# Patient Record
Sex: Female | Born: 1935 | Race: White | Hispanic: No | Smoking: Never smoker
Health system: Southern US, Community
[De-identification: ages and names within clinical notes are randomized; demographics above are authoritative.]

## PROBLEM LIST (undated history)

## (undated) DIAGNOSIS — D649 Anemia, unspecified: Secondary | ICD-10-CM

## (undated) DIAGNOSIS — E669 Obesity, unspecified: Secondary | ICD-10-CM

## (undated) DIAGNOSIS — I82402 Acute embolism and thrombosis of unspecified deep veins of left lower extremity: Secondary | ICD-10-CM

## (undated) DIAGNOSIS — C801 Malignant (primary) neoplasm, unspecified: Secondary | ICD-10-CM

## (undated) DIAGNOSIS — I781 Nevus, non-neoplastic: Secondary | ICD-10-CM

## (undated) DIAGNOSIS — N200 Calculus of kidney: Secondary | ICD-10-CM

## (undated) DIAGNOSIS — R58 Hemorrhage, not elsewhere classified: Secondary | ICD-10-CM

## (undated) DIAGNOSIS — I10 Essential (primary) hypertension: Secondary | ICD-10-CM

## (undated) DIAGNOSIS — M109 Gout, unspecified: Secondary | ICD-10-CM

## (undated) DIAGNOSIS — F329 Major depressive disorder, single episode, unspecified: Secondary | ICD-10-CM

## (undated) DIAGNOSIS — F32A Depression, unspecified: Secondary | ICD-10-CM

## (undated) DIAGNOSIS — K219 Gastro-esophageal reflux disease without esophagitis: Secondary | ICD-10-CM

## (undated) DIAGNOSIS — I823 Embolism and thrombosis of renal vein: Secondary | ICD-10-CM

## (undated) DIAGNOSIS — E875 Hyperkalemia: Secondary | ICD-10-CM

## (undated) DIAGNOSIS — E7800 Pure hypercholesterolemia, unspecified: Secondary | ICD-10-CM

## (undated) DIAGNOSIS — I739 Peripheral vascular disease, unspecified: Secondary | ICD-10-CM

## (undated) DIAGNOSIS — M1990 Unspecified osteoarthritis, unspecified site: Secondary | ICD-10-CM

## (undated) HISTORY — PX: TONSILLECTOMY: SUR1361

## (undated) HISTORY — PX: RIGHT OOPHORECTOMY: SHX2359

## (undated) HISTORY — PX: HEMORRHOID SURGERY: SHX153

## (undated) HISTORY — PX: EYE SURGERY: SHX253

## (undated) HISTORY — DX: Calculus of kidney: N20.0

## (undated) HISTORY — DX: Hemorrhage, not elsewhere classified: R58

## (undated) HISTORY — PX: ECTOPIC PREGNANCY SURGERY: SHX613

## (undated) HISTORY — DX: Pure hypercholesterolemia, unspecified: E78.00

## (undated) HISTORY — PX: OTHER SURGICAL HISTORY: SHX169

## (undated) HISTORY — PX: DENTAL SURGERY: SHX609

## (undated) HISTORY — DX: Gout, unspecified: M10.9

## (undated) HISTORY — PX: SKIN CANCER EXCISION: SHX779

## (undated) HISTORY — DX: Embolism and thrombosis of renal vein: I82.3

## (undated) HISTORY — PX: CARDIAC CATHETERIZATION: SHX172

---

## 2005-03-26 ENCOUNTER — Ambulatory Visit: Payer: Self-pay | Admitting: Physician Assistant

## 2005-03-31 ENCOUNTER — Other Ambulatory Visit: Payer: Self-pay

## 2005-04-23 ENCOUNTER — Inpatient Hospital Stay: Payer: Self-pay | Admitting: Unknown Physician Specialty

## 2005-05-06 ENCOUNTER — Other Ambulatory Visit: Payer: Self-pay

## 2005-05-06 ENCOUNTER — Inpatient Hospital Stay: Payer: Self-pay | Admitting: Internal Medicine

## 2005-07-07 ENCOUNTER — Ambulatory Visit: Payer: Self-pay | Admitting: Physician Assistant

## 2005-07-18 ENCOUNTER — Ambulatory Visit: Payer: Self-pay | Admitting: Unknown Physician Specialty

## 2006-05-28 ENCOUNTER — Ambulatory Visit: Payer: Self-pay | Admitting: Gastroenterology

## 2007-08-05 ENCOUNTER — Ambulatory Visit: Payer: Self-pay | Admitting: Physician Assistant

## 2007-08-19 HISTORY — PX: FRACTURE SURGERY: SHX138

## 2007-09-14 ENCOUNTER — Ambulatory Visit: Payer: Self-pay | Admitting: Physician Assistant

## 2007-09-14 ENCOUNTER — Ambulatory Visit: Payer: Self-pay | Admitting: Internal Medicine

## 2007-10-28 ENCOUNTER — Ambulatory Visit: Payer: Self-pay | Admitting: Unknown Physician Specialty

## 2007-12-24 ENCOUNTER — Ambulatory Visit: Payer: Self-pay | Admitting: Internal Medicine

## 2007-12-31 ENCOUNTER — Ambulatory Visit: Payer: Self-pay | Admitting: Internal Medicine

## 2008-01-03 ENCOUNTER — Ambulatory Visit: Payer: Self-pay | Admitting: Internal Medicine

## 2008-07-18 ENCOUNTER — Ambulatory Visit: Payer: Self-pay | Admitting: Internal Medicine

## 2008-08-11 ENCOUNTER — Inpatient Hospital Stay: Payer: Self-pay | Admitting: Orthopedic Surgery

## 2008-08-17 ENCOUNTER — Encounter: Payer: Self-pay | Admitting: Internal Medicine

## 2008-08-18 ENCOUNTER — Ambulatory Visit: Payer: Self-pay | Admitting: Internal Medicine

## 2008-08-18 ENCOUNTER — Encounter: Payer: Self-pay | Admitting: Internal Medicine

## 2008-11-27 ENCOUNTER — Ambulatory Visit: Payer: Self-pay | Admitting: Orthopedic Surgery

## 2008-11-28 ENCOUNTER — Ambulatory Visit: Payer: Self-pay | Admitting: Orthopedic Surgery

## 2008-12-06 ENCOUNTER — Ambulatory Visit: Payer: Self-pay | Admitting: Orthopedic Surgery

## 2008-12-06 ENCOUNTER — Inpatient Hospital Stay: Payer: Self-pay | Admitting: Internal Medicine

## 2009-01-03 ENCOUNTER — Emergency Department: Payer: Self-pay | Admitting: Internal Medicine

## 2009-01-08 ENCOUNTER — Inpatient Hospital Stay: Payer: Self-pay | Admitting: Vascular Surgery

## 2010-01-04 ENCOUNTER — Ambulatory Visit: Payer: Self-pay | Admitting: Ophthalmology

## 2010-01-16 ENCOUNTER — Ambulatory Visit: Payer: Self-pay | Admitting: Ophthalmology

## 2010-08-15 ENCOUNTER — Ambulatory Visit: Payer: Self-pay | Admitting: Ophthalmology

## 2010-08-26 ENCOUNTER — Ambulatory Visit: Payer: Self-pay | Admitting: Ophthalmology

## 2011-12-17 ENCOUNTER — Ambulatory Visit: Payer: Self-pay | Admitting: Unknown Physician Specialty

## 2011-12-29 ENCOUNTER — Other Ambulatory Visit: Payer: Self-pay | Admitting: Neurosurgery

## 2011-12-30 ENCOUNTER — Encounter (HOSPITAL_COMMUNITY): Payer: Self-pay | Admitting: *Deleted

## 2011-12-30 NOTE — Progress Notes (Signed)
I asked patient to bring a list of medications to the hospital.

## 2011-12-31 ENCOUNTER — Encounter (HOSPITAL_COMMUNITY): Payer: Self-pay | Admitting: Critical Care Medicine

## 2011-12-31 ENCOUNTER — Ambulatory Visit (HOSPITAL_COMMUNITY): Payer: Medicare Other | Admitting: Critical Care Medicine

## 2011-12-31 ENCOUNTER — Encounter (HOSPITAL_COMMUNITY): Payer: Self-pay | Admitting: *Deleted

## 2011-12-31 ENCOUNTER — Encounter (HOSPITAL_COMMUNITY)
Admission: RE | Disposition: A | Discharge status: Home / Self Care | Payer: Self-pay | Source: Ambulatory Visit | Attending: Neurosurgery

## 2011-12-31 ENCOUNTER — Ambulatory Visit (HOSPITAL_COMMUNITY): Payer: Medicare Other

## 2011-12-31 ENCOUNTER — Ambulatory Visit (HOSPITAL_COMMUNITY)
Admission: RE | Admit: 2011-12-31 | Discharge: 2012-01-01 | Disposition: A | Discharge status: Home / Self Care | Payer: Medicare Other | Source: Ambulatory Visit | Attending: Neurosurgery | Admitting: Neurosurgery

## 2011-12-31 DIAGNOSIS — F329 Major depressive disorder, single episode, unspecified: Secondary | ICD-10-CM | POA: Insufficient documentation

## 2011-12-31 DIAGNOSIS — M8448XA Pathological fracture, other site, initial encounter for fracture: Secondary | ICD-10-CM | POA: Insufficient documentation

## 2011-12-31 DIAGNOSIS — Z23 Encounter for immunization: Secondary | ICD-10-CM | POA: Insufficient documentation

## 2011-12-31 DIAGNOSIS — F3289 Other specified depressive episodes: Secondary | ICD-10-CM | POA: Insufficient documentation

## 2011-12-31 DIAGNOSIS — K219 Gastro-esophageal reflux disease without esophagitis: Secondary | ICD-10-CM | POA: Insufficient documentation

## 2011-12-31 DIAGNOSIS — M810 Age-related osteoporosis without current pathological fracture: Secondary | ICD-10-CM | POA: Insufficient documentation

## 2011-12-31 DIAGNOSIS — I10 Essential (primary) hypertension: Secondary | ICD-10-CM | POA: Insufficient documentation

## 2011-12-31 DIAGNOSIS — M129 Arthropathy, unspecified: Secondary | ICD-10-CM | POA: Insufficient documentation

## 2011-12-31 DIAGNOSIS — D649 Anemia, unspecified: Secondary | ICD-10-CM | POA: Insufficient documentation

## 2011-12-31 HISTORY — DX: Major depressive disorder, single episode, unspecified: F32.9

## 2011-12-31 HISTORY — DX: Unspecified osteoarthritis, unspecified site: M19.90

## 2011-12-31 HISTORY — DX: Anemia, unspecified: D64.9

## 2011-12-31 HISTORY — DX: Essential (primary) hypertension: I10

## 2011-12-31 HISTORY — DX: Acute embolism and thrombosis of unspecified deep veins of left lower extremity: I82.402

## 2011-12-31 HISTORY — DX: Malignant (primary) neoplasm, unspecified: C80.1

## 2011-12-31 HISTORY — PX: VERTEBROPLASTY: SHX113

## 2011-12-31 HISTORY — DX: Gastro-esophageal reflux disease without esophagitis: K21.9

## 2011-12-31 HISTORY — DX: Depression, unspecified: F32.A

## 2011-12-31 LAB — SURGICAL PCR SCREEN: Staphylococcus aureus: NEGATIVE

## 2011-12-31 LAB — CBC
MCV: 96.6 fL (ref 78.0–100.0)
Platelets: 179 10*3/uL (ref 150–400)
RBC: 3.5 MIL/uL — ABNORMAL LOW (ref 3.87–5.11)
RDW: 13.6 % (ref 11.5–15.5)
WBC: 5.2 10*3/uL (ref 4.0–10.5)

## 2011-12-31 LAB — BASIC METABOLIC PANEL
CO2: 27 mEq/L (ref 19–32)
Calcium: 9.7 mg/dL (ref 8.4–10.5)
Chloride: 96 mEq/L (ref 96–112)
GFR calc Af Amer: 55 mL/min — ABNORMAL LOW (ref >90)
Sodium: 134 mEq/L — ABNORMAL LOW (ref 135–145)

## 2011-12-31 SURGERY — VERTEBROPLASTY
Anesthesia: General | Site: Back | Wound class: Clean

## 2011-12-31 MED ORDER — ONDANSETRON HCL 4 MG/2ML IJ SOLN
INTRAMUSCULAR | Status: DC | PRN
  Administered 2011-12-31: 4 mg via INTRAVENOUS

## 2011-12-31 MED ORDER — FUROSEMIDE 20 MG PO TABS
20.0000 mg | ORAL_TABLET | Freq: Every day | ORAL | Status: DC
  Administered 2012-01-01: 20 mg via ORAL
  Filled 2011-12-31 (×2): qty 1

## 2011-12-31 MED ORDER — HYDROMORPHONE HCL PF 1 MG/ML IJ SOLN
INTRAMUSCULAR | Status: AC
  Filled 2011-12-31: qty 1

## 2011-12-31 MED ORDER — MUPIROCIN 2 % EX OINT
TOPICAL_OINTMENT | Freq: Once | CUTANEOUS | Status: AC
  Administered 2011-12-31: 10:00:00 via NASAL

## 2011-12-31 MED ORDER — MUPIROCIN 2 % EX OINT
TOPICAL_OINTMENT | CUTANEOUS | Status: AC
  Filled 2011-12-31: qty 22

## 2011-12-31 MED ORDER — RAMIPRIL 10 MG PO CAPS
10.0000 mg | ORAL_CAPSULE | Freq: Every day | ORAL | Status: DC
  Administered 2011-12-31 – 2012-01-01 (×2): 10 mg via ORAL
  Filled 2011-12-31 (×2): qty 1

## 2011-12-31 MED ORDER — CEFAZOLIN SODIUM 1-5 GM-% IV SOLN
INTRAVENOUS | Status: AC
  Filled 2011-12-31: qty 50

## 2011-12-31 MED ORDER — GLYCOPYRROLATE 0.2 MG/ML IJ SOLN
INTRAMUSCULAR | Status: DC | PRN
  Administered 2011-12-31: .6 mg via INTRAVENOUS

## 2011-12-31 MED ORDER — HYDROCODONE-ACETAMINOPHEN 5-325 MG PO TABS
ORAL_TABLET | ORAL | Status: AC
  Administered 2011-12-31: 1 via ORAL
  Filled 2011-12-31: qty 1

## 2011-12-31 MED ORDER — 0.9 % SODIUM CHLORIDE (POUR BTL) OPTIME
TOPICAL | Status: DC | PRN
  Administered 2011-12-31: 1000 mL

## 2011-12-31 MED ORDER — MUPIROCIN 2 % EX OINT: TOPICAL_OINTMENT | Freq: Two times a day (BID) | CUTANEOUS | Status: DC

## 2011-12-31 MED ORDER — NEOSTIGMINE METHYLSULFATE 1 MG/ML IJ SOLN
INTRAMUSCULAR | Status: DC | PRN
  Administered 2011-12-31: 3 mg via INTRAVENOUS

## 2011-12-31 MED ORDER — IOHEXOL 300 MG/ML  SOLN
INTRAMUSCULAR | Status: DC | PRN
  Administered 2011-12-31: 50 mL

## 2011-12-31 MED ORDER — HYDROCODONE-ACETAMINOPHEN 5-325 MG PO TABS
1.0000 | ORAL_TABLET | Freq: Once | ORAL | Status: AC
  Administered 2011-12-31: 1 via ORAL

## 2011-12-31 MED ORDER — LACTATED RINGERS IV SOLN
INTRAVENOUS | Status: DC | PRN
  Administered 2011-12-31 (×2): via INTRAVENOUS

## 2011-12-31 MED ORDER — HYDROMORPHONE HCL PF 1 MG/ML IJ SOLN
0.2500 mg | INTRAMUSCULAR | Status: DC | PRN
  Administered 2011-12-31 (×4): 0.25 mg via INTRAVENOUS

## 2011-12-31 MED ORDER — CEFAZOLIN SODIUM 1-5 GM-% IV SOLN
1.0000 g | INTRAVENOUS | Status: AC
  Administered 2011-12-31: 1 g via INTRAVENOUS

## 2011-12-31 MED ORDER — POTASSIUM CHLORIDE IN NACL 20-0.9 MEQ/L-% IV SOLN
INTRAVENOUS | Status: DC
  Filled 2011-12-31 (×3): qty 1000

## 2011-12-31 MED ORDER — LIDOCAINE-EPINEPHRINE 0.5-1:200000 % IJ SOLN
INTRAMUSCULAR | Status: DC | PRN
  Administered 2011-12-31: 5 mL

## 2011-12-31 MED ORDER — PANTOPRAZOLE SODIUM 40 MG PO TBEC
40.0000 mg | DELAYED_RELEASE_TABLET | Freq: Every day | ORAL | Status: DC
  Administered 2011-12-31 – 2012-01-01 (×2): 40 mg via ORAL
  Filled 2011-12-31 (×2): qty 1

## 2011-12-31 MED ORDER — LORAZEPAM 2 MG/ML IJ SOLN: 1.0000 mg | Freq: Once | INTRAMUSCULAR | Status: DC | PRN

## 2011-12-31 MED ORDER — MORPHINE SULFATE 2 MG/ML IJ SOLN
2.0000 mg | INTRAMUSCULAR | Status: DC | PRN
  Administered 2011-12-31: 1 mg via INTRAVENOUS
  Administered 2012-01-01: 2 mg via INTRAVENOUS
  Filled 2011-12-31 (×2): qty 1

## 2011-12-31 MED ORDER — HYDROCODONE-ACETAMINOPHEN 5-325 MG PO TABS
1.0000 | ORAL_TABLET | ORAL | Status: DC | PRN
  Administered 2012-01-01 (×2): 1 via ORAL
  Filled 2011-12-31 (×2): qty 1

## 2011-12-31 SURGICAL SUPPLY — 38 items
BLADE SURG 15 STRL LF DISP TIS (BLADE) ×1 IMPLANT
BLADE SURG 15 STRL SS (BLADE) ×1
BLADE SURG ROTATE 9660 (MISCELLANEOUS) IMPLANT
CEMENT BONE KYPHX HV R (Orthopedic Implant) ×2 IMPLANT
CLOTH BEACON ORANGE TIMEOUT ST (SAFETY) ×2 IMPLANT
CONT SPEC 4OZ CLIKSEAL STRL BL (MISCELLANEOUS) ×2 IMPLANT
DERMABOND ADVANCED (GAUZE/BANDAGES/DRESSINGS) ×1
DERMABOND ADVANCED .7 DNX12 (GAUZE/BANDAGES/DRESSINGS) ×1 IMPLANT
DRAPE C-ARM 42X72 X-RAY (DRAPES) ×2 IMPLANT
DRAPE LAPAROTOMY 100X72X124 (DRAPES) ×2 IMPLANT
DRAPE PROXIMA HALF (DRAPES) ×2 IMPLANT
DRAPE SURG 17X23 STRL (DRAPES) ×2 IMPLANT
DRESSING TELFA 8X3 (GAUZE/BANDAGES/DRESSINGS) IMPLANT
DURAPREP 26ML APPLICATOR (WOUND CARE) ×2 IMPLANT
GAUZE SPONGE 4X4 16PLY XRAY LF (GAUZE/BANDAGES/DRESSINGS) ×2 IMPLANT
GLOVE BIOGEL PI IND STRL 7.5 (GLOVE) ×2 IMPLANT
GLOVE BIOGEL PI INDICATOR 7.5 (GLOVE) ×2
GLOVE ECLIPSE 6.5 STRL STRAW (GLOVE) ×2 IMPLANT
GLOVE ECLIPSE 7.5 STRL STRAW (GLOVE) ×6 IMPLANT
GLOVE EXAM NITRILE LRG STRL (GLOVE) IMPLANT
GLOVE EXAM NITRILE MD LF STRL (GLOVE) IMPLANT
GLOVE EXAM NITRILE XL STR (GLOVE) IMPLANT
GLOVE EXAM NITRILE XS STR PU (GLOVE) IMPLANT
GOWN BRE IMP SLV AUR LG STRL (GOWN DISPOSABLE) ×4 IMPLANT
GOWN BRE IMP SLV AUR XL STRL (GOWN DISPOSABLE) IMPLANT
GOWN STRL REIN 2XL LVL4 (GOWN DISPOSABLE) IMPLANT
KIT BASIN OR (CUSTOM PROCEDURE TRAY) ×2 IMPLANT
KIT ROOM TURNOVER OR (KITS) ×2 IMPLANT
NEEDLE HYPO 25X1 1.5 SAFETY (NEEDLE) ×2 IMPLANT
NS IRRIG 1000ML POUR BTL (IV SOLUTION) ×2 IMPLANT
PACK SURGICAL SETUP 50X90 (CUSTOM PROCEDURE TRAY) ×2 IMPLANT
PAD ARMBOARD 7.5X6 YLW CONV (MISCELLANEOUS) ×6 IMPLANT
SPECIMEN JAR SMALL (MISCELLANEOUS) IMPLANT
STAPLER SKIN PROX WIDE 3.9 (STAPLE) ×2 IMPLANT
SUT VIC AB 3-0 SH 8-18 (SUTURE) ×2 IMPLANT
SYR CONTROL 10ML LL (SYRINGE) ×4 IMPLANT
TOWEL OR 17X24 6PK STRL BLUE (TOWEL DISPOSABLE) ×2 IMPLANT
TOWEL OR 17X26 10 PK STRL BLUE (TOWEL DISPOSABLE) ×2 IMPLANT

## 2011-12-31 NOTE — Progress Notes (Addendum)
Patient has healing ulcer on right lower leg. Being followed by Dr. Orson Aloe in [place]. No drainage or open areas noted. Primary physician - Dr.Charlene Lorin Picket - EKG requested asap Cardiologist - Dr. Hazle Nordmann with [facility] in [place]. EKG, Stress Test, Cardiac Cath within last 5 years - records requested asap.  EKG and cardiac notes reviewed by Shonna Chock PA. No new orders received.   Kayla Galloway

## 2011-12-31 NOTE — Anesthesia Procedure Notes (Signed)
Procedure Name: Intubation Date/Time: 12/31/2011 4:29 PM Performed by: Gwenyth Allegra Pre-anesthesia Checklist: Patient identified, Timeout performed, Emergency Drugs available and Suction available Patient Re-evaluated:Patient Re-evaluated prior to inductionOxygen Delivery Method: Circle system utilized Preoxygenation: Pre-oxygenation with 100% oxygen Intubation Type: IV induction Ventilation: Oral airway inserted - appropriate to patient size Laryngoscope Size: Mac and 4 Grade View: Grade II Tube type: Oral Tube size: 7.0 mm Number of attempts: 1 Airway Equipment and Method: Stylet Placement Confirmation: ETT inserted through vocal cords under direct vision,  breath sounds checked- equal and bilateral and positive ETCO2 Secured at: 21 cm Tube secured with: Tape Dental Injury: Teeth and Oropharynx as per pre-operative assessment

## 2011-12-31 NOTE — Transfer of Care (Signed)
Immediate Anesthesia Transfer of Care Note  Patient: Kayla Galloway  Procedure(s) Performed: Procedure(s) (LRB): VERTEBROPLASTY (N/A)  Patient Location: PACU  Anesthesia Type: General  Level of Consciousness: awake and alert   Airway & Oxygen Therapy: Patient Spontanous Breathing and Patient connected to nasal cannula oxygen  Post-op Assessment: Report given to PACU RN and Post -op Vital signs reviewed and stable  Post vital signs: Reviewed and stable  Complications: No apparent anesthesia complications

## 2011-12-31 NOTE — Anesthesia Postprocedure Evaluation (Signed)
  Anesthesia Post-op Note  Patient: Kayla Galloway  Procedure(s) Performed: Procedure(s) (LRB): VERTEBROPLASTY (N/A)  Patient Location: PACU  Anesthesia Type: General  Level of Consciousness: awake, alert  and oriented  Airway and Oxygen Therapy: Patient Spontanous Breathing and Patient connected to nasal cannula oxygen  Post-op Pain: mild  Post-op Assessment: Post-op Vital signs reviewed, Patient's Cardiovascular Status Stable, Respiratory Function Stable, Patent Airway, No signs of Nausea or vomiting and Pain level controlled  Post-op Vital Signs: Reviewed and stable  Complications: No apparent anesthesia complications

## 2011-12-31 NOTE — H&P (Signed)
Kayla Galloway is an 78 y.o. female.   Chief Complaint: Back pain, T11 compression fracture HPI: pain in lower back since 2/24 when she heard a pop in her back. Conservative treatment unhelpful. Mri showed a subacute compression fracture at T11.  Past Medical History  Diagnosis Date  . Hypertension   . Arthritis   . Osteoporosis   . Blood transfusion   . Anemia   . GERD (gastroesophageal reflux disease)   . DVT (deep venous thrombosis), left     bil  . Depression   . Cancer     head    Past Surgical History  Procedure Date  . Fracture surgery     hip  . Right oophorectomy     partial  . Eye surgery     bil eyes  . Cardiac catheterization   . Colonsopy   . Tonsillectomy   . Skin cancer excision     top of head  . Ivc filter     pt states it has turned side ways and could not be removed.    Family History  Problem Relation Age of Onset  . Anesthesia problems Neg Hx    Social History:  reports that she has never smoked. She does not have any smokeless tobacco history on file. She reports that she does not drink alcohol or use illicit drugs.  Allergies:  Allergies  Allergen Reactions  . Doxycycline Nausea Only    Unable to eat, weight loss.  . Tramadol     Medications Prior to Admission  Medication Sig Dispense Refill  . Calcium Carbonate-Vitamin D (CALCIUM 600 + D PO) Take 1 tablet by mouth 3 (three) times daily.      . Cyanocobalamin (VITAMIN B-12 IJ) Inject 1,000 mcg as directed every 30 (thirty) days.      . furosemide (LASIX) 20 MG tablet Take 20 mg by mouth daily.      Marland Kitchen HYDROcodone-acetaminophen (NORCO) 5-325 MG per tablet Take 1 tablet by mouth every 4 (four) hours as needed. For pain      . omeprazole (PRILOSEC) 20 MG capsule Take 20 mg by mouth daily.      Marland Kitchen OVER THE COUNTER MEDICATION Take 1 tablet by mouth 2 (two) times daily. Osteo Bi-flex      . ramipril (ALTACE) 10 MG capsule Take 10 mg by mouth daily.        Results for orders placed during  the hospital encounter of 12/31/11 (from the past 48 hour(s))  BASIC METABOLIC PANEL     Status: Abnormal   Collection Time   12/31/11  9:09 AM      Component Value Range Comment   Sodium 134 (*) 135 - 145 (mEq/L)    Potassium 4.1  3.5 - 5.1 (mEq/L)    Chloride 96  96 - 112 (mEq/L)    CO2 27  19 - 32 (mEq/L)    Glucose, Bld 98  70 - 99 (mg/dL)    BUN 16  6 - 23 (mg/dL)    Creatinine, Ser 2.95  0.50 - 1.10 (mg/dL)    Calcium 9.7  8.4 - 10.5 (mg/dL)    GFR calc non Af Amer 48 (*) >90 (mL/min)    GFR calc Af Amer 55 (*) >90 (mL/min)   CBC     Status: Abnormal   Collection Time   12/31/11  9:09 AM      Component Value Range Comment   WBC 5.2  4.0 - 10.5 (K/uL)  RBC 3.50 (*) 3.87 - 5.11 (MIL/uL)    Hemoglobin 11.4 (*) 12.0 - 15.0 (g/dL)    HCT 16.1 (*) 09.6 - 46.0 (%)    MCV 96.6  78.0 - 100.0 (fL)    MCH 32.6  26.0 - 34.0 (pg)    MCHC 33.7  30.0 - 36.0 (g/dL)    RDW 04.5  40.9 - 81.1 (%)    Platelets 179  150 - 400 (K/uL)   SURGICAL PCR SCREEN     Status: Normal   Collection Time   12/31/11  9:10 AM      Component Value Range Comment   MRSA, PCR NEGATIVE  NEGATIVE     Staphylococcus aureus NEGATIVE  NEGATIVE     Dg Chest 2 View  12/31/2011  *RADIOLOGY REPORT*  Clinical Data: Preoperative respiratory evaluation.  CHEST - 2 VIEW  Comparison: None.  Findings: Hyperexpansion is consistent with emphysema. The lungs are clear without focal infiltrate, edema, pneumothorax or pleural effusion.  Pleuroparenchymal scarring is noted in both apices. The cardiopericardial silhouette is enlarged.  Multiple thoracic compression fractures are identified.  Evidence of prior vertebral augmentation noted at L2.  There is moderate compression deformity at L1.  Moderate to severe compression deformities seen at T11 and T12.  Moderate compression deformity noted at T7 and T10.  IMPRESSION: Cardiomegaly with emphysema.  No acute cardiopulmonary process.  Multilevel compression fractures in the thoracolumbar  spine.  Original Report Authenticated By: ERIC A. MANSELL, M.D.    ROS  Blood pressure 156/76, pulse 79, temperature 97.3 F (36.3 C), temperature source Oral, resp. rate 16, height 5' 2.5" (1.588 m), weight 52.164 kg (115 lb), SpO2 99.00%. Physical Exam alert and oriented x 4 Moving all extremities, no focal weakness Cranial nerves, decreased hearing bilaterally Symmetric facies Tongue and uvula midline   Assessment/Plan Or for kyphoplasty of T11  Haylo Fake L 12/31/2011, 4:23 PM

## 2011-12-31 NOTE — Preoperative (Signed)
Beta Blockers   Reason not to administer Beta Blockers:Not Applicable 

## 2011-12-31 NOTE — Op Note (Signed)
12/31/2011  5:42 PM  PATIENT:  Sharlyne Pacas  78 y.o. female with severe back pain which I believe is due to a T11 compression fracture  PRE-OPERATIVE DIAGNOSIS:  T11 compression fracture  POST-OPERATIVE DIAGNOSIS:  T11 compression fracturePROCEDURE:  Procedure(s): Kyphoplasty T11  SURGEON:  Surgeon(s): Carmela Hurt, MD  ASSISTANTS:none  ANESTHESIA:   general  EBL:  Total I/O In: 1000 [I.V.:1000] Out: -   BLOOD ADMINISTERED:none  CELL SAVER GIVEN:none  COUNT:per nursing  DRAINS: none   SPECIMEN:  No Specimen  DICTATION: Mrs. Streed was taken to the operating room and intubated and placed under a general anesthetic without difficulty. She was positioned on the [place] table prone with all pressure points properly padded. I localized the T11 vertebrae with fluoroscopy before prepping to make sure the fluoroscopy units were in the correct position. I prepped and draped Mrs. Muralles in a sterile manner. I with fluoroscopic guidance placed a trocar into the left pedicle of the T11 body via a stab incision. I was able to advance it through very soft bone past midline. I placed the kyphoplasty ballon and inflated it to create a cavity to accept the methylmethracylate. I placed the cement and achieved a good fill under fluoroscopic guidance. I removed the trocar and the final xray looked like there was no adverse placement. I used one stitch to close the stab hole I used for the trocar. I used dermabond for a sterile dressing.  PLAN OF CARE: Admit for overnight observation  PATIENT DISPOSITION:  PACU - hemodynamically stable.   Delay start of Pharmacological VTE agent (>24hrs) due to surgical blood loss or risk of bleeding:  yes

## 2011-12-31 NOTE — Anesthesia Preprocedure Evaluation (Signed)
Anesthesia Evaluation  Patient identified by MRN, date of birth, ID band Patient awake    Reviewed: Allergy & Precautions, H&P , NPO status , Patient's Chart, lab work & pertinent test results  Airway Mallampati: I TM Distance: >3 FB Neck ROM: Full    Dental   Pulmonary    Pulmonary exam normal       Cardiovascular hypertension,     Neuro/Psych Depression    GI/Hepatic GERD-  ,  Endo/Other    Renal/GU      Musculoskeletal   Abdominal   Peds  Hematology   Anesthesia Other Findings   Reproductive/Obstetrics                           Anesthesia Physical Anesthesia Plan  ASA: II  Anesthesia Plan: General   Post-op Pain Management:    Induction: Intravenous  Airway Management Planned: Oral ETT  Additional Equipment:   Intra-op Plan:   Post-operative Plan: Extubation in OR  Informed Consent: I have reviewed the patients History and Physical, chart, labs and discussed the procedure including the risks, benefits and alternatives for the proposed anesthesia with the patient or authorized representative who has indicated his/her understanding and acceptance.     Plan Discussed with: CRNA and Surgeon  Anesthesia Plan Comments:         Anesthesia Quick Evaluation

## 2012-01-01 ENCOUNTER — Encounter (HOSPITAL_COMMUNITY): Payer: Self-pay | Admitting: Neurosurgery

## 2012-01-01 MED ORDER — PNEUMOCOCCAL VAC POLYVALENT 25 MCG/0.5ML IJ INJ
0.5000 mL | INJECTION | INTRAMUSCULAR | Status: AC
  Administered 2012-01-01: 0.5 mL via INTRAMUSCULAR
  Filled 2012-01-01: qty 0.5

## 2012-01-01 NOTE — Discharge Summary (Addendum)
Physician Discharge Summary  Patient ID: Kayla Galloway MRN: [id number] DOB/AGE: 76-Aug-1922 76 y.o.  Admit date: 12/31/2011 Discharge date: 01/01/2012  Admission Diagnoses:t11 compression fracture  Discharge Diagnoses: T11 compression fracture Active Problems:  * No active hospital problems. *    Discharged Condition: good  Hospital Course: [name] was admitted and taken to the operating room for a kyphoplasty. She has done well post procedure. She is voiding, ambulating and tolerating a regular diet. Wound is clean and dry.  Consults: None  Significant Diagnostic Studies:   Treatments: T11 kyphoplasty from the Left.  Discharge Exam: Blood pressure 142/76, pulse 68, temperature 97.5 F (36.4 C), temperature source Oral, resp. rate 16, height 5' 2.5" (1.588 m), weight 52.164 kg (115 lb), SpO2 97.00%. General appearance: alert, cooperative and appears stated age Neurologic: Alert and oriented X 3, normal strength and tone. Normal symmetric reflexes. Normal coordination and gait  Disposition: Final discharge disposition not confirmed   Medication List  As of 01/01/2012  2:52 PM   TAKE these medications         CALCIUM 600 + D PO   Take 1 tablet by mouth 3 (three) times daily.      furosemide 20 MG tablet   Commonly known as: LASIX   Take 20 mg by mouth daily.      HYDROcodone-acetaminophen 5-325 MG per tablet   Commonly known as: NORCO   Take 1 tablet by mouth every 4 (four) hours as needed. For pain      omeprazole 20 MG capsule   Commonly known as: PRILOSEC   Take 20 mg by mouth daily.      OVER THE COUNTER MEDICATION   Take 1 tablet by mouth 2 (two) times daily. Osteo Bi-flex      ramipril 10 MG capsule   Commonly known as: ALTACE   Take 10 mg by mouth daily.      VITAMIN B-12 IJ   Inject 1,000 mcg as directed every 30 (thirty) days.             Signed: Azuri Bozard L 01/01/2012, 2:52 PM

## 2012-01-01 NOTE — Discharge Instructions (Signed)
Vertebroplasty Care After A vertebroplasty is procedure that helps relieve pain caused by breaks (fractures) in the spine. A special kind of "bone cement" is put into the spine. The cement hardens quickly. It also helps the break stay in a steady state (stabilizes).  HOME CARE  Rest in your bed (bed rest) as told by your doctor.   Do not lift things over 8 pounds (3.6 kilograms) for the next few days.   Return to your normal routine as told by your doctor.   Only take medicine as told by your doctor.   Change bandages (dressings) as told by your doctor.  GET HELP RIGHT AWAY IF:   You are bleeding from the needle site.   You have puffiness (swelling) or redness at the needle site.   You are very sore near the needle site.   Yellowish white fluid (pus) is coming out of the needle site.   You have a temperature by mouth above 102 F (38.9 C), not controlled by medicine.   You have trouble breathing or you feel short of breath.   You throw up (vomit).   You feel sick to your stomach (nauseous).   You have pain or cramping in your belly (abdomen), and it will not stop.  If you go to the Emergency Room, tell the nurse that you had this procedure done. MAKE SURE YOU:  Understand these instructions.   Will watch your condition.   Will get help right away if you are not doing well or get worse.  Document Released: 10/29/2009 Document Revised: 07/24/2011 Document Reviewed: 01/08/2011 Exit[employer] Patient Information 2012 [employer]. 

## 2012-01-07 ENCOUNTER — Encounter (HOSPITAL_COMMUNITY): Payer: Self-pay

## 2012-06-28 ENCOUNTER — Ambulatory Visit (INDEPENDENT_AMBULATORY_CARE_PROVIDER_SITE_OTHER): Payer: Medicare Other | Admitting: Internal Medicine

## 2012-06-28 ENCOUNTER — Encounter: Payer: Self-pay | Admitting: Internal Medicine

## 2012-06-28 VITALS — BP 140/64 | HR 76 | Temp 98.7°F | Ht 62.75 in | Wt 116.5 lb

## 2012-06-28 DIAGNOSIS — D649 Anemia, unspecified: Secondary | ICD-10-CM

## 2012-06-28 DIAGNOSIS — E7800 Pure hypercholesterolemia, unspecified: Secondary | ICD-10-CM | POA: Insufficient documentation

## 2012-06-28 DIAGNOSIS — I10 Essential (primary) hypertension: Secondary | ICD-10-CM

## 2012-06-28 DIAGNOSIS — M810 Age-related osteoporosis without current pathological fracture: Secondary | ICD-10-CM | POA: Insufficient documentation

## 2012-06-28 DIAGNOSIS — R5383 Other fatigue: Secondary | ICD-10-CM

## 2012-06-28 DIAGNOSIS — R5381 Other malaise: Secondary | ICD-10-CM

## 2012-06-28 DIAGNOSIS — N289 Disorder of kidney and ureter, unspecified: Secondary | ICD-10-CM

## 2012-06-28 NOTE — Patient Instructions (Addendum)
It was nice seeing you today.  I am going to schedule you to return for labs. Let me know if you need anything.

## 2012-06-28 NOTE — Assessment & Plan Note (Signed)
Low cholesterol diet.  Desires no medication.  Follow.    

## 2012-06-28 NOTE — Progress Notes (Signed)
  Subjective:    Patient ID: Kayla Galloway, female    DOB: November 24, 1920, 77 y.o.   MRN: [id number]  HPI 78 year old female with past history of hypertension, gout and hypercholesterolemia.  S/P kyphoplasty x 2.  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  She also had a renal vein thrombosis with some renal insufficiency previously.  She comes in today for a scheduled follow up.  Accompanied by her sister.  History obtained from both of them.  She states she has been doing well.  Still with some increased back pain, but is moving much better.  Eating and drinking well.  No nausea or vomiting.  No bowel issues.    Past Medical History  Diagnosis Date  . Hypertension   . Arthritis   . Osteoporosis   . Hypercholesterolemia   . Anemia   . GERD (gastroesophageal reflux disease)   . DVT (deep venous thrombosis), left     bilateral, IVC filter 2010  . Depression   . Gout   . Nephrolithiasis   . 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 Renal vein thrombosis     previous renal insufficiency    Review of Systems Patient denies any headache, lightheadedness or dizziness.  No chest pain, tightness or palpitations.  No increased shortness of breath, cough or congestion.  No nausea or vomiting.  No acid reflux.  No abdominal pain or cramping.  No bowel change, such as diarrhea, constipation, BRBPR or melana.  No urine change.        Objective:   Physical Exam Filed Vitals:   06/28/12 1337  BP: 140/64  Pulse: 76  Temp: 98.7 F (70.9 C)   78 year old female in no acute distress.   HEENT:  Nares - clear.  OP- without lesions or erythema.  NECK:  Supple, nontender.  No audible bruit.   HEART:  Appears to be regular. LUNGS:  Without crackles or wheezing audible.  Respirations even and unlabored.   RADIAL PULSE:  Equal bilaterally.  ABDOMEN:  Soft, nontender.  No audible abdominal  bruit.   EXTREMITIES:  No increased edema to be present.                     Assessment & Plan:  MSK.  Seeing Dr Franky Macho.  Has follow up in 12/13.  Stable.    CARDIOVASCULAR.  Stress test 10/25/10 negative with EF 60%.  Saw cardiology.  Continue risk factor modification.   INCREASED PSYCHOSOCIAL STRESSORS.  Started on Zoloft 03/11/12.  Doing well.  Follow.   GYN.  Has seen Dr Harold Hedge.  Was supposed to follow up with her.  She declines any further w/up.    HEALTH MAINTENANCE.  Physical 12/08/11.  Colonoscopy 10/07 - normal.  She declines mammogram and further testing.

## 2012-06-28 NOTE — Assessment & Plan Note (Signed)
Bone density 06/30/08 revealed interval decrease.  Received IV Reclast in March 2011.  Continue calcium and vitamin D.  Desires not to receive Reclast.   

## 2012-06-28 NOTE — Assessment & Plan Note (Signed)
Cr has been stable at 1.1.  Follow.     

## 2012-06-28 NOTE — Assessment & Plan Note (Signed)
Blood pressure has been under reasonable control.  Follow.  Check metabolic panel.  

## 2012-06-28 NOTE — Assessment & Plan Note (Signed)
Hgb has been stable.  Still with elevated ferritin.  Hemochromatosis screening negative.  Have discussed hematology evaluation.  She has declined.  Colonoscopy 10/07 was normal.  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  She desires no further testing at this point.  Follow cbc.

## 2012-07-12 ENCOUNTER — Other Ambulatory Visit (INDEPENDENT_AMBULATORY_CARE_PROVIDER_SITE_OTHER): Payer: Medicare Other

## 2012-07-12 DIAGNOSIS — E7800 Pure hypercholesterolemia, unspecified: Secondary | ICD-10-CM

## 2012-07-12 DIAGNOSIS — R5383 Other fatigue: Secondary | ICD-10-CM

## 2012-07-12 DIAGNOSIS — R5381 Other malaise: Secondary | ICD-10-CM

## 2012-07-12 DIAGNOSIS — D649 Anemia, unspecified: Secondary | ICD-10-CM

## 2012-07-12 LAB — COMPREHENSIVE METABOLIC PANEL
AST: 29 U/L (ref 0–37)
Albumin: 4.1 g/dL (ref 3.5–5.2)
BUN: 20 mg/dL (ref 6–23)
CO2: 27 mEq/L (ref 19–32)
Calcium: 9.5 mg/dL (ref 8.4–10.5)
Chloride: 99 mEq/L (ref 96–112)
Glucose, Bld: 139 mg/dL — ABNORMAL HIGH (ref 70–99)
Potassium: 3.6 mEq/L (ref 3.5–5.1)

## 2012-07-12 LAB — FERRITIN: Ferritin: 338 ng/mL — ABNORMAL HIGH (ref 10.0–291.0)

## 2012-07-12 LAB — CBC WITH DIFFERENTIAL/PLATELET
Basophils Absolute: 0 10*3/uL (ref 0.0–0.1)
Eosinophils Absolute: 0 10*3/uL (ref 0.0–0.7)
HCT: 32.9 % — ABNORMAL LOW (ref 36.0–46.0)
Hemoglobin: 11 g/dL — ABNORMAL LOW (ref 12.0–15.0)
Lymphs Abs: 1.8 10*3/uL (ref 0.7–4.0)
MCHC: 33.3 g/dL (ref 30.0–36.0)
Monocytes Absolute: 0.5 10*3/uL (ref 0.1–1.0)
Neutro Abs: 3 10*3/uL (ref 1.4–7.7)
RDW: 15.1 % — ABNORMAL HIGH (ref 11.5–14.6)

## 2012-07-19 ENCOUNTER — Other Ambulatory Visit: Payer: Self-pay | Admitting: Internal Medicine

## 2012-07-19 DIAGNOSIS — D696 Thrombocytopenia, unspecified: Secondary | ICD-10-CM

## 2012-07-19 DIAGNOSIS — R739 Hyperglycemia, unspecified: Secondary | ICD-10-CM

## 2012-07-19 NOTE — Progress Notes (Signed)
I have placed order for labs.  Pt coming 07/26/12 at 9:00.  Please put on lab schedule.  Thanks.

## 2012-07-26 ENCOUNTER — Other Ambulatory Visit: Payer: Medicare Other

## 2012-07-29 ENCOUNTER — Other Ambulatory Visit (INDEPENDENT_AMBULATORY_CARE_PROVIDER_SITE_OTHER): Payer: Medicare Other

## 2012-07-29 DIAGNOSIS — R739 Hyperglycemia, unspecified: Secondary | ICD-10-CM

## 2012-07-29 DIAGNOSIS — D696 Thrombocytopenia, unspecified: Secondary | ICD-10-CM

## 2012-07-29 DIAGNOSIS — R7309 Other abnormal glucose: Secondary | ICD-10-CM

## 2012-07-29 LAB — CBC WITH DIFFERENTIAL/PLATELET
Basophils Absolute: 0 10*3/uL (ref 0.0–0.1)
Eosinophils Absolute: 0 10*3/uL (ref 0.0–0.7)
Hemoglobin: 11.2 g/dL — ABNORMAL LOW (ref 12.0–15.0)
Lymphocytes Relative: 28.9 % (ref 12.0–46.0)
Lymphs Abs: 1.4 10*3/uL (ref 0.7–4.0)
MCHC: 33.4 g/dL (ref 30.0–36.0)
Neutro Abs: 2.9 10*3/uL (ref 1.4–7.7)
RDW: 14.4 % (ref 11.5–14.6)

## 2012-08-04 LAB — CBC
HGB: 11 g/dL — ABNORMAL LOW (ref 12.0–16.0)
MCH: 33.9 pg (ref 26.0–34.0)
MCV: 98 fL (ref 80–100)
Platelet: 154 10*3/uL (ref 150–440)
RBC: 3.25 10*6/uL — ABNORMAL LOW (ref 3.80–5.20)
RDW: 13.9 % (ref 11.5–14.5)
WBC: 9.6 10*3/uL (ref 3.6–11.0)

## 2012-08-04 LAB — URINALYSIS, COMPLETE
Bilirubin,UR: NEGATIVE
Blood: NEGATIVE
Glucose,UR: NEGATIVE mg/dL (ref 0–75)
Ketone: NEGATIVE
Leukocyte Esterase: NEGATIVE
Nitrite: NEGATIVE
Ph: 6 (ref 4.5–8.0)
RBC,UR: <1 /HPF (ref 0–5)
Squamous Epithelial: NONE SEEN
WBC UR: 1 /HPF (ref 0–5)

## 2012-08-04 LAB — COMPREHENSIVE METABOLIC PANEL
Bilirubin,Total: 0.5 mg/dL (ref 0.2–1.0)
Chloride: 96 mmol/L — ABNORMAL LOW (ref 98–107)
EGFR (African American): 43 — ABNORMAL LOW
EGFR (Non-African Amer.): 37 — ABNORMAL LOW
Glucose: 90 mg/dL (ref 65–99)
Osmolality: 258 (ref 275–301)
Potassium: 4.3 mmol/L (ref 3.5–5.1)
SGPT (ALT): 22 U/L (ref 12–78)
Total Protein: 6.8 g/dL (ref 6.4–8.2)

## 2012-08-05 ENCOUNTER — Inpatient Hospital Stay: Payer: Self-pay | Admitting: Internal Medicine

## 2012-08-05 LAB — WBCS, STOOL

## 2012-08-05 LAB — HEMOGLOBIN: HGB: 9.7 g/dL — ABNORMAL LOW (ref 12.0–16.0)

## 2012-08-06 LAB — CBC WITH DIFFERENTIAL/PLATELET
Basophil #: 0 10*3/uL (ref 0.0–0.1)
Basophil %: 0.3 %
HGB: 9.8 g/dL — ABNORMAL LOW (ref 12.0–16.0)
MCH: 33.6 pg (ref 26.0–34.0)
MCHC: 34.7 g/dL (ref 32.0–36.0)
MCV: 97 fL (ref 80–100)
Monocyte #: 0.5 x10 3/mm (ref 0.2–0.9)
Monocyte %: 8.1 %
Neutrophil #: 4.5 10*3/uL (ref 1.4–6.5)
Neutrophil %: 67 %
RBC: 2.91 10*6/uL — ABNORMAL LOW (ref 3.80–5.20)
WBC: 6.8 10*3/uL (ref 3.6–11.0)

## 2012-08-06 LAB — SODIUM: Sodium: 131 mmol/L — ABNORMAL LOW (ref 136–145)

## 2012-08-08 LAB — STOOL CULTURE

## 2012-08-09 ENCOUNTER — Ambulatory Visit: Payer: Medicare Other | Admitting: Adult Health

## 2012-08-10 ENCOUNTER — Other Ambulatory Visit: Payer: Self-pay | Admitting: Internal Medicine

## 2012-08-10 MED ORDER — SERTRALINE HCL 25 MG PO TABS: 25.0000 mg | ORAL_TABLET | Freq: Every day | ORAL | Status: DC

## 2012-08-10 NOTE — Telephone Encounter (Signed)
Sent in to pharmacy.  

## 2012-08-10 NOTE — Telephone Encounter (Signed)
Sertraline HCL 50 mg tab  Take one tablet by mouth once daily  # 30

## 2012-08-12 ENCOUNTER — Telehealth: Payer: Self-pay | Admitting: Internal Medicine

## 2012-08-12 NOTE — Telephone Encounter (Signed)
Patient Information:  Caller Name: Dorsie  Phone: [phone]  Patient: Breta, Demedeiros  Gender: Female  DOB: August 04, 1921  Age: 78 Years  PCP: [facility]  Office Follow Up:  Does the office need to follow up with this patient?: No  Instructions For The Office: N/A  RN Note:  Pain rated 8-9 out of 10 with pain medication and corset.  Symptoms  Reason For Call & Symptoms: Called for referral for supspected ruptured disc.  Reports severe upper back pain left side.  History of Kyphoplasty X 3. Asking for referral for treatment.  Denies chest pain.  Reviewed Health History In EMR: Yes  Reviewed Medications In EMR: Yes  Reviewed Allergies In EMR: Yes  Reviewed Surgeries / Procedures: N/A  Date of Onset of Symptoms: 08/10/2012  Treatments Tried: corset, hydrocodone for pain  Treatments Tried Worked: Yes  Guideline(s) Used:  Back Pain  Disposition Per Guideline:   Go to ED Now (or to Office with PCP Approval)  Reason For Disposition Reached:   Sudden onset of severe back pain and age > 89  Advice Given:  N/A  RN Overrode Recommendation:  Go To ED  [facility]

## 2012-08-15 ENCOUNTER — Encounter: Payer: Self-pay | Admitting: Internal Medicine

## 2012-08-23 ENCOUNTER — Telehealth: Payer: Self-pay | Admitting: Internal Medicine

## 2012-08-23 NOTE — Telephone Encounter (Signed)
Is she having any other acute symptoms - along with the leg swelling .  If sob, chest pain, etc - needs eval - acute care or er (pending sx).  If no acute symptoms - I can see her Wednesday - at 12:00.  Work in for this problem.  Have her monitor her salt intake and elevate legs if sitting.

## 2012-08-23 NOTE — Telephone Encounter (Signed)
Please advise 

## 2012-08-23 NOTE — Telephone Encounter (Signed)
Patient notified.  Appointment scheduled.

## 2012-08-23 NOTE — Telephone Encounter (Signed)
He ankles are swollen twice the they should be wants to know if she should double up on he fluid pill . She wants a call from Dr. Lorin Picket.

## 2012-08-24 ENCOUNTER — Ambulatory Visit: Payer: Self-pay | Admitting: Neurosurgery

## 2012-08-25 ENCOUNTER — Ambulatory Visit: Payer: Medicare Other | Admitting: Internal Medicine

## 2012-08-25 ENCOUNTER — Telehealth: Payer: Self-pay | Admitting: Internal Medicine

## 2012-08-25 NOTE — Telephone Encounter (Signed)
FYI  Pt left message this morning wanting to r/s her appointment.  Pt wanted to r/s to 09/22/12.  Pt stated she had mri done yesterday and has an appointment with dr Ovid Curd in [place] tomorrow

## 2012-08-26 ENCOUNTER — Other Ambulatory Visit: Payer: Self-pay | Admitting: Neurosurgery

## 2012-08-26 ENCOUNTER — Encounter (HOSPITAL_COMMUNITY): Payer: Self-pay

## 2012-08-27 ENCOUNTER — Inpatient Hospital Stay (HOSPITAL_COMMUNITY): Admission: RE | Admit: 2012-08-27 | Payer: Medicare Other | Source: Ambulatory Visit | Admitting: Neurosurgery

## 2012-08-27 ENCOUNTER — Encounter (HOSPITAL_COMMUNITY): Admission: RE | Payer: Self-pay | Source: Ambulatory Visit

## 2012-08-27 ENCOUNTER — Other Ambulatory Visit: Payer: Self-pay | Admitting: Neurosurgery

## 2012-08-27 SURGERY — KYPHOPLASTY
Anesthesia: General

## 2012-08-30 ENCOUNTER — Encounter (HOSPITAL_COMMUNITY): Payer: Self-pay | Admitting: Respiratory Therapy

## 2012-09-02 ENCOUNTER — Encounter (HOSPITAL_COMMUNITY)
Admission: RE | Admit: 2012-09-02 | Discharge: 2012-09-02 | Disposition: A | Discharge status: Home / Self Care | Payer: Medicare Other | Source: Ambulatory Visit | Attending: Neurosurgery | Admitting: Neurosurgery

## 2012-09-02 ENCOUNTER — Encounter (HOSPITAL_COMMUNITY): Payer: Self-pay

## 2012-09-02 HISTORY — DX: Peripheral vascular disease, unspecified: I73.9

## 2012-09-02 HISTORY — DX: Nevus, non-neoplastic: I78.1

## 2012-09-02 LAB — BASIC METABOLIC PANEL
BUN: 18 mg/dL (ref 6–23)
CO2: 23 mEq/L (ref 19–32)
Calcium: 9.3 mg/dL (ref 8.4–10.5)
Chloride: 95 mEq/L — ABNORMAL LOW (ref 96–112)
Creatinine, Ser: 1.1 mg/dL (ref 0.50–1.10)
Glucose, Bld: 95 mg/dL (ref 70–99)

## 2012-09-02 LAB — SURGICAL PCR SCREEN: Staphylococcus aureus: NEGATIVE

## 2012-09-02 LAB — CBC
HCT: 27.3 % — ABNORMAL LOW (ref 36.0–46.0)
MCH: 32.4 pg (ref 26.0–34.0)
MCV: 95.1 fL (ref 78.0–100.0)
Platelets: 212 10*3/uL (ref 150–400)
RDW: 13.4 % (ref 11.5–15.5)

## 2012-09-02 MED ORDER — CEFAZOLIN SODIUM-DEXTROSE 2-3 GM-% IV SOLR
2.0000 g | INTRAVENOUS | Status: AC
  Administered 2012-09-03: 2 g via INTRAVENOUS
  Filled 2012-09-02: qty 50

## 2012-09-02 NOTE — Progress Notes (Signed)
Pt has seen Dr Anders Simmonds at [facility].  I faxed a request for last office notes, EKG, 2D Echo, Stress test and cardiac cath.

## 2012-09-02 NOTE — Pre-Procedure Instructions (Addendum)
Kayla Galloway  09/02/2012   Your procedure is scheduled on:  Friday, January 17th.  Report to [facility] at 5:30 AM.  Call this number if you have problems the morning of surgery: [phone]   Remember:   Do not eat food or drink liquids after midnight.    Take these medicines the morning of surgery with A SIP OF WATER: Omeprazole (Prilosec), Sertaline (Zoloft).  Tylenol if needed.  Stop taking Aspirin, Coumadin, Plavix, Effient and herbal medications.  Do not take any NSAIDS ie:  Ibuprofen, Advil, Naproxen.   Do not wear jewelry, make-up or nail polish.  Do not wear lotions, powders, or perfumes. You may wear deodorant.  Do not shave 48 hours prior to surgery. Men may shave face and neck.  Do not bring valuables to the hospital.  Contacts, dentures or bridgework may not be worn into surgery.  Leave suitcase in the car. After surgery it may be brought to your room.  For patients admitted to the hospital, checkout time is 11:00 AM the day of  discharge.   Patients discharged the day of surgery will not be allowed to drive  home.  Name and phone number of your driver: ___              Shower with CHG wash (Bactoshield) tonight and again in the am prior to arriving to hospital.   Please read over the following fact sheets that you were given: Pain Booklet, Coughing and Deep Breathing and Surgical Site Infection Prevention

## 2012-09-03 ENCOUNTER — Ambulatory Visit (HOSPITAL_COMMUNITY)
Admission: RE | Admit: 2012-09-03 | Discharge: 2012-09-04 | Disposition: A | Discharge status: Home / Self Care | Payer: Medicare Other | Source: Ambulatory Visit | Attending: Neurosurgery | Admitting: Neurosurgery

## 2012-09-03 ENCOUNTER — Inpatient Hospital Stay (HOSPITAL_COMMUNITY): Payer: Medicare Other

## 2012-09-03 ENCOUNTER — Inpatient Hospital Stay (HOSPITAL_COMMUNITY): Payer: Medicare Other | Admitting: Anesthesiology

## 2012-09-03 ENCOUNTER — Encounter (HOSPITAL_COMMUNITY): Payer: Self-pay | Admitting: *Deleted

## 2012-09-03 ENCOUNTER — Encounter (HOSPITAL_COMMUNITY)
Admission: RE | Disposition: A | Discharge status: Home / Self Care | Payer: Self-pay | Source: Ambulatory Visit | Attending: Neurosurgery

## 2012-09-03 ENCOUNTER — Encounter (HOSPITAL_COMMUNITY): Payer: Self-pay | Admitting: Anesthesiology

## 2012-09-03 DIAGNOSIS — Z01812 Encounter for preprocedural laboratory examination: Secondary | ICD-10-CM | POA: Insufficient documentation

## 2012-09-03 DIAGNOSIS — I10 Essential (primary) hypertension: Secondary | ICD-10-CM | POA: Insufficient documentation

## 2012-09-03 DIAGNOSIS — M8448XA Pathological fracture, other site, initial encounter for fracture: Principal | ICD-10-CM | POA: Insufficient documentation

## 2012-09-03 HISTORY — PX: KYPHOPLASTY: SHX5884

## 2012-09-03 SURGERY — KYPHOPLASTY
Anesthesia: General | Site: Back | Wound class: Clean

## 2012-09-03 MED ORDER — ONDANSETRON HCL 4 MG/2ML IJ SOLN
INTRAMUSCULAR | Status: DC | PRN
  Administered 2012-09-03: 4 mg via INTRAVENOUS

## 2012-09-03 MED ORDER — NEOSTIGMINE METHYLSULFATE 1 MG/ML IJ SOLN
INTRAMUSCULAR | Status: DC | PRN
  Administered 2012-09-03: 2 mg via INTRAVENOUS

## 2012-09-03 MED ORDER — PROMETHAZINE HCL 25 MG/ML IJ SOLN: 6.2500 mg | INTRAMUSCULAR | Status: DC | PRN

## 2012-09-03 MED ORDER — MENTHOL 3 MG MT LOZG: 1.0000 | LOZENGE | OROMUCOSAL | Status: DC | PRN

## 2012-09-03 MED ORDER — POTASSIUM CHLORIDE IN NACL 20-0.9 MEQ/L-% IV SOLN: INTRAVENOUS | Status: DC

## 2012-09-03 MED ORDER — HYDROMORPHONE HCL PF 1 MG/ML IJ SOLN
0.2500 mg | INTRAMUSCULAR | Status: DC | PRN
  Administered 2012-09-03 (×2): 0.5 mg via INTRAVENOUS

## 2012-09-03 MED ORDER — PANTOPRAZOLE SODIUM 40 MG PO TBEC
40.0000 mg | DELAYED_RELEASE_TABLET | Freq: Every day | ORAL | Status: DC
  Administered 2012-09-03 – 2012-09-04 (×2): 40 mg via ORAL
  Filled 2012-09-03 (×2): qty 1

## 2012-09-03 MED ORDER — MORPHINE SULFATE 2 MG/ML IJ SOLN: 1.0000 mg | INTRAMUSCULAR | Status: DC | PRN

## 2012-09-03 MED ORDER — ACETAMINOPHEN 325 MG PO TABS: 650.0000 mg | ORAL_TABLET | ORAL | Status: DC | PRN

## 2012-09-03 MED ORDER — ONDANSETRON HCL 4 MG/2ML IJ SOLN: 4.0000 mg | INTRAMUSCULAR | Status: DC | PRN

## 2012-09-03 MED ORDER — ACETAMINOPHEN 650 MG RE SUPP: 650.0000 mg | RECTAL | Status: DC | PRN

## 2012-09-03 MED ORDER — LIDOCAINE HCL (CARDIAC) 20 MG/ML IV SOLN
INTRAVENOUS | Status: DC | PRN
  Administered 2012-09-03: 100 mg via INTRAVENOUS

## 2012-09-03 MED ORDER — PROPOFOL 10 MG/ML IV BOLUS
INTRAVENOUS | Status: DC | PRN
  Administered 2012-09-03: 20 mg via INTRAVENOUS
  Administered 2012-09-03: 120 mg via INTRAVENOUS

## 2012-09-03 MED ORDER — PHENOL 1.4 % MT LIQD: 1.0000 | OROMUCOSAL | Status: DC | PRN

## 2012-09-03 MED ORDER — POTASSIUM CHLORIDE IN NACL 20-0.9 MEQ/L-% IV SOLN
INTRAVENOUS | Status: DC
  Filled 2012-09-03 (×3): qty 1000

## 2012-09-03 MED ORDER — SODIUM CHLORIDE 0.9 % IJ SOLN: 3.0000 mL | INTRAMUSCULAR | Status: DC | PRN

## 2012-09-03 MED ORDER — SENNA 8.6 MG PO TABS
1.0000 | ORAL_TABLET | Freq: Two times a day (BID) | ORAL | Status: DC
  Administered 2012-09-03 – 2012-09-04 (×2): 8.6 mg via ORAL
  Filled 2012-09-03 (×3): qty 1

## 2012-09-03 MED ORDER — HYDROMORPHONE HCL PF 1 MG/ML IJ SOLN
INTRAMUSCULAR | Status: AC
  Filled 2012-09-03: qty 1

## 2012-09-03 MED ORDER — ROCURONIUM BROMIDE 100 MG/10ML IV SOLN
INTRAVENOUS | Status: DC | PRN
  Administered 2012-09-03: 5 mg via INTRAVENOUS
  Administered 2012-09-03: 25 mg via INTRAVENOUS
  Administered 2012-09-03: 5 mg via INTRAVENOUS

## 2012-09-03 MED ORDER — FUROSEMIDE 20 MG PO TABS
20.0000 mg | ORAL_TABLET | Freq: Every day | ORAL | Status: DC
Start: 2012-09-03 — End: 2012-09-04
  Administered 2012-09-03 – 2012-09-04 (×2): 20 mg via ORAL
  Filled 2012-09-03 (×2): qty 1

## 2012-09-03 MED ORDER — FENTANYL CITRATE 0.05 MG/ML IJ SOLN
INTRAMUSCULAR | Status: DC | PRN
  Administered 2012-09-03: 50 ug via INTRAVENOUS

## 2012-09-03 MED ORDER — RAMIPRIL 10 MG PO CAPS
10.0000 mg | ORAL_CAPSULE | Freq: Every day | ORAL | Status: DC
  Administered 2012-09-04: 10 mg via ORAL
  Filled 2012-09-03 (×2): qty 1

## 2012-09-03 MED ORDER — SERTRALINE HCL 50 MG PO TABS
50.0000 mg | ORAL_TABLET | Freq: Every day | ORAL | Status: DC
  Administered 2012-09-03 – 2012-09-04 (×2): 50 mg via ORAL
  Filled 2012-09-03 (×2): qty 1

## 2012-09-03 MED ORDER — LIDOCAINE-EPINEPHRINE 0.5 %-1:200000 IJ SOLN
INTRAMUSCULAR | Status: DC | PRN
  Administered 2012-09-03: 4 mL

## 2012-09-03 MED ORDER — GLYCOPYRROLATE 0.2 MG/ML IJ SOLN
INTRAMUSCULAR | Status: DC | PRN
  Administered 2012-09-03: 0.3 mg via INTRAVENOUS

## 2012-09-03 MED ORDER — LACTATED RINGERS IV SOLN
INTRAVENOUS | Status: DC | PRN
  Administered 2012-09-03: 07:00:00 via INTRAVENOUS

## 2012-09-03 MED ORDER — EPHEDRINE SULFATE 50 MG/ML IJ SOLN
INTRAMUSCULAR | Status: DC | PRN
  Administered 2012-09-03: 10 mg via INTRAVENOUS

## 2012-09-03 MED ORDER — ARTIFICIAL TEARS OP OINT
TOPICAL_OINTMENT | OPHTHALMIC | Status: DC | PRN
  Administered 2012-09-03: 1 via OPHTHALMIC

## 2012-09-03 MED ORDER — SODIUM CHLORIDE 0.9 % IJ SOLN
3.0000 mL | Freq: Two times a day (BID) | INTRAMUSCULAR | Status: DC
  Administered 2012-09-03 (×2): 3 mL via INTRAVENOUS

## 2012-09-03 MED ORDER — HYDROCODONE-ACETAMINOPHEN 5-325 MG PO TABS
1.0000 | ORAL_TABLET | ORAL | Status: DC | PRN
  Administered 2012-09-03: 1 via ORAL
  Administered 2012-09-03: 2 via ORAL
  Administered 2012-09-03: 1 via ORAL
  Administered 2012-09-04: 2 via ORAL
  Filled 2012-09-03: qty 2
  Filled 2012-09-03: qty 1
  Filled 2012-09-03: qty 2
  Filled 2012-09-03: qty 1

## 2012-09-03 MED ORDER — 0.9 % SODIUM CHLORIDE (POUR BTL) OPTIME
TOPICAL | Status: DC | PRN
  Administered 2012-09-03: 1000 mL

## 2012-09-03 MED ORDER — CYCLOBENZAPRINE HCL 10 MG PO TABS
10.0000 mg | ORAL_TABLET | Freq: Three times a day (TID) | ORAL | Status: DC | PRN
  Administered 2012-09-03: 10 mg via ORAL
  Filled 2012-09-03: qty 1

## 2012-09-03 SURGICAL SUPPLY — 42 items
BLADE SURG 15 STRL LF DISP TIS (BLADE) ×1 IMPLANT
BLADE SURG 15 STRL SS (BLADE) ×1
BLADE SURG ROTATE 9660 (MISCELLANEOUS) IMPLANT
CEMENT BONE KYPHX HV R (Orthopedic Implant) ×2 IMPLANT
CLOTH BEACON ORANGE TIMEOUT ST (SAFETY) ×2 IMPLANT
CONT SPEC 4OZ CLIKSEAL STRL BL (MISCELLANEOUS) ×4 IMPLANT
DERMABOND ADHESIVE PROPEN (GAUZE/BANDAGES/DRESSINGS) ×1
DERMABOND ADVANCED (GAUZE/BANDAGES/DRESSINGS)
DERMABOND ADVANCED .7 DNX12 (GAUZE/BANDAGES/DRESSINGS) IMPLANT
DERMABOND ADVANCED .7 DNX6 (GAUZE/BANDAGES/DRESSINGS) ×1 IMPLANT
DRAPE C-ARM 42X72 X-RAY (DRAPES) ×2 IMPLANT
DRAPE LAPAROTOMY 100X72X124 (DRAPES) ×2 IMPLANT
DRAPE PROXIMA HALF (DRAPES) ×2 IMPLANT
DRAPE SURG 17X23 STRL (DRAPES) ×2 IMPLANT
DRESSING TELFA 8X3 (GAUZE/BANDAGES/DRESSINGS) IMPLANT
DURAPREP 26ML APPLICATOR (WOUND CARE) ×2 IMPLANT
GAUZE SPONGE 4X4 16PLY XRAY LF (GAUZE/BANDAGES/DRESSINGS) ×2 IMPLANT
GLOVE BIOGEL PI IND STRL 7.0 (GLOVE) ×2 IMPLANT
GLOVE BIOGEL PI INDICATOR 7.0 (GLOVE) ×2
GLOVE ECLIPSE 6.5 STRL STRAW (GLOVE) ×2 IMPLANT
GLOVE EXAM NITRILE LRG STRL (GLOVE) IMPLANT
GLOVE EXAM NITRILE MD LF STRL (GLOVE) IMPLANT
GLOVE EXAM NITRILE XL STR (GLOVE) IMPLANT
GLOVE EXAM NITRILE XS STR PU (GLOVE) IMPLANT
GLOVE SS BIOGEL STRL SZ 6.5 (GLOVE) ×2 IMPLANT
GLOVE SUPERSENSE BIOGEL SZ 6.5 (GLOVE) ×2
GOWN BRE IMP SLV AUR LG STRL (GOWN DISPOSABLE) ×4 IMPLANT
GOWN BRE IMP SLV AUR XL STRL (GOWN DISPOSABLE) IMPLANT
GOWN STRL REIN 2XL LVL4 (GOWN DISPOSABLE) IMPLANT
KIT BASIN OR (CUSTOM PROCEDURE TRAY) ×2 IMPLANT
KIT ROOM TURNOVER OR (KITS) ×2 IMPLANT
NEEDLE HYPO 25X1 1.5 SAFETY (NEEDLE) ×2 IMPLANT
NS IRRIG 1000ML POUR BTL (IV SOLUTION) ×2 IMPLANT
PACK SURGICAL SETUP 50X90 (CUSTOM PROCEDURE TRAY) ×2 IMPLANT
PAD ARMBOARD 7.5X6 YLW CONV (MISCELLANEOUS) ×6 IMPLANT
SPECIMEN JAR SMALL (MISCELLANEOUS) IMPLANT
STAPLER SKIN PROX WIDE 3.9 (STAPLE) IMPLANT
SUT VIC AB 3-0 SH 8-18 (SUTURE) ×2 IMPLANT
SYR CONTROL 10ML LL (SYRINGE) ×4 IMPLANT
TOWEL OR 17X24 6PK STRL BLUE (TOWEL DISPOSABLE) ×2 IMPLANT
TOWEL OR 17X26 10 PK STRL BLUE (TOWEL DISPOSABLE) ×2 IMPLANT
TRAY KYPHOPAK 15/3 ONESTEP 1ST (MISCELLANEOUS) ×2 IMPLANT

## 2012-09-03 NOTE — Preoperative (Signed)
Beta Blockers   Reason not to administer Beta Blockers:Not Applicable 

## 2012-09-03 NOTE — Anesthesia Postprocedure Evaluation (Signed)
Anesthesia Post Note  Patient: Kayla Galloway  Procedure(s) Performed: Procedure(s) (LRB): KYPHOPLASTY (N/A)  Anesthesia type: general  Patient location: PACU  Post pain: Pain level controlled  Post assessment: Patient's Cardiovascular Status Stable  Last Vitals:  Filed Vitals:   09/03/12 1023  BP:   Pulse: 68  Temp: 36.3 C  Resp: 16    Post vital signs: Reviewed and stable  Level of consciousness: sedated  Complications: No apparent anesthesia complications

## 2012-09-03 NOTE — Anesthesia Preprocedure Evaluation (Addendum)
Anesthesia Evaluation  Patient identified by MRN, date of birth, ID band Patient awake    Reviewed: Allergy & Precautions, H&P , NPO status , Patient's Chart, lab work & pertinent test results  Airway Mallampati: II TM Distance: >3 FB Neck ROM: Full    Dental  (+) Teeth Intact and Dental Advisory Given   Pulmonary neg pulmonary ROS,    Pulmonary exam normal       Cardiovascular hypertension, Pt. on medications + Peripheral Vascular Disease     Neuro/Psych Depression negative neurological ROS     GI/Hepatic GERD-  Medicated,  Endo/Other  negative endocrine ROS  Renal/GU Renal InsufficiencyRenal disease     Musculoskeletal   Abdominal   Peds  Hematology negative hematology ROS (+)   Anesthesia Other Findings   Reproductive/Obstetrics                           Anesthesia Physical Anesthesia Plan  ASA: III  Anesthesia Plan: General   Post-op Pain Management:    Induction: Intravenous  Airway Management Planned: Oral ETT  Additional Equipment:   Intra-op Plan:   Post-operative Plan: Extubation in OR  Informed Consent: I have reviewed the patients History and Physical, chart, labs and discussed the procedure including the risks, benefits and alternatives for the proposed anesthesia with the patient or authorized representative who has indicated his/her understanding and acceptance.   Dental advisory given  Plan Discussed with: CRNA, Anesthesiologist and Surgeon  Anesthesia Plan Comments:         Anesthesia Quick Evaluation

## 2012-09-03 NOTE — Anesthesia Procedure Notes (Signed)
Procedure Name: Intubation Date/Time: 09/03/2012 8:29 AM Performed by: Jefm Miles E Pre-anesthesia Checklist: Patient identified, Timeout performed, Emergency Drugs available, Suction available and Patient being monitored Patient Re-evaluated:Patient Re-evaluated prior to inductionOxygen Delivery Method: Circle system utilized Preoxygenation: Pre-oxygenation with 100% oxygen Intubation Type: IV induction Ventilation: Mask ventilation without difficulty Laryngoscope Size: Mac and 3 Grade View: Grade I Tube type: Oral Tube size: 7.0 mm Number of attempts: 1 Airway Equipment and Method: Stylet Placement Confirmation: ETT inserted through vocal cords under direct vision,  positive ETCO2 and breath sounds checked- equal and bilateral Secured at: 22 cm Tube secured with: Tape Dental Injury: Teeth and Oropharynx as per pre-operative assessment

## 2012-09-03 NOTE — Op Note (Signed)
09/03/2012  9:26 AM  PATIENT:  Sharlyne Pacas  79 y.o. female  PRE-OPERATIVE DIAGNOSIS:  Thoracic eight FRACTURE  POST-OPERATIVE DIAGNOSIS:  Thoracic eight FRACTURE  PROCEDURE:  Procedure(s): KYPHOPLASTY t8  SURGEON:  Surgeon(s): Carmela Hurt, MD  ASSISTANTS:none  ANESTHESIA:   general  EBL:     BLOOD ADMINISTERED:none  CELL SAVER GIVEN:none  COUNT:per nursing  DRAINS: none   SPECIMEN:  No Specimen  DICTATION: Mrs. Fickett was taken to the operating room, intubated and placed under general anesthetic without difficulty. She was positioned prone on the bed with all pressure points padded. Her back was prepped and draped in a sterile manner. I used fluoroscopy to localize the pedicles of T8 bilaterally. I made a small stab incision bilaterally to place the cannulas and localized their position with xray. I drilled the pedicle and vertebral body, then placed the kyphoplasty balloon. I was able from the right side to achieve some reduction of the fracture. I at that time tried to fill just from the right side. I did place approximately 1.25 cc of methylmethacrylate from the right side. However it looked like some of the cement might be backing up toward the pedicle. I then used the left cannula and placed more cement. Final films showed a good fill without breach of the cortical surfaces. I removed the cannulas bilaterally. I closed the stab incisions with vicryl sutures. I used dermabond for A sterile dressing  PLAN OF CARE: Discharge to home after PACU  PATIENT DISPOSITION:  PACU - hemodynamically stable.   Delay start of Pharmacological VTE agent (>24hrs) due to surgical blood loss or risk of bleeding:  yes

## 2012-09-03 NOTE — Transfer of Care (Signed)
Immediate Anesthesia Transfer of Care Note  Patient: Kayla Galloway  Procedure(s) Performed: Procedure(s) (LRB) with comments: KYPHOPLASTY (N/A) - Thoracic eight Kyphoplasty  Patient Location: PACU  Anesthesia Type:General  Level of Consciousness: patient cooperative, lethargic and responds to stimulation  Airway & Oxygen Therapy: Patient Spontanous Breathing and Patient connected to nasal cannula oxygen  Post-op Assessment: Report given to PACU RN  Post vital signs: Reviewed and stable  Complications: No apparent anesthesia complications

## 2012-09-03 NOTE — H&P (Signed)
BP 188/94  Pulse 67  Temp 97.3 F (36.3 C) (Oral)  Resp 18  SpO2 100% Kayla Galloway returns today.  Dr. Jeral Fruit saw her during my absence and had her go for an MRI of the thoracic and lumbar spine because she came in complaining of significant pain in her back.  He thought that there was a high chance that she had a compression fracture.  Indeed what she has is a new compression fracture within the T8 vertebral body.  In the lumbar spine she does not have any new fractures and there was no change.  Neurologically she is intact.  She is moving slowly though moving normally.  Allergies  Allergen Reactions  . Doxycycline Nausea Only    Unable to eat, weight loss.  . Advil (Ibuprofen) Swelling  . Celebrex (Celecoxib) Nausea Only  . Daypro (Oxaprozin) Other (See Comments)    Dizziness   . Etodolac Nausea Only  . Percocet (Oxycodone-Acetaminophen) Other (See Comments)    Nausea and stomach swelling  . Tramadol   . Valium (Diazepam) Other (See Comments)    Dizziness   . Neosporin (Neomycin-Bacitracin Zn-Polymyx) Rash   Past Surgical History  Procedure Date  . Fracture surgery 2009    hip  . Right oophorectomy     partial-  . Eye surgery     bil eyes  . Colonsopy   . Tonsillectomy     age 22  . Skin cancer excision     top of head  . Ivc filter     pt states it has turned side ways and could not be removed.  . Vertebroplasty 12/31/2011    Procedure: VERTEBROPLASTY;  Surgeon: Carmela Hurt, MD;  Location: [facility];  Service: Neurosurgery;  Laterality: N/A;  Thoracic eleven vertebroplasty  . Ectopic pregnancy surgery   . Hemorrhoid surgery   . Cardiac catheterization     2010 Does not see a cardiac  doctor  . Dental surgery    Past Medical History  Diagnosis Date  . Arthritis   . Osteoporosis   . Hypercholesterolemia   . Anemia   . GERD (gastroesophageal reflux disease)   . DVT (deep venous thrombosis), left     bilateral, IVC filter 2010  . Depression   . Gout   .  Nephrolithiasis   . 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 Renal vein thrombosis     previous renal insufficiency  . Hypertension     Dr. Dale [place]  . Cancer     skin ca lesion of scalp- removed  . Peripheral vascular disease   . Spider veins    Prior to Admission medications   Medication Sig Start Date End Date Taking? Authorizing Provider  acetaminophen (TYLENOL) 650 MG CR tablet Take 650 mg by mouth every 8 (eight) hours as needed.   Yes Historical Provider, MD  Calcium Carbonate-Vitamin D (CALCIUM 600 + D PO) Take 1 tablet by mouth 3 (three) times daily.   Yes Historical Provider, MD  Cyanocobalamin (VITAMIN B-12 IJ) Inject 1,000 mcg as directed every 30 (thirty) days.   Yes Historical Provider, MD  furosemide (LASIX) 20 MG tablet Take 20 mg by mouth daily.   Yes Historical Provider, MD  HYDROcodone-acetaminophen (NORCO/VICODIN) 5-325 MG per tablet Take 1 tablet by mouth every 8 (eight) hours as needed.   Yes Historical Provider, MD  omeprazole (PRILOSEC) 20 MG capsule Take 20 mg by mouth daily.  Yes Historical Provider, MD  OVER THE COUNTER MEDICATION Take 1 tablet by mouth 2 (two) times daily. Osteo Bi-flex   Yes Historical Provider, MD  ramipril (ALTACE) 10 MG capsule Take 10 mg by mouth daily.   Yes Historical Provider, MD  sertraline (ZOLOFT) 50 MG tablet Take 50 mg by mouth daily.   Yes Historical Provider, MD   History   Social History  . Marital Status: Widowed    Spouse Name: N/A    Number of Children: 0  . Years of Education: N/A   Occupational History  . Not on file.   Social History Main Topics  . Smoking status: Never Smoker   . Smokeless tobacco: Never Used  . Alcohol Use: No  . Drug Use: No  . Sexually Active: Not on file   Other Topics Concern  . Not on file   Social History Narrative  . No narrative on file   Family History  Problem Relation Age of Onset  . Anesthesia problems Neg Hx   . Heart disease  Father     myocardial infarction  . Heart disease Brother     myocardial infarction  . Lymphoma Sister   . Breast cancer Neg Hx   . Colon cancer Neg Hx    Physical Exam  Constitutional: She is oriented to person, place, and time. She appears well-developed and well-nourished. She appears distressed.  HENT:  Head: Normocephalic and atraumatic.  Right Ear: External ear normal.  Left Ear: External ear normal.  Mouth/Throat: Oropharynx is clear and moist.  Eyes: Conjunctivae normal and EOM are normal. Pupils are equal, round, and reactive to light.  Neck: Normal range of motion. Neck supple.  Cardiovascular: Normal rate, regular rhythm, normal heart sounds and intact distal pulses.   Pulmonary/Chest: Effort normal and breath sounds normal.  Abdominal: Soft. Bowel sounds are normal.  Musculoskeletal: Normal range of motion.  Neurological: She is alert and oriented to person, place, and time. She has normal reflexes. She displays normal reflexes. No cranial nerve deficit. She exhibits normal muscle tone. Coordination normal.  Skin: Skin is dry.  Psychiatric: She has a normal mood and affect. Her behavior is normal. Judgment and thought content normal.     Mrs. Antwine has a new T8 compression fracture. She is a good candidate for a Kyphoplasty with an excellent chance of pain relief.   Risks and benefits are well understood as she just underwent this procedure at [facility] in May of this year.

## 2012-09-03 NOTE — Plan of Care (Signed)
Problem: Consults Goal: Diagnosis - Spinal Surgery Outcome: Completed/Met Date Met:  09/03/12 T8 KYPHOPLASTY

## 2012-09-03 NOTE — Discharge Summary (Signed)
Physician Discharge Summary  Patient ID: [name] MRN: [id number] DOB/AGE: [birth year] 77 y.o.  Admit date: 09/03/2012 Discharge date: 09/03/2012  Admission Diagnoses:T8 compression fracture  Discharge Diagnoses: T8 compression fracture Active Problems:  * No active hospital problems. *    Discharged Condition: good  Hospital Course: Kayla Galloway is a 79 y.o. female whom underwent a kyphoplasty without complication. She has improved pain at discharge. Neurologically intact with full strength in lower extremities   Consults: None  Significant Diagnostic Studies: none  Treatments: surgery: Kyphoplasty  Discharge Exam: Blood pressure 147/77, pulse 70, temperature 97.6 F (36.4 C), temperature source Oral, resp. rate 18, SpO2 96.00%. General appearance: alert, cooperative, appears stated age, cachectic and mild distress Neurologic: Alert and oriented X 3, normal strength and tone. Normal symmetric reflexes. Normal coordination and gait Incision/Wound:Clean, dry and without signs of infection  Disposition: 01-Home or Self Care  Discharge Orders    Future Appointments: Provider: Department: Dept Phone: Center:   09/22/2012 9:15 AM Charm Barges, MD [facility] [place] [phone] None   12/28/2012 3:30 PM Charm Barges, MD [facility] [place] [phone] None       Medication List     As of 09/03/2012  8:32 PM    TAKE these medications         acetaminophen 650 MG CR tablet   Commonly known as: TYLENOL   Take 650 mg by mouth every 8 (eight) hours as needed.      CALCIUM 600 + D PO   Take 1 tablet by mouth 3 (three) times daily.      furosemide 20 MG tablet   Commonly known as: LASIX   Take 20 mg by mouth daily.      HYDROcodone-acetaminophen 5-325 MG per tablet   Commonly known as: NORCO/VICODIN   Take 1 tablet by mouth every 8 (eight) hours as needed.      omeprazole 20 MG capsule   Commonly known as: PRILOSEC   Take 20  mg by mouth daily.      OVER THE COUNTER MEDICATION   Take 1 tablet by mouth 2 (two) times daily. Osteo Bi-flex      ramipril 10 MG capsule   Commonly known as: ALTACE   Take 10 mg by mouth daily.      sertraline 50 MG tablet   Commonly known as: ZOLOFT   Take 50 mg by mouth daily.      VITAMIN B-12 IJ   Inject 1,000 mcg as directed every 30 (thirty) days.         Signed: Avia Merkley L 09/03/2012, 8:32 PM

## 2012-09-06 ENCOUNTER — Encounter (HOSPITAL_COMMUNITY): Payer: Self-pay | Admitting: Neurosurgery

## 2012-09-22 ENCOUNTER — Ambulatory Visit: Payer: Medicare Other | Admitting: Internal Medicine

## 2012-09-27 ENCOUNTER — Ambulatory Visit: Payer: Medicare Other | Admitting: Internal Medicine

## 2012-09-27 ENCOUNTER — Telehealth: Payer: Self-pay | Admitting: Internal Medicine

## 2012-09-27 NOTE — Telephone Encounter (Signed)
Called to reschedule appointment for today she states that patient thinks she has done something else to her back. She doesn't feel like coming in today, I did reschedule her for Monday next week. She also needs b12 called into [facility].

## 2012-09-29 NOTE — Telephone Encounter (Signed)
Patient was notified that b-12 is on back order.

## 2012-10-04 ENCOUNTER — Other Ambulatory Visit: Payer: Self-pay | Admitting: *Deleted

## 2012-10-04 ENCOUNTER — Encounter: Payer: Self-pay | Admitting: Internal Medicine

## 2012-10-04 ENCOUNTER — Ambulatory Visit (INDEPENDENT_AMBULATORY_CARE_PROVIDER_SITE_OTHER): Payer: Medicare Other | Admitting: Internal Medicine

## 2012-10-04 VITALS — BP 118/68 | HR 80 | Temp 97.4°F | Ht 62.75 in | Wt 116.0 lb

## 2012-10-04 DIAGNOSIS — N289 Disorder of kidney and ureter, unspecified: Secondary | ICD-10-CM

## 2012-10-04 DIAGNOSIS — I10 Essential (primary) hypertension: Secondary | ICD-10-CM

## 2012-10-04 DIAGNOSIS — D649 Anemia, unspecified: Secondary | ICD-10-CM

## 2012-10-04 LAB — CBC WITH DIFFERENTIAL/PLATELET
Basophils Absolute: 0 10*3/uL (ref 0.0–0.1)
Basophils Relative: 0.1 % (ref 0.0–3.0)
Eosinophils Absolute: 0.1 10*3/uL (ref 0.0–0.7)
Lymphocytes Relative: 23.8 % (ref 12.0–46.0)
MCHC: 32.7 g/dL (ref 30.0–36.0)
MCV: 99.4 fl (ref 78.0–100.0)
Monocytes Absolute: 0.7 10*3/uL (ref 0.1–1.0)
Neutrophils Relative %: 64.4 % (ref 43.0–77.0)
Platelets: 151 10*3/uL (ref 150.0–400.0)
RBC: 2.96 Mil/uL — ABNORMAL LOW (ref 3.87–5.11)
RDW: 14.8 % — ABNORMAL HIGH (ref 11.5–14.6)

## 2012-10-04 LAB — BASIC METABOLIC PANEL
BUN: 24 mg/dL — ABNORMAL HIGH (ref 6–23)
Chloride: 100 mEq/L (ref 96–112)
GFR: 48.65 mL/min — ABNORMAL LOW (ref >60.00)
Potassium: 4.4 mEq/L (ref 3.5–5.1)
Sodium: 135 mEq/L (ref 135–145)

## 2012-10-04 LAB — FERRITIN: Ferritin: 361.3 ng/mL — ABNORMAL HIGH (ref 10.0–291.0)

## 2012-10-04 MED ORDER — RAMIPRIL 5 MG PO CAPS: 5.0000 mg | ORAL_CAPSULE | Freq: Every day | ORAL | Status: DC

## 2012-10-04 NOTE — Patient Instructions (Signed)
Stop the lasix for now.  I am going to change your altace (ramipril) to 5mg  - take one per day.  I will see you back soon.  Let me know if any problems.

## 2012-10-05 ENCOUNTER — Encounter: Payer: Self-pay | Admitting: Internal Medicine

## 2012-10-05 MED ORDER — OMEPRAZOLE 20 MG PO CPDR: 20.0000 mg | DELAYED_RELEASE_CAPSULE | Freq: Every day | ORAL | Status: DC

## 2012-10-05 NOTE — Telephone Encounter (Signed)
Sent in to pharmacy.  

## 2012-10-05 NOTE — Assessment & Plan Note (Signed)
Recheck metabolic panel today to confirm stable.

## 2012-10-05 NOTE — Progress Notes (Signed)
Subjective:    Patient ID: Kayla Galloway, female    DOB: 29-Nov-1920, 79 y.o.   MRN: [id number]  HPI 79 year old female with past history of hypertension, gout and hypercholesterolemia.  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  She also had a renal vein thrombosis with some renal insufficiency previously.  She comes in today for a scheduled follow up.  Accompanied by her sister.  History obtained from both of them.  Just recently underwent repeat kyphoplasty.  Performed by Dr Franky Macho.  States she is still having back pain.  Upper back this time.  Due follow up with him next Monday.  Taking hydrocodone.  States she is weak.  No significant dizziness.  States she may feel light headed occasionally - if she gets up to fast. Decreased energy.  No chest pain or tightness.  No increased sob.  Breathing stable.  States her bowels are doing relatively well - taking stool softeners.  No blood in the stool.   Past Medical History  Diagnosis Date  . Arthritis   . Osteoporosis   . Hypercholesterolemia   . Anemia   . GERD (gastroesophageal reflux disease)   . DVT (deep venous thrombosis), left     bilateral, IVC filter 2010  . Depression   . Gout   . Nephrolithiasis   . 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 Renal vein thrombosis     previous renal insufficiency  . Hypertension     Dr. Dale [place]  . Cancer     skin ca lesion of scalp- removed  . Peripheral vascular disease   . Spider veins     Current Outpatient Prescriptions on File Prior to Visit  Medication Sig Dispense Refill  . acetaminophen (TYLENOL) 650 MG CR tablet Take 650 mg by mouth every 8 (eight) hours as needed.      . Calcium Carbonate-Vitamin D (CALCIUM 600 + D PO) Take 1 tablet by mouth 3 (three) times daily.      . Cyanocobalamin (VITAMIN B-12 IJ) Inject 1,000 mcg as directed every 30 (thirty) days.      .  furosemide (LASIX) 20 MG tablet Take 20 mg by mouth daily.      Marland Kitchen HYDROcodone-acetaminophen (NORCO/VICODIN) 5-325 MG per tablet Take 1 tablet by mouth every 8 (eight) hours as needed.      Marland Kitchen omeprazole (PRILOSEC) 20 MG capsule Take 20 mg by mouth daily.      Marland Kitchen OVER THE COUNTER MEDICATION Take 1 tablet by mouth 2 (two) times daily. Osteo Bi-flex      . sertraline (ZOLOFT) 50 MG tablet Take 50 mg by mouth daily.       No current facility-administered medications on file prior to visit.    Review of Systems Patient denies any headache.  No significant lightheadedness or dizziness.  See above.   No chest pain, tightness or palpitations.  No increased shortness of breath, cough or congestion.  No nausea or vomiting.  Not eating much (according to her sister).  She has started back on Ensure recently.  No acid reflux.  No abdominal pain or cramping.  No bowel change, such as diarrhea, constipation, BRBPR or melana.  States the stool softeners are helping with constipation.  No urine change.  Does report decreased energy and weakness.       Objective:   Physical Exam  Filed Vitals:   10/04/12 1124  BP: 118/68  Pulse: 80  Temp: 97.4 F (36.3 C)   Blood pressure recheck:  94-96/68.  Not orthostatic on exam.   79 year old female in no acute distress.   HEENT:  Nares - clear.  OP- without lesions or erythema.  NECK:  Supple, nontender.  No audible bruit.   HEART:  Appears to be regular. LUNGS:  Without crackles or wheezing audible.  Respirations even and unlabored.   RADIAL PULSE:  Equal bilaterally.  ABDOMEN:  Soft, nontender.  No audible abdominal bruit.   EXTREMITIES:  No increased edema to be present.                     Assessment & Plan:  WEAKNESS.  See above.  Blood pressure as outlined.  Will stop lasix temporarily.  Decrease ramipril to 5mg  q day.  Follow volume status and blood pressure closely.  Get her back in soon to reassess.  She is to call or be reevaluated if any problems.   Pt comfortable with this plan.  Check cbc to confirm hgb stabe.  Also check metabolic panel.    MSK.  Seeing Dr Franky Macho.  S/p kyphoplasty.  Still with pain.  Taking hydrocodone.  Feels this is manageable.  Desires no further intervention.  Due to see him next week.    CARDIOVASCULAR.  Stress test 10/25/10 negative with EF 60%.  Saw cardiology.  Continue risk factor modification.   INCREASED PSYCHOSOCIAL STRESSORS.  Started on Zoloft 03/11/12.  Appears to be stable.   Follow.   GYN.  Has seen Dr Harold Hedge.  Was supposed to follow up with her.  She declines any further w/up.  Again discussed with her today regarding follow up scanning, etc.  She declines.    HEALTH MAINTENANCE.  Physical 12/08/11.  Colonoscopy 10/07 - normal.  She declines mammogram and further testing.

## 2012-10-05 NOTE — Assessment & Plan Note (Signed)
Blood pressure is low today.  See above.  Will stop the lasix.  Adjust ramipril.  Follow closely.  Get her back in soon to reassess.

## 2012-10-05 NOTE — Assessment & Plan Note (Signed)
Recheck cbc to confirm stable.  Has known anemia.  Declines any further w/up.  See above.

## 2012-10-25 ENCOUNTER — Encounter: Payer: Self-pay | Admitting: Internal Medicine

## 2012-10-25 ENCOUNTER — Ambulatory Visit (INDEPENDENT_AMBULATORY_CARE_PROVIDER_SITE_OTHER): Payer: Medicare Other | Admitting: Internal Medicine

## 2012-10-25 VITALS — BP 118/64 | HR 72 | Temp 97.4°F | Ht 62.75 in | Wt 115.8 lb

## 2012-10-25 DIAGNOSIS — D649 Anemia, unspecified: Secondary | ICD-10-CM

## 2012-10-25 DIAGNOSIS — N289 Disorder of kidney and ureter, unspecified: Secondary | ICD-10-CM

## 2012-10-25 DIAGNOSIS — I10 Essential (primary) hypertension: Secondary | ICD-10-CM

## 2012-10-25 MED ORDER — SERTRALINE HCL 50 MG PO TABS: 50.0000 mg | ORAL_TABLET | Freq: Every day | ORAL | Status: DC

## 2012-10-25 MED ORDER — RAMIPRIL 5 MG PO CAPS: 5.0000 mg | ORAL_CAPSULE | Freq: Every day | ORAL | Status: DC

## 2012-10-26 ENCOUNTER — Encounter: Payer: Self-pay | Admitting: Internal Medicine

## 2012-10-26 NOTE — Progress Notes (Signed)
Subjective:    Patient ID: Kayla Galloway, female    DOB: 1920-12-31, 79 y.o.   MRN: [id number]  HPI 79 year old female with past history of hypertension, gout and hypercholesterolemia.  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  She also had a renal vein thrombosis with some renal insufficiency previously.  She comes in today for a scheduled follow up.  Accompanied by her sister.  History obtained from both of them.  Just recently underwent repeat kyphoplasty.  Performed by Dr Franky Macho.  States she is still having back pain.  Upper back this time. Taking hydrocodone.  Last visit was noted to be orthostatic on exam.  Lasix was stopped.  Ramipril was decreased to 5mg  q day.  She states she is eating better.  Drinking nutritional supplements.  Still with decreased energy.  No nausea or vomiting.  No abdominal pain.  Breathing stable.    Past Medical History  Diagnosis Date  . Arthritis   . Osteoporosis   . Hypercholesterolemia   . Anemia   . GERD (gastroesophageal reflux disease)   . DVT (deep venous thrombosis), left     bilateral, IVC filter 2010  . Depression   . Gout   . Nephrolithiasis   . 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 Renal vein thrombosis     previous renal insufficiency  . Hypertension     Dr. Dale   . Cancer     skin ca lesion of scalp- removed  . Peripheral vascular disease   . Spider veins     Current Outpatient Prescriptions on File Prior to Visit  Medication Sig Dispense Refill  . acetaminophen (TYLENOL) 650 MG CR tablet Take 650 mg by mouth every 8 (eight) hours as needed.      . Calcium Carbonate-Vitamin D (CALCIUM 600 + D PO) Take 1 tablet by mouth 3 (three) times daily.      . Cyanocobalamin (VITAMIN B-12 IJ) Inject 1,000 mcg as directed every 30 (thirty) days.      Marland Kitchen HYDROcodone-acetaminophen (NORCO/VICODIN) 5-325 MG per tablet Take 1  tablet by mouth every 8 (eight) hours as needed.      Marland Kitchen omeprazole (PRILOSEC) 20 MG capsule Take 1 capsule (20 mg total) by mouth daily.  90 capsule  1  . OVER THE COUNTER MEDICATION Take 1 tablet by mouth 2 (two) times daily. Osteo Bi-flex      . furosemide (LASIX) 20 MG tablet Take 20 mg by mouth daily.       No current facility-administered medications on file prior to visit.    Review of Systems Patient denies any headache.  No significant lightheadedness or dizziness.  No chest pain, tightness or palpitations.  No increased shortness of breath, cough or congestion.  No nausea or vomiting.  Drinking nutritional supplements now with her meals.  No acid reflux.  No abdominal pain or cramping. No bowel change, such as diarrhea, constipation, BRBPR or melana.  States the stool softeners are helping with constipation.  No urine change.  Does report decreased energy.        Objective:   Physical Exam  Filed Vitals:   10/25/12 1145  BP: 118/64  Pulse: 72  Temp: 97.4 F (36.3 C)   Blood pressure recheck:  138/62 sitting, 122/8 lying   79 year old female in no acute distress.   HEENT:  Nares - clear.  OP- without lesions or erythema.  NECK:  Supple, nontender.  No audible bruit.   HEART:  Appears to be regular. LUNGS:  Without crackles or wheezing audible.  Respirations even and unlabored.   RADIAL PULSE:  Equal bilaterally.  ABDOMEN:  Soft, nontender.  No audible abdominal bruit.   EXTREMITIES:  No increased edema to be present.                     Assessment & Plan:  WEAKNESS.  Still with decreased energy.  Appears to be getting around better.  Will remain off lasix for now.  Follow.  Declines any further w/up or treatment for her anemia.     MSK.  Seeing Dr Franky Macho.  S/p kyphoplasty.  Still with pain.  Taking hydrocodone.  Feels this is manageable.  Desires no further intervention.     CARDIOVASCULAR.  Stress test 10/25/10 negative with EF 60%.  Saw cardiology.  Continue risk  factor modification.   INCREASED PSYCHOSOCIAL STRESSORS.  Started on Zoloft 03/11/12.  Appears to be stable.   Continue.    GYN.  Has seen Dr Harold Hedge.  Was supposed to follow up with her.  She declines any further w/up.  Again discussed with her today regarding follow up scanning, etc.  She declines.    HEALTH MAINTENANCE.  Physical 12/08/11.  Colonoscopy 10/07 - normal.  She declines mammogram and further testing.

## 2012-10-26 NOTE — Assessment & Plan Note (Signed)
Has known anemia.  Declines any further w/up.  Discussed with her again today.  She declines.    

## 2012-10-26 NOTE — Assessment & Plan Note (Signed)
Cr has been stable at 1.1.  Follow.

## 2012-10-26 NOTE — Assessment & Plan Note (Addendum)
Blood pressure doing well on current regimen.  Follow.   

## 2012-12-28 ENCOUNTER — Ambulatory Visit (INDEPENDENT_AMBULATORY_CARE_PROVIDER_SITE_OTHER): Payer: Medicare Other | Admitting: Internal Medicine

## 2012-12-28 ENCOUNTER — Other Ambulatory Visit: Payer: Self-pay | Admitting: *Deleted

## 2012-12-28 ENCOUNTER — Encounter: Payer: Self-pay | Admitting: Internal Medicine

## 2012-12-28 VITALS — BP 120/60 | HR 71 | Temp 97.8°F | Ht 61.5 in | Wt 117.5 lb

## 2012-12-28 DIAGNOSIS — M810 Age-related osteoporosis without current pathological fracture: Secondary | ICD-10-CM

## 2012-12-28 DIAGNOSIS — I10 Essential (primary) hypertension: Secondary | ICD-10-CM

## 2012-12-28 DIAGNOSIS — E7800 Pure hypercholesterolemia, unspecified: Secondary | ICD-10-CM

## 2012-12-28 DIAGNOSIS — N289 Disorder of kidney and ureter, unspecified: Secondary | ICD-10-CM

## 2012-12-28 DIAGNOSIS — R5383 Other fatigue: Secondary | ICD-10-CM

## 2012-12-28 DIAGNOSIS — R5381 Other malaise: Secondary | ICD-10-CM

## 2012-12-28 DIAGNOSIS — D649 Anemia, unspecified: Secondary | ICD-10-CM

## 2012-12-29 LAB — COMPREHENSIVE METABOLIC PANEL WITH GFR
ALT: 15 U/L (ref 0–35)
AST: 25 U/L (ref 0–37)
Albumin: 3.8 g/dL (ref 3.5–5.2)
Alkaline Phosphatase: 46 U/L (ref 39–117)
BUN: 25 mg/dL — ABNORMAL HIGH (ref 6–23)
CO2: 26 meq/L (ref 19–32)
Calcium: 9.4 mg/dL (ref 8.4–10.5)
Chloride: 104 meq/L (ref 96–112)
Creatinine, Ser: 1.3 mg/dL — ABNORMAL HIGH (ref 0.4–1.2)
GFR: 39.88 mL/min — ABNORMAL LOW
Glucose, Bld: 75 mg/dL (ref 70–99)
Potassium: 4.4 meq/L (ref 3.5–5.1)
Sodium: 136 meq/L (ref 135–145)
Total Bilirubin: 0.6 mg/dL (ref 0.3–1.2)
Total Protein: 6.6 g/dL (ref 6.0–8.3)

## 2012-12-29 LAB — CBC WITH DIFFERENTIAL/PLATELET
Basophils Absolute: 0.1 10*3/uL (ref 0.0–0.1)
Eosinophils Absolute: 0.1 10*3/uL (ref 0.0–0.7)
HCT: 31.6 % — ABNORMAL LOW (ref 36.0–46.0)
Lymphs Abs: 1.8 10*3/uL (ref 0.7–4.0)
MCV: 98.8 fl (ref 78.0–100.0)
Monocytes Absolute: 0.5 10*3/uL (ref 0.1–1.0)
Platelets: 114 10*3/uL — ABNORMAL LOW (ref 150.0–400.0)
RDW: 14.4 % (ref 11.5–14.6)

## 2012-12-29 LAB — FERRITIN: Ferritin: 252.5 ng/mL (ref 10.0–291.0)

## 2012-12-30 ENCOUNTER — Other Ambulatory Visit: Payer: Self-pay | Admitting: Internal Medicine

## 2012-12-30 ENCOUNTER — Encounter: Payer: Self-pay | Admitting: *Deleted

## 2012-12-30 DIAGNOSIS — D649 Anemia, unspecified: Secondary | ICD-10-CM

## 2012-12-30 DIAGNOSIS — N289 Disorder of kidney and ureter, unspecified: Secondary | ICD-10-CM

## 2012-12-30 NOTE — Progress Notes (Signed)
Ordered f/u labs.  

## 2012-12-31 ENCOUNTER — Encounter: Payer: Self-pay | Admitting: Internal Medicine

## 2012-12-31 NOTE — Assessment & Plan Note (Signed)
Cr has been stable.  Follow.   

## 2012-12-31 NOTE — Assessment & Plan Note (Signed)
Bone density 06/30/08 revealed interval decrease.  Received IV Reclast in March 2011.  Continue calcium and vitamin D.  Desires not to receive Reclast.   

## 2012-12-31 NOTE — Assessment & Plan Note (Signed)
Low cholesterol diet.  Desires no medication.  Follow.    

## 2012-12-31 NOTE — Assessment & Plan Note (Signed)
Blood pressure doing well on current regimen.  Follow.   

## 2012-12-31 NOTE — Assessment & Plan Note (Signed)
Has known anemia.  Declines any further w/up.  Discussed with her again today.  She declines.    

## 2012-12-31 NOTE — Progress Notes (Signed)
Subjective:    Patient ID: Kayla Galloway, female    DOB: Jun 14, 1921, 79 y.o.   MRN: [id number]  HPI 79 year old female with past history of hypertension, gout and hypercholesterolemia.  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  She also had a renal vein thrombosis with some renal insufficiency previously.  She comes in today to follow up on these issues as well as for a complete physical exam.  Accompanied by her sister.  History obtained from both of them.  Just recently underwent repeat kyphoplasty.  Performed by Dr Franky Macho.  States she is still having back pain. Taking hydrocodone.  No nausea or vomiting.  No abdominal pain.  Breathing stable.  Some lower extremity swelling.  Some fatigue.    Past Medical History  Diagnosis Date  . Arthritis   . Osteoporosis   . Hypercholesterolemia   . Anemia   . GERD (gastroesophageal reflux disease)   . DVT (deep venous thrombosis), left     bilateral, IVC filter 2010  . Depression   . Gout   . Nephrolithiasis   . 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 Renal vein thrombosis     previous renal insufficiency  . Hypertension     Dr. Dale Houlton  . Cancer     skin ca lesion of scalp- removed  . Peripheral vascular disease   . Spider veins     Current Outpatient Prescriptions on File Prior to Visit  Medication Sig Dispense Refill  . acetaminophen (TYLENOL) 650 MG CR tablet Take 650 mg by mouth every 8 (eight) hours as needed.      . Calcium Carbonate-Vitamin D (CALCIUM 600 + D PO) Take 1 tablet by mouth 3 (three) times daily.      . Cyanocobalamin (VITAMIN B-12 IJ) Inject 1,000 mcg as directed every 30 (thirty) days.      . furosemide (LASIX) 20 MG tablet Take 20 mg by mouth daily.      Marland Kitchen HYDROcodone-acetaminophen (NORCO/VICODIN) 5-325 MG per tablet Take 1 tablet by mouth every 8 (eight) hours as needed.      Marland Kitchen omeprazole  (PRILOSEC) 20 MG capsule Take 1 capsule (20 mg total) by mouth daily.  90 capsule  1  . OVER THE COUNTER MEDICATION Take 1 tablet by mouth 2 (two) times daily. Osteo Bi-flex      . ramipril (ALTACE) 5 MG capsule Take 1 capsule (5 mg total) by mouth daily.  30 capsule  3  . sertraline (ZOLOFT) 50 MG tablet Take 1 tablet (50 mg total) by mouth daily.  30 tablet  3   No current facility-administered medications on file prior to visit.    Review of Systems Patient denies any headache.  No significant lightheadedness or dizziness.  No chest pain, tightness or palpitations.  No increased shortness of breath, cough or congestion.  No nausea or vomiting.  No acid reflux.  No abdominal pain or cramping.  No bowel change, such as diarrhea, constipation, BRBPR or melana.  States the stool softeners are helping with constipation.  No urine change.  Does report decreased energy.  Persistent back pain.        Objective:   Physical Exam  Filed Vitals:   12/28/12 1535  BP: 120/60  Pulse: 71  Temp: 97.8 F (36.70 C)   79 year old female in no acute distress.  HEENT:  Nares- clear.  Oropharynx - without lesions. NECK:  Supple.  Nontender.  No audible bruit.  HEART:  Appears to be regular. LUNGS:  No crackles or wheezing audible.  Respirations even and unlabored.  RADIAL PULSE:  Equal bilaterally.    BREASTS:  No nipple discharge or nipple retraction present.  Could not appreciate any distinct nodules or axillary adenopathy.  ABDOMEN:  Soft, nontender.  Bowel sounds present and normal.  No audible abdominal bruit.  GU:  Not performed.    EXTREMITIES:  Bilateral pedal and ankle edema.  No increased erythema or warmth.            Assessment & Plan:  INSOMNIA.  Has trouble sleeping.  Discussed with her today.  Discussed some behavioral modifications.  Discussed the importance of not drinking an increased amount of fluid prior to bed.  Will avoid medication at this time.   WEAKNESS.  Still with  decreased energy.  Appears to be getting around better.  Follow.  Declines any further w/up or treatment for her anemia.     MSK.  Seeing Dr Franky Macho.  S/p kyphoplasty.  Still with pain.  Taking hydrocodone.  Feels this is manageable.  Desires no further intervention.     CARDIOVASCULAR.  Stress test 10/25/10 negative with EF 60%.  Saw cardiology.  Continue risk factor modification.   INCREASED PSYCHOSOCIAL STRESSORS.  Started on Zoloft 03/11/12.  Appears to be stable.   Continue.    GYN.  Has seen Dr Harold Hedge.  Was supposed to follow up with her.  She declines any further w/up.  Again discussed with her today regarding follow up scanning, etc.  She declines.    HEALTH MAINTENANCE.  Physical today.  Colonoscopy 10/07 - normal.  She declines mammogram and further testing.

## 2013-01-06 ENCOUNTER — Other Ambulatory Visit (INDEPENDENT_AMBULATORY_CARE_PROVIDER_SITE_OTHER): Payer: Medicare Other

## 2013-01-06 DIAGNOSIS — D649 Anemia, unspecified: Secondary | ICD-10-CM

## 2013-01-06 DIAGNOSIS — N289 Disorder of kidney and ureter, unspecified: Secondary | ICD-10-CM

## 2013-01-06 LAB — CBC WITH DIFFERENTIAL/PLATELET
Basophils Absolute: 0 10*3/uL (ref 0.0–0.1)
Basophils Relative: 0.4 % (ref 0.0–3.0)
Eosinophils Absolute: 0 10*3/uL (ref 0.0–0.7)
HCT: 30.7 % — ABNORMAL LOW (ref 36.0–46.0)
Hemoglobin: 10.6 g/dL — ABNORMAL LOW (ref 12.0–15.0)
Lymphs Abs: 2.1 10*3/uL (ref 0.7–4.0)
MCHC: 34.5 g/dL (ref 30.0–36.0)
Monocytes Relative: 11.5 % (ref 3.0–12.0)
Neutro Abs: 3.3 10*3/uL (ref 1.4–7.7)
RDW: 13.7 % (ref 11.5–14.6)

## 2013-01-06 LAB — BASIC METABOLIC PANEL
BUN: 22 mg/dL (ref 6–23)
CO2: 25 mEq/L (ref 19–32)
Calcium: 9 mg/dL (ref 8.4–10.5)
Creatinine, Ser: 1 mg/dL (ref 0.4–1.2)

## 2013-01-07 ENCOUNTER — Other Ambulatory Visit: Payer: Self-pay | Admitting: Internal Medicine

## 2013-01-07 DIAGNOSIS — E871 Hypo-osmolality and hyponatremia: Secondary | ICD-10-CM

## 2013-01-07 NOTE — Progress Notes (Signed)
Ordered f/u sodium.

## 2013-01-20 ENCOUNTER — Other Ambulatory Visit (INDEPENDENT_AMBULATORY_CARE_PROVIDER_SITE_OTHER): Payer: Medicare Other

## 2013-01-20 DIAGNOSIS — E871 Hypo-osmolality and hyponatremia: Secondary | ICD-10-CM

## 2013-01-21 ENCOUNTER — Encounter: Payer: Self-pay | Admitting: *Deleted

## 2013-03-15 ENCOUNTER — Other Ambulatory Visit: Payer: Self-pay | Admitting: *Deleted

## 2013-03-15 MED ORDER — FUROSEMIDE 20 MG PO TABS: 20.0000 mg | ORAL_TABLET | Freq: Every day | ORAL | Status: DC

## 2013-03-20 ENCOUNTER — Other Ambulatory Visit: Payer: Self-pay | Admitting: Internal Medicine

## 2013-03-20 MED ORDER — FUROSEMIDE 20 MG PO TABS: 20.0000 mg | ORAL_TABLET | Freq: Every day | ORAL | Status: DC

## 2013-03-20 NOTE — Progress Notes (Signed)
Refilled lasix #90 with 3 refills.

## 2013-05-04 ENCOUNTER — Encounter: Payer: Self-pay | Admitting: Internal Medicine

## 2013-05-04 ENCOUNTER — Ambulatory Visit (INDEPENDENT_AMBULATORY_CARE_PROVIDER_SITE_OTHER): Payer: Medicare Other | Admitting: Internal Medicine

## 2013-05-04 ENCOUNTER — Other Ambulatory Visit: Payer: Self-pay | Admitting: Internal Medicine

## 2013-05-04 VITALS — BP 110/70 | HR 75 | Temp 97.8°F | Ht 61.5 in | Wt 120.8 lb

## 2013-05-04 DIAGNOSIS — N289 Disorder of kidney and ureter, unspecified: Secondary | ICD-10-CM

## 2013-05-04 DIAGNOSIS — E871 Hypo-osmolality and hyponatremia: Secondary | ICD-10-CM

## 2013-05-04 DIAGNOSIS — D649 Anemia, unspecified: Secondary | ICD-10-CM

## 2013-05-04 DIAGNOSIS — E7800 Pure hypercholesterolemia, unspecified: Secondary | ICD-10-CM

## 2013-05-04 DIAGNOSIS — I10 Essential (primary) hypertension: Secondary | ICD-10-CM

## 2013-05-04 DIAGNOSIS — M810 Age-related osteoporosis without current pathological fracture: Secondary | ICD-10-CM

## 2013-05-04 LAB — COMPREHENSIVE METABOLIC PANEL
Albumin: 4.2 g/dL (ref 3.5–5.2)
BUN: 20 mg/dL (ref 6–23)
CO2: 24 mEq/L (ref 19–32)
Calcium: 9.3 mg/dL (ref 8.4–10.5)
GFR: 51.77 mL/min — ABNORMAL LOW (ref >60.00)
Glucose, Bld: 83 mg/dL (ref 70–99)
Potassium: 3.8 mEq/L (ref 3.5–5.1)
Sodium: 132 mEq/L — ABNORMAL LOW (ref 135–145)
Total Protein: 6.9 g/dL (ref 6.0–8.3)

## 2013-05-04 LAB — CBC WITH DIFFERENTIAL/PLATELET
Eosinophils Relative: 1.7 % (ref 0.0–5.0)
Lymphocytes Relative: 35.3 % (ref 12.0–46.0)
Monocytes Absolute: 0.5 10*3/uL (ref 0.1–1.0)
Monocytes Relative: 10.7 % (ref 3.0–12.0)
Neutrophils Relative %: 52 % (ref 43.0–77.0)
Platelets: 147 10*3/uL — ABNORMAL LOW (ref 150.0–400.0)
WBC: 4.9 10*3/uL (ref 4.5–10.5)

## 2013-05-04 LAB — FERRITIN: Ferritin: 232.1 ng/mL (ref 10.0–291.0)

## 2013-05-04 NOTE — Progress Notes (Signed)
Order placed for f/u sodium.  ?

## 2013-05-05 LAB — VITAMIN D 25 HYDROXY (VIT D DEFICIENCY, FRACTURES): Vit D, 25-Hydroxy: 59 ng/mL (ref 30–89)

## 2013-05-07 ENCOUNTER — Encounter: Payer: Self-pay | Admitting: Internal Medicine

## 2013-05-07 NOTE — Assessment & Plan Note (Signed)
Has known anemia.  Declines any further w/up.  Discussed with her again today.  She declines.    

## 2013-05-07 NOTE — Progress Notes (Signed)
Subjective:    Patient ID: Kayla Galloway, female    DOB: April 09, 1921, 79 y.o.   MRN: [id number]  HPI 79 year old female with past history of hypertension, gout and hypercholesterolemia.  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  She also had a renal vein thrombosis with some renal insufficiency previously.  She comes in today for a scheduled follow up.   Followed by Dr Franky Macho.  States she is still having back pain. Taking hydrocodone.  No nausea or vomiting.  No abdominal pain.  Breathing stable.  Some fatigue.  Eating and drinking well.  Weight is up.      Past Medical History  Diagnosis Date  . Arthritis   . Osteoporosis   . Hypercholesterolemia   . Anemia   . GERD (gastroesophageal reflux disease)   . DVT (deep venous thrombosis), left     bilateral, IVC filter 2010  . Depression   . Gout   . Nephrolithiasis   . 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 Renal vein thrombosis     previous renal insufficiency  . Hypertension     Dr. Dale Stanfield  . Cancer     skin ca lesion of scalp- removed  . Peripheral vascular disease   . Spider veins     Current Outpatient Prescriptions on File Prior to Visit  Medication Sig Dispense Refill  . acetaminophen (TYLENOL) 650 MG CR tablet Take 650 mg by mouth every 8 (eight) hours as needed.      . Calcium Carbonate-Vitamin D (CALCIUM 600 + D PO) Take 1 tablet by mouth 3 (three) times daily.      . furosemide (LASIX) 20 MG tablet Take 1 tablet (20 mg total) by mouth daily.  90 tablet  3  . HYDROcodone-acetaminophen (NORCO/VICODIN) 5-325 MG per tablet Take 1 tablet by mouth every 8 (eight) hours as needed.      Marland Kitchen omeprazole (PRILOSEC) 20 MG capsule Take 1 capsule (20 mg total) by mouth daily.  90 capsule  1  . OVER THE COUNTER MEDICATION Take 1 tablet by mouth 2 (two) times daily. Osteo Bi-flex      . ramipril (ALTACE) 5 MG  capsule Take 1 capsule (5 mg total) by mouth daily.  30 capsule  3  . sertraline (ZOLOFT) 50 MG tablet Take 1 tablet (50 mg total) by mouth daily.  30 tablet  3  . Cyanocobalamin (VITAMIN B-12 IJ) Inject 1,000 mcg as directed every 30 (thirty) days.       No current facility-administered medications on file prior to visit.    Review of Systems Patient denies any headache.  No significant lightheadedness or dizziness.  No chest pain, tightness or palpitations.  No increased shortness of breath, cough or congestion.  No nausea or vomiting.  No acid reflux.  No abdominal pain or cramping.  No bowel change, such as diarrhea, constipation, BRBPR or melana.  States the stool softeners are helping with constipation.  No urine change.  Does report decreased energy.  Persistent back pain.  Stable.       Objective:   Physical Exam  Filed Vitals:   05/04/13 1007  BP: 110/70  Pulse: 75  Temp: 97.8 F (5.16 C)   79 year old female in no acute distress.   HEENT:  Nares- clear.  Oropharynx - without lesions. NECK:  Supple.  Nontender.  No audible bruit.  HEART:  Appears to be regular. LUNGS:  No crackles or wheezing audible.  Respirations even and unlabored.  RADIAL PULSE:  Equal bilaterally.  ABDOMEN:  Soft, nontender.  Bowel sounds present and normal.  No audible abdominal bruit.    EXTREMITIES:  Bilateral pedal and ankle edema.  No increased erythema or warmth.            Assessment & Plan:  WEAKNESS.  Some decreased energy.  Appears to be getting around better.  Follow.  Declines any further w/up or treatment for her anemia.     MSK.  Seeing Dr Franky Macho.  S/p kyphoplasty.  Still with pain.  Taking hydrocodone.  Feels this is manageable.  Desires no further intervention.     CARDIOVASCULAR.  Stress test 10/25/10 negative with EF 60%.  Saw cardiology.  Continue risk factor modification.   INCREASED PSYCHOSOCIAL STRESSORS.  Started on Zoloft 03/11/12.  Appears to be stable.   Continue.    GYN.   Has seen Dr Harold Hedge.  Was supposed to follow up with her.  She declines any further w/up.  Again discussed with her today regarding follow up scanning, etc.  She declines.    HEALTH MAINTENANCE.  Physical last visit.  Colonoscopy 10/07 - normal.  She declines mammogram and further testing.

## 2013-05-07 NOTE — Assessment & Plan Note (Signed)
Bone density 06/30/08 revealed interval decrease.  Received IV Reclast in March 2011.  Continue calcium and vitamin D.  Desires not to receive Reclast.   

## 2013-05-07 NOTE — Assessment & Plan Note (Signed)
Blood pressure doing well on current regimen.  Follow.   

## 2013-05-07 NOTE — Assessment & Plan Note (Signed)
Low cholesterol diet.  Desires no medication.  Follow.    

## 2013-05-07 NOTE — Assessment & Plan Note (Signed)
Cr has been stable.  Follow.   

## 2013-05-19 ENCOUNTER — Other Ambulatory Visit (INDEPENDENT_AMBULATORY_CARE_PROVIDER_SITE_OTHER): Payer: Medicare Other

## 2013-05-19 DIAGNOSIS — E871 Hypo-osmolality and hyponatremia: Secondary | ICD-10-CM

## 2013-05-19 LAB — SODIUM: Sodium: 132 mEq/L — ABNORMAL LOW (ref 135–145)

## 2013-05-20 ENCOUNTER — Other Ambulatory Visit: Payer: Self-pay | Admitting: Internal Medicine

## 2013-05-20 DIAGNOSIS — E871 Hypo-osmolality and hyponatremia: Secondary | ICD-10-CM

## 2013-05-20 DIAGNOSIS — I10 Essential (primary) hypertension: Secondary | ICD-10-CM

## 2013-05-20 NOTE — Progress Notes (Signed)
Order placed for f/u lab.   

## 2013-06-20 ENCOUNTER — Other Ambulatory Visit: Payer: Medicare Other

## 2013-06-21 ENCOUNTER — Other Ambulatory Visit (INDEPENDENT_AMBULATORY_CARE_PROVIDER_SITE_OTHER): Payer: Medicare Other

## 2013-06-21 DIAGNOSIS — E871 Hypo-osmolality and hyponatremia: Secondary | ICD-10-CM

## 2013-06-21 DIAGNOSIS — I10 Essential (primary) hypertension: Secondary | ICD-10-CM

## 2013-06-22 ENCOUNTER — Other Ambulatory Visit: Payer: Self-pay | Admitting: Internal Medicine

## 2013-06-22 DIAGNOSIS — N289 Disorder of kidney and ureter, unspecified: Secondary | ICD-10-CM

## 2013-06-22 LAB — BASIC METABOLIC PANEL
BUN: 20 mg/dL (ref 6–23)
GFR: 42.4 mL/min — ABNORMAL LOW (ref >60.00)
Potassium: 4.3 mEq/L (ref 3.5–5.1)
Sodium: 133 mEq/L — ABNORMAL LOW (ref 135–145)

## 2013-06-22 NOTE — Progress Notes (Signed)
Orders placed for f/u labs.  

## 2013-06-23 ENCOUNTER — Encounter: Payer: Self-pay | Admitting: *Deleted

## 2013-07-06 ENCOUNTER — Other Ambulatory Visit: Payer: Self-pay | Admitting: *Deleted

## 2013-07-06 MED ORDER — SERTRALINE HCL 50 MG PO TABS: 50.0000 mg | ORAL_TABLET | Freq: Every day | ORAL | Status: DC

## 2013-07-07 ENCOUNTER — Ambulatory Visit (INDEPENDENT_AMBULATORY_CARE_PROVIDER_SITE_OTHER): Payer: Medicare Other | Admitting: Adult Health

## 2013-07-07 ENCOUNTER — Telehealth: Payer: Self-pay | Admitting: *Deleted

## 2013-07-07 ENCOUNTER — Encounter: Payer: Self-pay | Admitting: Adult Health

## 2013-07-07 ENCOUNTER — Other Ambulatory Visit (INDEPENDENT_AMBULATORY_CARE_PROVIDER_SITE_OTHER): Payer: Medicare Other

## 2013-07-07 VITALS — BP 158/80 | HR 78 | Temp 97.6°F | Resp 16 | Wt 121.0 lb

## 2013-07-07 DIAGNOSIS — I83009 Varicose veins of unspecified lower extremity with ulcer of unspecified site: Secondary | ICD-10-CM | POA: Insufficient documentation

## 2013-07-07 DIAGNOSIS — L97309 Non-pressure chronic ulcer of unspecified ankle with unspecified severity: Secondary | ICD-10-CM

## 2013-07-07 DIAGNOSIS — I83003 Varicose veins of unspecified lower extremity with ulcer of ankle: Secondary | ICD-10-CM

## 2013-07-07 DIAGNOSIS — N289 Disorder of kidney and ureter, unspecified: Secondary | ICD-10-CM

## 2013-07-07 LAB — BASIC METABOLIC PANEL
BUN: 16 mg/dL (ref 6–23)
CO2: 29 mEq/L (ref 19–32)
Chloride: 97 mEq/L (ref 96–112)
Creatinine, Ser: 1.2 mg/dL (ref 0.4–1.2)
Potassium: 4.1 mEq/L (ref 3.5–5.1)

## 2013-07-07 LAB — URINALYSIS, ROUTINE W REFLEX MICROSCOPIC
Bilirubin Urine: NEGATIVE
Nitrite: NEGATIVE
Total Protein, Urine: NEGATIVE
pH: 6 (ref 5.0–8.0)

## 2013-07-07 MED ORDER — CEPHALEXIN 500 MG PO CAPS: 500.0000 mg | ORAL_CAPSULE | Freq: Two times a day (BID) | ORAL | Status: DC

## 2013-07-07 NOTE — Progress Notes (Signed)
Pre visit review using our clinic review tool, if applicable. No additional management support is needed unless otherwise documented below in the visit note. 

## 2013-07-07 NOTE — Progress Notes (Signed)
  Subjective:    Patient ID: Kayla Galloway, female    DOB: 08-04-1921, 79 y.o.   MRN: [id number]  HPI  Patient is a pleasant 79 y/o female who presents to clinic with bilateral ankle swelling for several weeks. She noticed an area of redness on bilateral ankles and thought she needed to get this evaluated. Patient reports that she elevates her legs during the night. Sometimes the swelling is improved in the morning but other times it is not. Patient reports taking lasix every morning.  Current Outpatient Prescriptions on File Prior to Visit  Medication Sig Dispense Refill  . acetaminophen (TYLENOL) 650 MG CR tablet Take 650 mg by mouth every 8 (eight) hours as needed.      . Calcium Carbonate-Vitamin D (CALCIUM 600 + D PO) Take 1 tablet by mouth 3 (three) times daily.      . Cyanocobalamin (VITAMIN B-12 IJ) Inject 1,000 mcg as directed every 30 (thirty) days.      . furosemide (LASIX) 20 MG tablet Take 1 tablet (20 mg total) by mouth daily.  90 tablet  3  . HYDROcodone-acetaminophen (NORCO/VICODIN) 5-325 MG per tablet Take 1 tablet by mouth every 8 (eight) hours as needed.      Marland Kitchen omeprazole (PRILOSEC) 20 MG capsule Take 1 capsule (20 mg total) by mouth daily.  90 capsule  1  . OVER THE COUNTER MEDICATION Take 1 tablet by mouth 2 (two) times daily. Osteo Bi-flex      . ramipril (ALTACE) 5 MG capsule Take 1 capsule (5 mg total) by mouth daily.  30 capsule  3  . sertraline (ZOLOFT) 50 MG tablet Take 1 tablet (50 mg total) by mouth daily.  30 tablet  2   No current facility-administered medications on file prior to visit.     Review of Systems  Skin: Positive for wound.       Bilateral lateral ankle swelling and redness       Objective:   Physical Exam  Constitutional: She is oriented to person, place, and time. No distress.  Cardiovascular: Normal rate and regular rhythm.   Pulmonary/Chest: Effort normal. No respiratory distress.  Neurological: She is alert and oriented to person,  place, and time.  Skin: There is erythema.  Bilateral lateral ankles with edema, erythema. There is mild skin breakdown with some clear liquid weeping.  Psychiatric: She has a normal mood and affect. Her behavior is normal. Judgment and thought content normal.          Assessment & Plan:

## 2013-07-07 NOTE — Telephone Encounter (Signed)
Pt's Zoloft was refilled to mail order. Pt was at [facility] to pick up refill. Megan from [facility] called, requesting enough pills until mail order is received.  Ok'd refill Zoloft 50 mg #14.

## 2013-07-07 NOTE — Assessment & Plan Note (Addendum)
Bilateral ankles. Start Keflex 500 mg bid x 14 days. Refer to wound clinic. Elevate lower extremities. Avoid injuring area. Report symptoms worsening.

## 2013-07-07 NOTE — Patient Instructions (Signed)
Stasis Ulcer °Stasis ulcers occur in the legs when the circulation is damaged. An ulcer may look like a small hole in the skin.  °CAUSES °Stasis ulcers occur because your veins do not work properly. Veins have valves that help the blood return to the heart. If these valves do not work right, blood flows backwards and backs up into the veins near the skin. This condition causes the veins to become larger because of increased pressure and may lead to a stasis ulcer. °SYMPTOMS  °· Shallow (superficial) sore on the leg. °· Clear drainage or weeping from the sore. °· Leg pain or a feeling of heaviness. This may be worse at the end of the day. °· Leg swelling. °· Skin color changes. °DIAGNOSIS  °Your caregiver will make a diagnosis by examining your leg. Your caregiver may order tests such as an ultrasound or other studies to evaluate the blood flow of the leg. °HOME CARE INSTRUCTIONS  °· Do not stand or sit in one position for long periods of time. Do not sit with your legs crossed. Rest with your legs raised during the day. If possible, it is best if you can elevate your legs above your heart for 30 minutes, 3 to 4 times a day. °· Wear elastic stockings or support hose. Do not wear other tight encircling garments around legs, pelvis, or waist. This causes increased pressure in your veins. If your caregiver has applied compressive medicated wraps, use them as instructed. °· Walk as much as possible to increase blood flow. If you are taking long rides in a car or plane, take a break to walk around every 2 hours. If not already on aspirin, take a baby aspirin before long trips unless you have medical reasons that prohibit this. °· Raise the foot of your bed at night with 2-inch blocks if approved by your caregiver. This may not be desirable if you have heart failure or breathing problems. °· If you get a cut in the skin over the vein and the vein bleeds, lie down with your leg raised and gently clean the area with a clean  cloth. Apply pressure on the cut until the bleeding stops. Then place a dressing on the cut. See your caregiver if it continues to bleed or needs stitches. Also, see your caregiver if you develop an infection. Signs of an infection include a fever, redness, increased pain, and drainage of pus. °· If your caregiver has given you a follow-up appointment, it is very important to keep that appointment. Not keeping the appointment could result in a chronic or permanent injury, pain, and disability. If there is any problem keeping the appointment, call your caregiver for assistance. °SEEK IMMEDIATE MEDICAL CARE IF: °· The ulcer area starts to break down. °· You have pain, redness, tenderness, pus, or hard swelling in your leg over a vein or near the ulcer. °· Your leg pain is uncomfortable. °· You develop an unexplained fever. °· You develop chest pain or shortness of breath. °Document Released: 04/29/2001 Document Revised: 10/27/2011 Document Reviewed: 11/24/2010 °[employer]® Patient Information ©2014 [employer]. ° °

## 2013-07-08 ENCOUNTER — Ambulatory Visit: Payer: Medicare Other

## 2013-07-08 DIAGNOSIS — N289 Disorder of kidney and ureter, unspecified: Secondary | ICD-10-CM

## 2013-07-08 NOTE — Addendum Note (Signed)
Addended by: Montine Circle D on: 07/08/2013 03:36 PM   Modules accepted: Orders

## 2013-07-11 ENCOUNTER — Encounter: Payer: Self-pay | Admitting: Surgery

## 2013-07-12 ENCOUNTER — Other Ambulatory Visit: Payer: Self-pay | Admitting: *Deleted

## 2013-07-12 ENCOUNTER — Telehealth: Payer: Self-pay | Admitting: *Deleted

## 2013-07-12 MED ORDER — CEFUROXIME AXETIL 250 MG PO TABS: 250.0000 mg | ORAL_TABLET | Freq: Two times a day (BID) | ORAL | Status: DC

## 2013-07-12 MED ORDER — CIPROFLOXACIN HCL 250 MG PO TABS: 250.0000 mg | ORAL_TABLET | Freq: Two times a day (BID) | ORAL | Status: DC

## 2013-07-12 NOTE — Telephone Encounter (Signed)
I contacted the pharmacy & notified them to cancel the Cipro & change to Ceftin 250mg  BID x 1 week.

## 2013-07-12 NOTE — Telephone Encounter (Signed)
Called to inform you that the Cipro that we sent in today for UTI has a risk of drug interaction with the Sertraline (increased risk of arrythmia's)-Okay to fill? Please advise

## 2013-07-12 NOTE — Telephone Encounter (Signed)
Hold on cipro and try ceftin 250mg  bid x 1 week.  Confirm no allergy to this.

## 2013-07-18 ENCOUNTER — Encounter: Payer: Self-pay | Admitting: Surgery

## 2013-07-18 ENCOUNTER — Encounter: Payer: Self-pay | Admitting: Adult Health

## 2013-07-18 ENCOUNTER — Ambulatory Visit (INDEPENDENT_AMBULATORY_CARE_PROVIDER_SITE_OTHER): Payer: Medicare Other | Admitting: Adult Health

## 2013-07-18 VITALS — BP 128/74 | HR 88 | Temp 97.5°F | Resp 14 | Wt 126.0 lb

## 2013-07-18 DIAGNOSIS — L270 Generalized skin eruption due to drugs and medicaments taken internally: Secondary | ICD-10-CM | POA: Insufficient documentation

## 2013-07-18 LAB — POCT URINALYSIS DIPSTICK
Blood, UA: NEGATIVE
Glucose, UA: NEGATIVE
Spec Grav, UA: 1.01
Urobilinogen, UA: 0.2

## 2013-07-18 MED ORDER — PREDNISONE 10 MG PO TABS: ORAL_TABLET | ORAL | Status: DC

## 2013-07-18 NOTE — Telephone Encounter (Signed)
States pt has had a reaction of full body rash to medication.  No appt available with Dr. Lorin Picket, scheduled with R. Rey @ 2 p.m.  Prefers to see Dr. Lorin Picket if possible.  States she has an appt around noon at the wound center so could not come at that time.

## 2013-07-18 NOTE — Assessment & Plan Note (Signed)
Rash after taking 2 doses of Ceftin. Patient with significant itching as worse complaint. No shortness or breath or wheezing. Start prednisone taper.

## 2013-07-18 NOTE — Telephone Encounter (Signed)
Noted.  Pt seen by Raquel this pm.

## 2013-07-18 NOTE — Progress Notes (Signed)
   Subjective:    Patient ID: Kayla Galloway, female    DOB: 1920/10/20, 79 y.o.   MRN: [id number]  HPI  Patient presents to clinic with full body rash after starting Ceftin antibiotic. She has been taking benadryl for the itching. She has not experienced any shortness of breath.    Current Outpatient Prescriptions on File Prior to Visit  Medication Sig Dispense Refill  . acetaminophen (TYLENOL) 650 MG CR tablet Take 650 mg by mouth every 8 (eight) hours as needed.      . Calcium Carbonate-Vitamin D (CALCIUM 600 + D PO) Take 1 tablet by mouth 3 (three) times daily.      . Cyanocobalamin (VITAMIN B-12 IJ) Inject 1,000 mcg as directed every 30 (thirty) days.      . furosemide (LASIX) 20 MG tablet Take 1 tablet (20 mg total) by mouth daily.  90 tablet  3  . HYDROcodone-acetaminophen (NORCO/VICODIN) 5-325 MG per tablet Take 1 tablet by mouth every 8 (eight) hours as needed.      Marland Kitchen omeprazole (PRILOSEC) 20 MG capsule Take 1 capsule (20 mg total) by mouth daily.  90 capsule  1  . OVER THE COUNTER MEDICATION Take 1 tablet by mouth 2 (two) times daily. Osteo Bi-flex      . ramipril (ALTACE) 5 MG capsule Take 1 capsule (5 mg total) by mouth daily.  30 capsule  3  . sertraline (ZOLOFT) 50 MG tablet Take 1 tablet (50 mg total) by mouth daily.  30 tablet  2   No current facility-administered medications on file prior to visit.    Review of Systems  Constitutional: Negative.   Skin: Positive for rash.  Psychiatric/Behavioral: Negative.        Objective:   Physical Exam  Constitutional: She is oriented to person, place, and time. No distress.  Cardiovascular: Normal rate and regular rhythm.   Pulmonary/Chest: Effort normal and breath sounds normal. No respiratory distress. She has no wheezes.  Neurological: She is alert and oriented to person, place, and time.  Skin: Skin is warm. Rash noted.  Psychiatric: She has a normal mood and affect. Her behavior is normal. Judgment and thought  content normal.    BP 128/74  Pulse 88  Temp(Src) 97.5 F (36.4 C) (Oral)  Resp 14  Wt 126 lb (57.153 kg)       Assessment & Plan:

## 2013-07-18 NOTE — Patient Instructions (Signed)
  Start prednisone taper as follows:  Day #1 - take 6 tablets Day #2 - take 5 tablets Day #3 - take 4 tablets Day #4 - take 3 tablets Day #5 - take 2 tablets Day #6 - take 1 tablet  Continue to take benadryl every 6 hours as needed for itching.

## 2013-07-18 NOTE — Addendum Note (Signed)
Addended by: Montine Circle D on: 07/18/2013 03:10 PM   Modules accepted: Orders

## 2013-07-18 NOTE — Telephone Encounter (Signed)
FYI

## 2013-07-18 NOTE — Progress Notes (Signed)
Pre visit review using our clinic review tool, if applicable. No additional management support is needed unless otherwise documented below in the visit note. 

## 2013-07-19 LAB — URINE CULTURE: Colony Count: 50000

## 2013-07-25 ENCOUNTER — Other Ambulatory Visit (INDEPENDENT_AMBULATORY_CARE_PROVIDER_SITE_OTHER): Payer: Medicare Other

## 2013-07-25 DIAGNOSIS — N390 Urinary tract infection, site not specified: Secondary | ICD-10-CM

## 2013-07-25 LAB — URINALYSIS
Bilirubin Urine: NEGATIVE
Hgb urine dipstick: NEGATIVE
Ketones, ur: NEGATIVE
Leukocytes, UA: NEGATIVE
Nitrite: NEGATIVE
Urobilinogen, UA: 0.2 (ref 0.0–1.0)

## 2013-08-02 ENCOUNTER — Telehealth: Payer: Self-pay | Admitting: *Deleted

## 2013-08-02 NOTE — Telephone Encounter (Signed)
Pt states that she had an appt today with Dr. Orson Aloe today to evaluate rash & he went ahead and treated her UTI sx's also

## 2013-08-02 NOTE — Telephone Encounter (Signed)
Pt wants to know if she could come by the office & drop off a urine specimen? C/O burning when urination & odor. Please advise

## 2013-08-02 NOTE — Telephone Encounter (Signed)
Noted.  Let me know if any problems.

## 2013-08-02 NOTE — Telephone Encounter (Signed)
With symptoms - she will need to be seen.  I will have to work her in tomorrow.  End of 1/2 day.  May have to wait.  Will be worked in.

## 2013-08-29 ENCOUNTER — Other Ambulatory Visit: Payer: Self-pay | Admitting: Internal Medicine

## 2013-08-31 ENCOUNTER — Other Ambulatory Visit: Payer: Self-pay | Admitting: *Deleted

## 2013-08-31 MED ORDER — RAMIPRIL 5 MG PO CAPS: 5.0000 mg | ORAL_CAPSULE | Freq: Every day | ORAL | Status: DC

## 2013-09-07 ENCOUNTER — Encounter (INDEPENDENT_AMBULATORY_CARE_PROVIDER_SITE_OTHER): Payer: Self-pay

## 2013-09-07 ENCOUNTER — Encounter: Payer: Self-pay | Admitting: Internal Medicine

## 2013-09-07 ENCOUNTER — Ambulatory Visit (INDEPENDENT_AMBULATORY_CARE_PROVIDER_SITE_OTHER): Payer: Medicare Other | Admitting: Internal Medicine

## 2013-09-07 VITALS — BP 110/60 | HR 82 | Temp 97.6°F | Ht 61.5 in | Wt 121.8 lb

## 2013-09-07 DIAGNOSIS — D649 Anemia, unspecified: Secondary | ICD-10-CM

## 2013-09-07 DIAGNOSIS — R5383 Other fatigue: Secondary | ICD-10-CM

## 2013-09-07 DIAGNOSIS — R5381 Other malaise: Secondary | ICD-10-CM

## 2013-09-07 DIAGNOSIS — M810 Age-related osteoporosis without current pathological fracture: Secondary | ICD-10-CM

## 2013-09-07 DIAGNOSIS — I10 Essential (primary) hypertension: Secondary | ICD-10-CM

## 2013-09-07 DIAGNOSIS — N289 Disorder of kidney and ureter, unspecified: Secondary | ICD-10-CM

## 2013-09-07 DIAGNOSIS — E7800 Pure hypercholesterolemia, unspecified: Secondary | ICD-10-CM

## 2013-09-07 DIAGNOSIS — E538 Deficiency of other specified B group vitamins: Secondary | ICD-10-CM

## 2013-09-07 LAB — CBC WITH DIFFERENTIAL/PLATELET
BASOS PCT: 0.6 % (ref 0.0–3.0)
Basophils Absolute: 0 10*3/uL (ref 0.0–0.1)
EOS ABS: 0.2 10*3/uL (ref 0.0–0.7)
Eosinophils Relative: 3.7 % (ref 0.0–5.0)
HCT: 31.3 % — ABNORMAL LOW (ref 36.0–46.0)
HEMOGLOBIN: 10.6 g/dL — AB (ref 12.0–15.0)
LYMPHS PCT: 19.4 % (ref 12.0–46.0)
Lymphs Abs: 1.1 10*3/uL (ref 0.7–4.0)
MCHC: 34 g/dL (ref 30.0–36.0)
MCV: 96.8 fl (ref 78.0–100.0)
MONO ABS: 0.7 10*3/uL (ref 0.1–1.0)
Monocytes Relative: 13.1 % — ABNORMAL HIGH (ref 3.0–12.0)
NEUTROS ABS: 3.5 10*3/uL (ref 1.4–7.7)
Neutrophils Relative %: 63.2 % (ref 43.0–77.0)
Platelets: 159 10*3/uL (ref 150.0–400.0)
RBC: 3.23 Mil/uL — AB (ref 3.87–5.11)
RDW: 13.3 % (ref 11.5–14.6)
WBC: 5.5 10*3/uL (ref 4.5–10.5)

## 2013-09-07 LAB — COMPREHENSIVE METABOLIC PANEL
ALT: 17 U/L (ref 0–35)
AST: 25 U/L (ref 0–37)
Albumin: 3.5 g/dL (ref 3.5–5.2)
Alkaline Phosphatase: 50 U/L (ref 39–117)
BUN: 19 mg/dL (ref 6–23)
CHLORIDE: 101 meq/L (ref 96–112)
CO2: 28 mEq/L (ref 19–32)
CREATININE: 0.9 mg/dL (ref 0.4–1.2)
Calcium: 9.2 mg/dL (ref 8.4–10.5)
GFR: 60.16 mL/min (ref >60.00)
GLUCOSE: 81 mg/dL (ref 70–99)
Potassium: 4.5 mEq/L (ref 3.5–5.1)
Sodium: 135 mEq/L (ref 135–145)
Total Bilirubin: 0.4 mg/dL (ref 0.3–1.2)
Total Protein: 6.5 g/dL (ref 6.0–8.3)

## 2013-09-07 LAB — IBC PANEL
Iron: 95 ug/dL (ref 42–145)
SATURATION RATIOS: 28.2 % (ref 20.0–50.0)
Transferrin: 240.9 mg/dL (ref 212.0–360.0)

## 2013-09-07 LAB — FERRITIN: FERRITIN: 238.1 ng/mL (ref 10.0–291.0)

## 2013-09-07 LAB — VITAMIN B12: VITAMIN B 12: 349 pg/mL (ref 211–911)

## 2013-09-07 LAB — TSH: TSH: 2.08 u[IU]/mL (ref 0.35–5.50)

## 2013-09-07 NOTE — Progress Notes (Signed)
Pre-visit discussion using our clinic review tool. No additional management support is needed unless otherwise documented below in the visit note.  

## 2013-09-08 ENCOUNTER — Encounter: Payer: Self-pay | Admitting: *Deleted

## 2013-09-11 ENCOUNTER — Encounter: Payer: Self-pay | Admitting: Internal Medicine

## 2013-09-11 NOTE — Assessment & Plan Note (Signed)
Blood pressure doing well on current regimen.  Follow.   

## 2013-09-11 NOTE — Assessment & Plan Note (Signed)
Has known anemia.  Declines any further w/up.  Discussed with her again today.  She declines.

## 2013-09-11 NOTE — Assessment & Plan Note (Signed)
Cr has been stable.  Follow.   

## 2013-09-11 NOTE — Assessment & Plan Note (Signed)
Low cholesterol diet.  Desires no medication.  Follow.    

## 2013-09-11 NOTE — Assessment & Plan Note (Signed)
Bone density 06/30/08 revealed interval decrease.  Received IV Reclast in March 2011.  Continue calcium and vitamin D.  Desires not to receive Reclast.

## 2013-09-11 NOTE — Progress Notes (Signed)
Subjective:    Patient ID: Kayla Galloway, female    DOB: 1921-05-02, 80 y.o.   MRN: [id number]  HPI 80 year old female with past history of hypertension, gout and hypercholesterolemia.  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  She also had a renal vein thrombosis with some renal insufficiency previously.  She comes in today for a scheduled follow up.   Followed by Dr Christella Noa.   No nausea or vomiting.  No abdominal pain.  Breathing stable.  Some fatigue.  Eating and drinking well.  Some lower extremity swelling.  Right > left.  Stable.      Past Medical History  Diagnosis Date  . Arthritis   . Osteoporosis   . Hypercholesterolemia   . Anemia   . GERD (gastroesophageal reflux disease)   . DVT (deep venous thrombosis), left     bilateral, IVC filter 2010  . Depression   . Gout   . Nephrolithiasis   . 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 Renal vein thrombosis     previous renal insufficiency  . Hypertension     Dr. [name]  . Cancer     skin ca lesion of scalp- removed  . Peripheral vascular disease   . Spider veins     Current Outpatient Prescriptions on File Prior to Visit  Medication Sig Dispense Refill  . acetaminophen (TYLENOL) 650 MG CR tablet Take 650 mg by mouth every 8 (eight) hours as needed.      . Calcium Carbonate-Vitamin D (CALCIUM 600 + D PO) Take 1 tablet by mouth 3 (three) times daily.      . furosemide (LASIX) 20 MG tablet Take 1 tablet (20 mg total) by mouth daily.  90 tablet  3  . HYDROcodone-acetaminophen (NORCO/VICODIN) 5-325 MG per tablet Take 1 tablet by mouth every 8 (eight) hours as needed.      Marland Kitchen omeprazole (PRILOSEC) 20 MG capsule Take 1 capsule by mouth  daily  90 capsule  1  . OVER THE COUNTER MEDICATION Take 1 tablet by mouth 2 (two) times daily. Osteo Bi-flex      . ramipril (ALTACE) 5 MG capsule Take 1 capsule (5 mg total)  by mouth daily.  90 capsule  1  . sertraline (ZOLOFT) 50 MG tablet Take 1 tablet (50 mg total) by mouth daily.  30 tablet  2   No current facility-administered medications on file prior to visit.    Review of Systems Patient denies any headache.  No significant lightheadedness or dizziness.  No chest pain, tightness or palpitations.  No increased shortness of breath, cough or congestion.  No nausea or vomiting.  No acid reflux.  No abdominal pain or cramping.  No bowel change, such as diarrhea, constipation, BRBPR or melana.  States the stool softeners are helping with constipation.  No urine change.  Does report decreased energy.  Persistent back pain.  Stable.       Objective:   Physical Exam  Filed Vitals:   09/07/13 1106  BP: 110/60  Pulse: 82  Temp: 97.6 F (31.42 C)   80 year old female in no acute distress.   HEENT:  Nares- clear.  Oropharynx - without lesions. NECK:  Supple.  Nontender.  No audible bruit.  HEART:  Appears to be regular. LUNGS:  No crackles or wheezing audible.  Respirations even and unlabored.  RADIAL PULSE:  Equal bilaterally.  ABDOMEN:  Soft, nontender.  Bowel sounds present and normal.  No audible abdominal bruit.    EXTREMITIES:  Bilateral pedal and ankle edema.  No increased erythema or warmth.            Assessment & Plan:  WEAKNESS.  Some decreased energy.  Appears to be getting around better.  Follow.  Declines any further w/up or treatment for her anemia.     MSK.  Seeing Dr Christella Noa.  S/p kyphoplasty.    CARDIOVASCULAR.  Stress test 10/25/10 negative with EF 60%.  Saw cardiology.  Continue risk factor modification.   INCREASED PSYCHOSOCIAL STRESSORS.  Started on Zoloft 03/11/12.  Appears to be stable.   Continue.    GYN.  Has seen Dr Rayford Halsted.  Was supposed to follow up with her.  She declines any further w/up.  Again discussed with her today regarding follow up scanning, etc.  She declines.    HEALTH MAINTENANCE.  Physical 12/28/12.  Colonoscopy  10/07 - normal.  She declines mammogram and further testing.

## 2014-03-29 ENCOUNTER — Telehealth: Payer: Self-pay | Admitting: Internal Medicine

## 2014-03-29 NOTE — Telephone Encounter (Signed)
I am not here this afternoon.  I do feel she needs to be seen today.  If no openings anywhere, then recommend acute care and then I can f/u with her after.

## 2014-03-29 NOTE — Telephone Encounter (Signed)
Please advise ([facility] does not have any openings)

## 2014-03-29 NOTE — Telephone Encounter (Signed)
The patient has a UTI she wanting to be seen today.

## 2014-03-29 NOTE — Telephone Encounter (Signed)
Pt.notified

## 2014-04-01 ENCOUNTER — Other Ambulatory Visit: Payer: Self-pay | Admitting: Internal Medicine

## 2014-04-06 ENCOUNTER — Encounter: Payer: Self-pay | Admitting: Internal Medicine

## 2014-04-06 ENCOUNTER — Ambulatory Visit (INDEPENDENT_AMBULATORY_CARE_PROVIDER_SITE_OTHER): Payer: Medicare Other | Admitting: Internal Medicine

## 2014-04-06 VITALS — BP 108/58 | HR 71 | Temp 97.6°F | Ht 61.5 in | Wt 116.2 lb

## 2014-04-06 DIAGNOSIS — R300 Dysuria: Secondary | ICD-10-CM

## 2014-04-06 DIAGNOSIS — N390 Urinary tract infection, site not specified: Secondary | ICD-10-CM

## 2014-04-06 LAB — POCT URINALYSIS DIPSTICK
Glucose, UA: NEGATIVE
Ketones, UA: 15
NITRITE UA: NEGATIVE
PROTEIN UA: 100
SPEC GRAV UA: 1.01
UROBILINOGEN UA: 0.2
pH, UA: 5

## 2014-04-06 MED ORDER — CIPROFLOXACIN HCL 250 MG PO TABS: 250.0000 mg | ORAL_TABLET | Freq: Two times a day (BID) | ORAL | Status: DC

## 2014-04-06 NOTE — Progress Notes (Signed)
Pre visit review using our clinic review tool, if applicable. No additional management support is needed unless otherwise documented below in the visit note. 

## 2014-04-06 NOTE — Progress Notes (Signed)
   Subjective:    Patient ID: Kayla Galloway, female    DOB: [birth year], 80 y.o.   MRN: [id number]  HPI 80YO female presents for acute visit.  Developed dysuria described as burning with urination early last week. Has been increasing fluid intake and drinking cranberry juice with no improvement. Seen at urgent care last Wednesday for UTI. Started on Macrobid. Had some improvement in dysuria for one night, then symptoms recurred. Completed Macrobid yesterday. No fever, chills, flank pain. Mild urinary urgency.  Review of Systems  Constitutional: Negative for fever, chills and fatigue.  Gastrointestinal: Negative for nausea, vomiting, abdominal pain, diarrhea, constipation and rectal pain.  Genitourinary: Positive for dysuria and urgency. Negative for frequency, hematuria, flank pain, decreased urine volume, vaginal bleeding, vaginal discharge, difficulty urinating, vaginal pain and pelvic pain.       Objective:    BP 108/58  Pulse 71  Temp(Src) 97.6 F (36.4 C) (Oral)  Ht 5' 1.5" (1.562 m)  Wt 116 lb 4 oz (52.731 kg)  BMI 21.61 kg/m2  SpO2 93% Physical Exam  Constitutional: She is oriented to person, place, and time. She appears well-developed and well-nourished. No distress.  HENT:  Head: Normocephalic and atraumatic.  Right Ear: External ear normal.  Left Ear: External ear normal.  Nose: Nose normal.  Mouth/Throat: Oropharynx is clear and moist.  Eyes: Conjunctivae are normal. Pupils are equal, round, and reactive to light. Right eye exhibits no discharge. Left eye exhibits no discharge. No scleral icterus.  Neck: Normal range of motion. Neck supple. No tracheal deviation present. No thyromegaly present.  Cardiovascular: Normal rate, regular rhythm, normal heart sounds and intact distal pulses.  Exam reveals no gallop and no friction rub.   No murmur heard. Pulmonary/Chest: Effort normal and breath sounds normal. No accessory muscle usage. Not tachypneic. No respiratory  distress. She has no decreased breath sounds. She has no wheezes. She has no rhonchi. She has no rales. She exhibits no tenderness.  Abdominal: There is no tenderness (no CVA tenderness).  Musculoskeletal: Normal range of motion. She exhibits no edema and no tenderness.  Lymphadenopathy:    She has no cervical adenopathy.  Neurological: She is alert and oriented to person, place, and time. No cranial nerve deficit. She exhibits normal muscle tone. Coordination normal.  Skin: Skin is warm and dry. No rash noted. She is not diaphoretic. No erythema. No pallor.  Psychiatric: She has a normal mood and affect. Her behavior is normal. Judgment and thought content normal.          Assessment & Plan:   Problem List Items Addressed This Visit   None    Visit Diagnoses   Dysuria    -  Primary    Relevant Orders       POCT Urinalysis Dipstick (Completed)        No Follow-up on file.

## 2014-04-06 NOTE — Assessment & Plan Note (Signed)
Symptoms and UA consistent with UTI. Will send urine for culture. Will start Cipro 250mg  bid x 7 days. Discussed risks of use of Macrobid in elderly pt and added this medication to her allergy list, to avoid use in the future. Encouraged fluid intake. Follow up with Dr. Nicki Reaper on Monday or early if needed for any worsening symptoms of dysuria, fever, chills or flank pain.

## 2014-04-06 NOTE — Patient Instructions (Signed)
Start Cipro 250mg  twice daily.  We will send a urine culture.  Follow up with Dr. Nicki Reaper on Monday.

## 2014-04-09 LAB — CULTURE, URINE COMPREHENSIVE

## 2014-04-10 ENCOUNTER — Encounter: Payer: Self-pay | Admitting: Internal Medicine

## 2014-04-10 ENCOUNTER — Ambulatory Visit (INDEPENDENT_AMBULATORY_CARE_PROVIDER_SITE_OTHER): Payer: Medicare Other | Admitting: Internal Medicine

## 2014-04-10 VITALS — BP 140/62 | HR 67 | Temp 97.8°F | Ht 61.5 in | Wt 114.5 lb

## 2014-04-10 DIAGNOSIS — R634 Abnormal weight loss: Secondary | ICD-10-CM

## 2014-04-10 DIAGNOSIS — E7800 Pure hypercholesterolemia, unspecified: Secondary | ICD-10-CM

## 2014-04-10 DIAGNOSIS — M810 Age-related osteoporosis without current pathological fracture: Secondary | ICD-10-CM

## 2014-04-10 DIAGNOSIS — N289 Disorder of kidney and ureter, unspecified: Secondary | ICD-10-CM

## 2014-04-10 DIAGNOSIS — N390 Urinary tract infection, site not specified: Secondary | ICD-10-CM

## 2014-04-10 DIAGNOSIS — D649 Anemia, unspecified: Secondary | ICD-10-CM

## 2014-04-10 DIAGNOSIS — I10 Essential (primary) hypertension: Secondary | ICD-10-CM

## 2014-04-10 LAB — CBC WITH DIFFERENTIAL/PLATELET
BASOS ABS: 0 10*3/uL (ref 0.0–0.1)
BASOS PCT: 0.3 % (ref 0.0–3.0)
EOS ABS: 0.3 10*3/uL (ref 0.0–0.7)
Eosinophils Relative: 4.6 % (ref 0.0–5.0)
HCT: 30.3 % — ABNORMAL LOW (ref 36.0–46.0)
Hemoglobin: 10.3 g/dL — ABNORMAL LOW (ref 12.0–15.0)
LYMPHS PCT: 28.9 % (ref 12.0–46.0)
Lymphs Abs: 2.1 10*3/uL (ref 0.7–4.0)
MCHC: 33.9 g/dL (ref 30.0–36.0)
MCV: 97.4 fl (ref 78.0–100.0)
MONO ABS: 0.8 10*3/uL (ref 0.1–1.0)
Monocytes Relative: 10.9 % (ref 3.0–12.0)
Neutro Abs: 4 10*3/uL (ref 1.4–7.7)
Neutrophils Relative %: 55.3 % (ref 43.0–77.0)
Platelets: 202 10*3/uL (ref 150.0–400.0)
RBC: 3.11 Mil/uL — AB (ref 3.87–5.11)
RDW: 13.8 % (ref 11.5–15.5)
WBC: 7.2 10*3/uL (ref 4.0–10.5)

## 2014-04-10 LAB — COMPREHENSIVE METABOLIC PANEL
ALK PHOS: 50 U/L (ref 39–117)
ALT: 16 U/L (ref 0–35)
AST: 23 U/L (ref 0–37)
Albumin: 3.6 g/dL (ref 3.5–5.2)
BUN: 22 mg/dL (ref 6–23)
CO2: 26 mEq/L (ref 19–32)
Calcium: 9.5 mg/dL (ref 8.4–10.5)
Chloride: 97 mEq/L (ref 96–112)
Creatinine, Ser: 1.1 mg/dL (ref 0.4–1.2)
GFR: 49.5 mL/min — ABNORMAL LOW (ref >60.00)
Glucose, Bld: 79 mg/dL (ref 70–99)
POTASSIUM: 4.1 meq/L (ref 3.5–5.1)
Sodium: 133 mEq/L — ABNORMAL LOW (ref 135–145)
Total Bilirubin: 0.4 mg/dL (ref 0.2–1.2)
Total Protein: 6.7 g/dL (ref 6.0–8.3)

## 2014-04-10 LAB — TSH: TSH: 1.72 u[IU]/mL (ref 0.35–4.50)

## 2014-04-10 NOTE — Progress Notes (Signed)
Pre visit review using our clinic review tool, if applicable. No additional management support is needed unless otherwise documented below in the visit note. 

## 2014-04-11 ENCOUNTER — Encounter: Payer: Self-pay | Admitting: Internal Medicine

## 2014-04-11 ENCOUNTER — Other Ambulatory Visit: Payer: Self-pay | Admitting: Internal Medicine

## 2014-04-11 DIAGNOSIS — R634 Abnormal weight loss: Secondary | ICD-10-CM | POA: Insufficient documentation

## 2014-04-11 DIAGNOSIS — E871 Hypo-osmolality and hyponatremia: Secondary | ICD-10-CM

## 2014-04-11 DIAGNOSIS — N289 Disorder of kidney and ureter, unspecified: Secondary | ICD-10-CM

## 2014-04-11 NOTE — Assessment & Plan Note (Signed)
Blood pressure doing well on current regimen.  Follow.   

## 2014-04-11 NOTE — Assessment & Plan Note (Signed)
Weight down from last check.  Encourage increased po intake.  Boost.  Follow weight.  Get her back in soon to reassess.

## 2014-04-11 NOTE — Assessment & Plan Note (Signed)
Cr has been stable.   Follow.  Recheck today.

## 2014-04-11 NOTE — Assessment & Plan Note (Signed)
On cipro.  Symptoms better.  Follow.

## 2014-04-11 NOTE — Progress Notes (Signed)
Order placed for f/u met b.  

## 2014-04-11 NOTE — Progress Notes (Signed)
Subjective:    Patient ID: Kayla Galloway, female    DOB: [birth year], 80 y.o.   MRN: [id number]  HPI 80 year old female with past history of hypertension, gout and hypercholesterolemia.  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  She also had a renal vein thrombosis with some renal insufficiency previously.  She comes in today for a scheduled follow up.   Followed by Dr Christella Noa.   Still with back pain.  Manages with prn hydrocodone prescribed by Dr Christella Noa.  Has f/u with him 11/15.  Reports no nausea or vomiting.  No abdominal pain.  Breathing stable.  Some fatigue.  She reports eating and drinking well.  Weight is down.  In 9/14 - weight 120 pounds.   Just evaluated last week and diagnosed with uti.  On cipro.  Some hear stools at times.  Controls this with taking a stool softener.        Past Medical History  Diagnosis Date  . Arthritis   . Osteoporosis   . Hypercholesterolemia   . Anemia   . GERD (gastroesophageal reflux disease)   . DVT (deep venous thrombosis), left     bilateral, IVC filter 2010  . Depression   . Gout   . Nephrolithiasis   . 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 Renal vein thrombosis     previous renal insufficiency  . Hypertension     Dr. [name]  . Cancer     skin ca lesion of scalp- removed  . Peripheral vascular disease   . Spider veins     Current Outpatient Prescriptions on File Prior to Visit  Medication Sig Dispense Refill  . acetaminophen (TYLENOL) 650 MG CR tablet Take 650 mg by mouth every 8 (eight) hours as needed.      . Calcium Carbonate-Vitamin D (CALCIUM 600 + D PO) Take 1 tablet by mouth 3 (three) times daily.      . ciprofloxacin (CIPRO) 250 MG tablet Take 1 tablet (250 mg total) by mouth 2 (two) times daily.  14 tablet  0  . furosemide (LASIX) 20 MG tablet Take 1 tablet (20 mg total) by mouth daily.  90 tablet  0  .  HYDROcodone-acetaminophen (NORCO/VICODIN) 5-325 MG per tablet Take 1 tablet by mouth every 8 (eight) hours as needed.      Marland Kitchen omeprazole (PRILOSEC) 20 MG capsule Take 1 capsule by mouth  daily  90 capsule  0  . OVER THE COUNTER MEDICATION Take 1 tablet by mouth 2 (two) times daily. Osteo Bi-flex      . ramipril (ALTACE) 5 MG capsule Take 1 capsule by mouth  daily  90 capsule  0  . sertraline (ZOLOFT) 50 MG tablet Take 1 tablet by mouth  daily  90 tablet  0   No current facility-administered medications on file prior to visit.    Review of Systems Patient denies any headache.  No significant lightheadedness or dizziness.  No chest pain, tightness or palpitations.  No increased shortness of breath, cough or congestion.  No nausea or vomiting.  No acid reflux.  No abdominal pain or cramping.  Weight is down some.  No bowel change, such as diarrhea, BRBPR or melana.  States the stool softeners are helping with constipation.  Diagnosed with uti last visit.  On cipro.  Burning better.  Does report decreased energy.  Persistent back pain.  Stable.       Objective:   Physical Exam  Filed Vitals:   04/10/14 1103  BP: 140/62  Pulse: 67  Temp: 97.8 F (36.6 C)   Blood pressure recheck:  9/82  80 year old female in no acute distress.   HEENT:  Nares- clear.  Oropharynx - without lesions. NECK:  Supple.  Nontender.  No audible bruit.  HEART:  Appears to be regular. LUNGS:  No crackles or wheezing audible.  Respirations even and unlabored.  RADIAL PULSE:  Equal bilaterally.  ABDOMEN:  Soft, nontender.  Bowel sounds present and normal.  No audible abdominal bruit.    EXTREMITIES:  No increased swelling.  Stable - maybe slightly improved.    No increased erythema or warmth.            Assessment & Plan:  MSK.  Seeing Dr Christella Noa.  S/p kyphoplasty.    CARDIOVASCULAR.  Stress test 10/25/10 negative with EF 60%.  Saw cardiology.  Continue risk factor modification.   INCREASED PSYCHOSOCIAL  STRESSORS.  On Zoloft 03/11/12.  Appears to be stable.   Continue.    GYN.  Has seen Dr Rayford Halsted.  Was supposed to follow up with her.  She declines any further w/up.  Have discussed with her regarding follow up scanning, etc.  She declines.    HEALTH MAINTENANCE.  Physical 12/28/12.  Colonoscopy 10/07 - normal.  She declines mammogram and further testing.    I spent 25 minutes with the patient and more than 50% of the time was spent in consultation regarding the above.

## 2014-04-11 NOTE — Assessment & Plan Note (Signed)
Has known anemia.  Declines any further w/up.  Have discussed this with her on multiple occasions.  She declines.  Check cbc today.

## 2014-04-11 NOTE — Assessment & Plan Note (Signed)
Low cholesterol diet.  Desires no medication.  Follow.

## 2014-04-11 NOTE — Assessment & Plan Note (Signed)
Bone density 06/30/08 revealed interval decrease.  Received IV Reclast in March 2011.  Continue calcium and vitamin D.  Desires not to receive Reclast.  Declines further testing.

## 2014-04-18 ENCOUNTER — Other Ambulatory Visit: Payer: Self-pay | Admitting: Internal Medicine

## 2014-04-18 ENCOUNTER — Other Ambulatory Visit (INDEPENDENT_AMBULATORY_CARE_PROVIDER_SITE_OTHER): Payer: Medicare Other

## 2014-04-18 DIAGNOSIS — R8281 Pyuria: Secondary | ICD-10-CM

## 2014-04-18 DIAGNOSIS — N289 Disorder of kidney and ureter, unspecified: Secondary | ICD-10-CM

## 2014-04-18 DIAGNOSIS — E871 Hypo-osmolality and hyponatremia: Secondary | ICD-10-CM

## 2014-04-18 LAB — URINALYSIS, ROUTINE W REFLEX MICROSCOPIC
Bilirubin Urine: NEGATIVE
Hgb urine dipstick: NEGATIVE
Ketones, ur: NEGATIVE
NITRITE: NEGATIVE
PH: 6 (ref 5.0–8.0)
RBC / HPF: NONE SEEN (ref 0–?)
Specific Gravity, Urine: 1.015 (ref 1.000–1.030)
TOTAL PROTEIN, URINE-UPE24: NEGATIVE
URINE GLUCOSE: NEGATIVE
Urobilinogen, UA: 0.2 (ref 0.0–1.0)

## 2014-04-18 LAB — BASIC METABOLIC PANEL
BUN: 16 mg/dL (ref 6–23)
CHLORIDE: 97 meq/L (ref 96–112)
CO2: 30 mEq/L (ref 19–32)
Calcium: 9 mg/dL (ref 8.4–10.5)
Creatinine, Ser: 1 mg/dL (ref 0.4–1.2)
GFR: 57.23 mL/min — ABNORMAL LOW (ref >60.00)
Glucose, Bld: 113 mg/dL — ABNORMAL HIGH (ref 70–99)
POTASSIUM: 3.8 meq/L (ref 3.5–5.1)
Sodium: 132 mEq/L — ABNORMAL LOW (ref 135–145)

## 2014-04-18 NOTE — Progress Notes (Signed)
Order placed for urine culture 

## 2014-04-18 NOTE — Addendum Note (Signed)
Addended by: Karlene Einstein D on: 04/18/2014 03:37 PM   Modules accepted: Orders

## 2014-04-21 ENCOUNTER — Other Ambulatory Visit: Payer: Self-pay | Admitting: *Deleted

## 2014-04-21 MED ORDER — AMOXICILLIN 500 MG PO CAPS: 500.0000 mg | ORAL_CAPSULE | Freq: Two times a day (BID) | ORAL | Status: DC

## 2014-04-22 LAB — CULTURE, URINE COMPREHENSIVE: Colony Count: >=100000

## 2014-05-04 ENCOUNTER — Other Ambulatory Visit: Payer: Self-pay | Admitting: Internal Medicine

## 2014-05-04 ENCOUNTER — Other Ambulatory Visit: Payer: Self-pay | Admitting: *Deleted

## 2014-05-16 ENCOUNTER — Telehealth: Payer: Self-pay

## 2014-05-16 ENCOUNTER — Ambulatory Visit (INDEPENDENT_AMBULATORY_CARE_PROVIDER_SITE_OTHER): Payer: Medicare Other | Admitting: Internal Medicine

## 2014-05-16 ENCOUNTER — Encounter: Payer: Self-pay | Admitting: Internal Medicine

## 2014-05-16 VITALS — BP 110/78 | HR 71 | Temp 97.5°F | Ht 61.5 in | Wt 113.5 lb

## 2014-05-16 DIAGNOSIS — N390 Urinary tract infection, site not specified: Secondary | ICD-10-CM

## 2014-05-16 DIAGNOSIS — L293 Anogenital pruritus, unspecified: Secondary | ICD-10-CM

## 2014-05-16 DIAGNOSIS — N898 Other specified noninflammatory disorders of vagina: Secondary | ICD-10-CM

## 2014-05-16 DIAGNOSIS — I10 Essential (primary) hypertension: Secondary | ICD-10-CM

## 2014-05-16 DIAGNOSIS — R350 Frequency of micturition: Secondary | ICD-10-CM

## 2014-05-16 DIAGNOSIS — R634 Abnormal weight loss: Secondary | ICD-10-CM

## 2014-05-16 LAB — POCT URINALYSIS DIPSTICK
BILIRUBIN UA: NEGATIVE
Glucose, UA: NEGATIVE
KETONES UA: NEGATIVE
Nitrite, UA: NEGATIVE
PROTEIN UA: NEGATIVE
RBC UA: NEGATIVE
Urobilinogen, UA: 0.2
pH, UA: 5

## 2014-05-16 MED ORDER — CIPROFLOXACIN HCL 250 MG PO TABS: 250.0000 mg | ORAL_TABLET | Freq: Two times a day (BID) | ORAL | Status: DC

## 2014-05-16 NOTE — Telephone Encounter (Signed)
Spoke with sister [name], appt scheduled for today, verbalized understanding.

## 2014-05-16 NOTE — Telephone Encounter (Signed)
Please advise 

## 2014-05-16 NOTE — Telephone Encounter (Signed)
See if can come in today - open 30 minute slot.

## 2014-05-16 NOTE — Progress Notes (Signed)
Pre visit review using our clinic review tool, if applicable. No additional management support is needed unless otherwise documented below in the visit note. 

## 2014-05-16 NOTE — Telephone Encounter (Signed)
The patient's care taker called and stated the patient is not "tolerating the amoxicillin" she was prescribed.  She states she still has burning and her UTI symptoms are not better.    Callback for Kayla Galloway - [phone]

## 2014-05-17 ENCOUNTER — Encounter: Payer: Self-pay | Admitting: Internal Medicine

## 2014-05-17 LAB — WET PREP BY MOLECULAR PROBE
Candida species: NEGATIVE
GARDNERELLA VAGINALIS: NEGATIVE
TRICHOMONAS VAG: NEGATIVE

## 2014-05-17 NOTE — Progress Notes (Signed)
Subjective:    Patient ID: Kayla Galloway, female    DOB: May 31, 1921, 80 y.o.   MRN: [id number]  Urinary Tract Infection   80 year old female with past history of hypertension, gout and hypercholesterolemia.  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  She also had a renal vein thrombosis with some renal insufficiency previously.  She comes in today as a work in with concerns regarding persistent burning - with urination.   Was seen initially 03/29/14 at acute care.  Treated with macrobid.  F/u here and treated with cipro.  Still with symptoms and changed to amoxicillin.  Feels amoxicillin made her worse.  Stopped after a few days.  Describes has minima lower abdominal pressure.  Some constipation.  Controls with taking stool softener.   Also has persistent burning.  Worse with urination.  Worse in the am.  Unclear if vaginal burning.  States feels burning - up in vaginal area.   She reports eating and drinking well.  No change in back pain.  No vomiting.        Past Medical History  Diagnosis Date  . Arthritis   . Osteoporosis   . Hypercholesterolemia   . Anemia   . GERD (gastroesophageal reflux disease)   . DVT (deep venous thrombosis), left     bilateral, IVC filter 2010  . Depression   . Gout   . Nephrolithiasis   . 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 Renal vein thrombosis     previous renal insufficiency  . Hypertension     Dr. [name]  . Cancer     skin ca lesion of scalp- removed  . Peripheral vascular disease   . Spider veins     Current Outpatient Prescriptions on File Prior to Visit  Medication Sig Dispense Refill  . acetaminophen (TYLENOL) 650 MG CR tablet Take 650 mg by mouth every 8 (eight) hours as needed.      . Calcium Carbonate-Vitamin D (CALCIUM 600 + D PO) Take 1 tablet by mouth 3 (three) times daily.      . furosemide (LASIX) 20 MG tablet  Take 1 tablet (20 mg total) by mouth daily.  90 tablet  0  . HYDROcodone-acetaminophen (NORCO/VICODIN) 5-325 MG per tablet Take 1 tablet by mouth every 8 (eight) hours as needed.      Marland Kitchen omeprazole (PRILOSEC) 20 MG capsule Take 1 capsule by mouth  daily  90 capsule  0  . OVER THE COUNTER MEDICATION Take 1 tablet by mouth 2 (two) times daily. Osteo Bi-flex      . ramipril (ALTACE) 5 MG capsule Take 1 capsule by mouth  daily  90 capsule  0  . sertraline (ZOLOFT) 50 MG tablet Take 1 tablet by mouth  daily  90 tablet  0   No current facility-administered medications on file prior to visit.    Review of Systems Patient denies fever.   No nausea or vomiting.  No acid reflux.  No abdominal pain or cramping.  Minimal abdominal pressure.  No bowel change, such as diarrhea, BRBPR or melana.  States the stool softeners are helping with constipation.   Persistent burning as outlined.  No discharge or bleeding.       Objective:   Physical Exam  Filed Vitals:   05/16/14 1536  BP: 110/78  Pulse: 71  Temp: 97.5 F (36.4  C)   80 year old female in no acute distress.  NECK:  Supple.  Nontender.   HEART:  Appears to be regular. LUNGS:  No crackles or wheezing audible.  Respirations even and unlabored.  RADIAL PULSE:  Equal bilaterally.  ABDOMEN:  Soft, nontender.  Bowel sounds present and normal.  No audible abdominal bruit.  GU:  Normal external genitalia.  Vaginal vault without lesions.  Atrophy changes present.  Wet prep sent.   Could not appreciate any adnexal masses or tenderness.  Difficult exam.  EXTREMITIES:  No increased edema present.  DP pulses palpable and equal bilaterally.           Assessment & Plan:  GYN.  Has seen Dr Rayford Halsted.  Was supposed to follow up with her.  She declines any further w/up.  Have discussed with her regarding follow up scanning, etc.  She declines.  Pelvic today.   HEALTH MAINTENANCE.  Physical 12/28/12.  Colonoscopy 10/07 - normal.  She declines mammogram and  further testing.    I spent 25 minutes with the patient and more than 50% of the time was spent in consultation regarding the above.

## 2014-05-17 NOTE — Assessment & Plan Note (Signed)
Weight down from last check.  Encourage increased po intake. Follow weight.

## 2014-05-17 NOTE — Assessment & Plan Note (Signed)
Blood pressure doing well on current regimen.  Follow.   

## 2014-05-17 NOTE — Assessment & Plan Note (Signed)
Persistent burning as outlined.  Exam as outlined.  Urine dip with trace leukocytes.  Add culture.  Wet prep obtained.  Treat with cipro 250mg  as directed.  Follow.  She desires not to pursue further gyn evaluation.

## 2014-05-19 LAB — CULTURE, URINE COMPREHENSIVE: Colony Count: >=100000

## 2014-05-22 ENCOUNTER — Other Ambulatory Visit: Payer: Self-pay | Admitting: *Deleted

## 2014-05-22 MED ORDER — CIPROFLOXACIN HCL 250 MG PO TABS: 250.0000 mg | ORAL_TABLET | Freq: Two times a day (BID) | ORAL | Status: DC

## 2014-05-22 MED ORDER — ALIGN PO CAPS: 1.0000 | ORAL_CAPSULE | Freq: Every day | ORAL | Status: DC

## 2014-07-04 ENCOUNTER — Other Ambulatory Visit: Payer: Self-pay | Admitting: Internal Medicine

## 2014-07-11 ENCOUNTER — Ambulatory Visit: Payer: Medicare Other | Admitting: Internal Medicine

## 2014-07-11 ENCOUNTER — Telehealth: Payer: Self-pay | Admitting: Internal Medicine

## 2014-07-11 ENCOUNTER — Emergency Department: Payer: Self-pay | Admitting: Emergency Medicine

## 2014-07-11 NOTE — Telephone Encounter (Signed)
Please advise 

## 2014-07-11 NOTE — Telephone Encounter (Signed)
okay

## 2014-07-11 NOTE — Telephone Encounter (Deleted)
Patient's care giver, Barrie Lyme, would like for you to give her a call with the appointment time for the stitches to be taken out and the patient's physical appointment.

## 2014-07-11 NOTE — Telephone Encounter (Addendum)
Patient had stitches put in on the calf of her left leg at the ER and needs them taken out within 7-10 days and she would also like to have her physical done at the same time, but there are no open appointments.  Please advise.  Patient's care giver, Barrie Lyme, would like for you to give her a call with the appointment time for the stitches to be taken out and the patient's physical appointment.

## 2014-07-11 NOTE — Telephone Encounter (Addendum)
Pt daughter called to stated that the pt fell and will not be able to come in today.  Daughter stated that she will call back to reschedule appt. Please advise okay to cancel off schedule for today.msn

## 2014-07-12 NOTE — Telephone Encounter (Signed)
Pt notified. appt scheduled.

## 2014-07-12 NOTE — Telephone Encounter (Signed)
Duplicate.  See attached.   

## 2014-07-12 NOTE — Telephone Encounter (Signed)
I can see her on 07/20/14 at 2:30.  Will see if sutures ready to be removed.  Will do ER f/u.  Will probably need to do her cpe at another time.  Need to address acute issues first.

## 2014-07-19 ENCOUNTER — Other Ambulatory Visit: Payer: Self-pay | Admitting: *Deleted

## 2014-07-19 MED ORDER — FUROSEMIDE 20 MG PO TABS: ORAL_TABLET | ORAL | Status: DC

## 2014-07-20 ENCOUNTER — Encounter: Payer: Self-pay | Admitting: Internal Medicine

## 2014-07-20 ENCOUNTER — Ambulatory Visit (INDEPENDENT_AMBULATORY_CARE_PROVIDER_SITE_OTHER): Payer: Medicare Other | Admitting: Internal Medicine

## 2014-07-20 VITALS — BP 120/90 | HR 64 | Temp 97.6°F | Ht 61.5 in | Wt 118.5 lb

## 2014-07-20 DIAGNOSIS — S90552A Superficial foreign body, left ankle, initial encounter: Secondary | ICD-10-CM

## 2014-07-20 DIAGNOSIS — S70352A Superficial foreign body, left thigh, initial encounter: Secondary | ICD-10-CM

## 2014-07-20 DIAGNOSIS — IMO0001 Reserved for inherently not codable concepts without codable children: Secondary | ICD-10-CM

## 2014-07-20 DIAGNOSIS — S70252A Superficial foreign body, left hip, initial encounter: Secondary | ICD-10-CM

## 2014-07-20 DIAGNOSIS — S80852A Superficial foreign body, left lower leg, initial encounter: Secondary | ICD-10-CM

## 2014-07-20 DIAGNOSIS — S81812D Laceration without foreign body, left lower leg, subsequent encounter: Secondary | ICD-10-CM

## 2014-07-20 DIAGNOSIS — S41112D Laceration without foreign body of left upper arm, subsequent encounter: Secondary | ICD-10-CM

## 2014-07-20 MED ORDER — SULFAMETHOXAZOLE-TRIMETHOPRIM 800-160 MG PO TABS: 1.0000 | ORAL_TABLET | Freq: Two times a day (BID) | ORAL | Status: DC

## 2014-07-20 NOTE — Progress Notes (Signed)
Pre visit review using our clinic review tool, if applicable. No additional management support is needed unless otherwise documented below in the visit note. 

## 2014-07-21 ENCOUNTER — Telehealth: Payer: Self-pay | Admitting: *Deleted

## 2014-07-21 NOTE — Telephone Encounter (Signed)
[name] called to report that Ms. Brumm was scheduled at the [facility] in 2 weeks. It was her understanding that you wanted this sooner than that. She also states that it is hurting pretty bad this morning. She would like a callback regarding an earlier appt asap. Please advise.

## 2014-07-21 NOTE — Telephone Encounter (Signed)
Left detailed message on Kayla Galloway VM advising of pts appoint.  Requested a return call once received the VM

## 2014-07-21 NOTE — Telephone Encounter (Signed)
The wound center here can see her at 2:45 on Monday.  Make sure this is ok with this.  If increased problems over the weekend, then to acute care or [facility].

## 2014-07-21 NOTE — Telephone Encounter (Signed)
Spoke with Barrie Lyme, she acknowledged and verbalized understanding.

## 2014-07-24 ENCOUNTER — Encounter: Payer: Self-pay | Admitting: Surgery

## 2014-07-24 ENCOUNTER — Encounter: Payer: Self-pay | Admitting: Internal Medicine

## 2014-07-24 DIAGNOSIS — S41112A Laceration without foreign body of left upper arm, initial encounter: Secondary | ICD-10-CM | POA: Insufficient documentation

## 2014-07-24 DIAGNOSIS — S81812A Laceration without foreign body, left lower leg, initial encounter: Secondary | ICD-10-CM | POA: Insufficient documentation

## 2014-07-24 NOTE — Progress Notes (Addendum)
Subjective:    Patient ID: Kayla Galloway, female    DOB: 02-05-1921, 80 y.o.   MRN: [id number]  HPI 80 year old female with past history of hypertension, gout and hypercholesterolemia.  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  She also had a renal vein thrombosis with some renal insufficiency previously.  She comes in today for an ER follow up.   Followed by Dr Christella Noa for chronic back pain.  Manages with prn hydrocodone prescribed by Dr Christella Noa.  She she was seen in the ER on 07/11/14 after falling at her home.  Tripped on cement stairs.  Had a left leg laceration and left upper extremity laceration.  No head injury of LOC.  Had some low back pain initially.  Has chronic back pain as outlined.  Xray unrevealing of an acute injury.  In ER the left lower extremity laceration was repaired with documented 6 sutures and steri strips. She has kept the leg bandaged.  Here today for reevaluation and to have sutures removed if ready.  Some pain in the lower leg around the open wound.         Past Medical History  Diagnosis Date  . Arthritis   . Osteoporosis   . Hypercholesterolemia   . Anemia   . GERD (gastroesophageal reflux disease)   . DVT (deep venous thrombosis), left     bilateral, IVC filter 2010  . Depression   . Gout   . Nephrolithiasis   . 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 Renal vein thrombosis     previous renal insufficiency  . Hypertension     Dr. [name]  . Cancer     skin ca lesion of scalp- removed  . Peripheral vascular disease   . Spider veins     Current Outpatient Prescriptions on File Prior to Visit  Medication Sig Dispense Refill  . acetaminophen (TYLENOL) 650 MG CR tablet Take 650 mg by mouth every 8 (eight) hours as needed.    . bifidobacterium infantis (ALIGN) capsule Take 1 capsule by mouth daily. 30 capsule 0  . Calcium  Carbonate-Vitamin D (CALCIUM 600 + D PO) Take 1 tablet by mouth 3 (three) times daily.    . furosemide (LASIX) 20 MG tablet Take 1 tablet (20 mg total) by mouth daily. 30 tablet 5  . HYDROcodone-acetaminophen (NORCO/VICODIN) 5-325 MG per tablet Take 1 tablet by mouth every 8 (eight) hours as needed.    Marland Kitchen omeprazole (PRILOSEC) 20 MG capsule Take 1 capsule by mouth  daily 90 capsule 0  . OVER THE COUNTER MEDICATION Take 1 tablet by mouth 2 (two) times daily. Osteo Bi-flex    . ramipril (ALTACE) 5 MG capsule Take 1 capsule by mouth  daily 90 capsule 1  . sertraline (ZOLOFT) 50 MG tablet Take 1 tablet by mouth  daily 90 tablet 0   No current facility-administered medications on file prior to visit.    Review of Systems no head injury.  No LOC.  Recent fall as outlined.  No fever.  No nausea or vomiting.  No abdominal pain or cramping.  Weight is down some.  No bowel change, such as diarrhea.  Left lower extremity laceration.  Has been kept covered.  Some increased lower extremity swelling.  She has been off her lasix.  Has not been keeping her legs elevated.  Left upper arm  she has left open and appears to be healing well.  Tolerated bactrim given in the ER.  Up to date with tetanus per their documentation.       Objective:   Physical Exam  Filed Vitals:   07/20/14 1429  BP: 120/90  Pulse: 64  Temp: 97.6 F (47.67 C)   80 year old female in no acute distress. NECK:  Supple.  Nontender.   HEART:  Appears to be regular. LUNGS:  No crackles or wheezing audible.  Respirations even and unlabored.  EXTREMITIES:  Increased lower extremity edema.  Large lower leg laceration.  Sutures in place.  Steri strips in place.  It appears that the skin (with the increased swelling) has pulled away.  Surrounding increased erythema.  Increased tenderness to palpation.           Assessment & Plan:   Laceration of left leg, subsequent encounter Recent fall as outlined.  Initially evaluated in the ER.  States  6 sutures placed.  3 sutures removed today.  I can visualize 2 sutures, which I am going to leave in place.  They appear to be holding the central tissue together.   A few steri strips ready for removal and pulled easily off.  Will have her restart her lasix.  The swelling is aggravating the healing and pulling the tissue apart.  Have her leave the area open some throughout the day.  Leg elevation.  Extend bactrim DS bid for another week.  Instructed to take a probiotic daily.  Refer to wound clinic.    Laceration of left upper extremity, subsequent encounter Healing.  No further intervention.  Leave open.    EDEMA.  Restart lasix as outlined.    MSK.  Seeing Dr Christella Noa.  S/p kyphoplasty.   Xray as outlined.  Back appears to be stable.     I spent 25 minutes with the patient and more than 50% of the time was spent in consultation regarding the above.    ADDENDUM.  The patient is going to be seen in the wound clinic on 07/24/14.  Will have remainder of sutures removed then.

## 2014-08-01 ENCOUNTER — Encounter: Payer: Self-pay | Admitting: Internal Medicine

## 2014-08-03 ENCOUNTER — Encounter (HOSPITAL_BASED_OUTPATIENT_CLINIC_OR_DEPARTMENT_OTHER): Payer: Medicare Other

## 2014-08-07 ENCOUNTER — Encounter: Payer: Medicare Other | Admitting: Nurse Practitioner

## 2014-08-15 ENCOUNTER — Ambulatory Visit (INDEPENDENT_AMBULATORY_CARE_PROVIDER_SITE_OTHER): Payer: Medicare Other | Admitting: Nurse Practitioner

## 2014-08-15 ENCOUNTER — Encounter: Payer: Self-pay | Admitting: Nurse Practitioner

## 2014-08-15 VITALS — BP 132/60 | HR 68 | Temp 97.5°F | Resp 14 | Ht 62.0 in | Wt 120.4 lb

## 2014-08-15 DIAGNOSIS — Z139 Encounter for screening, unspecified: Secondary | ICD-10-CM

## 2014-08-15 DIAGNOSIS — Z0000 Encounter for general adult medical examination without abnormal findings: Secondary | ICD-10-CM

## 2014-08-15 DIAGNOSIS — N390 Urinary tract infection, site not specified: Secondary | ICD-10-CM

## 2014-08-15 DIAGNOSIS — R309 Painful micturition, unspecified: Secondary | ICD-10-CM

## 2014-08-15 LAB — POCT URINALYSIS DIPSTICK
BILIRUBIN UA: NEGATIVE
Blood, UA: NEGATIVE
Glucose, UA: NEGATIVE
Ketones, UA: NEGATIVE
Leukocytes, UA: NEGATIVE
NITRITE UA: NEGATIVE
PH UA: 5.5
Protein, UA: NEGATIVE
Spec Grav, UA: 1.01
UROBILINOGEN UA: 0.2

## 2014-08-15 NOTE — Assessment & Plan Note (Signed)
Urine POCT was negative for signs of infection. Discussed looking at sugar content in Cranberry juice and choosing a low sugar/no sugar brand for urinary health. Culture sent just to make sure no growth. Instructed pt to return if not improving or if worsens.

## 2014-08-15 NOTE — Progress Notes (Signed)
Pre visit review using our clinic review tool, if applicable. No additional management support is needed unless otherwise documented below in the visit note. 

## 2014-08-15 NOTE — Patient Instructions (Signed)
You had your wellness visit today.   You do not have a Urinary Tract Infection today. Try sugar free or low sugar Cranberry Juice. If you keep having burning or other problems with urination please make another visit.   Happy New Year!

## 2014-08-15 NOTE — Progress Notes (Signed)
Subjective:    Patient ID: Kayla Galloway, female    DOB: 17-Jan-1921, 80 y.o.   MRN: [id number]  HPI Health Screenings  Mammogram- None recently due to age.  Pap Smear-None recently due to age.  Colonoscopy- In past, no issues she reports Bone Density- Years ago had one. Many Kyphoplasty surgeries Glaucoma- 12/2013, normal Hearing- some trouble, Saw ENT Hemoglobin A1C- Not recent due to age, Glucose 113 04/18/14 Cholesterol- 2013 last one. Refused further work up  Social  Alcohol intake- Wine 0.5 oz few x weekly Smoking history- Denies Smokers in home- Denies Illicit drug use- Denies Exercise- Not much, seated exercises Diet- Cooks at home, neighbor cooks and eats with patient Sexually Active- Denies Multiple Partners- Denies  Safety  Patient feesl safe at home- Yes Patient does have smoke detectors at home- Yes Patient does wear sunscreen or protective clothing when in direct sunlight- Yes Patient does wear seat belt when driving or riding with others- Yes  Activities of Daily Living Patient can do their own household chores- A person comes and helps clean once a month. She does other 3 weeks worth  Denies needing assistance with: driving, feeding themselves, getting from bed to chair, getting to the toilet, bathing/showering, dressing, managing money, or preparing meals.  climbing flight of stairs- does not do stairs.   Depression Screen Patient denies losing interest in daily life, feeling hopeless, or crying easily over simple problems.  Fall Screen Patient denies being afraid of falling or falling in the last year. YES- November 24th, lost balance, ER by ambulance and was bandaged. Pt is ambulating with Walker. She has neighbor looking after her. She was outside when she fell and was distracted by a Hornet she reports.   Memory Screen Patient denies problems with memory, misplacing items, and is able to balance checkbook/bank accounts. Patient is alert, normal  appearance, oriented to person/place/and time. Correctly identified the president of the [place], recall of 2/3 objects, and performing simple calculations.  Patient displays appropriate judgement and can read correct time from watch face.   Immunizations The following Immunizations are up to date: Influenza- refused, shingles-had in past, pneumonia- UTD, and tetanus- in past in 31 years.   Other Providers:  [facility]- Dr. Pryor Ochoa  Dr. Francis Dowse [facility]  HPI- Acute Issue  Acute Issues- Onset 2 weeks ago  Burning on urination, last treated with Cipro in September. Patient concerned about urgency. She is drinking Cranberry Juice.   Review of Systems  Constitutional: Negative for fever, diaphoresis and fatigue.  Gastrointestinal: Negative for nausea, vomiting, abdominal pain, diarrhea, constipation and abdominal distention.  Genitourinary: Positive for dysuria and urgency. Negative for frequency, hematuria and flank pain.  Skin: Negative for rash.   Past Medical History  Diagnosis Date  . Arthritis   . Osteoporosis   . Hypercholesterolemia   . Anemia   . GERD (gastroesophageal reflux disease)   . DVT (deep venous thrombosis), left     bilateral, IVC filter 2010  . Depression   . Gout   . Nephrolithiasis   . 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 Renal vein thrombosis     previous renal insufficiency  . Hypertension     Dr. [name]  . Cancer     skin ca lesion of scalp- removed  . Peripheral vascular disease   . Spider veins     History   Social History  .  Marital Status: Widowed    Spouse Name: N/A    Number of Children: 0  . Years of Education: N/A   Occupational History  . Not on file.   Social History Main Topics  . Smoking status: Never Smoker   . Smokeless tobacco: Never Used  . Alcohol Use: No  . Drug Use: No  . Sexual Activity: Not on file   Other Topics Concern  . Not on  file   Social History Narrative    Past Surgical History  Procedure Laterality Date  . Fracture surgery  2009    hip  . Right oophorectomy      partial-  . Eye surgery      bil eyes  . Colonsopy    . Tonsillectomy      age 23  . Skin cancer excision      top of head  . Ivc filter      pt states it has turned side ways and could not be removed.  . Vertebroplasty  12/31/2011    Procedure: VERTEBROPLASTY;  Surgeon: Winfield Cunas, MD;  Location: [facility];  Service: Neurosurgery;  Laterality: N/A;  Thoracic eleven vertebroplasty  . Ectopic pregnancy surgery    . Hemorrhoid surgery    . Cardiac catheterization      2010 Does not see a cardiac  doctor  . Dental surgery    . Kyphoplasty  09/03/2012    Procedure: KYPHOPLASTY;  Surgeon: Winfield Cunas, MD;  Location: [facility];  Service: Neurosurgery;  Laterality: N/A;  Thoracic eight Kyphoplasty    Family History  Problem Relation Age of Onset  . Anesthesia problems Neg Hx   . Heart disease Father     myocardial infarction  . Heart disease Brother     myocardial infarction  . Lymphoma Sister   . Breast cancer Neg Hx   . Colon cancer Neg Hx     Allergies  Allergen Reactions  . Cefuroxime Axetil Rash    She does fine with keflex  . Doxycycline Nausea Only    Unable to eat, weight loss.  . Advil [Ibuprofen] Swelling  . Celebrex [Celecoxib] Nausea Only  . Daypro [Oxaprozin] Other (See Comments)    Dizziness   . Etodolac Nausea Only  . Macrobid [employer] Macro]   . Penicillins   . Percocet [Oxycodone-Acetaminophen] Other (See Comments)    Nausea and stomach swelling  . Tramadol   . Valium [Diazepam] Other (See Comments)    Dizziness   . Neosporin [Neomycin-Bacitracin Zn-Polymyx] Rash  . Vicodin [Hydrocodone-Acetaminophen] Nausea Only    Current Outpatient Prescriptions on File Prior to Visit  Medication Sig Dispense Refill  . acetaminophen (TYLENOL) 650 MG CR tablet Take 650 mg by mouth every  8 (eight) hours as needed.    . bifidobacterium infantis (ALIGN) capsule Take 1 capsule by mouth daily. 30 capsule 0  . Calcium Carbonate-Vitamin D (CALCIUM 600 + D PO) Take 1 tablet by mouth 3 (three) times daily.    . furosemide (LASIX) 20 MG tablet Take 1 tablet (20 mg total) by mouth daily. 30 tablet 5  . HYDROcodone-acetaminophen (NORCO/VICODIN) 5-325 MG per tablet Take 1 tablet by mouth every 8 (eight) hours as needed.    Marland Kitchen omeprazole (PRILOSEC) 20 MG capsule Take 1 capsule by mouth  daily 90 capsule 0  . OVER THE COUNTER MEDICATION Take 1 tablet by mouth 2 (two) times daily. Osteo Bi-flex    . ramipril (ALTACE) 5 MG  capsule Take 1 capsule by mouth  daily 90 capsule 1  . sertraline (ZOLOFT) 50 MG tablet Take 1 tablet by mouth  daily 90 tablet 0  . sulfamethoxazole-trimethoprim (BACTRIM DS) 800-160 MG per tablet Take 1 tablet by mouth 2 (two) times daily. 14 tablet 0   No current facility-administered medications on file prior to visit.      Objective:   Physical Exam  Constitutional: She is oriented to person, place, and time. She appears well-developed and well-nourished.  HENT:  Head: Normocephalic.  Cardiovascular: Normal rate and regular rhythm.   Pulmonary/Chest: Effort normal and breath sounds normal.  Abdominal: Soft. She exhibits no distension and no mass. There is no tenderness. There is no rebound, no guarding and no CVA tenderness.  Neurological: She is alert and oriented to person, place, and time. Coordination normal.  Slow ambulation, steady with walker, pt slow to stand and get balance before moving.   Skin: Skin is warm and dry. No rash noted.  Psychiatric: She has a normal mood and affect. Her behavior is normal. Judgment and thought content normal.   BP 132/60 mmHg  Pulse 68  Temp(Src) 97.5 F (36.4 C) (Oral)  Resp 14  Ht 5\' 2"  (1.575 m)  Wt 120 lb 6.4 oz (54.613 kg)  BMI 22.02 kg/m2  SpO2 99%      Assessment & Plan:  This is a routine wellness  examination for this patient.  I reviewed all health maintenance protocols including mammography, colonoscopy, bone density.  Needed referrals placed. Age and diagnosis appropriate screening labs were ordered.  Her immunization history was reviewed.  Her current medications and allergies were reviewed and needed refills of her chronic medications were ordered if needed.  The plan for yearly health maintenance was discussed.    MEDICARE ATTESTATION I have personally reviewed:  The patient's medical and social history. The use of alcohol, tobacco and illicit drugs. The current medications and supplements. The patient's function ability including ADLs, fall risks, home safety risks, cognitive, and hearing and visual impairment.   Diet and physical activities. Evaluation for depression and mood disorders.    The patient's weight, height, BMI and visual acuity have been recorded in the chart.  I have made referrals, counseled and provided education to the patient based on review of the above.

## 2014-08-18 ENCOUNTER — Encounter: Payer: Self-pay | Admitting: Surgery

## 2014-08-18 LAB — URINE CULTURE: Colony Count: >=100000

## 2014-08-24 DIAGNOSIS — L97222 Non-pressure chronic ulcer of left calf with fat layer exposed: Secondary | ICD-10-CM | POA: Diagnosis not present

## 2014-08-25 DIAGNOSIS — I871 Compression of vein: Secondary | ICD-10-CM | POA: Diagnosis not present

## 2014-09-07 DIAGNOSIS — L97222 Non-pressure chronic ulcer of left calf with fat layer exposed: Secondary | ICD-10-CM | POA: Diagnosis not present

## 2014-09-18 ENCOUNTER — Encounter: Payer: Self-pay | Admitting: Surgery

## 2014-09-21 DIAGNOSIS — I10 Essential (primary) hypertension: Secondary | ICD-10-CM | POA: Diagnosis not present

## 2014-09-21 DIAGNOSIS — L97222 Non-pressure chronic ulcer of left calf with fat layer exposed: Secondary | ICD-10-CM | POA: Diagnosis not present

## 2014-09-28 DIAGNOSIS — I10 Essential (primary) hypertension: Secondary | ICD-10-CM | POA: Diagnosis not present

## 2014-09-28 DIAGNOSIS — L97222 Non-pressure chronic ulcer of left calf with fat layer exposed: Secondary | ICD-10-CM | POA: Diagnosis not present

## 2014-10-12 DIAGNOSIS — I871 Compression of vein: Secondary | ICD-10-CM | POA: Diagnosis not present

## 2014-10-12 DIAGNOSIS — I10 Essential (primary) hypertension: Secondary | ICD-10-CM | POA: Diagnosis not present

## 2014-10-12 DIAGNOSIS — L97222 Non-pressure chronic ulcer of left calf with fat layer exposed: Secondary | ICD-10-CM | POA: Diagnosis not present

## 2014-10-17 ENCOUNTER — Encounter
Admit: 2014-10-17 | Disposition: A | Discharge status: Home / Self Care | Payer: Self-pay | Attending: Surgery | Admitting: Surgery

## 2014-10-19 DIAGNOSIS — L97222 Non-pressure chronic ulcer of left calf with fat layer exposed: Secondary | ICD-10-CM | POA: Diagnosis not present

## 2014-10-19 DIAGNOSIS — L97822 Non-pressure chronic ulcer of other part of left lower leg with fat layer exposed: Secondary | ICD-10-CM | POA: Diagnosis not present

## 2014-10-19 DIAGNOSIS — S40812A Abrasion of left upper arm, initial encounter: Secondary | ICD-10-CM | POA: Diagnosis not present

## 2014-10-19 DIAGNOSIS — I10 Essential (primary) hypertension: Secondary | ICD-10-CM | POA: Diagnosis not present

## 2014-10-26 DIAGNOSIS — S40812A Abrasion of left upper arm, initial encounter: Secondary | ICD-10-CM | POA: Diagnosis not present

## 2014-10-26 DIAGNOSIS — I871 Compression of vein: Secondary | ICD-10-CM | POA: Diagnosis not present

## 2014-10-26 DIAGNOSIS — I10 Essential (primary) hypertension: Secondary | ICD-10-CM | POA: Diagnosis not present

## 2014-10-26 DIAGNOSIS — L97222 Non-pressure chronic ulcer of left calf with fat layer exposed: Secondary | ICD-10-CM | POA: Diagnosis not present

## 2014-11-02 DIAGNOSIS — I10 Essential (primary) hypertension: Secondary | ICD-10-CM | POA: Diagnosis not present

## 2014-11-02 DIAGNOSIS — L97222 Non-pressure chronic ulcer of left calf with fat layer exposed: Secondary | ICD-10-CM | POA: Diagnosis not present

## 2014-11-02 DIAGNOSIS — S40812A Abrasion of left upper arm, initial encounter: Secondary | ICD-10-CM | POA: Diagnosis not present

## 2014-11-09 DIAGNOSIS — I10 Essential (primary) hypertension: Secondary | ICD-10-CM | POA: Diagnosis not present

## 2014-11-09 DIAGNOSIS — L97222 Non-pressure chronic ulcer of left calf with fat layer exposed: Secondary | ICD-10-CM | POA: Diagnosis not present

## 2014-11-09 DIAGNOSIS — S40812A Abrasion of left upper arm, initial encounter: Secondary | ICD-10-CM | POA: Diagnosis not present

## 2014-11-16 DIAGNOSIS — L97222 Non-pressure chronic ulcer of left calf with fat layer exposed: Secondary | ICD-10-CM | POA: Diagnosis not present

## 2014-11-16 DIAGNOSIS — I10 Essential (primary) hypertension: Secondary | ICD-10-CM | POA: Diagnosis not present

## 2014-11-16 DIAGNOSIS — S40812A Abrasion of left upper arm, initial encounter: Secondary | ICD-10-CM | POA: Diagnosis not present

## 2014-11-17 ENCOUNTER — Encounter
Admit: 2014-11-17 | Disposition: A | Discharge status: Home / Self Care | Payer: Self-pay | Attending: Surgery | Admitting: Surgery

## 2014-11-23 DIAGNOSIS — L97222 Non-pressure chronic ulcer of left calf with fat layer exposed: Secondary | ICD-10-CM | POA: Diagnosis not present

## 2014-11-23 DIAGNOSIS — L97221 Non-pressure chronic ulcer of left calf limited to breakdown of skin: Secondary | ICD-10-CM | POA: Diagnosis not present

## 2014-11-23 DIAGNOSIS — S40812A Abrasion of left upper arm, initial encounter: Secondary | ICD-10-CM | POA: Diagnosis not present

## 2014-11-23 DIAGNOSIS — I10 Essential (primary) hypertension: Secondary | ICD-10-CM | POA: Diagnosis not present

## 2014-11-30 DIAGNOSIS — L97222 Non-pressure chronic ulcer of left calf with fat layer exposed: Secondary | ICD-10-CM | POA: Diagnosis not present

## 2014-11-30 DIAGNOSIS — S40812A Abrasion of left upper arm, initial encounter: Secondary | ICD-10-CM | POA: Diagnosis not present

## 2014-11-30 DIAGNOSIS — I10 Essential (primary) hypertension: Secondary | ICD-10-CM | POA: Diagnosis not present

## 2014-12-05 NOTE — Consult Note (Signed)
Brief Consult Note: Diagnosis: Lower GI bleed. Differential inclusive of infectious, inflammatory as well as ischemic.  Significant medical history of DVT s/p IVC filter placement, Hypercholesterolemiam, Hypertension.  Syncopal episode Tuesday of this week and sustained injury to right shoulder.  Mild anemia.   Consult note dictated.   Discussed with Attending MD.   Comments: Patient's presentation discussed with Dr. Verdie Shire.  Discussed with patient the recommendation of proceeding with diagnostic colonoscopy to assess for evidence of ischemia, inflammatory changes as well as blood loss source.  Patient declines wishing to proceed forward in this manner. In fact she states she wishes to be discharged home.  Do feel she warrants trending of H/H to make sure does not drop further and blood transfusion is warranted.  As well as to monitor for reoccurrence of bleeding.  Advance diet as tolerated.  Continuing to experience left lower quadrant pain, improved.  Mild at this time.  Will continue to monitor laboratory studies as well as hemodynamic status.  Electronic Signatures: Payton Emerald (NP)  (Signed 19-Dec-13 12:02)  Authored: Brief Consult Note   Last Updated: 19-Dec-13 12:02 by Payton Emerald (NP)

## 2014-12-05 NOTE — Consult Note (Signed)
PATIENT NAME:  Kayla Galloway, Kayla Galloway MR#:  [id number] DATE OF BIRTH:  1921/06/30  DATE OF CONSULTATION:  08/05/2012  CONSULTING PHYSICIAN:  Jerrol Banana. Burt Knack, MD  CHIEF COMPLAINT: Abdominal pain.   HISTORY OF PRESENT ILLNESS: This is a patient who presents with abdominal pain mostly on the left side, which has been going on for several days.  She had some lower GI bleeding as well, has never had an episode like this before. She denies nausea or vomiting and denies fevers or chills.   She has a considerable past medical history, including kidney stones, renal vein thrombosis. She has an IVC filter and atrial fibrillation. She also had a retroperitoneal bleed. A CT scan performed demonstrated a segment of colitis in the splenic flexure. The CT will be personally reviewed.   PAST MEDICAL HISTORY: Extensive medical history, including inferior vena cava filter, atrial fibrillation, hyperlipidemia, gout, kidney stones, hypertension, and osteoporosis.   PAST SURGICAL HISTORY: Tubal pregnancy, hemorrhoidectomy, spine surgery, tonsillectomy, and left hip fracture repair.   ALLERGIES: Multiple, Advil, Avapro, Daypro, _____, Levaquin, oxycodone, tramadol, and Valium.   MEDICATIONS: Multiple, see chart.   FAMILY HISTORY: Noncontributory.    SOCIAL HISTORY: The patient does not smoke or drink.   Review of systems: A 10-system review is performed and negative with the exception of that mentioned in the history of present illness.   PHYSICAL EXAMINATION:  GENERAL: Healthy-appearing, elderly patient, BMI of 21, weight of 114 pounds.  VITAL SIGNS: Temperature 97.7, pulse of 93, respirations 18, blood pressure 130/64.  HEENT: No scleral icterus.  NECK: No palpable neck nodes.  CHEST: Clear to auscultation.  CARDIAC: Regular rate and rhythm.  ABDOMEN: Soft, minimally tender in the left side. No peritoneal signs. No guarding. No rebound. No percussion tenderness.  EXTREMITIES: Without edema.  NEUROLOGIC:  Grossly intact.  INTEGUMENT: No jaundice.   CT scan will be personally reviewed. There is a segment of colitis in the splenic flexure.   Creatinine of 1.28, chloride of 96, CO2 of 25.  White blood cell count of 9.6, hemoglobin and hematocrit of 11 and 32, with a platelet count of 154.   ASSESSMENT AND PLAN: This is a patient with colitis.  Etiology of this is unclear.  Endoscopy is certainly indicated and Dr. Candace Cruise has been consulted and will see the patient. I will be happy to follow the patient with the admitting team; but at this point, there are no surgical needs unless something identifiable is found on colonoscopy or on repeat exam.     ____________________________ Jerrol Banana. Burt Knack, MD rec:th D: 08/05/2012 17:41:29 ET T: 08/05/2012 22:10:45 ET JOB#: [id number]  cc: Jerrol Banana. Burt Knack, MD, <Dictator> Florene Glen MD ELECTRONICALLY SIGNED 08/06/2012 18:42

## 2014-12-05 NOTE — Consult Note (Signed)
Pt seen and examined. PLease see Kayla Galloway's notes. No more bleeding. Some LLQ abd tenderness. Likely ischemic colitis but infectious/inflammatory causes possible. Wants to go home. Does not want colonoscopy. Advance diet as tolerated. If no signif bleeding or abd pain, ok for discharge with f/u in GI office later. Will follow. Thanks.  Electronic Signatures: Verdie Shire (MD)  (Signed on 19-Dec-13 12:46)  Authored  Last Updated: 19-Dec-13 12:46 by Verdie Shire (MD)

## 2014-12-05 NOTE — H&P (Signed)
PATIENT NAME:  Kayla Galloway, Kayla Galloway MR#:  [id number] DATE OF BIRTH:  17-Jan-1921  DATE OF ADMISSION:  08/05/2012  REFERRING PHYSICIAN: Lisa Roca, MD  PRIMARY CARE PHYSICIAN: [name], MD  CHIEF COMPLAINT: Abdominal pain, bright red blood per rectum.   HISTORY OF PRESENT ILLNESS: This is an 81 year old female with significant past medical history of hyperlipidemia, hypertension, history of DVT status post IVC filter, gout, nephrolithiasis, history of renal vein thrombosis, history of original IVC filter causing retroperitoneal bleed, and history of A-fib in the past who presents with complaints of abdominal pain and bright red blood per rectum. The patient reports she had an episode of streaks of blood with her stools yesterday, as well she had another episode where she had blood clots with her stools, as well she had one episode in the ED where she had stools with blood clots. The patient was examined by the ED physician who did not find any hemorrhoids. The patient's hemoglobin was 11. The patient currently denies any lightheadedness or dizziness, but reports she had syncope before 2 days where she was going to the bathroom where she felt lightheaded and fell on the floor where she had the phone where she called her sister who lives next to her and she came where she reports she found her sister on the floor, but then she regained consciousness and reports she had significant bowel movement, which she had on the floor, but there was no blood with when she cleaned her and then took her back to bed. The patient had CT of the abdomen and pelvis done without contrast which did show evidence of bowel wall thickening in the splenic flexure, descending and sigmoid area. The patient denies any fever or chills, does not have any leukocytosis and her lactic acid was 0.9 (within normal limits). The patient denies any nausea, vomiting, diarrhea or postprandial pain. As well, the patient was found to have a low  sodium level of 128, which was normal in the past. The patient had incidental finding on her CT of the abdomen which did show malposition of IVC filter. The patient is known to have history of DVT in the past where she had IVC filter placed where it was malpositioned causing retroperitoneal bleed, in 2010. Hospitalist service was requested to admit the patient for further management and workup of her symptoms.   PAST MEDICAL HISTORY: 1. Hyperlipidemia.  2. Gout. 3. Nephrolithiasis. 4. Hypertension.  5. History of left comminuted intertrochanteric hip fracture status post surgery in 2009.  6. History of DVT in both legs, previously maintained on warfarin. IVC filter placement in 2010.  7. 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  8. History of renal vein thrombosis with previous history of renal insufficiency.   PAST SURGICAL HISTORY: 1. Tubal pregnancy in 1948 2. Hemorrhoidectomy in 1952.  3. Status post tonsillectomy at age 45.  92. Kyphoplasty in the lower back x 2.  5. Status post left hip fracture repair with subsequent distal femoral hardware removal in 2009.  6. Cataract surgery on the right in 2012.   HOME MEDICATIONS: 1. Calcium 600 with vitamin D 1 tablet 3 times a day.  2. Ferrous sulfate 60 mg 5 tablets daily.  3. Lasix 20 mg daily.  4. Metoprolol 25 mg half tablet at bedtime.  5. Omeprazole 20 mg daily.  6. Enalapril 10 mg oral daily.  7. Reclast IV for osteoporosis. 8. Vitamin D 1 tablet daily.   ALLERGIES:  ADVIL CAUSED SWELLING, CELEBREX CAUSED NAUSEA, DIPRO CAUSED DIZZINESS, ETODOLAC CAUSED NAUSEA, OXYCODONE CAUSES SWELLING AND NAUSEA, PERCOCET CAUSED NAUSEA AND STOMACHACHE AND VALIUM CAUSED DIZZINESS.   FAMILY HISTORY: Significant for cardiac disease.   SOCIAL HISTORY: The patient is widowed, lives at home. No history of tobacco or alcohol use.   REVIEW OF SYSTEMS:   CONSTITUTIONAL: The patient denies any fever or  chills. Complains of fatigue and weakness.   EYES: Denies blurry vision, double vision or pain.   ENT: Denies tinnitus, ear pain, hearing loss, epistaxis or discharge.   RESPIRATORY: Denies cough, wheezing, hemoptysis, dyspnea, or COPD.  CARDIOVASCULAR: Denies chest pain, edema, arrhythmia or palpitation. Had syncope before, two days.  GASTROINTESTINAL: Denies any nausea, vomiting or diarrhea. Complains of abdominal pain with bright red blood per rectum and blood clots. Denies any hematemesis, coffee-ground emesis or jaundice.   GENITOURINARY: Denies dysuria, hematuria or renal colic.  ENDOCRINE: Denies polyuria, polydipsia, heat or cold intolerance.   HEMATOLOGY: Denies easy bruising. Has previous history of retroperitoneal bleed. Has history of bilateral extremity DVT.   INTEGUMENTARY: Denies acne, rash or skin lesions.   MUSCULOSKELETAL: Denies any swelling, cramps, knee pain or shoulder pain.   NEUROLOGIC: Denies dysarthria, epilepsy, tremors, vertigo, ataxia, headache, migraine, CVA or TIA in the past.   PSYCHIATRIC: Denies anxiety, schizophrenia, insomnia, nervousness or bipolar disorder.   PHYSICAL EXAMINATION:  VITAL SIGNS: Temperature is 98.4, pulse 29, respiratory rate 20, blood pressure 173/74 and saturating 99% on room air.   GENERAL: This is a frail, elderly female who looks comfortable in bed, in no apparent distress.   HEENT: Head atraumatic, normocephalic. Pupils equal and reactive to light. Pink conjunctivae. Anicteric sclerae. Moist oral mucosa.   NECK: Supple. No thyromegaly. No JVD.   CHEST: Good air entry bilaterally. No wheezing, rales or rhonchi.   CARDIOVASCULAR: S1 and S2 heard. No rubs, murmur or gallops.   ABDOMEN: Diffuse tenderness but no rebound, no guarding and no organomegaly. Has hyperperistaltic bowel sounds.   EXTREMITIES: No edema. No clubbing. No cyanosis.   PSYCHIATRIC: Appropriate affect, awake and alert x3. Intact judgment and  insight.   NEUROLOGIC: Cranial nerves grossly intact. Motor 5 out of 5.   SKIN: Normal skin turgor. Warm and dry.   PERTINENT LABS: Glucose 90, BUN 16, creatinine 1.28, sodium 128, potassium 4.3, chloride 96, CO2 25 and GFR 37. White blood cells 9.6, hemoglobin 11, hematocrit 31.9 and platelets 154. INR 0.9. Lactic acid 0.9.   RADIOLOGIC DATA: CT of abdomen and pelvis without contrast is showing abnormal splenic flexure, descending and sigmoid colon with wall thickening. Malposition of IVC filter. Chronic lung disease. Cardiomyopathy.   ASSESSMENT AND PLAN: An 81 year old female who presents with lower GI bleed, has bright red blood per rectum, hemoglobin of 11 and CT of abdomen showing colitis suspicious for infectious versus ischemic colitis.  1. Bright red blood per rectum. At this point of unclear etiology, but this is most likely due to her colitis, but at this point the source cannot be identified for sure and other sources need to be ruled out. If the patient continues to bleed and becomes unstable or hemoglobin drops significantly, we will have bleeding scan done. We will continue to monitor her hemoglobin and hematocrit, we will check stool work-up, we will continue her on IV Zosyn and we will have her on Protonix. We will consult surgery and GI. 2. Colitis, ischemic versus colitis. The distribution is suspicious for ischemic colitis, but the patient has normal  lactic acid. There is a questionable history of A-fib in the past, but she is currently in normal sinus rhythm. We will continue with fluids, antibiotics and keep the patient well hydrated. We will keep her n.p.o. and we will check stool work-up. Discussed with Dr. Tamala Julian from surgical service.  3. Hyponatremia due to volume depletion. Continue normal saline. 4. Syncope. This is most likely due to her GI bleed. We will check the patient's orthostatics. We will hold her hypertension medication. We will have her on  telemonitor. 5. Malposition of IVC filter. This is known in the past. We will consult vascular surgery. Unlikely this is causing the patient's current symptoms of bright red blood per rectum.  6. Hyperlipidemia. We will resume statin when the patient is more stable.  7. Hypertension. We will hold medications at this point due to her GI bleed and syncope. We will have her on p.r.n. hydralazine as needed.  8. History of DVT in the past. The patient is status post IVC filter. Given history of her retroperitoneal bleed and current lower GI bleed, we will hold on chemical anticoagulation.  9. DVT prophylaxis. The patient has an IVC filter, and given her history of bilateral DVT, we will not start any sequential compression device, as the patient will be _____ in the hospital and, if remains nonambulatory, we will check bilateral venous Doppler to rule out any DVT before resuming her sequential compression device.   CODE STATUS: FULL CODE.   TOTAL TIME SPENT ON ADMISSION AND PATIENT CARE: 60 minutes.  ____________________________ Albertine Patricia, MD dse:sb D: 08/05/2012 03:20:11 ET T: 08/05/2012 07:57:53 ET JOB#: [id number]  cc: Albertine Patricia, MD, <Dictator> Renton Berkley Graciela Husbands MD ELECTRONICALLY SIGNED 08/11/2012 12:21

## 2014-12-05 NOTE — Consult Note (Signed)
Brief Consult Note: Diagnosis: spl flxr colitis.   Patient was seen by consultant.   Consult note dictated.   Recommend to proceed with surgery or procedure.   Comments: will likely need endoscopy to assess etiology. Will be avail as needed but currently pain free and very minimally tender.  Electronic Signatures: Florene Glen (MD)  (Signed 19-Dec-13 11:38)  Authored: Brief Consult Note   Last Updated: 19-Dec-13 11:38 by Florene Glen (MD)

## 2014-12-05 NOTE — Consult Note (Signed)
Chief Complaint:   Subjective/Chief Complaint No further bleeding. Min abd cramping.   VITAL SIGNS/ANCILLARY NOTES: **Vital Signs.:   20-Dec-13 07:29   Vital Signs Type Routine   Temperature Temperature (F) 97.6   Celsius 36.4   Temperature Source Oral   Pulse Pulse 70   Respirations Respirations 18   Systolic BP Systolic BP 759   Diastolic BP (mmHg) Diastolic BP (mmHg) 58   Mean BP 83   Pulse Ox % Pulse Ox % 94   Pulse Ox Activity Level  At rest   Oxygen Delivery Room Air/ 21 %   Brief Assessment:   Cardiac Regular    Respiratory clear BS    Gastrointestinal mild LLQ abd tenderness   Assessment/Plan:  Assessment/Plan:   Assessment Colitis, prob ischemic. Feeling better.    Plan Advance diet to low residue if tolerated full liquid diet. If tolerated, ok for discharge. Pt anxious to go home. Since patient refuses colonoscopy, patient can f/u in GI office later. If sxs worsen again, then call GI on call. Thanks.   Electronic Signatures: Verdie Shire (MD)  (Signed 20-Dec-13 09:04)  Authored: Chief Complaint, VITAL SIGNS/ANCILLARY NOTES, Brief Assessment, Assessment/Plan   Last Updated: 20-Dec-13 09:04 by Verdie Shire (MD)

## 2014-12-05 NOTE — Consult Note (Signed)
Patient admitted with splenic flexure colitis.  On Abx.  are asked to see due to an IVC filter with position on CT with struts clearly outside of the IVC.  This is a known entity and was seen on CT 3-4 years ago.  Over 3 years ago we discussed options with the patient, and she desired no surgical therapy for removal which was a wise choice as this is a highly morbid surgery and she was 87 at the time.  She is now 45 and has had no further issues from her filter.  The filter looks the same on the CT now as the CT in May of 2010.  She has no major leg swelling or related problems.  This is not the cause of her current problem.  No intervention planned for her IVC filter.  Will sign off, please call with questions.    Electronic Signatures: Algernon Huxley (MD)  (Signed on 19-Dec-13 10:12)  Authored  Last Updated: 19-Dec-13 10:12 by Algernon Huxley (MD)

## 2014-12-05 NOTE — Consult Note (Signed)
ADDENDUM  PATIENT NAME:  Kayla Galloway, Kayla Galloway MR#:  [id number] DATE OF BIRTH:  [birth year]  DATE OF CONSULTATION:  08/05/2012  MEDICATIONS: Home medications should be: 1. Calcium 600 plus D 200 International Units 1 tablet 3 times a day.  2. Furosemide 20 mg a day. 3. Omeprazole 20 mg once a day.  4. Osteo Bi-Flex 2 tablets once a day.  5. Ramipril 10 mg once daily.  6. Zoloft 50 mg once a day.   ALLERGIES: ADVIL, AVAPRO, DAYPRO, LODINE, LEVAQUIN, OXYCODONE, TRAMADOL AND VALIUM.    ____________________________ Payton Emerald, NP dsh:es D: 08/05/2012 12:21:00 ET T: 08/05/2012 14:11:52 ET JOB#: [id number]  cc: Payton Emerald, NP, <Dictator> Payton Emerald MD ELECTRONICALLY SIGNED 08/09/2012 16:42

## 2014-12-07 DIAGNOSIS — S40812A Abrasion of left upper arm, initial encounter: Secondary | ICD-10-CM | POA: Diagnosis not present

## 2014-12-07 DIAGNOSIS — I10 Essential (primary) hypertension: Secondary | ICD-10-CM | POA: Diagnosis not present

## 2014-12-07 DIAGNOSIS — L97821 Non-pressure chronic ulcer of other part of left lower leg limited to breakdown of skin: Secondary | ICD-10-CM | POA: Diagnosis not present

## 2014-12-07 DIAGNOSIS — L97222 Non-pressure chronic ulcer of left calf with fat layer exposed: Secondary | ICD-10-CM | POA: Diagnosis not present

## 2014-12-08 NOTE — Consult Note (Signed)
PATIENT NAME:  Kayla Galloway, Kayla Galloway MR#:  [id number] DATE OF BIRTH:  30-Jan-1921  DATE OF CONSULTATION:  08/05/2012  REFERRING PHYSICIAN:  Phillips Climes, MD CONSULTING PHYSICIAN:  Lupita Dawn. Candace Cruise, MD / Ebony Cargo, PA  REASON FOR CONSULTATION: Lower gastrointestinal bleed.  HISTORY OF PRESENT ILLNESS: Ms. Tirone is an 81 year old Caucasian female who presented to [facility] Emergency Room for the concern of acute onset of rectal bleeding yesterday morning. She has significant past medical history of hyperlipidemia, hypertension, history of DVT status post IVC filter, followed by Dr. Leotis Pain, gout, nephrolithiasis, history of renal vein thrombus, history of IVC filter causing retroperitoneal bleeding and atrial fibrillation. Due to her known history of retroperitoneal bleeding, the patient is not a candidate for anticoagulant therapy, according to her. In review of our office records, most recent colonoscopy which the patient has had as well as an upper endoscopy was performed by Dr. Rhona Leavens on 05/28/2006. Colonoscopy was unremarkable. Upper endoscopy revealed esophagus to be normal and a deformity was noted in the prepyloric region of the stomach which could be consistent with old ulcer disease. Duodenum was normal. The patient states that she was doing well, normal health, up until yesterday morning. She ate around 7:00 and then following that she had an episode of bright red blood per rectum. There was no passage of feces at this time. She denies severe abdominal pain when the first occurrence of bleeding occurred, but subsequently started with abdominal discomfort/pain to left lower quadrant. She is unable to elaborate, but knows she had several episodes of passage of bright red blood. The second occurrence was definitely passage of feces at that time. With passage of flatulence she would pass blood. She states she has not passed any blood since 4:00 yesterday afternoon and this was confirmed by nursing staff  that her bowel movement this morning was only brown-colored feces and no evidence of bright red blood. She continues with mild discomfort to left lower quadrant of her abdomen. No nausea, no vomiting, afebrile. She has had no prior occurrences of rectal bleeding similar to this in the past. Her appetite has been good. She states that she does not sleep well at night and denies any recent weight loss. Normal bowel pattern is once a day. Yesterday there was passage of blood clots, but again no evidence this morning.   Does note that on Tuesday she did sustain a syncopal episode. States that she does have these episodes and that her primary doctor, Dr. [name], is aware of this. She did fall and injure her right shoulder and does complain of pain to this area.    PAST MEDICAL HISTORY:  1. Hyperlipidemia. 2. Gout. 3. Nephrolithiasis. 4. Hypertension. 5. Left comminuted intertrochanteric hip fracture status post surgery 2009.  6. History of DVT both legs, previously maintained on warfarin, IVC filter placed in 2010, and sustained a peritoneal bleed thus unable to be on anticoagulant therapy, according to patient. 7. History of renal vein thrombus with previous history of renal insufficiency.  8. Atrial fibrillation. 9. Osteoporosis.  PAST SURGICAL HISTORY: 1. Tubal pregnancy in 1948.  2. Hemorrhoidectomy in 1952.  3. Lumbar spinal surgery in May 2013. 4. Status post tonsillectomy at age of 42. 60. Kyphoplasty by Dr. Mauri Pole 04/2005 and then kyphoplasty repeated 06/2005 for compression fraction, L4-L5. 6. Cardiac catheterization in 2006, unremarkable.  7. Cardiac catheterization in 2012. 8. Status post left hip fracture repair with subsequent distal femoral hardware removal in 2009.   FAMILY HISTORY:  Sister with history of lymphoma. Mother, brother and sister with history of diabetes mellitus. No family history of colon cancer or colonic polyps.   SOCIAL HISTORY: No tobacco and no alcohol  use.   REVIEW OF SYSTEMS:   CONSTITUTIONAL: Denies any fevers, excessive fatigue or weight loss. Did sustain fall Tuesday, 08/03/2012.   HEENT: Denies any chronic eye redness, iritis, oral ulcers or hoarseness.   CARDIOVASCULAR: Syncopal episode as noted. Denies any chest pain. No shortness of breath.   PULMONARY: No dyspnea, cough, hemoptysis or wheezing.   GASTROINTESTINAL: See history of present illness.   GENITOURINARY: Denies any dysuria or hematuria.   MUSCULOSKELETAL: Denies arthralgias or myalgias, although significant for chronic lumbar spinal pain. Known history of osteoporosis.   SKIN: No rashes, lesions or jaundice.   PSYCH: Denies depression or anxiety.   HEME/LYMPH: Denies lymphadenopathy. Significant for easy bruising and bleeding.   EXTREMITIES: No edema left lower extremity. Significant for chronic edema noted especially at nighttime to right lower extremity ankle area.   PHYSICAL EXAMINATION:  VITAL SIGNS: Temperature is 97.6, pulse is 91, respirations are 18 and blood pressure is 141/70 with a pulse oximetry of 96%.  GENERAL: Well-developed, slender 81 year old Caucasian female, in no acute distress noted. Pleasant. Resting comfortably in bed.   HEENT: Normocephalic, atraumatic. Pupils equal and reactive to light. Conjunctiva clear. Sclerae anicteric.   NECK: Supple. Trachea midline. No lymphadenopathy or thyromegaly.   PULMONARY: Symmetric rise and fall of chest. Clear to auscultation throughout.   CARDIOVASCULAR: Irregularly, irregular S1 and S2.   ABDOMEN: Soft, nondistended. Mild discomfort noted to left lower quadrant. No rebound tenderness, bruits or masses. Evidence of hepatosplenomegaly.   RECTAL: Deferred.   MUSCULOSKELETAL: Moving all four extremities. No contractures. No clubbing.   EXTREMITIES: Mild edema, nonpitting, right ankle.   PSYCH: Alert and oriented x4. Memory grossly intact. Appropriate affect and mood.   NEUROLOGIC: No gross  neurological deficits.   LABORATORY/DIAGNOSTICS: On 08/04/2012, at 1650, sodium was 128, chloride is 96 and EGFR is 37, otherwise within normal limits. Calcium is 9.0. Hepatic panel is within normal limits. CBC: RBC is 3.25 with a hemoglobin of 11.0 with hematocrit of 31.9. Antibody screen is negative. ABO group plus Rh type is A-negative. PT/INR is 12.5/0.9. Urinalysis is essentially unremarkable. Lactic acid, cardiopulmonary, is 0.9.  RADIOLOGIC DATA: CT scan of the abdomen and pelvis done without contrast: Linear areas of increased density project within the right and left bases. Noncontrast evaluation of the liver and the spleen, adrenals and kidneys are unremarkable. Evaluation of the bowel demonstrates bowel wall thickening in the region of the splenic flexure. Sigmoid colon has the appearance of diffuse fecal retention within the colon. No evidence of surrounding free fluid, inflammatory changes of loculated fluid collections and no evidence of free air. Findings are felt to be consistent with colitis involving splenic flexure region of the colon. Differential considerations are infectious versus inflammatory. Ischemic cannot be completely excluded, although this is the lower differential consideration. Malposition of IVC filter, chronic lung disease within the lung bases. Cardiomegaly.   IMPRESSION:  1. Lower gastrointestinal bleed. Differential inclusive of inflammatory/infectious versus ischemic in nature.  2. Known history of hypercholesteremia, hypertension, deep vein thrombosis requiring IVC filter placement.  3. Syncopal episode Tuesday of this week, sustained injury to right shoulder.  4. Mild anemia.   PLAN: The patient's presentation was discussed with Dr. Verdie Shire. Recommendation is to proceed forward with diagnostic colonoscopy to allow direct luminal evaluation of the lower GI  tract to evaluate for evidence of ischemia as well as malignancy and blood loss source. The patient declines  proceeding forward with endoscopic evaluation at this time. In fact, she states she is ready to go home. Continues with left lower quadrant abdominal pain and do feel that the patient warrants additional monitoring to make sure that bleeding does not recur. We will continue to follow the patient during her hospitalization inclusive of hemodynamic status as well as laboratory results. The patient should follow up with [place] on an outpatient basis to rediscuss and evaluate the consideration of endoscopic evaluation.   These services provided by Payton Emerald, MS, APRN, [facility], FNP under collaborative agreement with Verdie Shire, MD.  ____________________________ Lupita Dawn. Candace Cruise, MD pyo:sb D: 08/05/2012 12:01:58 ET T: 08/05/2012 13:37:36 ET JOB#: [id number]  cc: Lupita Dawn. Candace Cruise, MD, <Dictator> Lupita Dawn Marquerite Forsman MD ELECTRONICALLY SIGNED 08/05/2012 16:59

## 2014-12-08 NOTE — Discharge Summary (Signed)
PATIENT NAME:  Kayla Galloway, Kayla Galloway MR#:  [id number] DATE OF BIRTH:  01-19-1921  DATE OF ADMISSION:  08/05/2012 DATE OF DISCHARGE:  08/06/2012  ADMITTING DIAGNOSES: Lower gastrointestinal bleed.   DISCHARGE DIAGNOSES: 1.  Lower gastrointestinal bleed, likely due to ischemic colitis.  2.  Ischemic colitis.  3.  Dehydration.  4.  Hyponatremia.    5.  Anemia, acute posthemorrhagic.  6.  Orthostatic hypotension, resolved with IV fluids.  7.  History of hypertension.  8.  Hyperlipidemia.  9.  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  10.  History of atrial fibrillation.  11.  Gout.  12.  Nephrolithiasis.   DISCHARGE CONDITION: Stable.   DISCHARGE MEDICATIONS: The patient is to resume her omeprazole 20 mg p.o. daily, ramipril 10 mg p.o. daily. Osteo Bi-Flex 2 tablets once daily. Calcium with vitamin D 1 tablet 3 times daily. Sertraline 50 p.o. daily. Amoxicillin clavulanate 500/125 mg 1 tablet twice a day for 10 days. The patient is not to take lisinopril until recommended by primary care physician.   HOME OXYGEN: None.   DIET: Two grams salt, low fat, low cholesterol, mechanical soft.   ACTIVITY LIMITATIONS: As tolerated.   FOLLOWUP: Follow up appointment with Dr. [name] in 2 days after discharge.  CONSULTANTS:  Dr. Burt Knack and Dr. Candace Cruise, care management.   RADIOLOGIC STUDIES: Chest, PA and lateral, on 08/05/2012 revealed findings likely representing an underlying component of pulmonary fibrosis, no focal regions of consolidation or focal infiltrates are appreciated. CT scan of abdomen and pelvis without contrast 08/05/2012 revealed findings consistent with colitis involving the splenic flexure region of the colon. Differential considerations are infectious versus inflammatory, ischemic cannot be excluded though is of lower differential consideration. The appearance of sigmoid colon likely reflects stool retention within the colon. A component of  bowel wall thickening and colitis, early or mild, cannot be excluded, again infectious or inflammatory considering the patient's laboratory data.  Continued surveillance evaluation is recommended, malpositioned IVC filter, chronic lung disease within the lung bases, cardiomegaly.   HOSPITAL COURSE:  The patient is an 81 year old Caucasian female with past medical history significant for history of A. Fib, DVT, history of IVC filter placement in the past, history of malpositioning of IVC filter, presented to the hospital with complaints of abdominal pain and bright red blood per rectum. Please refer to go to Dr. Graciela Husbands admission note on 08/05/2012. On arrival to the hospital, the patient's vital signs: Temperature was 98.4, blood pressure 173/74, oxygen saturation was 99% on room, respiration rate was 20, pulse was 79. Physical exam was remarkable for mild tenderness in the abdomen, diffuse tenderness was noted, no guarding or organomegaly. The patient's lab data done on the day of admission, 08/05/2012, revealed low sodium of 128, otherwise BMP was unremarkable. The patient's liver enzymes were normal. The patient's white blood cell count was normal at 9.6, hemoglobin was11.0, platelet count 154. The patient's pro-time was 12.5, INR was 0.9. Urinalysis was unremarkable. Lactic acid was normal at 0.9.   The patient was admitted to the hospital. She was started on antibiotic therapy with Zosyn. Stool cultures were obtained. Stool cultures showed no pathogenic bacterial organisms The patient was evaluated by Dr. Candace Cruise as well as Dr. Burt Knack. Dr. Candace Cruise felt that the patient has likely ischemic colitis related to GI bleed. He discussed with the patient her options, however, the patient decided against colonoscopy. Dr. Burt Knack saw the patient in consultation and followed her along. He  felt that there was no need for surgical intervention and medical therapy only was recommended. The patient did progressively better as  time went on. She was able to advance her diet with no nausea, vomiting or recurrence of her bleed. She was rehydrated. It was felt that the patient's hyponatremia reflected dehydration. The patient also had orthostatic vital signs checked which were markedly abnormal. The patient's sodium level improved. On 08/06/2012, the patient's sodium is 131.  It is recommended to follow the patient's sodium levels outpatient, make sure that she is fully hydrated. It is also recommended to hold diuretic therapy unless it is recommended to be restarted by her primary care physician. It was felt that the patient's rectal bleed could have been related to ischemic colitis. The patient is to continue antibiotic therapy for 10 days. In regards to anemia, the patient was anemic already on arrival to the Emergency Room. The patient's hemoglobin level was 11.0 on 08/04/2012, it dropped down to 9.8 on day of discharge, 08/06/2012.  The patient did not require any transfusions, however, it is recommended to follow the patient's hemoglobin level as outpatient and make decisions about therapy if needed.   In regards orthostatic hypotension, the patient was rehydrated and Lasix was stopped.   For hypertension as well as hyperlipidemia, the patient is to continue her outpatient medications.   DISPOSITION: The patient is being discharged in stable condition with the above-mentioned medications and followup.    HER VITAL SIGNS ON THE DAY OF DISCHARGE: Temperature is 97.9, pulse was 70, respiration rate was 15 to 18, blood pressure 135 to 213/08'M to 57Q diastolic. Oxygen saturation was 99% on room air at rest.    TIME SPENT: 40 minutes.     ____________________________ Theodoro Grist, MD rv:cs D: 08/06/2012 18:42:55 ET T: 08/08/2012 15:23:44 ET JOB#: [id number]  cc: Theodoro Grist, MD, <Dictator> [name], MD [place] MD ELECTRONICALLY SIGNED 08/29/2012 13:46

## 2014-12-08 NOTE — Consult Note (Signed)
PATIENT NAME:  Kayla Galloway, Kayla Galloway MR#:  [id number] DATE OF BIRTH:  Aug 31, 1920  DATE OF CONSULTATION:  08/05/2012  REFERRING PHYSICIAN:  Phillips Climes, MD CONSULTING PHYSICIAN:  Payton Emerald, NP / Ebony Cargo, PA  REASON FOR CONSULTATION: Lower gastrointestinal bleed.  HISTORY OF PRESENT ILLNESS: Ms. Edgar is an 81 year old Caucasian female who presented to [facility] Emergency Room for the concern of acute onset of rectal bleeding yesterday morning. She has significant past medical history of hyperlipidemia, hypertension, history of DVT status post IVC filter, followed by Dr. Leotis Pain, gout, nephrolithiasis, history of renal vein thrombus, history of IVC filter causing retroperitoneal bleeding and atrial fibrillation. Due to her known history of retroperitoneal bleeding, the patient is not a candidate for anticoagulant therapy, according to her. In review of our office records, most recent colonoscopy which the patient has had as well as an upper endoscopy was performed by Dr. Rhona Leavens on 05/28/2006. Colonoscopy was unremarkable. Upper endoscopy revealed esophagus to be normal and a deformity was noted in the prepyloric region of the stomach which could be consistent with old ulcer disease. Duodenum was normal. The patient states that she was doing well, normal health, up until yesterday morning. She ate around 7:00 and then following that she had an episode of bright red blood per rectum. There was no passage of feces at this time. She denies severe abdominal pain when the first occurrence of bleeding occurred, but subsequently started with abdominal discomfort/pain to left lower quadrant. She is unable to elaborate, but knows she had several episodes of passage of bright red blood. The second occurrence was definitely passage of feces at that time. With passage of flatulence she would pass blood. She states she has not passed any blood since 4:00 yesterday afternoon and this was confirmed by nursing  staff that her bowel movement this morning was only brown-colored feces and no evidence of bright red blood. She continues with mild discomfort to left lower quadrant of her abdomen. No nausea, no vomiting, afebrile. She has had no prior occurrences of rectal bleeding similar to this in the past. Her appetite has been good. She states that she does not sleep well at night and denies any recent weight loss. Normal bowel pattern is once a day. Yesterday there was passage of blood clots, but again no evidence this morning.   Does note that on Tuesday she did sustain a syncopal episode. States that she does have these episodes and that her primary doctor, Dr. [name], is aware of this. She did fall and injure her right shoulder and does complain of pain to this area.    PAST MEDICAL HISTORY:  1. Hyperlipidemia. 2. Gout. 3. Nephrolithiasis. 4. Hypertension. 5. Left comminuted intertrochanteric hip fracture status post surgery 2009.  6. History of DVT both legs, previously maintained on warfarin, IVC filter placed in 2010, and sustained a peritoneal bleed thus unable to be on anticoagulant therapy, according to patient. 7. History of renal vein thrombus with previous history of renal insufficiency.  8. Atrial fibrillation. 9. Osteoporosis.  PAST SURGICAL HISTORY: 1. Tubal pregnancy in 1948.  2. Hemorrhoidectomy in 1952.  3. Lumbar spinal surgery in May 2013. 4. Status post tonsillectomy at age of 89. 75. Kyphoplasty by Dr. Mauri Pole 04/2005 and then kyphoplasty repeated 06/2005 for compression fraction, L4-L5. 6. Cardiac catheterization in 2006, unremarkable.  7. Cardiac catheterization in 2012. 8. Status post left hip fracture repair with subsequent distal femoral hardware removal in 2009.   FAMILY HISTORY:  Sister with history of lymphoma. Mother, brother and sister with history of diabetes mellitus. No family history of colon cancer or colonic polyps.   SOCIAL HISTORY: No tobacco and no  alcohol use.   REVIEW OF SYSTEMS:   CONSTITUTIONAL: Denies any fevers, excessive fatigue or weight loss. Did sustain fall Tuesday, 08/03/2012.   HEENT: Denies any chronic eye redness, iritis, oral ulcers or hoarseness.   CARDIOVASCULAR: Syncopal episode as noted. Denies any chest pain. No shortness of breath.   PULMONARY: No dyspnea, cough, hemoptysis or wheezing.   GASTROINTESTINAL: See history of present illness.   GENITOURINARY: Denies any dysuria or hematuria.   MUSCULOSKELETAL: Denies arthralgias or myalgias, although significant for chronic lumbar spinal pain. Known history of osteoporosis.   SKIN: No rashes, lesions or jaundice.   PSYCH: Denies depression or anxiety.   HEME/LYMPH: Denies lymphadenopathy. Significant for easy bruising and bleeding.   EXTREMITIES: No edema left lower extremity. Significant for chronic edema noted especially at nighttime to right lower extremity ankle area.   PHYSICAL EXAMINATION:  VITAL SIGNS: Temperature is 97.6, pulse is 91, respirations are 18 and blood pressure is 141/70 with a pulse oximetry of 96%.  GENERAL: Well-developed, slender 81 year old Caucasian female, in no acute distress noted. Pleasant. Resting comfortably in bed.   HEENT: Normocephalic, atraumatic. Pupils equal and reactive to light. Conjunctiva clear. Sclerae anicteric.   NECK: Supple. Trachea midline. No lymphadenopathy or thyromegaly.   PULMONARY: Symmetric rise and fall of chest. Clear to auscultation throughout.   CARDIOVASCULAR: Irregularly, irregular S1 and S2.   ABDOMEN: Soft, nondistended. Mild discomfort noted to left lower quadrant. No rebound tenderness, bruits or masses. Evidence of hepatosplenomegaly.   RECTAL: Deferred.   MUSCULOSKELETAL: Moving all four extremities. No contractures. No clubbing.   EXTREMITIES: Mild edema, nonpitting, right ankle.   PSYCH: Alert and oriented x4. Memory grossly intact. Appropriate affect and mood.   NEUROLOGIC:  No gross neurological deficits.   LABORATORY/DIAGNOSTICS: On 08/04/2012, at 1650, sodium was 128, chloride is 96 and EGFR is 37, otherwise within normal limits. Calcium is 9.0. Hepatic panel is within normal limits. CBC: RBC is 3.25 with a hemoglobin of 11.0 with hematocrit of 31.9. Antibody screen is negative. ABO group plus Rh type is A-negative. PT/INR is 12.5/0.9. Urinalysis is essentially unremarkable. Lactic acid, cardiopulmonary, is 0.9.  RADIOLOGIC DATA: CT scan of the abdomen and pelvis done without contrast: Linear areas of increased density project within the right and left bases. Noncontrast evaluation of the liver and the spleen, adrenals and kidneys are unremarkable. Evaluation of the bowel demonstrates bowel wall thickening in the region of the splenic flexure. Sigmoid colon has the appearance of diffuse fecal retention within the colon. No evidence of surrounding free fluid, inflammatory changes of loculated fluid collections and no evidence of free air. Findings are felt to be consistent with colitis involving splenic flexure region of the colon. Differential considerations are infectious versus inflammatory. Ischemic cannot be completely excluded, although this is the lower differential consideration. Malposition of IVC filter, chronic lung disease within the lung bases. Cardiomegaly.   IMPRESSION:  1. Lower gastrointestinal bleed. Differential inclusive of inflammatory/infectious versus ischemic in nature.  2. Known history of hypercholesteremia, hypertension, deep vein thrombosis requiring IVC filter placement.  3. Syncopal episode Tuesday of this week, sustained injury to right shoulder.  4. Mild anemia.   PLAN: The patient's presentation was discussed with Dr. Verdie Shire. Recommendation is to proceed forward with diagnostic colonoscopy to allow direct luminal evaluation of the lower GI  tract to evaluate for evidence of ischemia as well as malignancy and blood loss source. The patient  declines proceeding forward with endoscopic evaluation at this time. In fact, she states she is ready to go home. Continues with left lower quadrant abdominal pain and do feel that the patient warrants additional monitoring to make sure that bleeding does not recur. We will continue to follow the patient during her hospitalization inclusive of hemodynamic status as well as laboratory results. The patient should follow up with [place] on an outpatient basis to rediscuss and evaluate the consideration of endoscopic evaluation.   These services provided by Payton Emerald, MS, APRN, [facility], FNP under collaborative agreement with Verdie Shire, MD.  ____________________________ Payton Emerald, NP dsh:sb D: 08/05/2012 12:01:00 ET T: 08/05/2012 13:37:36 ET JOB#: [id number]  cc: Payton Emerald, NP, <Dictator> Payton Emerald MD ELECTRONICALLY SIGNED 08/27/2012 14:00

## 2014-12-14 DIAGNOSIS — L97222 Non-pressure chronic ulcer of left calf with fat layer exposed: Secondary | ICD-10-CM | POA: Diagnosis not present

## 2014-12-14 DIAGNOSIS — S40812A Abrasion of left upper arm, initial encounter: Secondary | ICD-10-CM | POA: Diagnosis not present

## 2014-12-14 DIAGNOSIS — L97821 Non-pressure chronic ulcer of other part of left lower leg limited to breakdown of skin: Secondary | ICD-10-CM | POA: Diagnosis not present

## 2014-12-14 DIAGNOSIS — I10 Essential (primary) hypertension: Secondary | ICD-10-CM | POA: Diagnosis not present

## 2014-12-21 ENCOUNTER — Encounter: Payer: Medicare Other | Attending: Surgery | Admitting: Surgery

## 2014-12-21 DIAGNOSIS — F0390 Unspecified dementia without behavioral disturbance: Secondary | ICD-10-CM | POA: Diagnosis not present

## 2014-12-21 DIAGNOSIS — W19XXXA Unspecified fall, initial encounter: Secondary | ICD-10-CM | POA: Diagnosis not present

## 2014-12-21 DIAGNOSIS — L97222 Non-pressure chronic ulcer of left calf with fat layer exposed: Secondary | ICD-10-CM | POA: Insufficient documentation

## 2014-12-21 DIAGNOSIS — S40812A Abrasion of left upper arm, initial encounter: Secondary | ICD-10-CM | POA: Insufficient documentation

## 2014-12-21 DIAGNOSIS — R600 Localized edema: Secondary | ICD-10-CM | POA: Insufficient documentation

## 2014-12-21 DIAGNOSIS — L97821 Non-pressure chronic ulcer of other part of left lower leg limited to breakdown of skin: Secondary | ICD-10-CM | POA: Diagnosis not present

## 2014-12-21 DIAGNOSIS — I10 Essential (primary) hypertension: Secondary | ICD-10-CM | POA: Diagnosis not present

## 2014-12-22 NOTE — Progress Notes (Signed)
Kayla, Galloway ([id number]) Visit Report for 12/21/2014 Arrival Information Details Patient Name: Kayla Galloway, Kayla Galloway. Date of Service: 12/21/2014 11:30 AM Medical Record Number: [id number] Patient Account Number: [id number] Date of Birth/Sex: 01-11-1921 (81 y.o. Female) Treating RN: Afful, RN, BSN, Velva Harman Primary Care Physician: [name] Other Clinician: Referring Physician: [name] Treating Physician/Extender: Frann Rider in Treatment: 21 Visit Information History Since Last Visit Any new allergies or adverse reactions: No Patient Arrived: Kayla Galloway Had a fall or experienced change in No Arrival Time: 11:39 activities of daily living that may affect Accompanied By: sister risk of falls: Transfer Assistance: None Signs or symptoms of abuse/neglect since last No Patient Identification Verified: Yes visito Secondary Verification Process Yes Hospitalized since last visit: No Completed: Has Dressing in Place as Prescribed: Yes Patient Has Alerts: Yes Pain Present Now: No Patient Alerts: unable to get BLE ABI R/T pain Electronic Signature(s) Signed: 12/21/2014 5:21:09 PM By: Regan Lemming BSN, RN Entered By: Regan Lemming on 12/21/2014 11:39:52 Majid, [place]. ([id number]) -------------------------------------------------------------------------------- Clinic Level of Care Assessment Details Patient Name: Carren Galloway. Date of Service: 12/21/2014 11:30 AM Medical Record Number: [id number] Patient Account Number: [id number] Date of Birth/Sex: February 06, 1921 (81 y.o. Female) Treating RN: Montey Hora Primary Care Physician: [name] Other Clinician: Referring Physician: [name] Treating Physician/Extender: Frann Rider in Treatment: 21 Clinic Level of Care Assessment Items TOOL 4 Quantity Score []  - Use when only an EandM is performed on FOLLOW-UP visit 0 ASSESSMENTS - Nursing Assessment / Reassessment X - Reassessment of Co-morbidities  (includes updates in patient status) 1 10 X - Reassessment of Adherence to Treatment Plan 1 5 ASSESSMENTS - Wound and Skin Assessment / Reassessment X - Simple Wound Assessment / Reassessment - one wound 1 5 []  - Complex Wound Assessment / Reassessment - multiple wounds 0 []  - Dermatologic / Skin Assessment (not related to wound area) 0 ASSESSMENTS - Focused Assessment X - Circumferential Edema Measurements - multi extremities 1 5 []  - Nutritional Assessment / Counseling / Intervention 0 X - Lower Extremity Assessment (monofilament, tuning fork, pulses) 1 5 []  - Peripheral Arterial Disease Assessment (using hand held doppler) 0 ASSESSMENTS - Ostomy and/or Continence Assessment and Care []  - Incontinence Assessment and Management 0 []  - Ostomy Care Assessment and Management (repouching, etc.) 0 PROCESS - Coordination of Care X - Simple Patient / Family Education for ongoing care 1 15 []  - Complex (extensive) Patient / Family Education for ongoing care 0 []  - Staff obtains Programmer, systems, Records, Test Results / Process Orders 0 []  - Staff telephones HHA, Nursing Homes / Clarify orders / etc 0 []  - Routine Transfer to another Facility (non-emergent condition) 0 Pandya, Shawnette P. ([id number]) []  - Routine Hospital Admission (non-emergent condition) 0 []  - New Admissions / Biomedical engineer / Ordering NPWT, Apligraf, etc. 0 []  - Emergency Hospital Admission (emergent condition) 0 X - Simple Discharge Coordination 1 10 []  - Complex (extensive) Discharge Coordination 0 PROCESS - Special Needs []  - Pediatric / Minor Patient Management 0 []  - Isolation Patient Management 0 []  - Hearing / Language / Visual special needs 0 []  - Assessment of Community assistance (transportation, D/C planning, etc.) 0 []  - Additional assistance / Altered mentation 0 []  - Support Surface(s) Assessment (bed, cushion, seat, etc.) 0 INTERVENTIONS - Wound Cleansing / Measurement X - Simple Wound Cleansing - one  wound 1 5 []  - Complex Wound Cleansing - multiple wounds 0 X - Wound Imaging (photographs - any number of wounds) 1  5 []  - Wound Tracing (instead of photographs) 0 X - Simple Wound Measurement - one wound 1 5 []  - Complex Wound Measurement - multiple wounds 0 INTERVENTIONS - Wound Dressings []  - Small Wound Dressing one or multiple wounds 0 []  - Medium Wound Dressing one or multiple wounds 0 []  - Large Wound Dressing one or multiple wounds 0 []  - Application of Medications - topical 0 []  - Application of Medications - injection 0 INTERVENTIONS - Miscellaneous []  - External ear exam 0 Hardage, Samanvitha P. ([id number]) []  - Specimen Collection (cultures, biopsies, blood, body fluids, etc.) 0 []  - Specimen(s) / Culture(s) sent or taken to Lab for analysis 0 []  - Patient Transfer (multiple staff / Harrel Lemon Lift / Similar devices) 0 []  - Simple Staple / Suture removal (25 or less) 0 []  - Complex Staple / Suture removal (26 or more) 0 []  - Hypo / Hyperglycemic Management (close monitor of Blood Glucose) 0 []  - Ankle / Brachial Index (ABI) - do not check if billed separately 0 X - Vital Signs 1 5 Has the patient been seen at the hospital within the last three years: Yes Total Score: 75 Level Of Care: New/Established - Level 2 Electronic Signature(s) Signed: 12/21/2014 5:45:27 PM By: Montey Hora Entered By: Montey Hora on 12/21/2014 11:56:08 Lubben, Lulia Mamie Nick ([id number]) -------------------------------------------------------------------------------- Encounter Discharge Information Details Patient Name: Kayla Maizes P. Date of Service: 12/21/2014 11:30 AM Medical Record Number: [id number] Patient Account Number: [id number] Date of Birth/Sex: Jan 12, 1921 (81 y.o. Female) Treating RN: Montey Hora Primary Care Physician: [name] Other Clinician: Referring Physician: [name] Treating Physician/Extender: Frann Rider in Treatment: 21 Encounter Discharge  Information Items Discharge Pain Level: 0 Discharge Condition: Stable Ambulatory Status: Cane Discharge Destination: Home Transportation: Private Auto Accompanied By: sister Schedule Follow-up Appointment: Yes Medication Reconciliation completed No and provided to Patient/Care Veronia Laprise: Provided on Clinical Summary of Care: 12/21/2014 Form Type Recipient Paper Patient GS Electronic Signature(s) Signed: 12/21/2014 12:04:04 PM By: Ruthine Dose Entered By: Ruthine Dose on 12/21/2014 12:04:03 Raben, Lawernce Keas ([id number]) -------------------------------------------------------------------------------- Lower Extremity Assessment Details Patient Name: Menser, Itzae P. Date of Service: 12/21/2014 11:30 AM Medical Record Number: [id number] Patient Account Number: [id number] Date of Birth/Sex: 1921/04/12 (81 y.o. Female) Treating RN: Afful, RN, BSN, Velva Harman Primary Care Physician: [name] Other Clinician: Referring Physician: [name] Treating Physician/Extender: Frann Rider in Treatment: 21 Edema Assessment Assessed: [Left: No] [Right: No] Edema: [Left: Ye] [Right: s] Calf Left: Right: Point of Measurement: 31 cm From Medial Instep 35.6 cm cm Ankle Left: Right: Point of Measurement: 11 cm From Medial Instep 24.2 cm cm Vascular Assessment Pulses: Posterior Tibial Dorsalis Pedis Palpable: [Left:Yes] Extremity colors, hair growth, and conditions: Extremity Color: [Left:Mottled] Hair Growth on Extremity: [Left:No] Temperature of Extremity: [Left:Warm] Capillary Refill: [Left:< 3 seconds] Dependent Rubor: [Left:No] Blanched when Elevated: [Left:No] Lipodermatosclerosis: [Left:No] Toe Nail Assessment Left: Right: Thick: No Discolored: No Deformed: No Improper Length and Hygiene: No Electronic Signature(s) Signed: 12/21/2014 5:21:09 PM By: Regan Lemming BSN, RN Berhow, Krystyna P. ([id number]) Entered By: Regan Lemming on 12/21/2014 11:43:08 Peart, Sharday  P. ([id number]) -------------------------------------------------------------------------------- Multi Wound Chart Details Patient Name: Dunton, Graycee P. Date of Service: 12/21/2014 11:30 AM Medical Record Number: [id number] Patient Account Number: [id number] Date of Birth/Sex: December 14, 1920 (81 y.o. Female) Treating RN: Montey Hora Primary Care Physician: [name] Other Clinician: Referring Physician: [name] Treating Physician/Extender: Frann Rider in Treatment: 21 Vital Signs Height(in): 61 Pulse(bpm): 64 Weight(lbs): 119.7 Blood Pressure 138/51 (mmHg): Body Mass Index(BMI):  23 Temperature(F): Respiratory Rate 16 (breaths/min): Photos: [3:No Photos] [N/A:N/A] Wound Location: [3:Left Lower Leg - Lateral] [N/A:N/A] Wounding Event: [3:Trauma] [N/A:N/A] Primary Etiology: [3:Skin Tear] [N/A:N/A] Secondary Etiology: [3:Trauma, Other] [N/A:N/A] Comorbid History: [3:Cataracts, Hypertension, Gout, Osteoarthritis] [N/A:N/A] Date Acquired: [3:07/11/2014] [N/A:N/A] Weeks of Treatment: [3:21] [N/A:N/A] Wound Status: [3:Open] [N/A:N/A] Measurements L x W x D 0.1x0.1x0.1 [N/A:N/A] (cm) Area (cm) : [3:0.008] [N/A:N/A] Volume (cm) : [3:0.001] [N/A:N/A] % Reduction in Area: [3:100.00%] [N/A:N/A] % Reduction in Volume: 100.00% [N/A:N/A] Classification: [3:Partial Thickness] [N/A:N/A] Exudate Amount: [3:None Present] [N/A:N/A] Wound Margin: [3:Flat and Intact] [N/A:N/A] Granulation Amount: [3:Large (67-100%)] [N/A:N/A] Necrotic Amount: [3:Small (1-33%)] [N/A:N/A] Exposed Structures: [3:Fascia: No Fat: No Tendon: No Muscle: No Joint: No Bone: No Limited to Skin Breakdown] [N/A:N/A] Epithelialization: [3:Large (67-100%)] [N/A:N/A] Periwound Skin Texture: Edema: Yes N/A N/A Excoriation: No Induration: No Callus: No Crepitus: No Fluctuance: No Friable: No Rash: No Scarring: No Periwound Skin Maceration: No N/A N/A Moisture: Moist: No Dry/Scaly:  No Periwound Skin Color: Atrophie Blanche: No N/A N/A Cyanosis: No Ecchymosis: No Erythema: No Hemosiderin Staining: No Mottled: No Pallor: No Rubor: No Temperature: No Abnormality N/A N/A Tenderness on Yes N/A N/A Palpation: Wound Preparation: Ulcer Cleansing: N/A N/A Rinsed/Irrigated with Saline Topical Anesthetic Applied: Other: lidocaine 4% Treatment Notes Electronic Signature(s) Signed: 12/21/2014 5:45:27 PM By: Montey Hora Entered By: Montey Hora on 12/21/2014 11:53:52 Perales, Lawernce Keas ([id number]) -------------------------------------------------------------------------------- Multi-Disciplinary Care Plan Details Patient Name: Kayla Maizes P. Date of Service: 12/21/2014 11:30 AM Medical Record Number: [id number] Patient Account Number: [id number] Date of Birth/Sex: 11-15-1920 (81 y.o. Female) Treating RN: Montey Hora Primary Care Physician: [name] Other Clinician: Referring Physician: [name] Treating Physician/Extender: Frann Rider in Treatment: 21 Active Inactive Electronic Signature(s) Signed: 12/21/2014 5:45:27 PM By: Montey Hora Entered By: Montey Hora on 12/21/2014 11:55:08 Chambless, Nandita P. ([id number]) -------------------------------------------------------------------------------- Pain Assessment Details Patient Name: Forner, Bich P. Date of Service: 12/21/2014 11:30 AM Medical Record Number: [id number] Patient Account Number: [id number] Date of Birth/Sex: May 21, 1921 (81 y.o. Female) Treating RN: Baruch Gouty, RN, BSN, Velva Harman Primary Care Physician: [name] Other Clinician: Referring Physician: [name] Treating Physician/Extender: Frann Rider in Treatment: 21 Active Problems Location of Pain Severity and Description of Pain Patient Has Paino No Site Locations Pain Management and Medication Current Pain Management: Electronic Signature(s) Signed: 12/21/2014 5:21:09 PM By: Regan Lemming BSN,  RN Entered By: Regan Lemming on 12/21/2014 11:40:00 Scoles, Lawernce Keas ([id number]) -------------------------------------------------------------------------------- Patient/Caregiver Education Details Patient Name: Carren Galloway. Date of Service: 12/21/2014 11:30 AM Medical Record Number: [id number] Patient Account Number: [id number] Date of Birth/Gender: [birth year] (81 y.o. Female) Treating RN: Montey Hora Primary Care Physician: [name] Other Clinician: Referring Physician: [name] Treating Physician/Extender: Frann Rider in Treatment: 21 Education Assessment Education Provided To: Patient and Caregiver Education Topics Provided Wound/Skin Impairment: Handouts: Other: continued skin care and compression use Methods: Explain/Verbal Responses: State content correctly Electronic Signature(s) Signed: 12/21/2014 5:45:27 PM By: Montey Hora Entered By: Montey Hora on 12/21/2014 11:54:26 Kurka, Zakiyyah P. ([id number]) -------------------------------------------------------------------------------- Wound Assessment Details Patient Name: Pigman, Avarose P. Date of Service: 12/21/2014 11:30 AM Medical Record Number: [id number] Patient Account Number: [id number] Date of Birth/Sex: 1921-05-27 (81 y.o. Female) Treating RN: Montey Hora Primary Care Physician: [name] Other Clinician: Referring Physician: [name] Treating Physician/Extender: Frann Rider in Treatment: 21 Wound Status Wound Number: 3 Primary Etiology: Skin Tear Wound Location: Left, Lateral Lower Leg Secondary Trauma, Other Etiology: Wounding Event: Trauma Wound Status: Healed - Epithelialized Date Acquired: 07/11/2014 Comorbid Cataracts, Hypertension,  Gout, Weeks Of Treatment: 21 History: Osteoarthritis Clustered Wound: No Photos Photo Uploaded By: Regan Lemming on 12/21/2014 17:10:27 Wound Measurements Length: (cm) 0 % Reduction in Width: (cm) 0 % Reduction  in Depth: (cm) 0 Epithelializat Area: (cm) 0 Tunneling: Volume: (cm) 0 Area: 100% Volume: 100% ion: Large (67-100%) No Wound Description Classification: Partial Thickness Wound Margin: Flat and Intact Exudate Amount: None Present Foul Odor After Cleansing: No Wound Bed Granulation Amount: Large (67-100%) Exposed Structure Necrotic Amount: Small (1-33%) Fascia Exposed: No Fat Layer Exposed: No Tendon Exposed: No Muscle Exposed: No Joint Exposed: No Deady, Ian P. ([id number]) Bone Exposed: No Limited to Skin Breakdown Periwound Skin Texture Texture Color No Abnormalities Noted: No No Abnormalities Noted: No Callus: No Atrophie Blanche: No Crepitus: No Cyanosis: No Excoriation: No Ecchymosis: No Fluctuance: No Erythema: No Friable: No Hemosiderin Staining: No Induration: No Mottled: No Localized Edema: Yes Pallor: No Rash: No Rubor: No Scarring: No Temperature / Pain Moisture Temperature: No Abnormality No Abnormalities Noted: No Tenderness on Palpation: Yes Dry / Scaly: No Maceration: No Moist: No Wound Preparation Ulcer Cleansing: Rinsed/Irrigated with Saline Topical Anesthetic Applied: Other: lidocaine 4%, Electronic Signature(s) Signed: 12/21/2014 5:45:27 PM By: Montey Hora Entered By: Montey Hora on 12/21/2014 11:54:48 Beman, Shantia Mamie Nick ([id number]) -------------------------------------------------------------------------------- Vitals Details Patient Name: Tubbs, Tiffinie P. Date of Service: 12/21/2014 11:30 AM Medical Record Number: [id number] Patient Account Number: [id number] Date of Birth/Sex: 1921/01/21 (81 y.o. Female) Treating RN: Afful, RN, BSN, Velva Harman Primary Care Physician: [name] Other Clinician: Referring Physician: [name] Treating Physician/Extender: Frann Rider in Treatment: 21 Vital Signs Time Taken: 11:48 Pulse (bpm): 64 Height (in): 61 Respiratory Rate (breaths/min): 16 Weight (lbs):  119.7 Blood Pressure (mmHg): 138/51 Body Mass Index (BMI): 22.6 Reference Range: 80 - 120 mg / dl Electronic Signature(s) Signed: 12/21/2014 5:21:09 PM By: Regan Lemming BSN, RN Entered By: Regan Lemming on 12/21/2014 11:48:38

## 2014-12-22 NOTE — Progress Notes (Signed)
CRICKETT, ABBETT ([id number]) Visit Report for 12/21/2014 Chief Complaint Document Details Patient Name: Kayla Galloway, Kayla Galloway. Date of Service: 12/21/2014 11:30 AM Medical Record Patient Account Number: [id number] Number: Afful, RN, BSN, Treating RN: 18-Aug-1921 [phone] y.o. Kayla Galloway Date of Birth/Sex: Female) Other Clinician: Primary Care Physician: [name] Treating Christin Fudge Referring Physician: [name] Physician/Extender: Suella Grove in Treatment: 21 Information Obtained from: Patient Chief Complaint Patient seen for complaints of Non-Healing Wound. she says overall she is doing fine and has no fresh issues. Electronic Signature(s) Signed: 12/21/2014 12:22:46 PM By: Christin Fudge MD, FACS Entered By: Christin Fudge on 12/21/2014 11:59:52 Leas, Lawernce Keas ([id number]) -------------------------------------------------------------------------------- HPI Details Patient Name: Guitron, Tykeisha P. Date of Service: 12/21/2014 11:30 AM Medical Record Patient Account Number: [id number] Number: Afful, RN, BSN, Treating RN: [birth year] [phone] y.o. Kayla Galloway Date of Birth/Sex: Female) Other Clinician: Primary Care Physician: [name] Treating Christin Fudge Referring Physician: [name] Physician/Extender: Weeks in Treatment: 21 History of Present Illness Location: left lower extremity Severity: mild to moderate Duration: 2 weeks Timing: the patient fell on concrete 2 weeks ago. She presented to the ER at that time. She had her wound sutured at that time. Context: history of venous insufficiency and does regularly wear compression garments. Modifying Factors: she is currently on Bactrim. Associated Signs and Symptoms: denies fevers HPI Description: The patient is a 81 year old female with history of hypertension and multiple back surgeries who presents with 2 posterior left lower extremity. she first presented to [place] 1 week ago on 07/24/2013. 2 weeks ago she  had a fall on concrete. The time she went to the emergency room. In the ER she was sutured up. She is currently on Bactrim. She has had the majority of her sutures removed. 10/12/14 -- have been using Hydrofera Blue and have had no problems in the recent past. 10/19/14 -- since last week we have been doing the dressing with Prisma and the caregiver says that has been no problem and its effect doing really well. 10/26/2014 -- After having an inadvertent abrasion to her left elbow there been doing a local dressing there. her left leg is doing well otherwise. 10/26/14 -- 2 days ago she inadvertently scraped her hand on a screening though and hurt her left elbow region. she has a lacerated open wound there. The left lower extremity is doing fine otherwise. 11/16/2014 she says she has having some swelling and a little bit of pain in her left lower extremity. she is otherwise doing fine and has no fresh issues. 12/07/2014 on Sunday she spilt some coffee on her compression bandage and hence she opened it out and left it open for all these days and her leg has swollen up quite significantly. she did not burn herself. 12/14/2014 -- this is the second week in a row she has removed her compression stockings and she says she finds that if she spelled something she just takes it out. I believe it's a combination of her dementia and the fact that she does not want to wear compressions. Last week I had urged her to come back earlier if her compression bandage comes off but she and her sister have both not come any earlier. Electronic Signature(s) Signed: 12/21/2014 12:22:46 PM By: Christin Fudge MD, FACS Entered By: Christin Fudge on 12/21/2014 11:59:56 Krone, Lawernce Keas ([id number]) -------------------------------------------------------------------------------- Physical Exam Details Patient Name: Galloway, Kayla P. Date of Service: 12/21/2014 11:30 AM Medical Record Patient Account Number:  [id number] Number: Afful, RN, BSN, Treating RN:  06-09-1921 (81 y.o. Kayla Galloway Date of Birth/Sex: Female) Other Clinician: Primary Care Physician: [name] Treating Christin Fudge Referring Physician: [name] Physician/Extender: Weeks in Treatment: 21 Constitutional . Pulse regular. Respirations normal and unlabored. Afebrile. . Eyes Nonicteric. Reactive to light. Ears, Nose, Mouth, and Throat Lips, teeth, and gums WNL.Marland Kitchen Moist mucosa without lesions . Neck supple and nontender. No palpable supraclavicular or cervical adenopathy. Normal sized without goiter. Respiratory WNL. No retractions.. Breath sounds WNL, No rubs, rales, rhonchi, or wheeze.. Cardiovascular Heart rhythm and rate regular, no murmur or gallop.. Chest Breasts symmetical and no nipple discharge.. Breast tissue WNL, no masses, lumps, or tenderness.. Integumentary (Hair, Skin) the wound is completely healed and there is no evidence of any problems over the area.Marland Kitchen Psychiatric Judgement and insight Intact.. No evidence of depression, anxiety, or agitation.. Electronic Signature(s) Signed: 12/21/2014 12:22:46 PM By: Christin Fudge MD, FACS Entered By: Christin Fudge on 12/21/2014 12:00:37 ALGA, SOUTHALL ([id number]) -------------------------------------------------------------------------------- Physician Orders Details Patient Name: Kayla Galloway 12/21/2014 11:30 Date of Service: AM Medical Record [id number] Number: Patient Account Number: [id number] 1920/08/27 (81 y.o. Treating RN: Montey Hora Date of Birth/Sex: Female) Other Clinician: Primary Care Physician: [name] Treating Christin Fudge Referring Physician: [name] Physician/Extender: Suella Grove in Treatment: 21 Verbal / Phone Orders: Yes Clinician: Montey Hora Read Back and Verified: Yes Diagnosis Coding Discharge From [facility] Services o Discharge from [place] - continue with compression  hose Electronic Signature(s) Signed: 12/21/2014 12:22:46 PM By: Christin Fudge MD, FACS Signed: 12/21/2014 5:45:27 PM By: Montey Hora Entered By: Montey Hora on 12/21/2014 11:55:37 Hairston, Belvia P. ([id number]) -------------------------------------------------------------------------------- Problem List Details Patient Name: Haack, Kandise P. Date of Service: 12/21/2014 11:30 AM Medical Record Patient Account Number: [id number] Number: Afful, RN, BSN, Treating RN: [birth year] [phone] y.o. 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 ([id number]) -------------------------------------------------------------------------------- Progress Note Details Patient Name: Elena, Travia P. Date of Service: 12/21/2014 11:30 AM Medical Record Patient Account Number: [id number] Number: Afful, RN, BSN, Treating RN: February 19, 1921 [phone] y.o. Kayla Galloway Date of Birth/Sex: Female) Other Clinician: Primary Care Physician: [name] Treating Christin Fudge Referring Physician: [name] Physician/Extender: Suella Grove in Treatment: 21 Subjective Chief Complaint Information obtained from Patient Patient seen for complaints of Non-Healing Wound. she says overall she is doing fine and has no fresh issues. History of Present Illness (HPI) The following HPI elements were documented for the patient's wound: Location: left lower  extremity Severity: mild to moderate Duration: 2 weeks Timing: the patient fell on concrete 2 weeks ago. She presented to the ER at that time. She had her wound sutured at that time. Context: history of venous insufficiency and does regularly wear compression garments. Modifying Factors: she is currently on Bactrim. Associated Signs and Symptoms: denies fevers The patient is a 81 year old female with history of hypertension and multiple back surgeries who presents with 2 posterior left lower extremity. she first presented to [place] 1 week ago on 07/24/2013. 2 weeks ago she had a fall on concrete. The time she went to the emergency room. In the ER she was sutured  up. She is currently on Bactrim. She has had the majority of her sutures removed. 10/12/14 -- have been using Hydrofera Blue and have had no problems in the recent past. 10/19/14 -- since last week we have been doing the dressing with Prisma and the caregiver says that has been no problem and its effect doing really well. 10/26/2014 -- After having an inadvertent abrasion to her left elbow there been doing a local dressing there. her left leg is doing well otherwise. 10/26/14 -- 2 days ago she inadvertently scraped her hand on a screening though and hurt her left elbow region. she has a lacerated open wound there. The left lower extremity is doing fine otherwise. 11/16/2014 she says she has having some swelling and a little bit of pain in her left lower extremity. she is otherwise doing fine and has no fresh issues. 12/07/2014 on Sunday she spilt some coffee on her compression bandage and hence she opened it out and left it open for all these days and her leg has swollen up quite significantly. she did not burn herself. 12/14/2014 -- this is the second week in a row she has removed her compression stockings and she says she finds that if she spelled something she just takes it out. I believe it's a combination of her dementia and Rowland,  Arlys P. ([id number]) the fact that she does not want to wear compressions. Last week I had urged her to come back earlier if her compression bandage comes off but she and her sister have both not come any earlier. Objective Constitutional Pulse regular. Respirations normal and unlabored. Afebrile. Vitals Time Taken: 11:48 AM, Height: 61 in, Weight: 119.7 lbs, BMI: 22.6, Pulse: 64 bpm, Respiratory Rate: 16 breaths/min, Blood Pressure: 138/51 mmHg. Eyes Nonicteric. Reactive to light. Ears, Nose, Mouth, and Throat Lips, teeth, and gums WNL.Marland Kitchen Moist mucosa without lesions . Neck supple and nontender. No palpable supraclavicular or cervical adenopathy. Normal sized without goiter. Respiratory WNL. No retractions.. Breath sounds WNL, No rubs, rales, rhonchi, or wheeze.. Cardiovascular Heart rhythm and rate regular, no murmur or gallop.. Chest Breasts symmetical and no nipple discharge.. Breast tissue WNL, no masses, lumps, or tenderness.Marland Kitchen Psychiatric Judgement and insight Intact.. No evidence of depression, anxiety, or agitation.. Integumentary (Hair, Skin) the wound is completely healed and there is no evidence of any problems over the area.. Wound #3 status is Healed - Epithelialized. Original cause of wound was Trauma. The wound is located on the Left,Lateral Lower Leg. The wound measures 0cm length x 0cm width x 0cm depth; 0cm^2 area and 0cm^3 volume. The wound is limited to skin breakdown. There is no tunneling noted. There is a none present amount of drainage noted. The wound margin is flat and intact. There is large (67-100%) granulation within the wound bed. There is a small (1-33%) amount of necrotic tissue within the wound bed. The periwound skin appearance exhibited: Localized Edema. The periwound skin appearance did not exhibit: Callus, Crepitus, Excoriation, Fluctuance, Friable, Induration, Rash, Scarring, Dry/Scaly, Cressler, Reece P. ([id number]) Maceration, Moist, Atrophie  Blanche, Cyanosis, Ecchymosis, Hemosiderin Staining, Mottled, Pallor, Rubor, Erythema. Periwound temperature was noted as No Abnormality. The periwound has tenderness on palpation. Assessment Active Problems ICD-10 L97.222 - Non-pressure chronic ulcer of left calf with fat layer exposed S40.812A - Abrasion of left upper arm, initial encounter the wounds have completely healed and there is no problems of any other skin tears or any associated problems on her left lower extremity. Plan Discharge From [facility] Services: Discharge  from [place] - continue with compression hose We have discussed at length the need to have compression on both her lower extremities and I have tried to explain to her that she may need the assistance of a sister who lives next door to help on with the stockings. We have discussed wearing compression stockings from early morning until late at night before she goes to bed. I also discussed the need for elevation of the limbs as often as possible when she is sitting. She is discharged from the wound care services and will be seen back on an as-needed basis. Electronic Signature(s) Signed: 12/21/2014 12:22:46 PM By: Christin Fudge MD, FACS Amberg, Lawernce Keas ([id number]) Entered By: Christin Fudge on 12/21/2014 12:02:48

## 2014-12-25 ENCOUNTER — Telehealth: Payer: Self-pay | Admitting: *Deleted

## 2014-12-25 MED ORDER — SERTRALINE HCL 50 MG PO TABS: ORAL_TABLET | ORAL | Status: DC

## 2014-12-25 MED ORDER — RAMIPRIL 5 MG PO CAPS: ORAL_CAPSULE | ORAL | Status: DC

## 2014-12-25 MED ORDER — OMEPRAZOLE 20 MG PO CPDR: DELAYED_RELEASE_CAPSULE | ORAL | Status: DC

## 2014-12-25 NOTE — Telephone Encounter (Signed)
Pt had left VM, needing refills to mail order. Spoke to pt, notified on need for appt. Appt scheduled, Rx sent to pharmacy by escript

## 2014-12-26 DIAGNOSIS — I10 Essential (primary) hypertension: Secondary | ICD-10-CM | POA: Diagnosis not present

## 2014-12-26 DIAGNOSIS — M4850XD Collapsed vertebra, not elsewhere classified, site unspecified, subsequent encounter for fracture with routine healing: Secondary | ICD-10-CM | POA: Diagnosis not present

## 2015-01-12 ENCOUNTER — Emergency Department: Payer: Medicare Other

## 2015-01-12 ENCOUNTER — Inpatient Hospital Stay
Admission: EM | Admit: 2015-01-12 | Discharge: 2015-01-15 | DRG: 603 | Disposition: A | Discharge status: Home / Self Care | Payer: Medicare Other | Attending: Specialist | Admitting: Specialist

## 2015-01-12 DIAGNOSIS — Z87442 Personal history of urinary calculi: Secondary | ICD-10-CM | POA: Diagnosis not present

## 2015-01-12 DIAGNOSIS — Z8249 Family history of ischemic heart disease and other diseases of the circulatory system: Secondary | ICD-10-CM | POA: Diagnosis not present

## 2015-01-12 DIAGNOSIS — D649 Anemia, unspecified: Secondary | ICD-10-CM | POA: Diagnosis not present

## 2015-01-12 DIAGNOSIS — Z85828 Personal history of other malignant neoplasm of skin: Secondary | ICD-10-CM | POA: Diagnosis not present

## 2015-01-12 DIAGNOSIS — Z9889 Other specified postprocedural states: Secondary | ICD-10-CM | POA: Diagnosis not present

## 2015-01-12 DIAGNOSIS — M79662 Pain in left lower leg: Secondary | ICD-10-CM | POA: Diagnosis not present

## 2015-01-12 DIAGNOSIS — M79605 Pain in left leg: Secondary | ICD-10-CM | POA: Diagnosis not present

## 2015-01-12 DIAGNOSIS — M109 Gout, unspecified: Secondary | ICD-10-CM | POA: Diagnosis not present

## 2015-01-12 DIAGNOSIS — E780 Pure hypercholesterolemia: Secondary | ICD-10-CM | POA: Diagnosis present

## 2015-01-12 DIAGNOSIS — I739 Peripheral vascular disease, unspecified: Secondary | ICD-10-CM | POA: Diagnosis present

## 2015-01-12 DIAGNOSIS — I10 Essential (primary) hypertension: Secondary | ICD-10-CM | POA: Diagnosis not present

## 2015-01-12 DIAGNOSIS — Z821 Family history of blindness and visual loss: Secondary | ICD-10-CM

## 2015-01-12 DIAGNOSIS — Z880 Allergy status to penicillin: Secondary | ICD-10-CM

## 2015-01-12 DIAGNOSIS — L03116 Cellulitis of left lower limb: Secondary | ICD-10-CM

## 2015-01-12 DIAGNOSIS — K219 Gastro-esophageal reflux disease without esophagitis: Secondary | ICD-10-CM | POA: Diagnosis present

## 2015-01-12 DIAGNOSIS — M1990 Unspecified osteoarthritis, unspecified site: Secondary | ICD-10-CM | POA: Diagnosis present

## 2015-01-12 DIAGNOSIS — Z86718 Personal history of other venous thrombosis and embolism: Secondary | ICD-10-CM

## 2015-01-12 DIAGNOSIS — I129 Hypertensive chronic kidney disease with stage 1 through stage 4 chronic kidney disease, or unspecified chronic kidney disease: Secondary | ICD-10-CM | POA: Diagnosis present

## 2015-01-12 DIAGNOSIS — E785 Hyperlipidemia, unspecified: Secondary | ICD-10-CM | POA: Diagnosis not present

## 2015-01-12 DIAGNOSIS — R600 Localized edema: Secondary | ICD-10-CM | POA: Diagnosis not present

## 2015-01-12 DIAGNOSIS — L237 Allergic contact dermatitis due to plants, except food: Secondary | ICD-10-CM | POA: Diagnosis present

## 2015-01-12 DIAGNOSIS — Z885 Allergy status to narcotic agent status: Secondary | ICD-10-CM | POA: Diagnosis not present

## 2015-01-12 DIAGNOSIS — Z807 Family history of other malignant neoplasms of lymphoid, hematopoietic and related tissues: Secondary | ICD-10-CM | POA: Diagnosis not present

## 2015-01-12 DIAGNOSIS — N183 Chronic kidney disease, stage 3 (moderate): Secondary | ICD-10-CM | POA: Diagnosis not present

## 2015-01-12 DIAGNOSIS — M810 Age-related osteoporosis without current pathological fracture: Secondary | ICD-10-CM | POA: Diagnosis present

## 2015-01-12 DIAGNOSIS — R21 Rash and other nonspecific skin eruption: Secondary | ICD-10-CM | POA: Diagnosis not present

## 2015-01-12 DIAGNOSIS — Z888 Allergy status to other drugs, medicaments and biological substances status: Secondary | ICD-10-CM

## 2015-01-12 DIAGNOSIS — F329 Major depressive disorder, single episode, unspecified: Secondary | ICD-10-CM | POA: Diagnosis not present

## 2015-01-12 DIAGNOSIS — L0390 Cellulitis, unspecified: Secondary | ICD-10-CM | POA: Diagnosis present

## 2015-01-12 DIAGNOSIS — I82412 Acute embolism and thrombosis of left femoral vein: Secondary | ICD-10-CM | POA: Diagnosis not present

## 2015-01-12 LAB — BASIC METABOLIC PANEL
Anion gap: 9 (ref 5–15)
BUN: 21 mg/dL — AB (ref 6–20)
CO2: 26 mmol/L (ref 22–32)
CREATININE: 1.04 mg/dL — AB (ref 0.44–1.00)
Calcium: 9.1 mg/dL (ref 8.9–10.3)
Chloride: 98 mmol/L — ABNORMAL LOW (ref 101–111)
GFR calc non Af Amer: 46 mL/min — ABNORMAL LOW (ref >60)
GFR, EST AFRICAN AMERICAN: 53 mL/min — AB (ref >60)
GLUCOSE: 102 mg/dL — AB (ref 65–99)
POTASSIUM: 4.2 mmol/L (ref 3.5–5.1)
SODIUM: 133 mmol/L — AB (ref 135–145)

## 2015-01-12 LAB — CBC
HEMATOCRIT: 33 % — AB (ref 35.0–47.0)
Hemoglobin: 10.9 g/dL — ABNORMAL LOW (ref 12.0–16.0)
MCH: 32.3 pg (ref 26.0–34.0)
MCHC: 33 g/dL (ref 32.0–36.0)
MCV: 97.8 fL (ref 80.0–100.0)
Platelets: 183 10*3/uL (ref 150–440)
RBC: 3.38 MIL/uL — ABNORMAL LOW (ref 3.80–5.20)
RDW: 14.4 % (ref 11.5–14.5)
WBC: 5.7 10*3/uL (ref 3.6–11.0)

## 2015-01-12 MED ORDER — VANCOMYCIN HCL IN DEXTROSE 1-5 GM/200ML-% IV SOLN
1000.0000 mg | Freq: Once | INTRAVENOUS | Status: AC
  Administered 2015-01-12: 1000 mg via INTRAVENOUS

## 2015-01-12 MED ORDER — ACETAMINOPHEN 650 MG RE SUPP: 650.0000 mg | Freq: Four times a day (QID) | RECTAL | Status: DC | PRN

## 2015-01-12 MED ORDER — PANTOPRAZOLE SODIUM 40 MG PO TBEC
40.0000 mg | DELAYED_RELEASE_TABLET | Freq: Every day | ORAL | Status: DC
  Administered 2015-01-13 – 2015-01-15 (×3): 40 mg via ORAL
  Filled 2015-01-12 (×3): qty 1

## 2015-01-12 MED ORDER — VANCOMYCIN HCL IN DEXTROSE 1-5 GM/200ML-% IV SOLN
INTRAVENOUS | Status: AC
  Administered 2015-01-12: 1000 mg via INTRAVENOUS
  Filled 2015-01-12: qty 200

## 2015-01-12 MED ORDER — HEPARIN SODIUM (PORCINE) 5000 UNIT/ML IJ SOLN
5000.0000 [IU] | Freq: Three times a day (TID) | INTRAMUSCULAR | Status: DC
  Administered 2015-01-12 – 2015-01-15 (×8): 5000 [IU] via SUBCUTANEOUS
  Filled 2015-01-12 (×8): qty 1

## 2015-01-12 MED ORDER — ACETAMINOPHEN 325 MG PO TABS
650.0000 mg | ORAL_TABLET | Freq: Four times a day (QID) | ORAL | Status: DC | PRN
  Administered 2015-01-12 – 2015-01-14 (×4): 650 mg via ORAL
  Filled 2015-01-12 (×4): qty 2

## 2015-01-12 MED ORDER — POLYETHYLENE GLYCOL 3350 17 G PO PACK
17.0000 g | PACK | Freq: Every day | ORAL | Status: DC | PRN
  Administered 2015-01-13: 17 g via ORAL
  Filled 2015-01-12: qty 1

## 2015-01-12 MED ORDER — SERTRALINE HCL 50 MG PO TABS
50.0000 mg | ORAL_TABLET | Freq: Every day | ORAL | Status: DC
  Administered 2015-01-13 – 2015-01-15 (×3): 50 mg via ORAL
  Filled 2015-01-12 (×3): qty 1

## 2015-01-12 MED ORDER — SODIUM CHLORIDE 0.9 % IV BOLUS (SEPSIS)
500.0000 mL | Freq: Once | INTRAVENOUS | Status: AC
  Administered 2015-01-12: 500 mL via INTRAVENOUS

## 2015-01-12 NOTE — ED Notes (Signed)
Patient transported to Ultrasound 

## 2015-01-12 NOTE — ED Notes (Signed)
Pt c/o swelling and redness that has progressed over the past 3 weeks ..states she has been released from wound care center the first of the month and now having worsening sx.Kayla Galloway

## 2015-01-12 NOTE — H&P (Signed)
[facility] NAME: Shilo Philipson    MR#:  [id number]  DATE OF BIRTH:  Jan 16, 1921  DATE OF ADMISSION:  01/12/2015  PRIMARY CARE PHYSICIAN: [name], MD   REQUESTING/REFERRING PHYSICIAN: Dr. Joni Fears  CHIEF COMPLAINT:   Chief Complaint  Patient presents with  . Leg Swelling    HISTORY OF PRESENT ILLNESS:  Kayla Galloway  is a 81 y.o. female with a known history of hypertension, hyperlipidemia and gout left leg injury in November 2015 who had been followed by the wound Center for nonhealing wounds until 12/21/2014 presents today with worsening swelling, erythema, pain in the left leg. She has not had any fevers or chills. She has not had any change in her appetite no nausea vomiting diarrhea or weakness. She does have a new rash over the left face and left lip which she reports is due to poison ivy started 1 week ago and she does not feel it's related to the left leg symptoms. She has not had any weeping ulceration no purulent drainage.  PAST MEDICAL HISTORY:   Past Medical History  Diagnosis Date  . Arthritis   . Osteoporosis   . Hypercholesterolemia   . Anemia   . GERD (gastroesophageal reflux disease)   . DVT (deep venous thrombosis), left     bilateral, IVC filter 2010  . Depression   . Gout   . Nephrolithiasis   . 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 Renal vein thrombosis     previous renal insufficiency  . Hypertension     Dr. [name]  . Cancer     skin ca lesion of scalp- removed  . Peripheral vascular disease   . Spider veins     PAST SURGICAL HISTORY:   Past Surgical History  Procedure Laterality Date  . Fracture surgery  2009    hip  . Right oophorectomy      partial-  . Eye surgery      bil eyes  . Colonsopy    . Tonsillectomy      age 81  . Skin cancer excision      top of head  . Ivc filter      pt states it has turned side ways  and could not be removed.  . Vertebroplasty  12/31/2011    Procedure: VERTEBROPLASTY;  Surgeon: Winfield Cunas, MD;  Location: [place];  Service: Neurosurgery;  Laterality: N/A;  Thoracic eleven vertebroplasty  . Ectopic pregnancy surgery    . Hemorrhoid surgery    . Cardiac catheterization      2010 Does not see a cardiac  doctor  . Dental surgery    . Kyphoplasty  09/03/2012    Procedure: KYPHOPLASTY;  Surgeon: Winfield Cunas, MD;  Location: [place] [facility];  Service: Neurosurgery;  Laterality: N/A;  Thoracic eight Kyphoplasty    SOCIAL HISTORY:   History  Substance Use Topics  . Smoking status: Never Smoker   . Smokeless tobacco: Never Used  . Alcohol Use: No    FAMILY HISTORY:   Family History  Problem Relation Age of Onset  . Anesthesia problems Neg Hx   . Heart disease Father     myocardial infarction  . Heart disease Brother     myocardial infarction  . Lymphoma Sister   . Breast cancer Neg Hx   . Colon cancer Neg Hx  DRUG ALLERGIES:   Allergies  Allergen Reactions  . Cefuroxime Axetil Rash    Pt states that she does fine with Keflex.    . Doxycycline Nausea Only and Other (See Comments)    Reaction:  Weight loss   . Advil [Ibuprofen] Swelling  . Celebrex [Celecoxib] Nausea Only  . Daypro [Oxaprozin] Other (See Comments)    Reaction:  Dizziness   . Etodolac Nausea Only  . Macrobid [employer] Macro] Other (See Comments)    Reaction:  Burning   . Penicillins Other (See Comments)    Reaction:  Burning   . Percocet [Oxycodone-Acetaminophen] Nausea Only and Swelling  . Tramadol Other (See Comments)    Reaction:  Unknown   . Valium [Diazepam] Other (See Comments)    Reaction:  Dizziness    . Neosporin [Neomycin-Bacitracin Zn-Polymyx] Rash  . Vicodin [Hydrocodone-Acetaminophen] Nausea Only    REVIEW OF SYSTEMS:   Review of Systems  Constitutional: Negative for fever, chills and weight loss.  Eyes: Negative for blurred vision.   Respiratory: Negative for cough, sputum production, shortness of breath and wheezing.   Cardiovascular: Negative for chest pain, palpitations, orthopnea, claudication and PND.  Gastrointestinal: Negative for heartburn, nausea, vomiting, abdominal pain, diarrhea, constipation and blood in stool.  Genitourinary: Negative for dysuria and frequency.  Musculoskeletal: Negative for myalgias, back pain and neck pain.  Skin: Positive for itching and rash.  Neurological: Negative for dizziness, sensory change, focal weakness, seizures, loss of consciousness, weakness and headaches.  Endo/Heme/Allergies: Does not bruise/bleed easily.  Psychiatric/Behavioral: Negative for depression.    MEDICATIONS AT HOME:   Prior to Admission medications   Medication Sig Start Date End Date Taking? Authorizing Provider  acetaminophen (TYLENOL) 650 MG CR tablet Take 650 mg by mouth every 8 (eight) hours as needed for pain.    Yes Historical Provider, MD  bifidobacterium infantis (ALIGN) capsule Take 1 capsule by mouth daily. 05/22/14  Yes [name], MD  Calcium Carb-Cholecalciferol (CALCIUM 600 + D PO) Take 1 tablet by mouth 3 (three) times daily.   Yes Historical Provider, MD  furosemide (LASIX) 20 MG tablet Take 1 tablet (20 mg total) by mouth daily. Patient taking differently: Take 20 mg by mouth daily.  07/19/14  Yes [name], MD  HYDROcodone-acetaminophen (NORCO/VICODIN) 5-325 MG per tablet Take 1 tablet by mouth daily.    Yes Historical Provider, MD  Misc Natural Products (OSTEO BI-FLEX JOINT SHIELD PO) Take 1 tablet by mouth 2 (two) times daily.   Yes Historical Provider, MD  omeprazole (PRILOSEC) 20 MG capsule Take 1 capsule by mouth  daily Patient taking differently: Take 20 mg by mouth daily.  12/25/14  Yes [name], MD  ramipril (ALTACE) 5 MG capsule Take 1 capsule by mouth  daily Patient taking differently: Take 5 mg by mouth daily.  12/25/14  Yes [name], MD  sertraline (ZOLOFT) 50 MG  tablet Take 1 tablet by mouth  daily Patient taking differently: Take 50 mg by mouth daily.  12/25/14  Yes [name], MD  sulfamethoxazole-trimethoprim (BACTRIM DS) 800-160 MG per tablet Take 1 tablet by mouth 2 (two) times daily. Patient not taking: Reported on 01/12/2015 07/20/14   [name], MD      VITAL SIGNS:  Blood pressure 145/56, pulse 80, temperature 97.7 F (36.5 C), temperature source Oral, resp. rate 18, height 5\' 1"  (1.549 m), weight 55.792 kg (123 lb), SpO2 100 %.  PHYSICAL EXAMINATION:  GENERAL:  81 y.o.-year-old patient lying in the bed with  no acute distress.  EYES: Pupils equal, round, reactive to light and accommodation. No scleral icterus. Extraocular muscles intact.  HEENT: Head atraumatic, normocephalic. Oropharynx and nasopharynx clear. Oral mucous membranes pink and moist, good dentition NECK:  Supple, no jugular venous distention. No thyroid enlargement, no tenderness.  LUNGS: Normal breath sounds bilaterally, no wheezing, rales,rhonchi or crepitation. No use of accessory muscles of respiration.  CARDIOVASCULAR: S1, S2 normal. No murmurs, rubs, or gallops.  ABDOMEN: Soft, nontender, nondistended. Bowel sounds present. No organomegaly or mass.  EXTREMITIES: 2+ edema over the left lower extremity trace edema over the right, no cyanosis or clubbing NEUROLOGIC: Cranial nerves II through XII are intact. Muscle strength 5/5 in all extremities. Sensation intact. Gait not checked.  PSYCHIATRIC: The patient is alert and oriented x 3.  SKIN:  erythema over the left foot ankle and up to the knee, several skin breaks no drainage, she also has erythema over both forearms which she states is chronic and unchanged, she also has erythema with skin flaking over the left jaw and some swelling over the left lower lip which she states is poison ivy  LABORATORY PANEL:   CBC  Recent Labs Lab 01/12/15 1228  WBC 5.7  HGB 10.9*  HCT 33.0*  PLT 183    ------------------------------------------------------------------------------------------------------------------  Chemistries   Recent Labs Lab 01/12/15 1228  NA 133*  K 4.2  CL 98*  CO2 26  GLUCOSE 102*  BUN 21*  CREATININE 1.04*  CALCIUM 9.1   ------------------------------------------------------------------------------------------------------------------  Cardiac Enzymes No results for input(s): TROPONINI in the last 168 hours. ------------------------------------------------------------------------------------------------------------------  RADIOLOGY:  Dg Tibia/fibula Left  01/12/2015   CLINICAL DATA:  [place] in November, chronic pain, hurts mostly in distal end of tib/fib. Swollen. No recurrent injury.  EXAM: LEFT TIBIA AND FIBULA - 2 VIEW  COMPARISON:  None.  FINDINGS: No fracture. No bone lesion. Bones are demineralized. Ankle and knee joints are normally aligned. There are dense vascular calcifications throughout the calf.  There is diffuse soft tissue edema.  IMPRESSION: 1. No fracture.  No bone lesion. 2. Diffuse soft tissue edema, nonspecific.   Electronically Signed   By: Lajean Manes M.D.   On: 01/12/2015 19:20   US Venous Img Lower Unilateral Left  01/12/2015   CLINICAL DATA:  Left leg swelling and redness below-the-knee for 3 weeks.  EXAM: LEFT LOWER EXTREMITY VENOUS DOPPLER ULTRASOUND  TECHNIQUE: Gray-scale sonography with graded compression, as well as color Doppler and duplex ultrasound were performed to evaluate the lower extremity deep venous systems from the level of the common femoral vein and including the common femoral, femoral, profunda femoral, popliteal and calf veins including the posterior tibial, peroneal and gastrocnemius veins when visible. The superficial great saphenous vein was also interrogated. Spectral Doppler was utilized to evaluate flow at rest and with distal augmentation maneuvers in the common femoral, femoral and popliteal veins.   COMPARISON:  None.  FINDINGS: Contralateral Common Femoral Vein: Respiratory phasicity is normal and symmetric with the symptomatic side. No evidence of thrombus. Normal compressibility.  Common Femoral Vein: No evidence of thrombus. Normal compressibility, respiratory phasicity and response to augmentation.  Saphenofemoral Junction: Patent without thrombus.  Profunda Femoral Vein: No evidence of thrombus. Normal compressibility and flow on color Doppler imaging.  Femoral Vein: There is a short segment of nonocclusive thrombus in the distal femoral vein. No other thrombus.  Popliteal Vein: No evidence of thrombus. Normal compressibility, respiratory phasicity and response to augmentation.  Calf Veins: Not visualized.  Thrombus not  excluded.  IMPRESSION: There is nonocclusive deep venous thrombosis in the distal left femoral vein. No thrombus proximal to this. Popliteal vein is patent. Calf veins are not visualized.   Electronically Signed   By: Lajean Manes M.D.   On: 01/12/2015 20:47    EKG:   Orders placed or performed in visit on 08/25/12  . EKG 12-Lead    IMPRESSION AND PLAN:   Active Problems:   Cellulitis  Number 1 left lower extremity cellulitis: Erythema edema and pain over the left lower extremity. She is unable to bear weight on the leg at this time. Ultrasound is negative for DVT. X-ray negative for bony involvement. Blood cultures are pending. Once cellulitis and pain improves she will need to continue following up with the wound care center. She had been on Bactrim prior to this admission. I do not have culture results from her previous skin infections.  #2 gastroesophageal reflux disease: Continue Protonix  3 anemia: Hemoglobin stable from prior values  #4 chronic kidney disease stage III: Creatinine fairly stable        All the records are reviewed and case discussed with ED provider. Management plans discussed with the patient, family and they are in agreement.  CODE  STATUS: Full   TOTAL TIME TAKING CARE OF THIS PATIENT: 45 minutes.    Myrtis Ser M.D on 01/12/2015 at 10:08 PM  Between 7am to 6pm - Pager - [phone]  After 6pm go to [email or web address] - password [email or web address] [facility]  Office  [phone]  CC: Primary care physician; [name], MD

## 2015-01-12 NOTE — ED Provider Notes (Signed)
[facility] Emergency Department Provider Note  ____________________________________________  Time seen: 5:15 PM  I have reviewed the triage vital signs and the nursing notes.   HISTORY  Chief Complaint Leg Swelling    HPI Kayla Galloway is a 81 y.o. female complains of swelling redness and pain to the left lower extremity for the past 3-4 weeks after a fall that caused abrasions to the left shin area. She does not have any fevers chills chest pain shortness of breath syncope urinary symptoms abdominal pain vomiting or diarrhea. She does eat and drink okay although she reports decreased appetite recently. She has severe pain in the left leg.The patient is sharp nonradiating course with movement. The patient feels too weak to stand.     Past Medical History  Diagnosis Date  . Arthritis   . Osteoporosis   . Hypercholesterolemia   . Anemia   . GERD (gastroesophageal reflux disease)   . DVT (deep venous thrombosis), left     bilateral, IVC filter 2010  . Depression   . Gout   . Nephrolithiasis   . 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 Renal vein thrombosis     previous renal insufficiency  . Hypertension     Dr. [name]  . Cancer     skin ca lesion of scalp- removed  . Peripheral vascular disease   . Spider veins     Patient Active Problem List   Diagnosis Date Noted  . Laceration of left leg 07/24/2014  . Laceration of left upper extremity 07/24/2014  . Weight loss 04/11/2014  . Urinary tract infectious disease 04/06/2014  . Rash, drug 07/18/2013  . Stasis ulcer 07/07/2013  . Anemia 06/28/2012  . Hypertension 06/28/2012  . Hypercholesterolemia 06/28/2012  . Renal insufficiency 06/28/2012  . Osteoporosis 06/28/2012    Past Surgical History  Procedure Laterality Date  . Fracture surgery  2009    hip  . Right oophorectomy      partial-  . Eye surgery      bil eyes  . Colonsopy    .  Tonsillectomy      age 81  . Skin cancer excision      top of head  . Ivc filter      pt states it has turned side ways and could not be removed.  . Vertebroplasty  12/31/2011    Procedure: VERTEBROPLASTY;  Surgeon: Winfield Cunas, MD;  Location: [place] [facility];  Service: Neurosurgery;  Laterality: N/A;  Thoracic eleven vertebroplasty  . Ectopic pregnancy surgery    . Hemorrhoid surgery    . Cardiac catheterization      2010 Does not see a cardiac  doctor  . Dental surgery    . Kyphoplasty  09/03/2012    Procedure: KYPHOPLASTY;  Surgeon: Winfield Cunas, MD;  Location: [place] [facility];  Service: Neurosurgery;  Laterality: N/A;  Thoracic eight Kyphoplasty    Current Outpatient Rx  Name  Route  Sig  Dispense  Refill  . acetaminophen (TYLENOL) 650 MG CR tablet   Oral   Take 650 mg by mouth every 8 (eight) hours as needed for pain.          . bifidobacterium infantis (ALIGN) capsule   Oral   Take 1 capsule by mouth daily.   30 capsule   0     Take probiotic while on antibiotic   . Calcium Carb-Cholecalciferol (CALCIUM 600 +  D PO)   Oral   Take 1 tablet by mouth 3 (three) times daily.         . furosemide (LASIX) 20 MG tablet      Take 1 tablet (20 mg total) by mouth daily. Patient taking differently: Take 20 mg by mouth daily.    30 tablet   5   . HYDROcodone-acetaminophen (NORCO/VICODIN) 5-325 MG per tablet   Oral   Take 1 tablet by mouth daily.          . Misc Natural Products (OSTEO BI-FLEX JOINT SHIELD PO)   Oral   Take 1 tablet by mouth 2 (two) times daily.         Marland Kitchen omeprazole (PRILOSEC) 20 MG capsule      Take 1 capsule by mouth  daily Patient taking differently: Take 20 mg by mouth daily.    90 capsule   0   . ramipril (ALTACE) 5 MG capsule      Take 1 capsule by mouth  daily Patient taking differently: Take 5 mg by mouth daily.    90 capsule   0   . sertraline (ZOLOFT) 50 MG tablet      Take 1 tablet by mouth  daily Patient taking differently:  Take 50 mg by mouth daily.    90 tablet   0   . sulfamethoxazole-trimethoprim (BACTRIM DS) 800-160 MG per tablet   Oral   Take 1 tablet by mouth 2 (two) times daily. Patient not taking: Reported on 01/12/2015   14 tablet   0     Allergies Cefuroxime axetil; Doxycycline; Advil; Celebrex; Daypro; Etodolac; Macrobid; Penicillins; Percocet; Tramadol; Valium; Neosporin; and Vicodin  Family History  Problem Relation Age of Onset  . Anesthesia problems Neg Hx   . Heart disease Father     myocardial infarction  . Heart disease Brother     myocardial infarction  . Lymphoma Sister   . Breast cancer Neg Hx   . Colon cancer Neg Hx     Social History History  Substance Use Topics  . Smoking status: Never Smoker   . Smokeless tobacco: Never Used  . Alcohol Use: No    Review of Systems  Constitutional: No fever or chills. No weight changes. Generalized weakness Eyes:No blurry vision or double vision.  ENT: No sore throat. Cardiovascular: No chest pain. Respiratory: No dyspnea or cough. Gastrointestinal: Negative for abdominal pain, vomiting and diarrhea.  No BRBPR or melena. Genitourinary: Negative for dysuria, urinary retention, bloody urine, or difficulty urinating. Musculoskeletal: Left leg pain and redness swelling Skin: Negative for rash. Neurological: Negative for headaches, focal weakness or numbness. Psychiatric:No anxiety or depression.   Endocrine:No hot/cold intolerance, changes in energy, or sleep difficulty.  10-point ROS otherwise negative.  ____________________________________________   PHYSICAL EXAM:  VITAL SIGNS: ED Triage Vitals  Enc Vitals Group     BP 01/12/15 1219 166/57 mmHg     Pulse Rate 01/12/15 1219 74     Resp 01/12/15 1219 18     Temp 01/12/15 1221 98.2 F (36.8 C)     Temp Source 01/12/15 1221 Tympanic     SpO2 01/12/15 1219 100 %     Weight 01/12/15 1219 123 lb (55.792 kg)     Height 01/12/15 1219 5\' 1"  (1.549 m)     Head Cir --       Peak Flow --      Pain Score 01/12/15 1220 7     Pain Loc --  Pain Edu? --      Excl. in [place]? --      Constitutional: Alert and oriented. Well appearing and in no distress. Eyes: No scleral icterus. No conjunctival pallor. PERRL. EOMI ENT   Head: Normocephalic and atraumatic.   Nose: No congestion/rhinnorhea. No septal hematoma   Mouth/Throat: MMM, no pharyngeal erythema. No peritonsillar mass. No uvula shift.   Neck: No stridor. No SubQ emphysema. No meningismus. Hematological/Lymphatic/Immunilogical: No cervical lymphadenopathy. Cardiovascular: RRR. Normal and symmetric distal pulses are present in all extremities. No murmurs, rubs, or gallops. Respiratory: Normal respiratory effort without tachypnea nor retractions. Breath sounds are clear and equal bilaterally. No wheezes/rales/rhonchi. Gastrointestinal: Soft and nontender. No distention. There is no CVA tenderness.  No rebound, rigidity, or guarding. Genitourinary: deferred Musculoskeletal: Left lower leg with diffuse erythema from the distal foot to just below the knee. Hot to touch, swollen, with a few superficial skin defects. There is a proximal May 2 x 5 cm area of the anterolateral shin where the skin remains intact but the underlying soft tissue feels boggy and is exquisitely tender.  Neurologic:   Normal speech and language.  CN 2-10 normal. Motor grossly intact. No pronator drift.  Normal gait. No gross focal neurologic deficits are appreciated.  Skin:  Skin is warm, dry and intact. No rash noted.  No petechiae, purpura, or bullae. Psychiatric: Mood and affect are normal. Speech and behavior are normal. Patient exhibits appropriate insight and judgment.  ____________________________________________    LABS (pertinent positives/negatives) (all labs ordered are listed, but only abnormal results are displayed) Labs Reviewed  CBC - Abnormal; Notable for the following:    RBC 3.38 (*)    Hemoglobin  10.9 (*)    HCT 33.0 (*)    All other components within normal limits  BASIC METABOLIC PANEL - Abnormal; Notable for the following:    Sodium 133 (*)    Chloride 98 (*)    Glucose, Bld 102 (*)    BUN 21 (*)    Creatinine, Ser 1.04 (*)    GFR calc non Af Amer 46 (*)    GFR calc Af Amer 53 (*)    All other components within normal limits   ____________________________________________   EKG    ____________________________________________    RADIOLOGY  X-ray left leg unremarkable  ____________________________________________   PROCEDURES  ____________________________________________   INITIAL IMPRESSION / ASSESSMENT AND PLAN / ED COURSE  Pertinent labs & imaging results that were available during my care of the patient were reviewed by me and considered in my medical decision making (see chart for details).  Patient presents with cellulitis to the left lower extremity arising from skin wounds from 3-4 weeks ago. She is having an acute decline in functional status due to generalized weakness in the setting of this acute illness. No sepsis right now low suspicion of necrotizing fasciitis or abscess or DVT PE. I will get an x-ray to evaluate for subcutaneous air even though there is no crepitus, and get an ultrasound to evaluate for DVT. Plan to admit to the hospital for treatment of cellulitis due to age and comorbidities. I'll give her IV fluids and IV vancomycin in the meantime.  ----------------------------------------- 8:46 PM on 01/12/2015 -----------------------------------------  Remains hemodynamically stable. Discussed with hospitalist team for admission for cellulitis.  ____________________________________________   FINAL CLINICAL IMPRESSION(S) / ED DIAGNOSES  Final diagnoses:  Leg pain, anterior, left  Cellulitis of left leg      Carrie Mew, MD 01/12/15 2046

## 2015-01-12 NOTE — Progress Notes (Signed)
Notified Dr Volanda Napoleon by phone of CBC for heparin baseline ordered to be drawn now. Can we use results from noon? MD verbalized ok to use CBC results from noon.

## 2015-01-13 LAB — BASIC METABOLIC PANEL
Anion gap: 6 (ref 5–15)
BUN: 22 mg/dL — AB (ref 6–20)
CO2: 27 mmol/L (ref 22–32)
Calcium: 8.3 mg/dL — ABNORMAL LOW (ref 8.9–10.3)
Chloride: 104 mmol/L (ref 101–111)
Creatinine, Ser: 0.91 mg/dL (ref 0.44–1.00)
GFR calc Af Amer: >60 mL/min (ref >60)
GFR, EST NON AFRICAN AMERICAN: 54 mL/min — AB (ref >60)
Glucose, Bld: 90 mg/dL (ref 65–99)
Potassium: 3.8 mmol/L (ref 3.5–5.1)
Sodium: 137 mmol/L (ref 135–145)

## 2015-01-13 LAB — CBC
HEMATOCRIT: 27.1 % — AB (ref 35.0–47.0)
Hemoglobin: 9.2 g/dL — ABNORMAL LOW (ref 12.0–16.0)
MCH: 32.9 pg (ref 26.0–34.0)
MCHC: 34 g/dL (ref 32.0–36.0)
MCV: 96.6 fL (ref 80.0–100.0)
Platelets: 162 10*3/uL (ref 150–440)
RBC: 2.81 MIL/uL — AB (ref 3.80–5.20)
RDW: 14.8 % — ABNORMAL HIGH (ref 11.5–14.5)
WBC: 5 10*3/uL (ref 3.6–11.0)

## 2015-01-13 MED ORDER — FUROSEMIDE 20 MG PO TABS
20.0000 mg | ORAL_TABLET | Freq: Every day | ORAL | Status: DC
  Administered 2015-01-13 – 2015-01-15 (×3): 20 mg via ORAL
  Filled 2015-01-13 (×3): qty 1

## 2015-01-13 MED ORDER — VANCOMYCIN HCL 500 MG IV SOLR
500.0000 mg | INTRAVENOUS | Status: DC
  Administered 2015-01-13 – 2015-01-14 (×2): 500 mg via INTRAVENOUS
  Filled 2015-01-13 (×3): qty 500

## 2015-01-13 MED ORDER — DIPHENHYDRAMINE HCL 25 MG PO CAPS
25.0000 mg | ORAL_CAPSULE | Freq: Every evening | ORAL | Status: DC | PRN
  Administered 2015-01-13: 22:00:00 25 mg via ORAL
  Filled 2015-01-13: qty 1

## 2015-01-13 MED ORDER — CALCIUM CARBONATE-VITAMIN D 500-200 MG-UNIT PO TABS
1.0000 | ORAL_TABLET | Freq: Two times a day (BID) | ORAL | Status: DC
  Administered 2015-01-13 – 2015-01-15 (×5): 1 via ORAL
  Filled 2015-01-13 (×9): qty 1

## 2015-01-13 MED ORDER — RAMIPRIL 5 MG PO CAPS
5.0000 mg | ORAL_CAPSULE | Freq: Every day | ORAL | Status: DC
  Administered 2015-01-13 – 2015-01-15 (×3): 5 mg via ORAL
  Filled 2015-01-13 (×3): qty 1

## 2015-01-13 NOTE — Plan of Care (Signed)
Problem: Discharge Progression Outcomes Goal: Discharge plan in place and appropriate Individualization: Pt lives at home alone. Pt's sister is her neighbor and assist pt as needed. Pt is a high fall risk. Offer toileting qx1hr with safety checks. 1xassist to the bathroom. Pt uses at home a walker and a cane.  H/O GERD, depression controlled with medication. CKD- monitor creatinine. H/O HTN, arthritis, hypercholesterolemia, gout- no meds at this time.

## 2015-01-13 NOTE — Evaluation (Signed)
Physical Therapy Evaluation Patient Details Name: LAURIEANN FRIDDLE MRN: [id number] DOB: [birth year] Today's Date: 01/13/2015   History of Present Illness  Ansley Mangiapane is a 81 y.o. female with a known history of hypertension, hyperlipidemia and gout left leg injury in November 2015 who had been followed by the wound Center for nonhealing wounds until 12/21/2014 presents today with worsening swelling, erythema, pain in the left leg.  Clinical Impression  81 yo Pleasant female reports increased swelling/redness in LLE; Patient was living at home alone and was independent in all ADLs prior to admittance. Currently she is mod I for bed mobility and transfers and ambulates mod I with RW and supervision without AD demonstrating reciprocal gait, slower gait speed; She reports functioning at baseline. She also reports doing exercises at home and was able to demonstrate independence and safety with standing exercise including squats/hip abd/heel raises while holding onto sink. She is currently functioning well at baseline.She does not demonstrate any additional need for skilled PT intervention.    Follow Up Recommendations No PT follow up    Equipment Recommendations       Recommendations for Other Services       Precautions / Restrictions Precautions Precautions: Fall      Mobility  Bed Mobility Overal bed mobility: Independent             General bed mobility comments: no rails and flat surface for supine to sit;   Transfers Overall transfer level: Modified independent Equipment used: Rolling walker (2 wheeled)             General transfer comment: transfers sit<>Stand with and without AD, using HHA to push up, modified independent demonstrating good safety awareness  Ambulation/Gait Ambulation/Gait assistance: Modified independent (Device/Increase time) Ambulation Distance (Feet): 160 Feet Assistive device: Rolling walker (2 wheeled)       General Gait Details: Able  to ambulate without AD, distant supervision for short distances; Mod I with RW, demonstrates short shuffled step, but reciprocal gait pattern; narrow base of support; reports walking speed/ability is at baseline  Stairs            Wheelchair Mobility    Modified Rankin (Stroke Patients Only)       Balance Overall balance assessment: Modified Independent                                           Pertinent Vitals/Pain Pain Assessment: 0-10 Pain Score: 5  Pain Location: in LLE Pain Descriptors / Indicators: Burning Pain Intervention(s): Repositioned (RN Notified)    Home Living Family/patient expects to be discharged to:: Private residence Living Arrangements: Alone Available Help at Discharge: Family (sister lives next door) Type of Home: House Home Access: Stairs to enter Entrance Stairs-Rails: Right Entrance Stairs-Number of Steps: 2 steps in back with 1 rail Home Layout: One level Home Equipment: Walker - 2 wheels;Cane - single point      Prior Function Level of Independence: Independent         Comments: Was walking in home without AD; independent in all ADLs; would use cane/walker when walking outside of home     Hand Dominance   Dominant Hand: Left    Extremity/Trunk Assessment   Upper Extremity Assessment: Overall WFL for tasks assessed           Lower Extremity Assessment: Overall WFL for tasks assessed (RLE is WNL; LLE  lower leg not tested due to swelling but appears WFL with stand tasks)      Cervical / Trunk Assessment: Kyphotic  Communication   Communication: No difficulties  Cognition Arousal/Alertness: Awake/alert Behavior During Therapy: WFL for tasks assessed/performed Overall Cognitive Status: Within Functional Limits for tasks assessed                      General Comments      Exercises Other Exercises Other Exercises: Patient independent in demonstrating HEP: mini squats, hip abduction and heel  raises x10 bilaterally holding onto sink;       Assessment/Plan    PT Assessment Patent does not need any further PT services  PT Diagnosis Generalized weakness   PT Problem List    PT Treatment Interventions     PT Goals (Current goals can be found in the Care Plan section) Acute Rehab PT Goals Patient Stated Goal: to go home soon PT Goal Formulation: With patient Time For Goal Achievement: 01/13/15 Potential to Achieve Goals: Good    Frequency     Barriers to discharge        Co-evaluation               End of Session Equipment Utilized During Treatment: Gait belt Activity Tolerance: Patient tolerated treatment well Patient left: in chair;with call bell/phone within reach;with chair alarm set Nurse Communication: Mobility status         Time: 1555-1616 PT Time Calculation (min) (ACUTE ONLY): 21 min   Charges:   PT Evaluation $Initial PT Evaluation Tier I: 1 Procedure     PT G Codes:        Hopkins,Margaret, PT, DPT 01/13/2015, 4:27 PM

## 2015-01-13 NOTE — Progress Notes (Signed)
ANTIBIOTIC CONSULT NOTE - INITIAL  Pharmacy Consult for vancomycin Indication: cellulitis LLE  Allergies  Allergen Reactions  . Cefuroxime Axetil Rash    Pt states that she does fine with Keflex.    . Doxycycline Nausea Only and Other (See Comments)    Reaction:  Weight loss   . Advil [Ibuprofen] Swelling  . Celebrex [Celecoxib] Nausea Only  . Daypro [Oxaprozin] Other (See Comments)    Reaction:  Dizziness   . Etodolac Nausea Only  . Macrobid [employer] Macro] Other (See Comments)    Reaction:  Burning   . Penicillins Other (See Comments)    Reaction:  Burning   . Percocet [Oxycodone-Acetaminophen] Nausea Only and Swelling  . Tramadol Other (See Comments)    Reaction:  Unknown   . Valium [Diazepam] Other (See Comments)    Reaction:  Dizziness    . Neosporin [Neomycin-Bacitracin Zn-Polymyx] Rash  . Vicodin [Hydrocodone-Acetaminophen] Nausea Only    Patient Measurements: Height: 5\' 1"  (154.9 cm) Weight: 123 lb (55.792 kg) IBW/kg (Calculated) : 47.8   Vital Signs: Temp: 98.7 F (37.1 C) (05/28 0909) Temp Source: Oral (05/28 0909) BP: 129/49 mmHg (05/28 0909) Pulse Rate: 79 (05/28 0909) Intake/Output from previous day: 05/27 0701 - 05/28 0700 In: -  Out: 400 [Urine:400] Intake/Output from this shift: Total I/O In: 240 [P.O.:240] Out: -   Labs:  Recent Labs  01/12/15 1228 01/13/15 0419  WBC 5.7 5.0  HGB 10.9* 9.2*  PLT 183 162  CREATININE 1.04* 0.91   Estimated Creatinine Clearance: 30.4 mL/min (by C-G formula based on Cr of 0.91). No results for input(s): VANCOTROUGH, VANCOPEAK, VANCORANDOM, GENTTROUGH, GENTPEAK, GENTRANDOM, TOBRATROUGH, TOBRAPEAK, TOBRARND, AMIKACINPEAK, AMIKACINTROU, AMIKACIN in the last 72 hours.   Microbiology: No results found for this or any previous visit (from the past 720 hour(s)).  Medical History: Past Medical History  Diagnosis Date  . Arthritis   . Osteoporosis   . Hypercholesterolemia   . Anemia   .  GERD (gastroesophageal reflux disease)   . DVT (deep venous thrombosis), left     bilateral, IVC filter 2010  . Depression   . Gout   . Nephrolithiasis   . 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 Renal vein thrombosis     previous renal insufficiency  . Hypertension     Dr. [name]  . Cancer     skin ca lesion of scalp- removed  . Peripheral vascular disease   . Spider veins     Medications:  Scheduled:  . calcium-vitamin D  1 tablet Oral BID  . furosemide  20 mg Oral Daily  . heparin  5,000 Units Subcutaneous 3 times per day  . pantoprazole  40 mg Oral Daily  . ramipril  5 mg Oral Daily  . sertraline  50 mg Oral Daily   Assessment: 81 yo female here with left lower extremity cellulitis Received vancomycin 1 g IV x1 in the ED 5/27 at 1823   Ke 0.030, half life 23.1 h, Vd 39.1 L BCx x2 pending  Goal of Therapy:  Vancomycin trough level 10-15 mcg/ml  Plan:  Will order vancomycin 500 mg IV q24h Will order trough prior to 4th dose - 5/30 at 1730 Will need to continue to monitor renal function and culture results    Rayna Sexton, PharmD, BCPS Clinical Pharmacist  01/13/2015,11:21 AM

## 2015-01-13 NOTE — Plan of Care (Signed)
Problem: Discharge Progression Outcomes Goal: Discharge plan in place and appropriate Individualization:  Outcome: Progressing Pt lives at home alone. Pt's sister is her neighbor and assist pt as needed. Pt is a high fall risk. Offer toileting qx1hr with safety checks. 1xassist to the bathroom. Pt uses at home a walker and a cane.   H/O GERD, depression controlled with medication. CKD- monitor creatinine. H/O HTN, arthritis, hypercholesterolemia, gout- no meds at this time. Goal: Other Discharge Outcomes/Goals Outcome: Progressing Plan of care Progress to goals:  1. Cellulitis- Afebrile. LLE red with +3 edema noted.       Tylenol 650mg  oral for LLE pain.      Remains on Vancomycin IV Abx. 2. Tolerating healthy heart diet. Great appetite.  3. Wound Consult ordered. 4. Ambulating down hallways with physical therapy. Tolerated well.

## 2015-01-13 NOTE — Progress Notes (Signed)
[place] [facility] NAME: Kayla Galloway    MR#:  [id number]  DATE OF BIRTH:  [birth year]  SUBJECTIVE:  CHIEF COMPLAINT:   Chief Complaint  Patient presents with  . Leg Swelling   Pt. Here w/ Left lower ext redness/pain/swelling likely due to cellulitis.   REVIEW OF SYSTEMS:    Review of Systems  Constitutional: Negative for fever and chills.  HENT: Negative for congestion and tinnitus.   Eyes: Negative for blurred vision and double vision.  Respiratory: Negative for cough, shortness of breath and wheezing.   Cardiovascular: Negative for chest pain, orthopnea and PND.  Gastrointestinal: Negative for nausea, vomiting, abdominal pain and diarrhea.  Genitourinary: Negative for dysuria and hematuria.  Skin: Positive for rash (cellulitic rash on LLE).  Neurological: Positive for weakness (generalized). Negative for dizziness, sensory change, focal weakness and headaches.  All other systems reviewed and are negative.  Nutrition: Regular Tolerating Diet: Yes  DRUG ALLERGIES:   Allergies  Allergen Reactions  . Cefuroxime Axetil Rash    Pt states that she does fine with Keflex.    . Doxycycline Nausea Only and Other (See Comments)    Reaction:  Weight loss   . Advil [Ibuprofen] Swelling  . Celebrex [Celecoxib] Nausea Only  . Daypro [Oxaprozin] Other (See Comments)    Reaction:  Dizziness   . Etodolac Nausea Only  . Macrobid [employer] Macro] Other (See Comments)    Reaction:  Burning   . Penicillins Other (See Comments)    Reaction:  Burning   . Percocet [Oxycodone-Acetaminophen] Nausea Only and Swelling  . Tramadol Other (See Comments)    Reaction:  Unknown   . Valium [Diazepam] Other (See Comments)    Reaction:  Dizziness    . Neosporin [Neomycin-Bacitracin Zn-Polymyx] Rash  . Vicodin [Hydrocodone-Acetaminophen] Nausea Only    VITALS:  Blood pressure 129/49, pulse 79, temperature 98.7 F (37.1 C),  temperature source Oral, resp. rate 18, height 5\' 1"  (1.549 m), weight 55.792 kg (123 lb), SpO2 99 %.  PHYSICAL EXAMINATION:   Physical Exam  GENERAL:  81 y.o.-year-old patient lying in the bed with no acute distress.  EYES: Pupils equal, round, reactive to light and accommodation. No scleral icterus. Extraocular muscles intact.  HEENT: Head atraumatic, normocephalic. Oropharynx and nasopharynx clear.  NECK:  Supple, no jugular venous distention. No thyroid enlargement, no tenderness.  LUNGS: Normal breath sounds bilaterally, no wheezing, rales, rhonchi. No use of accessory muscles of respiration.  CARDIOVASCULAR: S1, S2 normal. No murmurs, rubs, clicks, or gallops.  ABDOMEN: Soft, nontender, nondistended. Bowel sounds present. No organomegaly or mass.  EXTREMITIES: No cyanosis, clubbing or + 1-2 edema b/l.    NEUROLOGIC: Cranial nerves II through XII are intact. No focal Motor or sensory deficits b/l.   PSYCHIATRIC: The patient is alert and oriented x 3.  SKIN: b/l LE redness L>R and likely cellulitic rash on LLE, no lesion or ulcer.    LABORATORY PANEL:   CBC  Recent Labs Lab 01/13/15 0419  WBC 5.0  HGB 9.2*  HCT 27.1*  PLT 162   ------------------------------------------------------------------------------------------------------------------  Chemistries   Recent Labs Lab 01/13/15 0419  NA 137  K 3.8  CL 104  CO2 27  GLUCOSE 90  BUN 22*  CREATININE 0.91  CALCIUM 8.3*   ------------------------------------------------------------------------------------------------------------------  Cardiac Enzymes No results for input(s): TROPONINI in the last 168 hours. ------------------------------------------------------------------------------------------------------------------  RADIOLOGY:  Dg Tibia/fibula Left  01/12/2015   CLINICAL DATA:  [place]  in November, chronic pain, hurts mostly in distal end of tib/fib. Swollen. No recurrent injury.  EXAM: LEFT TIBIA AND FIBULA  - 2 VIEW  COMPARISON:  None.  FINDINGS: No fracture. No bone lesion. Bones are demineralized. Ankle and knee joints are normally aligned. There are dense vascular calcifications throughout the calf.  There is diffuse soft tissue edema.  IMPRESSION: 1. No fracture.  No bone lesion. 2. Diffuse soft tissue edema, nonspecific.   Electronically Signed   By: Lajean Manes M.D.   On: 01/12/2015 19:20   US Venous Img Lower Unilateral Left  01/12/2015   CLINICAL DATA:  Left leg swelling and redness below-the-knee for 3 weeks.  EXAM: LEFT LOWER EXTREMITY VENOUS DOPPLER ULTRASOUND  TECHNIQUE: Gray-scale sonography with graded compression, as well as color Doppler and duplex ultrasound were performed to evaluate the lower extremity deep venous systems from the level of the common femoral vein and including the common femoral, femoral, profunda femoral, popliteal and calf veins including the posterior tibial, peroneal and gastrocnemius veins when visible. The superficial great saphenous vein was also interrogated. Spectral Doppler was utilized to evaluate flow at rest and with distal augmentation maneuvers in the common femoral, femoral and popliteal veins.  COMPARISON:  None.  FINDINGS: Contralateral Common Femoral Vein: Respiratory phasicity is normal and symmetric with the symptomatic side. No evidence of thrombus. Normal compressibility.  Common Femoral Vein: No evidence of thrombus. Normal compressibility, respiratory phasicity and response to augmentation.  Saphenofemoral Junction: Patent without thrombus.  Profunda Femoral Vein: No evidence of thrombus. Normal compressibility and flow on color Doppler imaging.  Femoral Vein: There is a short segment of nonocclusive thrombus in the distal femoral vein. No other thrombus.  Popliteal Vein: No evidence of thrombus. Normal compressibility, respiratory phasicity and response to augmentation.  Calf Veins: Not visualized.  Thrombus not excluded.  IMPRESSION: There is  nonocclusive deep venous thrombosis in the distal left femoral vein. No thrombus proximal to this. Popliteal vein is patent. Calf veins are not visualized.   Electronically Signed   By: Lajean Manes M.D.   On: 01/12/2015 20:47     ASSESSMENT AND PLAN:   81 year old female with past medical history osteoporosis, hyperlipidemia, GERD, history of previous DVT, history of hypertension, skin cancer, peripheral vascular disease, who presented to the hospital due to left lower extremity redness and pain and noted to have a cellulitis.  #1 left lower extremity cellulitis-this is likely cause of patient's redness and swelling. Patient had ultrasound of the left lower extremity which was negative for DVT. Continue IV vancomycin for now. Patient is clinically afebrile, hemodynamically stable. Follow cultures. We'll get a wound care consult patient may benefit from an [place] boot  #2 hypertension-continue ramipril  #3 osteoporosis-continue calcium and vitamin D supplements.  #4 depression continue Zoloft  #5 GERD-continue Protonix.   All the records are reviewed and case discussed with Care Management/Social Workerr. Management plans discussed with the patient, family and they are in agreement.  CODE STATUS: Full  DVT Prophylaxis: Heparin subcutaneous  TOTAL TIME TAKING CARE OF THIS PATIENT: 30 minutes.   POSSIBLE D/C IN 1-2 DAYS, DEPENDING ON CLINICAL CONDITION.   Henreitta Leber M.D on 01/13/2015 at 9:31 AM  Between 7am to 6pm - Pager - [phone]  After 6pm go to [email or web address] - password [email or web address] [facility] [place]  Office  [phone]  CC: Primary care physician; [name], MD

## 2015-01-13 NOTE — Plan of Care (Signed)
Problem: Discharge Progression Outcomes Goal: Other Discharge Outcomes/Goals Outcome: Progressing Plan of care Progress to goals: Cellulitis- Afebrile. LLE red and swollen, 3+edema. C/o pain LLE, tylenol given per pt preference with good effect. Noticed skin tear upon admission on L lower leg-anterior, cleaned site and dressing applied. ABX. Ambulated to the bathroom with 1xassist.

## 2015-01-13 NOTE — Progress Notes (Signed)
Notified Dr Volanda Napoleon by phone that pt requested benadryl for itching. MD entered an order.

## 2015-01-14 MED ORDER — DIPHENHYDRAMINE HCL 12.5 MG/5ML PO ELIX
12.5000 mg | ORAL_SOLUTION | Freq: Four times a day (QID) | ORAL | Status: DC | PRN
  Administered 2015-01-14 – 2015-01-15 (×4): 12.5 mg via ORAL
  Filled 2015-01-14 (×5): qty 5

## 2015-01-14 MED ORDER — HYDROCERIN EX CREA
TOPICAL_CREAM | Freq: Every day | CUTANEOUS | Status: DC | PRN
  Administered 2015-01-14: 10:00:00 via TOPICAL
  Filled 2015-01-14: qty 113

## 2015-01-14 MED ORDER — AQUAPHOR EX OINT: TOPICAL_OINTMENT | Freq: Every day | CUTANEOUS | Status: DC | PRN

## 2015-01-14 MED ORDER — HYDROCERIN EX CREA
TOPICAL_CREAM | Freq: Two times a day (BID) | CUTANEOUS | Status: DC
  Administered 2015-01-14 – 2015-01-15 (×2): via TOPICAL
  Filled 2015-01-14: qty 113

## 2015-01-14 NOTE — Progress Notes (Addendum)
Notified Dr Tressia Miners that pt requested med for itching. 25mg  Benadryl PO given at bedtime with good effect. MD entered orders.

## 2015-01-14 NOTE — Progress Notes (Signed)
Patient up to chair for lunch with standby assist. Madlyn Frankel, RN

## 2015-01-14 NOTE — Care Management (Signed)
Per PT patient is at baseline and from home where she "was able to manage independently with walker". I attempted to contact patient in her room to discuss discharge planning but there was not answer. She appears to be on room air; PT states she is "moderate assistance" but not home health recommended. RNCM will continue to follow.

## 2015-01-14 NOTE — Consult Note (Signed)
WOC wound consult note Reason for Consult:LEs with erythema and left LE with edema. Also noted is bilateral UEs with erythema.  Patient is scratching UEs with long fingernails. Patient is amenable to the filing of her fingernails while here is that is an available care option by the nursing tech staff members. Wound type:infectious vs presentation of medication interaction or reaction vs venous insufficiency Pressure Ulcer POA: No Measurement:No open areas of significance. Wound QAS:UORV Drainage (amount, consistency, odor) Dried blood in scattered areas in both UEs and LEs Periwound:intact, erythematous and with edema on the left LE. Patient with history of traumatic injury within the last year to the left LE (scar is evident on the left lateral LE) Dressing procedure/placement/frequency: As the presentation is clearly not pure venous insufficiency, I am reluctant to wrap with a compression boot that will not be removed for several days.  I note that Eucerin cream has been ordered as an as needed medication; I will change this to twice daily to both the upper and lower extremities and add a wrapping (from metatarsal head to patellar notch with heel inclusive) to the LEs.  We will elevate the LEs while in bed or chair and float heels. Patient should follow up with PCP post discharge.  It may also help to provide [facility] services for the evaluation of these extremities at home as they will also be able to provide wraps should the erythema subside in the postacute period with orders from PCP. [place] nursing team will not follow, but will remain available to this patient, the nursing and medical teams  Please re-consult if needed. Thanks, Maudie Flakes, MSN, RN, [place], [place], [place] [phone])

## 2015-01-14 NOTE — Progress Notes (Signed)
[facility] [place] NAME: Kayla Galloway    MR#:  [id number]  DATE OF BIRTH:  1920-12-10  SUBJECTIVE:  CHIEF COMPLAINT:   Chief Complaint  Patient presents with  . Leg Swelling   Pt. Here w/ Left lower ext redness/pain/swelling likely due to cellulitis.  Pain in the left lower leg has improved.  No fever, chills.   REVIEW OF SYSTEMS:    Review of Systems  Constitutional: Negative for fever and chills.  HENT: Negative for congestion and tinnitus.   Eyes: Negative for blurred vision and double vision.  Respiratory: Negative for cough, shortness of breath and wheezing.   Cardiovascular: Negative for chest pain, orthopnea and PND.  Gastrointestinal: Negative for nausea, vomiting, abdominal pain and diarrhea.  Genitourinary: Negative for dysuria and hematuria.  Skin: Positive for rash (cellulitis rash on LLE).  Neurological: Negative for dizziness, sensory change and focal weakness.  All other systems reviewed and are negative.  Nutrition: Regular Tolerating Diet: Yes PT - will get PT eval today.   DRUG ALLERGIES:   Allergies  Allergen Reactions  . Cefuroxime Axetil Rash    Pt states that she does fine with Keflex.    . Doxycycline Nausea Only and Other (See Comments)    Reaction:  Weight loss   . Advil [Ibuprofen] Swelling  . Celebrex [Celecoxib] Nausea Only  . Daypro [Oxaprozin] Other (See Comments)    Reaction:  Dizziness   . Etodolac Nausea Only  . Macrobid [employer] Macro] Other (See Comments)    Reaction:  Burning   . Penicillins Other (See Comments)    Reaction:  Burning   . Percocet [Oxycodone-Acetaminophen] Nausea Only and Swelling  . Tramadol Other (See Comments)    Reaction:  Unknown   . Valium [Diazepam] Other (See Comments)    Reaction:  Dizziness    . Neosporin [Neomycin-Bacitracin Zn-Polymyx] Rash  . Vicodin [Hydrocodone-Acetaminophen] Nausea Only    VITALS:  Blood pressure 145/57,  pulse 71, temperature 97.7 F (36.5 C), temperature source Oral, resp. rate 16, height 5\' 1"  (1.549 m), weight 55.792 kg (123 lb), SpO2 98 %.  PHYSICAL EXAMINATION:   Physical Exam  GENERAL:  81 y.o.-year-old patient lying in the bed with no acute distress.  EYES: Pupils equal, round, reactive to light and accommodation. No scleral icterus. Extraocular muscles intact.  HEENT: Head atraumatic, normocephalic. Oropharynx and nasopharynx clear.  NECK:  Supple, no jugular venous distention. No thyroid enlargement, no tenderness.  LUNGS: Normal breath sounds bilaterally, no wheezing, rales, rhonchi. No use of accessory muscles of respiration.  CARDIOVASCULAR: S1, S2 normal. No murmurs, rubs, clicks, or gallops.  ABDOMEN: Soft, nontender, nondistended. Bowel sounds present. No organomegaly or mass.  EXTREMITIES: No cyanosis, clubbing or + 1-2 edema b/l.    NEUROLOGIC: Cranial nerves II through XII are intact. No focal Motor or sensory deficits b/l.   PSYCHIATRIC: The patient is alert and oriented x 3. Good affect.  SKIN: b/l LE redness L>R and likely cellulitic rash on LLE, no lesion or ulcer.    LABORATORY PANEL:   CBC  Recent Labs Lab 01/13/15 0419  WBC 5.0  HGB 9.2*  HCT 27.1*  PLT 162   ------------------------------------------------------------------------------------------------------------------  Chemistries   Recent Labs Lab 01/13/15 0419  NA 137  K 3.8  CL 104  CO2 27  GLUCOSE 90  BUN 22*  CREATININE 0.91  CALCIUM 8.3*   ------------------------------------------------------------------------------------------------------------------  Cardiac Enzymes No results for input(s): TROPONINI in the  last 168 hours. ------------------------------------------------------------------------------------------------------------------  RADIOLOGY:  Dg Tibia/fibula Left  01/12/2015   CLINICAL DATA:  [place] in November, chronic pain, hurts mostly in distal end of tib/fib.  Swollen. No recurrent injury.  EXAM: LEFT TIBIA AND FIBULA - 2 VIEW  COMPARISON:  None.  FINDINGS: No fracture. No bone lesion. Bones are demineralized. Ankle and knee joints are normally aligned. There are dense vascular calcifications throughout the calf.  There is diffuse soft tissue edema.  IMPRESSION: 1. No fracture.  No bone lesion. 2. Diffuse soft tissue edema, nonspecific.   Electronically Signed   By: Lajean Manes M.D.   On: 01/12/2015 19:20   US Venous Img Lower Unilateral Left  01/12/2015   CLINICAL DATA:  Left leg swelling and redness below-the-knee for 3 weeks.  EXAM: LEFT LOWER EXTREMITY VENOUS DOPPLER ULTRASOUND  TECHNIQUE: Gray-scale sonography with graded compression, as well as color Doppler and duplex ultrasound were performed to evaluate the lower extremity deep venous systems from the level of the common femoral vein and including the common femoral, femoral, profunda femoral, popliteal and calf veins including the posterior tibial, peroneal and gastrocnemius veins when visible. The superficial great saphenous vein was also interrogated. Spectral Doppler was utilized to evaluate flow at rest and with distal augmentation maneuvers in the common femoral, femoral and popliteal veins.  COMPARISON:  None.  FINDINGS: Contralateral Common Femoral Vein: Respiratory phasicity is normal and symmetric with the symptomatic side. No evidence of thrombus. Normal compressibility.  Common Femoral Vein: No evidence of thrombus. Normal compressibility, respiratory phasicity and response to augmentation.  Saphenofemoral Junction: Patent without thrombus.  Profunda Femoral Vein: No evidence of thrombus. Normal compressibility and flow on color Doppler imaging.  Femoral Vein: There is a short segment of nonocclusive thrombus in the distal femoral vein. No other thrombus.  Popliteal Vein: No evidence of thrombus. Normal compressibility, respiratory phasicity and response to augmentation.  Calf Veins: Not  visualized.  Thrombus not excluded.  IMPRESSION: There is nonocclusive deep venous thrombosis in the distal left femoral vein. No thrombus proximal to this. Popliteal vein is patent. Calf veins are not visualized.   Electronically Signed   By: Lajean Manes M.D.   On: 01/12/2015 20:47     ASSESSMENT AND PLAN:   81 year old female with past medical history osteoporosis, hyperlipidemia, GERD, history of previous DVT, history of hypertension, skin cancer, peripheral vascular disease, who presented to the hospital due to left lower extremity redness and pain and noted to have a cellulitis.  #1 left lower extremity cellulitis-this is likely cause of patient's redness and swelling. Patient had ultrasound of the left lower extremity which showed non-occlusive thrombus in the distal left femoral vein. Will need to discuss w/ vascular to see if this needs treatment.   Continue IV vancomycin for now. Patient is clinically afebrile, hemodynamically stable. Cultures so far (-).  Await wound care consult as patient may benefit from an Unna boot  #2 hypertension-continue ramipril  #3 osteoporosis-continue calcium and vitamin D supplements.  #4 depression continue Zoloft  #5 GERD-continue Protonix.   All the records are reviewed and case discussed with Care Management/Social Workerr. Management plans discussed with the patient, family and they are in agreement.  CODE STATUS: Full  DVT Prophylaxis: Heparin subcutaneous  TOTAL TIME TAKING CARE OF THIS PATIENT: 30 minutes.   POSSIBLE D/C tomorrow a.m. If clinically doing well. Await PT evalHenreitta Leber M.D on 01/14/2015 at 9:27 AM  Between 7am to 6pm - Pager -  [phone]  After 6pm go to [email or web address] - password [email or web address] [facility]  Office  [phone]  CC: Primary care physician; [name], MD

## 2015-01-14 NOTE — Plan of Care (Signed)
Problem: Discharge Progression Outcomes Goal: Discharge plan in place and appropriate Individualization:  Pt lives at home alone. Pt's sister is her neighbor and assist pt as needed. Pt is a high fall risk. Offer toileting qx1hr with safety checks. 1xassist to the bathroom. Pt uses at home a walker and a cane.    H/O GERD, depression controlled with medication. CKD- monitor creatinine. H/O HTN, arthritis, hypercholesterolemia, gout- no meds at this time. Goal: Other Discharge Outcomes/Goals Outcome: Progressing Plan of care progress to goals: Afebrile. LLE red and swollen, 3+edema. Dressing dry and intact on Skin tear-LLE. No drainage. No c/o pain. C/o itchingx1, MD notified, benadryl ordered and given with improvement. Ambulated with 1xassist to the bathroom, tolerated well.

## 2015-01-14 NOTE — Plan of Care (Signed)
Problem: Discharge Progression Outcomes Goal: Discharge plan in place and appropriate Individualization:  Individualization of Care:   Patient is from home alone, her sister is the neighbor and can assist patient when needed.  High Fall Risk - toileting assistance with hourly rounding. Patient uses walker at home.  PMH: GERD and depression controlled with medication. CKD - monitor creatinine. PMH: HTN, arthritis, hypercholesterolemia, gout - no medications at this time.    Goal: Other Discharge Outcomes/Goals Plan of care progress to goal:  Patient remains afebrile. Tylenol given for pain in LLE, relief noted. Patient c/o itching - Benadryl PRN given x2, minimal relief noted. Eucerin cream applied.

## 2015-01-15 MED ORDER — HYDROXYZINE HCL 25 MG PO TABS
12.5000 mg | ORAL_TABLET | Freq: Once | ORAL | Status: AC
  Administered 2015-01-15: 12.5 mg via ORAL
  Filled 2015-01-15: qty 1

## 2015-01-15 MED ORDER — HYDROCERIN EX CREA: 1.0000 "application " | TOPICAL_CREAM | Freq: Two times a day (BID) | CUTANEOUS | Status: DC

## 2015-01-15 MED ORDER — SULFAMETHOXAZOLE-TRIMETHOPRIM 800-160 MG PO TABS: 1.0000 | ORAL_TABLET | Freq: Two times a day (BID) | ORAL | Status: DC

## 2015-01-15 NOTE — Progress Notes (Signed)
Spoke to Dr Reece Levy regarding pt c/o itching. Benadryl is not due at this time. MD verbalized to order atarax 12.5mg  PO once.

## 2015-01-15 NOTE — Discharge Instructions (Signed)
°  DIET:  Cardiac diet  DISCHARGE CONDITION:  Stable  ACTIVITY:  Activity as tolerated  OXYGEN:  Home Oxygen: No.   Oxygen Delivery: room air  DISCHARGE LOCATION:  Home with home health RN.    If you experience worsening of your admission symptoms, develop shortness of breath, life threatening emergency, suicidal or homicidal thoughts you must seek medical attention immediately by calling 911 or calling your MD immediately  if symptoms less severe.  You Must read complete instructions/literature along with all the possible adverse reactions/side effects for all the Medicines you take and that have been prescribed to you. Take any new Medicines after you have completely understood and accpet all the possible adverse reactions/side effects.   Please note  You were cared for by a hospitalist during your hospital stay. If you have any questions about your discharge medications or the care you received while you were in the hospital after you are discharged, you can call the unit and asked to speak with the hospitalist on call if the hospitalist that took care of you is not available. Once you are discharged, your primary care physician will handle any further medical issues. Please note that NO REFILLS for any discharge medications will be authorized once you are discharged, as it is imperative that you return to your primary care physician (or establish a relationship with a primary care physician if you do not have one) for your aftercare needs so that they can reassess your need for medications and monitor your lab values.

## 2015-01-15 NOTE — Care Management (Signed)
Admitted to [facility] with the diagnosis of cellulitis. Lives alone. Sister is Barrie Lyme [phone]), Home Health about 5 years ago. Doesn't remember name of agency. [facility] about 5-6 years ago. Sees Dr. [name]. Next appointment scheduled for August 4th. Uses rolling walker and cane as needed for ambulation. Sister will transport today.  Spoke with Dr. Heron Sabins. Will need Home Health for services. Agreed with [facility] as agency. Will update [facility].  Discharge today per Dr. Heron Sabins. Shelbie Ammons RN MSN Care Management [phone]

## 2015-01-15 NOTE — Progress Notes (Signed)
Discharge instructions went over with patient and her sister at bedside. Prescriptions given and in hand. All questions answered. Patient will be transported by car to home with her sister. Escorted via wheelchair by nursing staff to visitors entrance.

## 2015-01-15 NOTE — Plan of Care (Signed)
Problem: Discharge Progression Outcomes Goal: Discharge plan in place and appropriate Individualization:  Patient is from home alone, her sister is the neighbor and can assist patient when needed.   High Fall Risk - toileting assistance with hourly rounding. Patient uses walker at home.   PMH: GERD and depression controlled with medication. CKD - monitor creatinine. PMH: HTN, arthritis, hypercholesterolemia, gout - controlled with meds.     Goal: Other Discharge Outcomes/Goals Outcome: Progressing Afebrile. LLE red and swollen, 3+edema. No drainage from LLE skin tear. Skin care performed and dressing applied to LLE per order. Tylenol given once for pain with improvement. Benadryl given for itching, pt was asleep on reassessment. Ambulated with 1xassist to the bathroom, tolerated well.

## 2015-01-15 NOTE — Progress Notes (Signed)
Provided Pastoral Care and prayer.   01/15/15 1000  Clinical Encounter Type  Visited With Patient (Pastoral Care and prayer proviided.)  Visit Type Initial  Spiritual Encounters  Spiritual Needs Prayer  Stress Factors  Patient Stress Factors None identified

## 2015-01-15 NOTE — Discharge Summary (Signed)
[facility] [place] NAME: Kayla Galloway    MR#:  [id number]  DATE OF BIRTH:  Mar 27, 1921  DATE OF ADMISSION:  01/12/2015 ADMITTING PHYSICIAN: Aldean Jewett, MD  DATE OF DISCHARGE: 01/15/2015 11:27 AM  PRIMARY CARE PHYSICIAN: [name], MD    ADMISSION DIAGNOSIS:  Cellulitis of left leg [Y65.035] Leg pain, anterior, left [M79.602, M79.605]  DISCHARGE DIAGNOSIS:  Active Problems:   Cellulitis   SECONDARY DIAGNOSIS:   Past Medical History  Diagnosis Date  . Arthritis   . Osteoporosis   . Hypercholesterolemia   . Anemia   . GERD (gastroesophageal reflux disease)   . DVT (deep venous thrombosis), left     bilateral, IVC filter 2010  . Depression   . Gout   . Nephrolithiasis   . 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 Renal vein thrombosis     previous renal insufficiency  . Hypertension     Dr. [name]  . Cancer     skin ca lesion of scalp- removed  . Peripheral vascular disease   . Spider veins     HOSPITAL COURSE:   81 year old female with past medical history of previous DVT, hypertension, skin cancer, PVD, osteoporosis, hyperlipidemia, chronic anemia, GERD, depression, gout who presented to the hospital due to left lower extremity redness and swelling and pain and noted to have a cellulitis.  #1 left lower extremity cellulitis-this was likely the cause of patient's redness and swelling. -Patient did undergo a Doppler of her lower extremity which showed a nonocclusive DVT. This was likely chronic for the patient. - Patient was admitted to the hospital and started on IV vancomycin, and wound care consult was also obtained.  After getting a few days of IV antibiotics are clinical symptoms have improved. She is currently afebrile and hemodynamically stable and her blood cultures are negative. -Patient is being discharged on oral Bactrim for additional 10 days,  she will also apply Eucerin cream to her lower and upper extremities and have home health nursing follow-up for wound care.  #2 hypertension-patient will continue her ramipril.  #3 osteoporosis-patient will continue her calcium vitamin D supplements.  #4 depression patient will continue her Zoloft.  #5 GERD-patient will continue Protonix.  Patient is being discharged home with home health nursing services for chronic wound care.  DISCHARGE CONDITIONS:   Stable  CONSULTS OBTAINED:     DRUG ALLERGIES:   Allergies  Allergen Reactions  . Cefuroxime Axetil Rash    Pt states that she does fine with Keflex.    . Doxycycline Nausea Only and Other (See Comments)    Reaction:  Weight loss   . Advil [Ibuprofen] Swelling  . Celebrex [Celecoxib] Nausea Only  . Daypro [Oxaprozin] Other (See Comments)    Reaction:  Dizziness   . Etodolac Nausea Only  . Macrobid [employer] Macro] Other (See Comments)    Reaction:  Burning   . Penicillins Other (See Comments)    Reaction:  Burning   . Percocet [Oxycodone-Acetaminophen] Nausea Only and Swelling  . Tramadol Other (See Comments)    Reaction:  Unknown   . Valium [Diazepam] Other (See Comments)    Reaction:  Dizziness    . Neosporin [Neomycin-Bacitracin Zn-Polymyx] Rash  . Vicodin [Hydrocodone-Acetaminophen] Nausea Only    DISCHARGE MEDICATIONS:   Discharge Medication List as of 01/15/2015 10:07 AM    START taking these medications   Details  hydrocerin (EUCERIN) CREA Apply 1 application topically 2 (two) times daily., Starting 01/15/2015, Until Discontinued, Print      CONTINUE these medications which have CHANGED   Details  sulfamethoxazole-trimethoprim (BACTRIM DS,SEPTRA DS) 800-160 MG per tablet Take 1 tablet by mouth 2 (two) times daily., Starting 01/15/2015, Until Discontinued, Print      CONTINUE these medications which have NOT CHANGED   Details  acetaminophen (TYLENOL) 650 MG CR tablet Take 650 mg by mouth  every 8 (eight) hours as needed for pain. , Until Discontinued, Historical Med    bifidobacterium infantis (ALIGN) capsule Take 1 capsule by mouth daily., Starting 05/22/2014, Until Discontinued, Normal    Calcium Carb-Cholecalciferol (CALCIUM 600 + D PO) Take 1 tablet by mouth 3 (three) times daily., Until Discontinued, Historical Med    furosemide (LASIX) 20 MG tablet Take 1 tablet (20 mg total) by mouth daily., Normal    HYDROcodone-acetaminophen (NORCO/VICODIN) 5-325 MG per tablet Take 1 tablet by mouth daily. , Until Discontinued, Historical Med    Misc Natural Products (OSTEO BI-FLEX JOINT SHIELD PO) Take 1 tablet by mouth 2 (two) times daily., Until Discontinued, Historical Med    omeprazole (PRILOSEC) 20 MG capsule Take 1 capsule by mouth  daily, Normal    ramipril (ALTACE) 5 MG capsule Take 1 capsule by mouth  daily, Normal    sertraline (ZOLOFT) 50 MG tablet Take 1 tablet by mouth  daily, Normal         DISCHARGE INSTRUCTIONS:   DIET:  Cardiac diet  DISCHARGE CONDITION:  Stable  ACTIVITY:  Activity as tolerated  OXYGEN:  Home Oxygen: No.   Oxygen Delivery: room air  DISCHARGE LOCATION:  Home with home health nursing   If you experience worsening of your admission symptoms, develop shortness of breath, life threatening emergency, suicidal or homicidal thoughts you must seek medical attention immediately by calling 911 or calling your MD immediately  if symptoms less severe.  You Must read complete instructions/literature along with all the possible adverse reactions/side effects for all the Medicines you take and that have been prescribed to you. Take any new Medicines after you have completely understood and accpet all the possible adverse reactions/side effects.   Please note  You were cared for by a hospitalist during your hospital stay. If you have any questions about your discharge medications or the care you received while you were in the hospital after  you are discharged, you can call the unit and asked to speak with the hospitalist on call if the hospitalist that took care of you is not available. Once you are discharged, your primary care physician will handle any further medical issues. Please note that NO REFILLS for any discharge medications will be authorized once you are discharged, as it is imperative that you return to your primary care physician (or establish a relationship with a primary care physician if you do not have one) for your aftercare needs so that they can reassess your need for medications and monitor your lab values.     Today    VITAL SIGNS:  Blood pressure 150/55, pulse 74, temperature 97.6 F (36.4 C), temperature source Oral, resp. rate 18, height 5\' 1"  (1.549 m), weight 55.792 kg (123 lb), SpO2 98 %.  I/O:   Intake/Output Summary (Last 24 hours) at 01/15/15 1206 Last data filed at 01/15/15 0900  Gross per 24 hour  Intake    580 ml  Output   1500 ml  Net   -920 ml  PHYSICAL EXAMINATION:  GENERAL:  81 y.o.-year-old patient lying in the bed with no acute distress.  EYES: Pupils equal, round, reactive to light and accommodation. No scleral icterus. Extraocular muscles intact.  HEENT: Head atraumatic, normocephalic. Oropharynx and nasopharynx clear.  NECK:  Supple, no jugular venous distention. No thyroid enlargement, no tenderness.  LUNGS: Normal breath sounds bilaterally, no wheezing, rales,rhonchi or crepitation. No use of accessory muscles of respiration.  CARDIOVASCULAR: S1, S2 normal. No murmurs, rubs, or gallops.  ABDOMEN: Soft, non-tender, non-distended. Bowel sounds present. No organomegaly or mass.  EXTREMITIES: No pedal edema, cyanosis, or clubbing.  NEUROLOGIC: Cranial nerves II through XII are intact. No focal motor or sensory defecits b/l.  PSYCHIATRIC: The patient is alert and oriented x 3.  SKIN: No obvious rash, lesion, or ulcer.   DATA REVIEW:   CBC  Recent Labs Lab 01/13/15 0419   WBC 5.0  HGB 9.2*  HCT 27.1*  PLT 162    Chemistries   Recent Labs Lab 01/13/15 0419  NA 137  K 3.8  CL 104  CO2 27  GLUCOSE 90  BUN 22*  CREATININE 0.91  CALCIUM 8.3*    Cardiac Enzymes No results for input(s): TROPONINI in the last 168 hours.  Microbiology Results  Results for orders placed or performed during the hospital encounter of 01/12/15  Culture, blood (routine x 2)     Status: None (Preliminary result)   Collection Time: 01/12/15 11:55 PM  Result Value Ref Range Status   Specimen Description BLOOD  Final   Special Requests NONE  Final   Culture NO GROWTH 2 DAYS  Final   Report Status PENDING  Incomplete  Culture, blood (routine x 2)     Status: None (Preliminary result)   Collection Time: 01/12/15 11:58 PM  Result Value Ref Range Status   Specimen Description BLOOD  Final   Special Requests NONE  Final   Culture NO GROWTH 2 DAYS  Final   Report Status PENDING  Incomplete    RADIOLOGY:  No results found.    Management plans discussed with the patient, family and they are in agreement.  CODE STATUS:     Code Status Orders        Start     Ordered   01/12/15 2248  Full code   Continuous     01/12/15 2248    Advance Directive Documentation        Most Recent Value   Type of Advance Directive  Living will, Healthcare Power of Attorney   Pre-existing out of facility DNR order (yellow form or pink MOST form)     "MOST" Form in Place?        TOTAL TIME TAKING CARE OF THIS PATIENT: 40 minutes.    Henreitta Leber M.D on 01/15/2015 at 12:06 PM  Between 7am to 6pm - Pager - [phone]  After 6pm go to [email or web address] - password [email or web address] [facility]  Office  [phone]  CC: Primary care physician; [name], MD

## 2015-01-16 ENCOUNTER — Telehealth: Payer: Self-pay | Admitting: *Deleted

## 2015-01-16 DIAGNOSIS — L03116 Cellulitis of left lower limb: Secondary | ICD-10-CM | POA: Diagnosis not present

## 2015-01-16 DIAGNOSIS — I739 Peripheral vascular disease, unspecified: Secondary | ICD-10-CM | POA: Diagnosis not present

## 2015-01-16 DIAGNOSIS — D649 Anemia, unspecified: Secondary | ICD-10-CM | POA: Diagnosis not present

## 2015-01-16 DIAGNOSIS — Z4800 Encounter for change or removal of nonsurgical wound dressing: Secondary | ICD-10-CM | POA: Diagnosis not present

## 2015-01-16 DIAGNOSIS — I10 Essential (primary) hypertension: Secondary | ICD-10-CM | POA: Diagnosis not present

## 2015-01-16 DIAGNOSIS — E785 Hyperlipidemia, unspecified: Secondary | ICD-10-CM | POA: Diagnosis not present

## 2015-01-16 DIAGNOSIS — L03115 Cellulitis of right lower limb: Secondary | ICD-10-CM | POA: Diagnosis not present

## 2015-01-16 DIAGNOSIS — F329 Major depressive disorder, single episode, unspecified: Secondary | ICD-10-CM | POA: Diagnosis not present

## 2015-01-16 DIAGNOSIS — M109 Gout, unspecified: Secondary | ICD-10-CM | POA: Diagnosis not present

## 2015-01-16 DIAGNOSIS — K219 Gastro-esophageal reflux disease without esophagitis: Secondary | ICD-10-CM | POA: Diagnosis not present

## 2015-01-16 NOTE — Telephone Encounter (Signed)
TCM call completed, appt scheduled

## 2015-01-16 NOTE — Telephone Encounter (Signed)
Left message for pt to return my call.

## 2015-01-16 NOTE — Telephone Encounter (Addendum)
Discharge Date:  01/15/15  Transition Care Management Follow-up Telephone Call  How have you been since you were released from the hospital? Pt states she is improving, cellulitis is improving. [facility] nurse was out today for wound care.    Do you understand why you were in the hospital? YES, left leg cellulitis   Do you understand the discharge instructions? YES, see meds below.   Items Reviewed:  Medications reviewed: YES, Bactrim x 10 days. Eucerin cream to affected area.   Allergies reviewed: YES, no new drug allergies  Dietary changes reviewed: YES, cardiac diet  Referrals reviewed: HH for wound care   Functional Questionnaire:   Activities of Daily Living (ADLs):  She states they are independent in the following:  All States they require assistance with the following: None   Any transportation issues/concerns?: NO, pt's sister will drive her to the appt   Any patient concerns? Not at this time, advised to call back with any questions or concerns prior to the appt.    Confirmed importance and date/time of follow-up visits scheduled: YES, appt scheduled with Dr. Nicki Reaper 01/24/15 @ 11:30.    Confirmed with patient if condition begins to worsen call PCP or go to the ER. Patient was given the Call-a-Nurse line [phone]: YES

## 2015-01-16 NOTE — Telephone Encounter (Signed)
The patient's caregiver called and is hoping to make the hospital follow up appointment.   I offered her an apt with C.Doss, however, she stated the patient would only see Dr.Peyser.  What day and what time do you want this patient worked in?  Thanks!

## 2015-01-17 DIAGNOSIS — Z961 Presence of intraocular lens: Secondary | ICD-10-CM | POA: Diagnosis not present

## 2015-01-18 ENCOUNTER — Telehealth: Payer: Self-pay

## 2015-01-18 ENCOUNTER — Telehealth: Payer: Self-pay | Admitting: Internal Medicine

## 2015-01-18 DIAGNOSIS — I739 Peripheral vascular disease, unspecified: Secondary | ICD-10-CM | POA: Diagnosis not present

## 2015-01-18 DIAGNOSIS — D649 Anemia, unspecified: Secondary | ICD-10-CM | POA: Diagnosis not present

## 2015-01-18 DIAGNOSIS — K219 Gastro-esophageal reflux disease without esophagitis: Secondary | ICD-10-CM | POA: Diagnosis not present

## 2015-01-18 DIAGNOSIS — I10 Essential (primary) hypertension: Secondary | ICD-10-CM | POA: Diagnosis not present

## 2015-01-18 DIAGNOSIS — Z4800 Encounter for change or removal of nonsurgical wound dressing: Secondary | ICD-10-CM | POA: Diagnosis not present

## 2015-01-18 DIAGNOSIS — F329 Major depressive disorder, single episode, unspecified: Secondary | ICD-10-CM | POA: Diagnosis not present

## 2015-01-18 DIAGNOSIS — M109 Gout, unspecified: Secondary | ICD-10-CM | POA: Diagnosis not present

## 2015-01-18 DIAGNOSIS — L03116 Cellulitis of left lower limb: Secondary | ICD-10-CM | POA: Diagnosis not present

## 2015-01-18 DIAGNOSIS — E785 Hyperlipidemia, unspecified: Secondary | ICD-10-CM | POA: Diagnosis not present

## 2015-01-18 DIAGNOSIS — L03115 Cellulitis of right lower limb: Secondary | ICD-10-CM | POA: Diagnosis not present

## 2015-01-18 LAB — CULTURE, BLOOD (ROUTINE X 2)
Culture: NO GROWTH
Culture: NO GROWTH

## 2015-01-18 NOTE — Telephone Encounter (Signed)
She was recently admitted with infection.  If leg weeping, needs to be evaluated asap.

## 2015-01-18 NOTE — Telephone Encounter (Signed)
Pt

## 2015-01-18 NOTE — Telephone Encounter (Signed)
Amy notified

## 2015-01-18 NOTE — Telephone Encounter (Signed)
The home health RN called and is hoping to get advice on what to do regarding the patient's leg weeping.  Name - Amy - callback [phone]

## 2015-01-23 DIAGNOSIS — Z4800 Encounter for change or removal of nonsurgical wound dressing: Secondary | ICD-10-CM | POA: Diagnosis not present

## 2015-01-23 DIAGNOSIS — K219 Gastro-esophageal reflux disease without esophagitis: Secondary | ICD-10-CM | POA: Diagnosis not present

## 2015-01-23 DIAGNOSIS — I739 Peripheral vascular disease, unspecified: Secondary | ICD-10-CM | POA: Diagnosis not present

## 2015-01-23 DIAGNOSIS — D649 Anemia, unspecified: Secondary | ICD-10-CM | POA: Diagnosis not present

## 2015-01-23 DIAGNOSIS — L03116 Cellulitis of left lower limb: Secondary | ICD-10-CM | POA: Diagnosis not present

## 2015-01-23 DIAGNOSIS — I10 Essential (primary) hypertension: Secondary | ICD-10-CM | POA: Diagnosis not present

## 2015-01-23 DIAGNOSIS — M109 Gout, unspecified: Secondary | ICD-10-CM | POA: Diagnosis not present

## 2015-01-23 DIAGNOSIS — E785 Hyperlipidemia, unspecified: Secondary | ICD-10-CM | POA: Diagnosis not present

## 2015-01-23 DIAGNOSIS — L03115 Cellulitis of right lower limb: Secondary | ICD-10-CM | POA: Diagnosis not present

## 2015-01-23 DIAGNOSIS — F329 Major depressive disorder, single episode, unspecified: Secondary | ICD-10-CM | POA: Diagnosis not present

## 2015-01-24 ENCOUNTER — Inpatient Hospital Stay
Admission: AD | Admit: 2015-01-24 | Discharge: 2015-01-26 | DRG: 603 | Disposition: A | Discharge status: Home / Self Care | Payer: Medicare Other | Source: Ambulatory Visit | Attending: Internal Medicine | Admitting: Internal Medicine

## 2015-01-24 ENCOUNTER — Ambulatory Visit (INDEPENDENT_AMBULATORY_CARE_PROVIDER_SITE_OTHER): Payer: Medicare Other | Admitting: Internal Medicine

## 2015-01-24 ENCOUNTER — Encounter: Payer: Self-pay | Admitting: Internal Medicine

## 2015-01-24 VITALS — BP 124/55 | HR 74 | Temp 97.7°F | Ht 62.0 in | Wt 119.2 lb

## 2015-01-24 DIAGNOSIS — L03119 Cellulitis of unspecified part of limb: Secondary | ICD-10-CM

## 2015-01-24 DIAGNOSIS — Z885 Allergy status to narcotic agent status: Secondary | ICD-10-CM

## 2015-01-24 DIAGNOSIS — Z883 Allergy status to other anti-infective agents status: Secondary | ICD-10-CM

## 2015-01-24 DIAGNOSIS — Z95828 Presence of other vascular implants and grafts: Secondary | ICD-10-CM | POA: Diagnosis not present

## 2015-01-24 DIAGNOSIS — I10 Essential (primary) hypertension: Secondary | ICD-10-CM

## 2015-01-24 DIAGNOSIS — I82409 Acute embolism and thrombosis of unspecified deep veins of unspecified lower extremity: Secondary | ICD-10-CM | POA: Diagnosis not present

## 2015-01-24 DIAGNOSIS — N179 Acute kidney failure, unspecified: Secondary | ICD-10-CM | POA: Diagnosis present

## 2015-01-24 DIAGNOSIS — E780 Pure hypercholesterolemia: Secondary | ICD-10-CM | POA: Diagnosis present

## 2015-01-24 DIAGNOSIS — Z880 Allergy status to penicillin: Secondary | ICD-10-CM

## 2015-01-24 DIAGNOSIS — M109 Gout, unspecified: Secondary | ICD-10-CM | POA: Diagnosis present

## 2015-01-24 DIAGNOSIS — L02619 Cutaneous abscess of unspecified foot: Secondary | ICD-10-CM | POA: Diagnosis present

## 2015-01-24 DIAGNOSIS — Z881 Allergy status to other antibiotic agents status: Secondary | ICD-10-CM

## 2015-01-24 DIAGNOSIS — K219 Gastro-esophageal reflux disease without esophagitis: Secondary | ICD-10-CM | POA: Diagnosis not present

## 2015-01-24 DIAGNOSIS — Z79891 Long term (current) use of opiate analgesic: Secondary | ICD-10-CM | POA: Diagnosis not present

## 2015-01-24 DIAGNOSIS — I82412 Acute embolism and thrombosis of left femoral vein: Secondary | ICD-10-CM | POA: Diagnosis present

## 2015-01-24 DIAGNOSIS — Z886 Allergy status to analgesic agent status: Secondary | ICD-10-CM | POA: Diagnosis not present

## 2015-01-24 DIAGNOSIS — M79605 Pain in left leg: Secondary | ICD-10-CM | POA: Diagnosis present

## 2015-01-24 DIAGNOSIS — Z888 Allergy status to other drugs, medicaments and biological substances status: Secondary | ICD-10-CM | POA: Diagnosis not present

## 2015-01-24 DIAGNOSIS — I129 Hypertensive chronic kidney disease with stage 1 through stage 4 chronic kidney disease, or unspecified chronic kidney disease: Secondary | ICD-10-CM | POA: Diagnosis present

## 2015-01-24 DIAGNOSIS — M1990 Unspecified osteoarthritis, unspecified site: Secondary | ICD-10-CM | POA: Diagnosis present

## 2015-01-24 DIAGNOSIS — L03116 Cellulitis of left lower limb: Secondary | ICD-10-CM | POA: Diagnosis not present

## 2015-01-24 DIAGNOSIS — F329 Major depressive disorder, single episode, unspecified: Secondary | ICD-10-CM | POA: Diagnosis present

## 2015-01-24 DIAGNOSIS — N183 Chronic kidney disease, stage 3 (moderate): Secondary | ICD-10-CM | POA: Diagnosis present

## 2015-01-24 DIAGNOSIS — D649 Anemia, unspecified: Secondary | ICD-10-CM | POA: Diagnosis present

## 2015-01-24 DIAGNOSIS — E785 Hyperlipidemia, unspecified: Secondary | ICD-10-CM | POA: Diagnosis present

## 2015-01-24 DIAGNOSIS — M810 Age-related osteoporosis without current pathological fracture: Secondary | ICD-10-CM | POA: Diagnosis present

## 2015-01-24 DIAGNOSIS — I739 Peripheral vascular disease, unspecified: Secondary | ICD-10-CM | POA: Diagnosis present

## 2015-01-24 DIAGNOSIS — Z86718 Personal history of other venous thrombosis and embolism: Secondary | ICD-10-CM | POA: Diagnosis not present

## 2015-01-24 DIAGNOSIS — Z79899 Other long term (current) drug therapy: Secondary | ICD-10-CM

## 2015-01-24 LAB — CBC WITH DIFFERENTIAL/PLATELET
BASOS ABS: 0 10*3/uL (ref 0–0.1)
Basophils Relative: 0 %
EOS PCT: 3 %
Eosinophils Absolute: 0.2 10*3/uL (ref 0–0.7)
HEMATOCRIT: 30.8 % — AB (ref 35.0–47.0)
HEMOGLOBIN: 10 g/dL — AB (ref 12.0–16.0)
Lymphocytes Relative: 24 %
Lymphs Abs: 1.3 10*3/uL (ref 1.0–3.6)
MCH: 31.8 pg (ref 26.0–34.0)
MCHC: 32.6 g/dL (ref 32.0–36.0)
MCV: 97.5 fL (ref 80.0–100.0)
Monocytes Absolute: 0.5 10*3/uL (ref 0.2–0.9)
Monocytes Relative: 9 %
NEUTROS ABS: 3.4 10*3/uL (ref 1.4–6.5)
NEUTROS PCT: 64 %
Platelets: 206 10*3/uL (ref 150–440)
RBC: 3.16 MIL/uL — AB (ref 3.80–5.20)
RDW: 14.6 % — ABNORMAL HIGH (ref 11.5–14.5)
WBC: 5.3 10*3/uL (ref 3.6–11.0)

## 2015-01-24 LAB — COMPREHENSIVE METABOLIC PANEL
ALK PHOS: 54 U/L (ref 38–126)
ALT: 16 U/L (ref 14–54)
ANION GAP: 9 (ref 5–15)
AST: 26 U/L (ref 15–41)
Albumin: 3.8 g/dL (ref 3.5–5.0)
BUN: 26 mg/dL — ABNORMAL HIGH (ref 6–20)
CALCIUM: 8.8 mg/dL — AB (ref 8.9–10.3)
CHLORIDE: 101 mmol/L (ref 101–111)
CO2: 23 mmol/L (ref 22–32)
CREATININE: 1.45 mg/dL — AB (ref 0.44–1.00)
GFR calc Af Amer: 35 mL/min — ABNORMAL LOW (ref >60)
GFR calc non Af Amer: 30 mL/min — ABNORMAL LOW (ref >60)
GLUCOSE: 175 mg/dL — AB (ref 65–99)
Potassium: 4.2 mmol/L (ref 3.5–5.1)
Sodium: 133 mmol/L — ABNORMAL LOW (ref 135–145)
Total Bilirubin: 0.6 mg/dL (ref 0.3–1.2)
Total Protein: 6.8 g/dL (ref 6.5–8.1)

## 2015-01-24 LAB — PROTIME-INR
INR: 1
PROTHROMBIN TIME: 13.4 s (ref 11.4–15.0)

## 2015-01-24 LAB — APTT: APTT: 27 s (ref 24–36)

## 2015-01-24 LAB — PHOSPHORUS: PHOSPHORUS: 3.6 mg/dL (ref 2.5–4.6)

## 2015-01-24 LAB — MAGNESIUM: MAGNESIUM: 1.8 mg/dL (ref 1.7–2.4)

## 2015-01-24 MED ORDER — SODIUM CHLORIDE 0.9 % IJ SOLN
3.0000 mL | INTRAMUSCULAR | Status: DC | PRN
  Administered 2015-01-25: 11:00:00 3 mL via INTRAVENOUS
  Filled 2015-01-24: qty 10

## 2015-01-24 MED ORDER — ONDANSETRON HCL 4 MG PO TABS: 4.0000 mg | ORAL_TABLET | Freq: Four times a day (QID) | ORAL | Status: DC | PRN

## 2015-01-24 MED ORDER — FUROSEMIDE 20 MG PO TABS
20.0000 mg | ORAL_TABLET | Freq: Every day | ORAL | Status: DC
  Administered 2015-01-25 – 2015-01-26 (×2): 20 mg via ORAL
  Filled 2015-01-24 (×2): qty 1

## 2015-01-24 MED ORDER — RAMIPRIL 5 MG PO CAPS
5.0000 mg | ORAL_CAPSULE | Freq: Every day | ORAL | Status: DC
  Administered 2015-01-25 – 2015-01-26 (×2): 5 mg via ORAL
  Filled 2015-01-24 (×2): qty 1

## 2015-01-24 MED ORDER — BISACODYL 5 MG PO TBEC: 5.0000 mg | DELAYED_RELEASE_TABLET | Freq: Every day | ORAL | Status: DC | PRN

## 2015-01-24 MED ORDER — HEPARIN SODIUM (PORCINE) 5000 UNIT/ML IJ SOLN
5000.0000 [IU] | Freq: Three times a day (TID) | INTRAMUSCULAR | Status: DC
  Administered 2015-01-24 – 2015-01-25 (×2): 5000 [IU] via SUBCUTANEOUS
  Filled 2015-01-24 (×3): qty 1

## 2015-01-24 MED ORDER — CLINDAMYCIN PHOSPHATE 600 MG/50ML IV SOLN
600.0000 mg | Freq: Three times a day (TID) | INTRAVENOUS | Status: DC
  Administered 2015-01-24 – 2015-01-25 (×3): 600 mg via INTRAVENOUS
  Filled 2015-01-24 (×6): qty 50

## 2015-01-24 MED ORDER — DIPHENHYDRAMINE HCL 25 MG PO CAPS
25.0000 mg | ORAL_CAPSULE | Freq: Four times a day (QID) | ORAL | Status: DC | PRN
  Administered 2015-01-24 – 2015-01-26 (×5): 25 mg via ORAL
  Filled 2015-01-24 (×5): qty 1

## 2015-01-24 MED ORDER — SODIUM CHLORIDE 0.9 % IJ SOLN
3.0000 mL | Freq: Two times a day (BID) | INTRAMUSCULAR | Status: DC
  Administered 2015-01-24 – 2015-01-25 (×3): 3 mL via INTRAVENOUS

## 2015-01-24 MED ORDER — ONDANSETRON HCL 4 MG/2ML IJ SOLN
4.0000 mg | Freq: Four times a day (QID) | INTRAMUSCULAR | Status: DC | PRN
  Administered 2015-01-25: 4 mg via INTRAVENOUS
  Filled 2015-01-24: qty 2

## 2015-01-24 MED ORDER — VANCOMYCIN HCL IN DEXTROSE 1-5 GM/200ML-% IV SOLN
1000.0000 mg | Freq: Once | INTRAVENOUS | Status: AC
  Administered 2015-01-24: 1000 mg via INTRAVENOUS
  Filled 2015-01-24: qty 200

## 2015-01-24 MED ORDER — HYDROXYZINE HCL 25 MG PO TABS
25.0000 mg | ORAL_TABLET | Freq: Once | ORAL | Status: AC
  Administered 2015-01-24: 23:00:00 25 mg via ORAL
  Filled 2015-01-24: qty 1

## 2015-01-24 MED ORDER — SERTRALINE HCL 50 MG PO TABS
50.0000 mg | ORAL_TABLET | Freq: Every day | ORAL | Status: DC
  Administered 2015-01-25 – 2015-01-26 (×2): 50 mg via ORAL
  Filled 2015-01-24 (×2): qty 1

## 2015-01-24 MED ORDER — TRAZODONE HCL 50 MG PO TABS: 25.0000 mg | ORAL_TABLET | Freq: Every evening | ORAL | Status: DC | PRN

## 2015-01-24 MED ORDER — SODIUM CHLORIDE 0.9 % IV SOLN: 250.0000 mL | INTRAVENOUS | Status: DC | PRN

## 2015-01-24 MED ORDER — PANTOPRAZOLE SODIUM 40 MG PO TBEC
40.0000 mg | DELAYED_RELEASE_TABLET | Freq: Every day | ORAL | Status: DC
  Administered 2015-01-25 – 2015-01-26 (×2): 40 mg via ORAL
  Filled 2015-01-24 (×2): qty 1

## 2015-01-24 MED ORDER — VANCOMYCIN HCL IN DEXTROSE 750-5 MG/150ML-% IV SOLN
750.0000 mg | INTRAVENOUS | Status: DC
  Filled 2015-01-24: qty 150

## 2015-01-24 MED ORDER — APIXABAN 5 MG PO TABS
5.0000 mg | ORAL_TABLET | Freq: Two times a day (BID) | ORAL | Status: DC
  Administered 2015-01-24 – 2015-01-26 (×4): 5 mg via ORAL
  Filled 2015-01-24 (×4): qty 1

## 2015-01-24 MED ORDER — HYDROCODONE-ACETAMINOPHEN 5-325 MG PO TABS
1.0000 | ORAL_TABLET | Freq: Four times a day (QID) | ORAL | Status: DC | PRN
  Administered 2015-01-24 – 2015-01-26 (×3): 1 via ORAL
  Filled 2015-01-24 (×2): qty 1

## 2015-01-24 MED ORDER — ALUM & MAG HYDROXIDE-SIMETH 200-200-20 MG/5ML PO SUSP: 30.0000 mL | Freq: Four times a day (QID) | ORAL | Status: DC | PRN

## 2015-01-24 MED ORDER — HYDROCODONE-ACETAMINOPHEN 5-325 MG PO TABS
1.0000 | ORAL_TABLET | Freq: Every day | ORAL | Status: DC
  Administered 2015-01-25 – 2015-01-26 (×2): 1 via ORAL
  Filled 2015-01-24 (×3): qty 1

## 2015-01-24 MED ORDER — DOCUSATE SODIUM 100 MG PO CAPS
100.0000 mg | ORAL_CAPSULE | Freq: Two times a day (BID) | ORAL | Status: DC
  Administered 2015-01-24 – 2015-01-26 (×4): 100 mg via ORAL
  Filled 2015-01-24 (×4): qty 1

## 2015-01-24 NOTE — Progress Notes (Signed)
ANTICOAGULATION CONSULT NOTE - Initial Consult  Pharmacy Consult for Apixaban Indication: DVT  Allergies  Allergen Reactions  . Cefuroxime Axetil Rash    Pt states that she does fine with Keflex.    . Doxycycline Nausea Only and Other (See Comments)    Reaction:  Weight loss   . Advil [Ibuprofen] Swelling  . Celebrex [Celecoxib] Nausea Only  . Daypro [Oxaprozin] Other (See Comments)    Reaction:  Dizziness   . Etodolac Nausea Only  . Macrobid [employer] Macro] Other (See Comments)    Reaction:  Burning   . Penicillins Other (See Comments)    Reaction:  Burning   . Percocet [Oxycodone-Acetaminophen] Nausea Only and Swelling  . Tramadol Other (See Comments)    Reaction:  Unknown   . Valium [Diazepam] Other (See Comments)    Reaction:  Dizziness    . Neosporin [Neomycin-Bacitracin Zn-Polymyx] Rash  . Vicodin [Hydrocodone-Acetaminophen] Nausea Only    Patient Measurements: Height: 5\' 2"  (157.5 cm) Weight: 119 lb (53.978 kg) IBW/kg (Calculated) : 50.1 Heparin Dosing Weight:   Vital Signs: Temp: 97.7 F (36.5 C) (06/08 1333) Temp Source: Oral (06/08 1333) BP: 160/74 mmHg (06/08 1333) Pulse Rate: 72 (06/08 1333)  Labs:  Recent Labs  01/24/15 1517  HGB 10.0*  HCT 30.8*  PLT 206  APTT 27  LABPROT 13.4  INR 1.00  CREATININE 1.45*    Estimated Creatinine Clearance: 20 mL/min (by C-G formula based on Cr of 1.45).   Medical History: Past Medical History  Diagnosis Date  . Arthritis   . Osteoporosis   . Hypercholesterolemia   . Anemia   . GERD (gastroesophageal reflux disease)   . DVT (deep venous thrombosis), left     bilateral, IVC filter 2010  . Depression   . Gout   . Nephrolithiasis   . 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 Renal vein thrombosis     previous renal insufficiency  . Hypertension     Dr. [name]  . Cancer     skin ca lesion of scalp- removed  . Peripheral vascular disease    . Spider veins     Medications:  Prescriptions prior to admission  Medication Sig Dispense Refill Last Dose  . acetaminophen (TYLENOL) 650 MG CR tablet Take 650 mg by mouth every 8 (eight) hours as needed for pain.    Past Week at Unknown time  . bifidobacterium infantis (ALIGN) capsule Take 1 capsule by mouth daily. 30 capsule 0 01/23/2015 at Unknown time  . Calcium Carb-Cholecalciferol (CALCIUM 600 + D PO) Take 1 tablet by mouth 3 (three) times daily.   01/24/2015 at Unknown time  . furosemide (LASIX) 20 MG tablet Take 1 tablet (20 mg total) by mouth daily. (Patient taking differently: Take 20 mg by mouth daily. ) 30 tablet 5 01/23/2015 at Unknown time  . hydrocerin (EUCERIN) CREA Apply 1 application topically 2 (two) times daily. 113 g 1 01/24/2015 at Unknown time  . HYDROcodone-acetaminophen (NORCO/VICODIN) 5-325 MG per tablet Take 1 tablet by mouth daily.    01/24/2015 at Unknown time  . Misc Natural Products (OSTEO BI-FLEX JOINT SHIELD PO) Take 1 tablet by mouth 2 (two) times daily.   01/24/2015 at Unknown time  . omeprazole (PRILOSEC) 20 MG capsule Take 1 capsule by mouth  daily (Patient taking differently: Take 20 mg by mouth daily. ) 90 capsule 0 01/24/2015 at Unknown time  . ramipril (ALTACE) 5 MG  capsule Take 1 capsule by mouth  daily (Patient taking differently: Take 5 mg by mouth daily. ) 90 capsule 0 01/24/2015 at Unknown time  . sertraline (ZOLOFT) 50 MG tablet Take 1 tablet by mouth  daily (Patient taking differently: Take 50 mg by mouth daily. ) 90 tablet 0 01/24/2015 at Unknown time  . sulfamethoxazole-trimethoprim (BACTRIM DS,SEPTRA DS) 800-160 MG per tablet Take 1 tablet by mouth 2 (two) times daily. 20 tablet 0 01/24/2015 at Unknown time    Assessment: Pt with DVT, CrCl = 20 ml/min Goal of Therapy:   DVT treatment  Plan:  Will start Apixaban 5 mg PO BID.   Salvador Bigbee D 01/24/2015,7:10 PM

## 2015-01-24 NOTE — Plan of Care (Signed)
Problem: Discharge Progression Outcomes Goal: Wound improving/decreased edema Outcome: Progressing Infection control Dr came to see pt and discontinued vancomycin and put patient on clindamycin q8h for cellulitits Elevate leg on pillows while in bed Dc 1-2 days on oral abt.

## 2015-01-24 NOTE — Progress Notes (Signed)
ANTIBIOTIC CONSULT NOTE - INITIAL  Pharmacy Consult for Vancomycin Indication: cellulitis with abscess  Allergies  Allergen Reactions  . Cefuroxime Axetil Rash    Pt states that she does fine with Keflex.    . Doxycycline Nausea Only and Other (See Comments)    Reaction:  Weight loss   . Advil [Ibuprofen] Swelling  . Celebrex [Celecoxib] Nausea Only  . Daypro [Oxaprozin] Other (See Comments)    Reaction:  Dizziness   . Etodolac Nausea Only  . Macrobid [employer] Macro] Other (See Comments)    Reaction:  Burning   . Penicillins Other (See Comments)    Reaction:  Burning   . Percocet [Oxycodone-Acetaminophen] Nausea Only and Swelling  . Tramadol Other (See Comments)    Reaction:  Unknown   . Valium [Diazepam] Other (See Comments)    Reaction:  Dizziness    . Neosporin [Neomycin-Bacitracin Zn-Polymyx] Rash  . Vicodin [Hydrocodone-Acetaminophen] Nausea Only    Patient Measurements: Height: 5\' 2"  (157.5 cm) Weight: 119 lb (53.978 kg) IBW/kg (Calculated) : 50.1 Adjusted Body Weight: 51.7 kg  Vital Signs: Temp: 97.7 F (36.5 C) (06/08 1333) Temp Source: Oral (06/08 1333) BP: 160/74 mmHg (06/08 1333) Pulse Rate: 72 (06/08 1333) Intake/Output from previous day:   Intake/Output from this shift: Total I/O In: 240 [P.O.:240] Out: 450 [Urine:450]  Labs:  Recent Labs  01/24/15 1517  WBC 5.3  HGB 10.0*  PLT 206  CREATININE 1.45*   Estimated Creatinine Clearance: 20 mL/min (by C-G formula based on Cr of 1.45). No results for input(s): VANCOTROUGH, VANCOPEAK, VANCORANDOM, GENTTROUGH, GENTPEAK, GENTRANDOM, TOBRATROUGH, TOBRAPEAK, TOBRARND, AMIKACINPEAK, AMIKACINTROU, AMIKACIN in the last 72 hours.   Microbiology: Recent Results (from the past 720 hour(s))  Culture, blood (routine x 2)     Status: None   Collection Time: 01/12/15 11:55 PM  Result Value Ref Range Status   Specimen Description BLOOD  Final   Special Requests NONE  Final   Culture NO GROWTH  5 DAYS  Final   Report Status 01/18/2015 FINAL  Final  Culture, blood (routine x 2)     Status: None   Collection Time: 01/12/15 11:58 PM  Result Value Ref Range Status   Specimen Description BLOOD  Final   Special Requests NONE  Final   Culture NO GROWTH 5 DAYS  Final   Report Status 01/18/2015 FINAL  Final    Medical History: Past Medical History  Diagnosis Date  . Arthritis   . Osteoporosis   . Hypercholesterolemia   . Anemia   . GERD (gastroesophageal reflux disease)   . DVT (deep venous thrombosis), left     bilateral, IVC filter 2010  . Depression   . Gout   . Nephrolithiasis   . 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 Renal vein thrombosis     previous renal insufficiency  . Hypertension     Dr. [name]  . Cancer     skin ca lesion of scalp- removed  . Peripheral vascular disease   . Spider veins     Medications:  Prescriptions prior to admission  Medication Sig Dispense Refill Last Dose  . acetaminophen (TYLENOL) 650 MG CR tablet Take 650 mg by mouth every 8 (eight) hours as needed for pain.    Past Week at Unknown time  . bifidobacterium infantis (ALIGN) capsule Take 1 capsule by mouth daily. 30 capsule 0 01/23/2015 at Unknown time  . Calcium Carb-Cholecalciferol (CALCIUM 600 +  D PO) Take 1 tablet by mouth 3 (three) times daily.   01/24/2015 at Unknown time  . furosemide (LASIX) 20 MG tablet Take 1 tablet (20 mg total) by mouth daily. (Patient taking differently: Take 20 mg by mouth daily. ) 30 tablet 5 01/23/2015 at Unknown time  . hydrocerin (EUCERIN) CREA Apply 1 application topically 2 (two) times daily. 113 g 1 01/24/2015 at Unknown time  . HYDROcodone-acetaminophen (NORCO/VICODIN) 5-325 MG per tablet Take 1 tablet by mouth daily.    01/24/2015 at Unknown time  . Misc Natural Products (OSTEO BI-FLEX JOINT SHIELD PO) Take 1 tablet by mouth 2 (two) times daily.   01/24/2015 at Unknown time  . omeprazole (PRILOSEC) 20 MG  capsule Take 1 capsule by mouth  daily (Patient taking differently: Take 20 mg by mouth daily. ) 90 capsule 0 01/24/2015 at Unknown time  . ramipril (ALTACE) 5 MG capsule Take 1 capsule by mouth  daily (Patient taking differently: Take 5 mg by mouth daily. ) 90 capsule 0 01/24/2015 at Unknown time  . sertraline (ZOLOFT) 50 MG tablet Take 1 tablet by mouth  daily (Patient taking differently: Take 50 mg by mouth daily. ) 90 tablet 0 01/24/2015 at Unknown time  . sulfamethoxazole-trimethoprim (BACTRIM DS,SEPTRA DS) 800-160 MG per tablet Take 1 tablet by mouth 2 (two) times daily. 20 tablet 0 01/24/2015 at Unknown time   Assessment: CrCl = 31.8 ml/mn Ke = 0.03 hr-1 T1/2 = 23.1 hrs Vd = 36.2 L  Goal of Therapy:  Vancomycin trough level 15-20 mcg/ml  Plan:  Vancomycin 1 gm IV X 1 given on 6/8 @ 15:00.  Vancomycin 750 mg IV Q24H ordered to start on 6/9 @ 12:00, ~ 21 hrs after 1st dose. This pt will reach Css by 6/13 @ 15:00. Will draw 1st trough on 6/12 @ 11:30, which will not be at Css.  Khelani Kops D 01/24/2015,6:59 PM

## 2015-01-24 NOTE — Plan of Care (Signed)
Problem: Discharge Progression Outcomes Goal: Barriers To Progression Addressed/Resolved Individualization Outcome: Progressing From home lives alone, sister in and out of home, uses a cane and walker PRN at home with ambulation, high fall risk, recent falls, direct admit from [employer] office, history of back surgeries, history of HTN controlled by home meds

## 2015-01-24 NOTE — Consult Note (Signed)
[facility] Infectious Disease     Reason for Consult: Cellulitis    Referring Physician: Dr Max Sane Date of Admission:  01/24/2015   Active Problems:   Cellulitis and abscess of foot   HPI: Kayla Galloway is a 81 y.o. female admitted 6/8 with worsening redness and infection in LE Was recently admitted 5/27-5/30 with same. Treated with IV vanco then discharged on oral bactrim.   Had USS with distal LLE clot noted as well but not anticoagulated.   Past Medical History  Diagnosis Date  . Arthritis   . Osteoporosis   . Hypercholesterolemia   . Anemia   . GERD (gastroesophageal reflux disease)   . DVT (deep venous thrombosis), left     bilateral, IVC filter 2010  . Depression   . Gout   . Nephrolithiasis   . 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 Renal vein thrombosis     previous renal insufficiency  . Hypertension     Dr. [name]  . Cancer     skin ca lesion of scalp- removed  . Peripheral vascular disease   . Spider veins    Past Surgical History  Procedure Laterality Date  . Fracture surgery  2009    hip  . Right oophorectomy      partial-  . Eye surgery      bil eyes  . Colonsopy    . Tonsillectomy      age 53  . Skin cancer excision      top of head  . Ivc filter      pt states it has turned side ways and could not be removed.  . Vertebroplasty  12/31/2011    Procedure: VERTEBROPLASTY;  Surgeon: Winfield Cunas, MD;  Location: [place] [facility];  Service: Neurosurgery;  Laterality: N/A;  Thoracic eleven vertebroplasty  . Ectopic pregnancy surgery    . Hemorrhoid surgery    . Cardiac catheterization      2010 Does not see a cardiac  doctor  . Dental surgery    . Kyphoplasty  09/03/2012    Procedure: KYPHOPLASTY;  Surgeon: Winfield Cunas, MD;  Location: [place] [facility];  Service: Neurosurgery;  Laterality: N/A;  Thoracic eight Kyphoplasty   History  Substance Use Topics  . Smoking status: Never Smoker   .  Smokeless tobacco: Never Used  . Alcohol Use: No   Family History  Problem Relation Age of Onset  . Anesthesia problems Neg Hx   . Heart disease Father     myocardial infarction  . Heart disease Brother     myocardial infarction  . Lymphoma Sister   . Breast cancer Neg Hx   . Colon cancer Neg Hx     Allergies:  Allergies  Allergen Reactions  . Cefuroxime Axetil Rash    Pt states that she does fine with Keflex.    . Doxycycline Nausea Only and Other (See Comments)    Reaction:  Weight loss   . Advil [Ibuprofen] Swelling  . Celebrex [Celecoxib] Nausea Only  . Daypro [Oxaprozin] Other (See Comments)    Reaction:  Dizziness   . Etodolac Nausea Only  . Macrobid [employer] Macro] Other (See Comments)    Reaction:  Burning   . Penicillins Other (See Comments)    Reaction:  Burning   . Percocet [Oxycodone-Acetaminophen] Nausea Only and Swelling  . Tramadol Other (See Comments)    Reaction:  Unknown   .  Valium [Diazepam] Other (See Comments)    Reaction:  Dizziness    . Neosporin [Neomycin-Bacitracin Zn-Polymyx] Rash  . Vicodin [Hydrocodone-Acetaminophen] Nausea Only    Current antibiotics: Antibiotics Given (last 72 hours)    Date/Time Action Medication Dose Rate   01/24/15 1451 Given   vancomycin (VANCOCIN) IVPB 1000 mg/200 mL premix 1,000 mg 200 mL/hr      MEDICATIONS: . docusate sodium  100 mg Oral BID  . furosemide  20 mg Oral Daily  . heparin  5,000 Units Subcutaneous 3 times per day  . HYDROcodone-acetaminophen  1 tablet Oral Daily  . pantoprazole  40 mg Oral Daily  . ramipril  5 mg Oral Daily  . sertraline  50 mg Oral Daily  . sodium chloride  3 mL Intravenous Q12H  . vancomycin  1,000 mg Intravenous Once   Review of Systems - 11 systems reviewed and negative per HPI  OBJECTIVE: Temp:  [97.7 F (36.5 C)] 97.7 F (36.5 C) (06/08 1333) Pulse Rate:  [72-74] 72 (06/08 1333) BP: (124-160)/(55-74) 160/74 mmHg (06/08 1333) SpO2:  [100 %] 100 %  (06/08 1333) Weight:  [53.978 kg (119 lb)-54.091 kg (119 lb 4 oz)] 53.978 kg (119 lb) (06/08 1400) Physical Exam  Constitutional:  Chronically ill appearing oriented  HENT: [place]/AT, PERRLA, no scleral icterus Mouth/Throat: Oropharynx is clear and moist. No oropharyngeal exudate.  Cardiovascular: Normal rate, regular rhythm and normal heart sounds.1/6 sm Pulmonary/Chest: Effort normal and breath sounds normal. No respiratory distress.  has no wheezes.  Neck - supple, no nuchal rigidity Abdominal: Soft. Bowel sounds are normal.  exhibits no distension. There is no tenderness.  Lymphadenopathy: no cervical adenopathy. No axillary adenopathy Neurological: alert and oriented to person, place, and time.  EXT - 2 + RLE edema, 1 + LLE Skin: LLE with splotchy mod- severe erythema to knee, warm tender. No drainage. Has a scar lateral calf of old healed wound but no drainage. RLE with mod erythema to above ankle Psychiatric: a normal mood and affect.  behavior is normal.   LABS: No results found for this or any previous visit (from the past 48 hour(s)). No components found for: ESR, C REACTIVE PROTEIN MICRO: Recent Results (from the past 720 hour(s))  Culture, blood (routine x 2)     Status: None   Collection Time: 01/12/15 11:55 PM  Result Value Ref Range Status   Specimen Description BLOOD  Final   Special Requests NONE  Final   Culture NO GROWTH 5 DAYS  Final   Report Status 01/18/2015 FINAL  Final  Culture, blood (routine x 2)     Status: None   Collection Time: 01/12/15 11:58 PM  Result Value Ref Range Status   Specimen Description BLOOD  Final   Special Requests NONE  Final   Culture NO GROWTH 5 DAYS  Final   Report Status 01/18/2015 FINAL  Final    IMAGING: Dg Tibia/fibula Left  01/12/2015   CLINICAL DATA:  [place] in November, chronic pain, hurts mostly in distal end of tib/fib. Swollen. No recurrent injury.  EXAM: LEFT TIBIA AND FIBULA - 2 VIEW  COMPARISON:  None.  FINDINGS: No  fracture. No bone lesion. Bones are demineralized. Ankle and knee joints are normally aligned. There are dense vascular calcifications throughout the calf.  There is diffuse soft tissue edema.  IMPRESSION: 1. No fracture.  No bone lesion. 2. Diffuse soft tissue edema, nonspecific.   Electronically Signed   By: Lajean Manes M.D.   On: 01/12/2015  19:20   US Venous Img Lower Unilateral Left  01/12/2015   CLINICAL DATA:  Left leg swelling and redness below-the-knee for 3 weeks.  EXAM: LEFT LOWER EXTREMITY VENOUS DOPPLER ULTRASOUND  TECHNIQUE: Gray-scale sonography with graded compression, as well as color Doppler and duplex ultrasound were performed to evaluate the lower extremity deep venous systems from the level of the common femoral vein and including the common femoral, femoral, profunda femoral, popliteal and calf veins including the posterior tibial, peroneal and gastrocnemius veins when visible. The superficial great saphenous vein was also interrogated. Spectral Doppler was utilized to evaluate flow at rest and with distal augmentation maneuvers in the common femoral, femoral and popliteal veins.  COMPARISON:  None.  FINDINGS: Contralateral Common Femoral Vein: Respiratory phasicity is normal and symmetric with the symptomatic side. No evidence of thrombus. Normal compressibility.  Common Femoral Vein: No evidence of thrombus. Normal compressibility, respiratory phasicity and response to augmentation.  Saphenofemoral Junction: Patent without thrombus.  Profunda Femoral Vein: No evidence of thrombus. Normal compressibility and flow on color Doppler imaging.  Femoral Vein: There is a short segment of nonocclusive thrombus in the distal femoral vein. No other thrombus.  Popliteal Vein: No evidence of thrombus. Normal compressibility, respiratory phasicity and response to augmentation.  Calf Veins: Not visualized.  Thrombus not excluded.  IMPRESSION: There is nonocclusive deep venous thrombosis in the distal  left femoral vein. No thrombus proximal to this. Popliteal vein is patent. Calf veins are not visualized.   Electronically Signed   By: Lajean Manes M.D.   On: 01/12/2015 20:47    Assessment:   Kayla Galloway is a 81 y.o. female with a known history of hypertension, hyperlipidemia and left leg injury in November 2015 who had been followed by the wound Center for nonhealing wounds until 12/21/2014 recently admitted with LLE cellulitis and dced on bactrim. Recurrent infection lead to readmission.  She has a healed wound on LLE that does not have any evidence of abscess or purulence.  She is allergic to PCN in terms of causing "burning on her bottom".   Has cephalosporin allergy listed as rash This is almost certainly Group strep infection.  No evidence of purulence to suggest MRSA.  Bactrim on which she was dced, does not cover Group strep very well.   Recommendations Dc vanco Start clindamycin 600 q 8 IV for infection Elevate leg on 6 pillows. If improving can go home in 1-2 days on oral clindamycin.  Thank you very much for allowing me to participate in the care of this patient. Please call with questions.   Cheral Marker. Ola Spurr, MD

## 2015-01-24 NOTE — H&P (Signed)
[facility] [place] NAME: Kayla Galloway    MR#:  [id number]  DATE OF BIRTH:  [birth year]  DATE OF ADMISSION:  01/24/2015  PRIMARY CARE PHYSICIAN: [name], MD   REQUESTING/REFERRING PHYSICIAN:Klarich, Randell Patient, MD  CHIEF COMPLAINT:  Worsening leg infection  HISTORY OF PRESENT ILLNESS:  Kayla Galloway  is a 81 y.o. female with a known history of hypertension, hyperlipidemia and gout left leg injury in November 2015 who had been followed by the wound Center for nonhealing wounds until 12/21/2014 was sent in from her primary care physician's office with worsening swelling, erythema, pain in the left leg and also now right leg. She has not had any fevers or chills. She has not had any change in her appetite no nausea vomiting diarrhea or weakness. She also reports weeping drainage from lower extremity cellulitis.  Patient was recently discharged on oral Bactrim with 10 days of course, but as per her primary physician .  Patient's wound Getting worse and now she has in the right lower extremity also for which she is pain admitted for further evaluation and management.  PAST MEDICAL HISTORY:   Past Medical History  Diagnosis Date  . Arthritis   . Osteoporosis   . Hypercholesterolemia   . Anemia   . GERD (gastroesophageal reflux disease)   . DVT (deep venous thrombosis), left     bilateral, IVC filter 2010  . Depression   . Gout   . Nephrolithiasis   . 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 Renal vein thrombosis     previous renal insufficiency  . Hypertension     Dr. [name]  . Cancer     skin ca lesion of scalp- removed  . Peripheral vascular disease   . Spider veins     PAST SURGICAL HISTORY:   Past Surgical History  Procedure Laterality Date  . Fracture surgery  2009    hip  . Right oophorectomy      partial-  . Eye surgery      bil eyes  . Colonsopy    .  Tonsillectomy      age 58  . Skin cancer excision      top of head  . Ivc filter      pt states it has turned side ways and could not be removed.  . Vertebroplasty  12/31/2011    Procedure: VERTEBROPLASTY;  Surgeon: Winfield Cunas, MD;  Location: [facility];  Service: Neurosurgery;  Laterality: N/A;  Thoracic eleven vertebroplasty  . Ectopic pregnancy surgery    . Hemorrhoid surgery    . Cardiac catheterization      2010 Does not see a cardiac  doctor  . Dental surgery    . Kyphoplasty  09/03/2012    Procedure: KYPHOPLASTY;  Surgeon: Winfield Cunas, MD;  Location: [facility];  Service: Neurosurgery;  Laterality: N/A;  Thoracic eight Kyphoplasty    SOCIAL HISTORY:   History  Substance Use Topics  . Smoking status: Never Smoker   . Smokeless tobacco: Never Used  . Alcohol Use: No    FAMILY HISTORY:   Family History  Problem Relation Age of Onset  . Anesthesia problems Neg Hx   . Heart disease Father     myocardial infarction  . Heart disease Brother     myocardial infarction  . Lymphoma Sister   . Breast cancer Neg Hx   .  Colon cancer Neg Hx     DRUG ALLERGIES:   Allergies  Allergen Reactions  . Cefuroxime Axetil Rash    Pt states that she does fine with Keflex.    . Doxycycline Nausea Only and Other (See Comments)    Reaction:  Weight loss   . Advil [Ibuprofen] Swelling  . Celebrex [Celecoxib] Nausea Only  . Daypro [Oxaprozin] Other (See Comments)    Reaction:  Dizziness   . Etodolac Nausea Only  . Macrobid [employer] Macro] Other (See Comments)    Reaction:  Burning   . Penicillins Other (See Comments)    Reaction:  Burning   . Percocet [Oxycodone-Acetaminophen] Nausea Only and Swelling  . Tramadol Other (See Comments)    Reaction:  Unknown   . Valium [Diazepam] Other (See Comments)    Reaction:  Dizziness    . Neosporin [Neomycin-Bacitracin Zn-Polymyx] Rash  . Vicodin [Hydrocodone-Acetaminophen] Nausea Only    REVIEW OF SYSTEMS:    Review of Systems  Constitutional: Negative for fever, weight loss, malaise/fatigue and diaphoresis.  HENT: Negative for ear discharge, ear pain, hearing loss, nosebleeds, sore throat and tinnitus.   Eyes: Negative for blurred vision and pain.  Respiratory: Negative for cough, hemoptysis, shortness of breath and wheezing.   Cardiovascular: Negative for chest pain, palpitations, orthopnea and leg swelling.  Gastrointestinal: Negative for heartburn, nausea, vomiting, abdominal pain, diarrhea, constipation and blood in stool.  Genitourinary: Negative for dysuria, urgency and frequency.  Musculoskeletal: Negative for myalgias and back pain.  Skin: Positive for itching and rash.  Neurological: Negative for dizziness, tingling, tremors, focal weakness, seizures, weakness and headaches.  Psychiatric/Behavioral: Negative for depression. The patient is not nervous/anxious.     MEDICATIONS AT HOME:   Prior to Admission medications   Medication Sig Start Date End Date Taking? Authorizing Provider  acetaminophen (TYLENOL) 650 MG CR tablet Take 650 mg by mouth every 8 (eight) hours as needed for pain.    Yes Historical Provider, MD  bifidobacterium infantis (ALIGN) capsule Take 1 capsule by mouth daily. 05/22/14  Yes [name], MD  Calcium Carb-Cholecalciferol (CALCIUM 600 + D PO) Take 1 tablet by mouth 3 (three) times daily.   Yes Historical Provider, MD  furosemide (LASIX) 20 MG tablet Take 1 tablet (20 mg total) by mouth daily. Patient taking differently: Take 20 mg by mouth daily.  07/19/14  Yes [name], MD  hydrocerin (EUCERIN) CREA Apply 1 application topically 2 (two) times daily. 01/15/15  Yes Henreitta Leber, MD  HYDROcodone-acetaminophen (NORCO/VICODIN) 5-325 MG per tablet Take 1 tablet by mouth daily.    Yes Historical Provider, MD  Misc Natural Products (OSTEO BI-FLEX JOINT SHIELD PO) Take 1 tablet by mouth 2 (two) times daily.   Yes Historical Provider, MD  omeprazole  (PRILOSEC) 20 MG capsule Take 1 capsule by mouth  daily Patient taking differently: Take 20 mg by mouth daily.  12/25/14  Yes [name], MD  ramipril (ALTACE) 5 MG capsule Take 1 capsule by mouth  daily Patient taking differently: Take 5 mg by mouth daily.  12/25/14  Yes [name], MD  sertraline (ZOLOFT) 50 MG tablet Take 1 tablet by mouth  daily Patient taking differently: Take 50 mg by mouth daily.  12/25/14  Yes [name], MD  sulfamethoxazole-trimethoprim (BACTRIM DS,SEPTRA DS) 800-160 MG per tablet Take 1 tablet by mouth 2 (two) times daily. 01/15/15  Yes Henreitta Leber, MD      VITAL SIGNS:  Blood pressure 160/74, pulse 72, temperature  97.7 F (36.5 C), temperature source Oral, height 5\' 2"  (1.575 m), weight 53.978 kg (119 lb), SpO2 100 %.  PHYSICAL EXAMINATION:  Physical Exam  Constitutional: She is oriented to person, place, and time and well-developed, well-nourished, and in no distress.  HENT:  Head: Normocephalic and atraumatic.  Eyes: Conjunctivae and EOM are normal. Pupils are equal, round, and reactive to light.  Neck: Normal range of motion. Neck supple. No tracheal deviation present. No thyromegaly present.  Cardiovascular: Normal rate, regular rhythm and normal heart sounds.   Pulmonary/Chest: Effort normal and breath sounds normal. No respiratory distress. She has no wheezes. She exhibits no tenderness.  Abdominal: Soft. Bowel sounds are normal. She exhibits no distension. There is no tenderness.  Musculoskeletal: Normal range of motion. She exhibits edema.  2 + RLE edema, 1 + LLE  Neurological: She is alert and oriented to person, place, and time. No cranial nerve deficit.  Skin: Skin is warm and dry. Lesion and rash noted. Rash is macular. There is erythema.     LLE with mod- severe erythema to knee, warm tender. No drainage. Has a scar lateral calf of old healed wound but no drainage. RLE with mod erythema above ankle.   Psychiatric: Mood and affect  normal.    LABORATORY PANEL:   CBC  Recent Labs Lab 01/24/15 1517  WBC 5.3  HGB 10.0*  HCT 30.8*  PLT 206    IMPRESSION AND PLAN:   1 left lower extremity cellulitis: Erythema edema and pain over the left lower extremity and now also in the right lower extremity. She is unable to bear weight on the leg at this time. Ultrasound is positive for DVT on last admission. Will start vanco and c/s ID. discussed with Dr. Ola Spurr recommended changing to IV clindamycin to cover group a strep  2 Nonocclusive deep venous thrombosis in the distal left femoral vein: start eliquis at this time  3. gastroesophageal reflux disease: Continue Protonix  4. anemia: Hemoglobin stable from prior values  5. Acute on chronic kidney disease stage III: Creatinine some up, monitor    All the records are reviewed and case discussed with ED provider. Management plans discussed with the patient and she is in agreement.  CODE STATUS: Full Code  TOTAL TIME TAKING CARE OF THIS PATIENT: 55 minutes.    Sharon, Fartun Paradiso M.D on 01/24/2015 at 4:15 PM  Between 7am to 6pm - Pager - [phone]  After 6pm go to [email or web address] - password [email or web address] [facility]  Office  [phone]  CC: Primary care physician; [name], MD

## 2015-01-24 NOTE — Progress Notes (Signed)
Pt c/o itching even after Benadryl. Dr Posey Pronto notified. Order given for Atarax 25mg  once. Jeffie Pollock, RN

## 2015-01-24 NOTE — Progress Notes (Signed)
Pre visit review using our clinic review tool, if applicable. No additional management support is needed unless otherwise documented below in the visit note. 

## 2015-01-25 LAB — CBC
HEMATOCRIT: 27.5 % — AB (ref 35.0–47.0)
Hemoglobin: 9.3 g/dL — ABNORMAL LOW (ref 12.0–16.0)
MCH: 32.7 pg (ref 26.0–34.0)
MCHC: 33.7 g/dL (ref 32.0–36.0)
MCV: 97.1 fL (ref 80.0–100.0)
Platelets: 182 10*3/uL (ref 150–440)
RBC: 2.83 MIL/uL — AB (ref 3.80–5.20)
RDW: 14.8 % — ABNORMAL HIGH (ref 11.5–14.5)
WBC: 4.9 10*3/uL (ref 3.6–11.0)

## 2015-01-25 LAB — BASIC METABOLIC PANEL
Anion gap: 5 (ref 5–15)
BUN: 25 mg/dL — ABNORMAL HIGH (ref 6–20)
CALCIUM: 8.3 mg/dL — AB (ref 8.9–10.3)
CO2: 25 mmol/L (ref 22–32)
CREATININE: 1.25 mg/dL — AB (ref 0.44–1.00)
Chloride: 104 mmol/L (ref 101–111)
GFR calc Af Amer: 42 mL/min — ABNORMAL LOW (ref >60)
GFR calc non Af Amer: 36 mL/min — ABNORMAL LOW (ref >60)
Glucose, Bld: 83 mg/dL (ref 65–99)
Potassium: 4.3 mmol/L (ref 3.5–5.1)
Sodium: 134 mmol/L — ABNORMAL LOW (ref 135–145)

## 2015-01-25 MED ORDER — CLINDAMYCIN HCL 150 MG PO CAPS
300.0000 mg | ORAL_CAPSULE | Freq: Three times a day (TID) | ORAL | Status: DC
  Administered 2015-01-25 – 2015-01-26 (×4): 300 mg via ORAL
  Filled 2015-01-25 (×4): qty 2

## 2015-01-25 MED ORDER — WHITE PETROLATUM GEL
Freq: Every day | Status: DC
  Administered 2015-01-25 – 2015-01-26 (×2): via TOPICAL
  Filled 2015-01-25 (×2): qty 5

## 2015-01-25 MED ORDER — HYDROXYZINE HCL 10 MG PO TABS
10.0000 mg | ORAL_TABLET | Freq: Three times a day (TID) | ORAL | Status: DC | PRN
  Administered 2015-01-26: 10 mg via ORAL
  Filled 2015-01-25: qty 1

## 2015-01-25 MED ORDER — PREDNISONE 50 MG PO TABS
50.0000 mg | ORAL_TABLET | Freq: Once | ORAL | Status: AC
  Administered 2015-01-25: 50 mg via ORAL
  Filled 2015-01-25: qty 1

## 2015-01-25 NOTE — Progress Notes (Signed)
Date of Admission:  01/24/2015     ID: Kayla Galloway is a 81 y.o. female with  cellulitis  Active Problems:   Cellulitis and abscess of foot   Subjective: No fevers, less redness  Medications:  . apixaban  5 mg Oral BID  . clindamycin (CLEOCIN) IV  600 mg Intravenous 3 times per day  . docusate sodium  100 mg Oral BID  . furosemide  20 mg Oral Daily  . HYDROcodone-acetaminophen  1 tablet Oral Daily  . pantoprazole  40 mg Oral Daily  . ramipril  5 mg Oral Daily  . sertraline  50 mg Oral Daily  . sodium chloride  3 mL Intravenous Q12H    Objective: Vital signs in last 24 hours: Temp:  [97.6 F (36.4 C)-97.7 F (36.5 C)] 97.6 F (36.4 C) (06/09 1400) Pulse Rate:  [66-72] 66 (06/09 1400) Resp:  [17-18] 17 (06/09 1400) BP: (115-132)/(47-51) 115/47 mmHg (06/09 1400) SpO2:  [97 %-100 %] 100 % (06/09 1400) Weight:  [58.378 kg (128 lb 11.2 oz)] 58.378 kg (128 lb 11.2 oz) (06/09 0500) Constitutional: Chronically ill appearing oriented  HENT: [place]/AT, PERRLA, no scleral icterus Mouth/Throat: Oropharynx is clear and moist. No oropharyngeal exudate.  Cardiovascular: Normal rate, regular rhythm and normal heart sounds.1/6 sm Pulmonary/Chest: Effort normal and breath sounds normal. No respiratory distress. has no wheezes.  Neck - supple, no nuchal rigidity Abdominal: Soft. Bowel sounds are normal. exhibits no distension. There is no tenderness.  Lymphadenopathy: no cervical adenopathy. No axillary adenopathy Neurological: alert and oriented to person, place, and time.  EXT - 2 + RLE edema, 1 + LLE Skin: LLE with splotchy mild  erythema to knee, warm tender. No drainage. Has a scar lateral calf of old healed wound but no drainage. RLE with mod erythema to above ankle Psychiatric: a normal mood and affect. behavior is normal.   Lab Results  Recent Labs  01/24/15 1517 01/25/15 0511  WBC 5.3 4.9  HGB 10.0* 9.3*  HCT 30.8* 27.5*  NA 133* 134*  K 4.2 4.3  CL 101 104  CO2  23 25  BUN 26* 25*  CREATININE 1.45* 1.25*   Liver Panel  Recent Labs  01/24/15 1517  PROT 6.8  ALBUMIN 3.8  AST 26  ALT 16  ALKPHOS 37  BILITOT 0.6   Microbiology: Results for orders placed or performed during the hospital encounter of 01/24/15  Culture, blood (routine x 2)     Status: None (Preliminary result)   Collection Time: 01/24/15  3:16 PM  Result Value Ref Range Status   Specimen Description BLOOD  Final   Special Requests Normal  Final   Culture NO GROWTH < 24 HOURS  Final   Report Status PENDING  Incomplete  Culture, blood (routine x 2)     Status: None (Preliminary result)   Collection Time: 01/24/15  3:16 PM  Result Value Ref Range Status   Specimen Description BLOOD  Final   Special Requests Normal  Final   Culture NO GROWTH < 24 HOURS  Final   Report Status PENDING  Incomplete    Studies/Results:  No results found.  Assessment/Plan: Kayla Galloway is a 81 y.o. female with a known history of hypertension, hyperlipidemia and left leg injury in November 2015 who had been followed by the wound Center for nonhealing wounds until 12/21/2014 recently admitted with LLE cellulitis and dced on bactrim. Recurrent infection lead to readmission. She has a healed wound on LLE that does not  have any evidence of abscess or purulence. She is allergic to PCN in terms of causing "burning on her bottom". Has cephalosporin allergy listed as rash This is almost certainly Group strep infection. No evidence of purulence to suggest MRSA. Bactrim on which she was dced, does not cover Group strep very well. Much improved with clindamycin and elevation  Recommendations Change clindamycin to oral  Continue elevate leg on 6 pillows. If improving can go home on oral clindamycin for 7 more days - will need emolient to skin on legs given dry skin and some chronic venous stasis dermatitis - can start vaseline (ordered)  [place], Mandell Pangborn   01/25/2015, 2:53 PM

## 2015-01-25 NOTE — Plan of Care (Signed)
Problem: Discharge Progression Outcomes Goal: Discharge plan in place and appropriate Individualization of Care Outcome: Not Progressing From home lives alone, sister in and out of home, uses a cane and walker PRN at home with ambulation, high fall risk, recent falls, direct admit from [employer] office, history of back surgeries, history of HTN controlled by home meds Goal: Other Discharge Outcomes/Goals Plan of Care Progress to Goal:  Pt noted with cellulitis on both legs, applied ointment per orders, pt changed to po antibiotics. No c/o pain at this time

## 2015-01-25 NOTE — Progress Notes (Signed)
[facility] [place] NAME: Kayla Galloway    MR#:  [id number]  DATE OF BIRTH:  11-04-1920  SUBJECTIVE:  CHIEF COMPLAINT:  No chief complaint on file.  Pain LLE. Afebrile. REVIEW OF SYSTEMS:    Review of Systems  Constitutional: Positive for malaise/fatigue. Negative for fever and chills.  HENT: Negative for sore throat.   Eyes: Negative for blurred vision, double vision and pain.  Respiratory: Negative for cough, hemoptysis, shortness of breath and wheezing.   Cardiovascular: Negative for chest pain, palpitations, orthopnea and leg swelling.  Gastrointestinal: Negative for heartburn, nausea, vomiting, abdominal pain, diarrhea and constipation.  Genitourinary: Negative for dysuria and hematuria.  Musculoskeletal: Negative for back pain and joint pain.  Skin: Negative for rash.  Neurological: Positive for weakness. Negative for sensory change, speech change, focal weakness and headaches.  Endo/Heme/Allergies: Does not bruise/bleed easily.  Psychiatric/Behavioral: Negative for depression. The patient is not nervous/anxious.     DRUG ALLERGIES:   Allergies  Allergen Reactions  . Cefuroxime Axetil Rash    Pt states that she does fine with Keflex.    . Doxycycline Nausea Only and Other (See Comments)    Reaction:  Weight loss   . Advil [Ibuprofen] Swelling  . Celebrex [Celecoxib] Nausea Only  . Daypro [Oxaprozin] Other (See Comments)    Reaction:  Dizziness   . Etodolac Nausea Only  . Macrobid [employer] Macro] Other (See Comments)    Reaction:  Burning   . Penicillins Other (See Comments)    Reaction:  Burning   . Percocet [Oxycodone-Acetaminophen] Nausea Only and Swelling  . Tramadol Other (See Comments)    Reaction:  Unknown   . Valium [Diazepam] Other (See Comments)    Reaction:  Dizziness    . Neosporin [Neomycin-Bacitracin Zn-Polymyx] Rash  . Vicodin [Hydrocodone-Acetaminophen] Nausea Only     VITALS:  Blood pressure 118/48, pulse 71, temperature 97.6 F (36.4 C), temperature source Oral, resp. rate 18, height 5\' 2"  (1.575 m), weight 58.378 kg (128 lb 11.2 oz), SpO2 97 %.  PHYSICAL EXAMINATION:   Physical Exam  GENERAL:  81 y.o.-year-old patient lying in the bed with no acute distress.  EYES: Pupils equal, round, reactive to light and accommodation. No scleral icterus. Extraocular muscles intact.  HEENT: Head atraumatic, normocephalic. Oropharynx and nasopharynx clear.  NECK:  Supple, no jugular venous distention. No thyroid enlargement, no tenderness.  LUNGS: Normal breath sounds bilaterally, no wheezing, rales, rhonchi. No use of accessory muscles of respiration.  CARDIOVASCULAR: S1, S2 normal. No murmurs, rubs, or gallops.  ABDOMEN: Soft, nontender, nondistended. Bowel sounds present. No organomegaly or mass.  EXTREMITIES: LLE swelling/redness. NEUROLOGIC: Cranial nerves II through XII are intact. No focal Motor or sensory deficits b/l.   PSYCHIATRIC: The patient is alert and oriented x 3.  SKIN: No obvious rash, lesion, or ulcer.    LABORATORY PANEL:   CBC  Recent Labs Lab 01/25/15 0511  WBC 4.9  HGB 9.3*  HCT 27.5*  PLT 182   ------------------------------------------------------------------------------------------------------------------  Chemistries   Recent Labs Lab 01/24/15 1517 01/25/15 0511  NA 133* 134*  K 4.2 4.3  CL 101 104  CO2 23 25  GLUCOSE 175* 83  BUN 26* 25*  CREATININE 1.45* 1.25*  CALCIUM 8.8* 8.3*  MG 1.8  --   AST 26  --   ALT 16  --   ALKPHOS 54  --   BILITOT 0.6  --    ------------------------------------------------------------------------------------------------------------------  Cardiac Enzymes  No results for input(s): TROPONINI in the last 168 hours. ------------------------------------------------------------------------------------------------------------------  RADIOLOGY:  No results  found.   ASSESSMENT AND PLAN:   81 year old female with past medical history osteoporosis, hyperlipidemia, GERD, history of previous DVT, history of hypertension, skin cancer, peripheral vascular disease, who presented to the hospital due to left lower extremity redness and pain and noted to have a recurrent  * Left lower extremity cellulitis Recurrent. Started on IV clindamycin ID consult Await cx results  * Left femoral vein DVT On Eliquis  * hypertension-continue ramipril  * osteoporosis-continue calcium and vitamin D supplements  * depression continue Zoloft  * GERD-continue Protonix   All the records are reviewed and case discussed with Care Management/Social Workerr. Management plans discussed with the patient, family and they are in agreement.  CODE STATUS: FULL CODE  DVT Prophylaxis: SCDs  TOTAL TIME TAKING CARE OF THIS PATIENT: 35 minutes.   POSSIBLE D/C IN 2-3 DAYS, DEPENDING ON CLINICAL CONDITION.   Hillary Bow R M.D on 01/25/2015 at 12:50 PM  Between 7am to 6pm - Pager - [phone]  After 6pm go to [email or web address] - password [email or web address] [facility]  Office  [phone]  CC: Primary care physician; [name], MD

## 2015-01-25 NOTE — Plan of Care (Signed)
Problem: Discharge Progression Outcomes Goal: Barriers To Progression Addressed/Resolved Individualization Outcome: Progressing From home lives alone, sister in and out of home, uses a cane and walker PRN at home with ambulation, high fall risk, recent falls, direct admit from [employer] office, history of back surgeries, history of HTN controlled by home meds    Goal: Other Discharge Outcomes/Goals Outcome: Progressing Plan of care progress to goal Pain: No pain meds given so far this shift. Pt did have Norco during the day. Activity appropriate: Pt up with assist to bathroom. Wound improving/decrease: Pt on clindamycin q 6 hrs. Left leg elevated on 5 pillows. I&D consult performed. Atarax given once for itch.

## 2015-01-25 NOTE — Care Management (Signed)
Admitted to [facility] with the diagnosis of cellulitis and foot abscess. Discharged from this facility 01/18/15. Lives alone.  Family is [employer]. [phone]). Followed by [place] in the home. Sees Dr. [name]. Shelbie Ammons RN MSN Care Management [phone]

## 2015-01-26 ENCOUNTER — Telehealth: Payer: Self-pay

## 2015-01-26 DIAGNOSIS — I82409 Acute embolism and thrombosis of unspecified deep veins of unspecified lower extremity: Secondary | ICD-10-CM | POA: Diagnosis present

## 2015-01-26 DIAGNOSIS — N179 Acute kidney failure, unspecified: Secondary | ICD-10-CM | POA: Diagnosis present

## 2015-01-26 LAB — CREATININE, SERUM
Creatinine, Ser: 1.09 mg/dL — ABNORMAL HIGH (ref 0.44–1.00)
GFR calc Af Amer: 50 mL/min — ABNORMAL LOW (ref >60)
GFR, EST NON AFRICAN AMERICAN: 43 mL/min — AB (ref >60)

## 2015-01-26 MED ORDER — APIXABAN 5 MG PO TABS: 5.0000 mg | ORAL_TABLET | Freq: Two times a day (BID) | ORAL | Status: DC

## 2015-01-26 MED ORDER — HYDROXYZINE HCL 10 MG PO TABS: 10.0000 mg | ORAL_TABLET | Freq: Three times a day (TID) | ORAL | Status: DC | PRN

## 2015-01-26 MED ORDER — CLINDAMYCIN HCL 300 MG PO CAPS: 300.0000 mg | ORAL_CAPSULE | Freq: Three times a day (TID) | ORAL | Status: DC

## 2015-01-26 NOTE — Plan of Care (Signed)
Problem: Discharge Progression Outcomes Goal: Other Discharge Outcomes/Goals Outcome: Progressing Plan of Care Progress to Goals: Hemo: Pt alert and oriented. PO abx continue for cellulitis.  Pain: No complaints of pain. Pt does however complain of itching. PRN medication given with some relief noted.  Activity:  Pt up in room with stand-by assist

## 2015-01-26 NOTE — Telephone Encounter (Signed)
FYI

## 2015-01-26 NOTE — Care Management (Signed)
Spoke with Dr. Darvin Neighbours. Will be discharging Ms. Leveille to home today. Discussed resumption of orders due to the fact that Ms. Bibby is being following by [place] [facility]. Family/friends will transport. Shelbie Ammons RN MSN Care Management [phone]

## 2015-01-26 NOTE — Evaluation (Signed)
Physical Therapy Evaluation Patient Details Name: Kayla Galloway MRN: [id number] DOB: 24-Aug-1920 Today's Date: 01/26/2015   History of Present Illness  Patient is a 81 y/o female with a history of HTN, HLD, and left leg injury in November 2015 who has been follow by the wound center for LE wounds until 12/21/2014. Patient has a history of a known DVT in her LLE, with an IVC filter in place. She presnts on this admission with increasing cellulitis in her LLE and weaping drainage.   Clinical Impression  Patient is a 81 y/o female that presents from home, that was discharged from [facility] Acute PT without any PT needs and is essentially at baseline currently. She has not ambulated as much as normal during this visit, and appears to be a higher risk for falls (decreased functional strength) with a modified DGI of 8/12 without an assistive device. Patient appears much safer with RW with improved gait speed and longer strides. She has a known DVT in her LLE with IVC filter in place, but there is notable weepage and tenderness along her anterior tibial region on her LLE. Patient appears very near her baseline, with minimal functional strength deficits which will be addressed by skilled acute PT.     Follow Up Recommendations No PT follow up    Equipment Recommendations       Recommendations for Other Services       Precautions / Restrictions Precautions Precautions: Fall Restrictions Weight Bearing Restrictions: No      Mobility  Bed Mobility Overal bed mobility: Independent             General bed mobility comments: Patient observed transferring from sit to supine with no assistance.   Transfers Overall transfer level: Modified independent Equipment used: Rolling walker (2 wheeled)             General transfer comment: Patient transfers sit <--> stand with RW safely, with no safety cuing.   Ambulation/Gait Ambulation/Gait assistance: Modified independent (Device/Increase  time) Ambulation Distance (Feet): 200 Feet (Patient ambulated 200' without RW and 200' with RW. ) Assistive device: Rolling walker (2 wheeled) Gait Pattern/deviations: Decreased step length - right;Decreased step length - left;Trunk flexed;Narrow base of support     General Gait Details: Patient was able to complete a lap around the RN station without RW, but displayed a modified DGI of 8/12 indicating she was at high risk for falling. Patient provided RW and greatly improved gait speed and minimized lateral sway that was noted without AD. Patient initially took slow reciprocal steps without significant step length without AD, which increased with RW.  Stairs            Wheelchair Mobility    Modified Rankin (Stroke Patients Only)       Balance Overall balance assessment: Modified Independent (with RW )                               Standardized Balance Assessment Standardized Balance Assessment :  (Modified DGI w/o RW 8/12 indicating high falls risk )           Pertinent Vitals/Pain Pain Assessment:  (Patient does not rate any pain today, and is clearly not limited by any pain during ambulation. ) Pain Intervention(s): Monitored during session    Home Living Family/patient expects to be discharged to:: Private residence Living Arrangements: Alone Available Help at Discharge: Family Type of Home: House Home Access: Stairs  to enter Entrance Stairs-Rails: Right Entrance Stairs-Number of Steps: 2 steps in back with 1 rail Home Layout: One level Home Equipment: Walker - 2 wheels;Cane - single point      Prior Function Level of Independence: Independent         Comments: Was walking in home without AD; independent in all ADLs; would use cane/walker when walking outside of home     Hand Dominance   Dominant Hand: Left    Extremity/Trunk Assessment   Upper Extremity Assessment: Overall WFL for tasks assessed           Lower Extremity  Assessment: Overall WFL for tasks assessed (Left hip flexion 4/5, left knee flexion 4/5, left knee extension 4+/5 )      Cervical / Trunk Assessment: Kyphotic  Communication   Communication: No difficulties  Cognition Arousal/Alertness: Awake/alert Behavior During Therapy: WFL for tasks assessed/performed Overall Cognitive Status: Within Functional Limits for tasks assessed                      General Comments      Exercises        Assessment/Plan    PT Assessment Patent does not need any further PT services;Patient needs continued PT services  PT Diagnosis Generalized weakness;Difficulty walking   PT Problem List Decreased strength;Decreased balance  PT Treatment Interventions Gait training;Therapeutic exercise;Balance training   PT Goals (Current goals can be found in the Care Plan section) Acute Rehab PT Goals Patient Stated Goal: to go home soon PT Goal Formulation: With patient Time For Goal Achievement: 02/09/15 Potential to Achieve Goals: Good    Frequency Min 2X/week   Barriers to discharge        Co-evaluation               End of Session Equipment Utilized During Treatment: Gait belt Activity Tolerance: Patient tolerated treatment well Patient left: in bed;with call bell/phone within reach;with bed alarm set Nurse Communication: Mobility status         Time: 1415-1430 PT Time Calculation (min) (ACUTE ONLY): 15 min   Charges:   PT Evaluation $Initial PT Evaluation Tier I: 1 Procedure     PT G Codes:       Kerman Passey, PT, DPT   01/26/2015, 2:54 PM

## 2015-01-26 NOTE — Progress Notes (Signed)
Date of Admission:  01/24/2015     ID: Kayla Galloway is a 81 y.o. female with  cellulitis  Active Problems:   Cellulitis and abscess of foot   Subjective: No fevers, less redness  Medications:  . apixaban  5 mg Oral BID  . clindamycin  300 mg Oral 3 times per day  . docusate sodium  100 mg Oral BID  . furosemide  20 mg Oral Daily  . HYDROcodone-acetaminophen  1 tablet Oral Daily  . pantoprazole  40 mg Oral Daily  . ramipril  5 mg Oral Daily  . sertraline  50 mg Oral Daily  . sodium chloride  3 mL Intravenous Q12H  . white petrolatum   Topical Daily    Objective: Vital signs in last 24 hours: Temp:  [97.6 F (36.4 C)-98 F (36.7 C)] 98 F (36.7 C) (06/10 0456) Pulse Rate:  [66-77] 76 (06/10 0456) Resp:  [17-18] 18 (06/10 0456) BP: (115-150)/(47-60) 140/47 mmHg (06/10 0456) SpO2:  [96 %-100 %] 96 % (06/10 0456) Weight:  [56.291 kg (124 lb 1.6 oz)] 56.291 kg (124 lb 1.6 oz) (06/10 0500) Constitutional: Chronically ill appearing oriented  HENT: [place]/AT, PERRLA, no scleral icterus Mouth/Throat: Oropharynx is clear and moist. No oropharyngeal exudate.  Cardiovascular: Normal rate, regular rhythm and normal heart sounds.1/6 sm Pulmonary/Chest: Effort normal and breath sounds normal. No respiratory distress. has no wheezes.  Neck - supple, no nuchal rigidity Abdominal: Soft. Bowel sounds are normal. exhibits no distension. There is no tenderness.  Lymphadenopathy: no cervical adenopathy. No axillary adenopathy Neurological: alert and oriented to person, place, and time.  EXT - 2 + RLE edema, 1 + LLE Skin: LLE with splotchy mild  erythema to knee, warm tender. No drainage. Has a scar lateral calf of old healed wound but no drainage. RLE with mod erythema to above ankle Psychiatric: a normal mood and affect. behavior is normal.   Lab Results  Recent Labs  01/24/15 1517 01/25/15 0511  WBC 5.3 4.9  HGB 10.0* 9.3*  HCT 30.8* 27.5*  NA 133* 134*  K 4.2 4.3  CL 101  104  CO2 23 25  BUN 26* 25*  CREATININE 1.45* 1.25*   Liver Panel  Recent Labs  01/24/15 1517  PROT 6.8  ALBUMIN 3.8  AST 26  ALT 16  ALKPHOS 75  BILITOT 0.6   Microbiology: Results for orders placed or performed during the hospital encounter of 01/24/15  Culture, blood (routine x 2)     Status: None (Preliminary result)   Collection Time: 01/24/15  3:16 PM  Result Value Ref Range Status   Specimen Description BLOOD  Final   Special Requests Normal  Final   Culture NO GROWTH < 24 HOURS  Final   Report Status PENDING  Incomplete  Culture, blood (routine x 2)     Status: None (Preliminary result)   Collection Time: 01/24/15  3:16 PM  Result Value Ref Range Status   Specimen Description BLOOD  Final   Special Requests Normal  Final   Culture NO GROWTH < 24 HOURS  Final   Report Status PENDING  Incomplete    Studies/Results:  No results found.  Assessment/Plan: Kayla Galloway is a 81 y.o. female with a known history of hypertension, hyperlipidemia and left leg injury in November 2015 who had been followed by the wound Center for nonhealing wounds until 12/21/2014 recently admitted with LLE cellulitis and dced on bactrim. Recurrent infection lead to readmission. She has a healed  wound on LLE that does not have any evidence of abscess or purulence. She is allergic to PCN in terms of causing "burning on her bottom". Has cephalosporin allergy listed as rash This is almost certainly Group strep infection. No evidence of purulence to suggest MRSA. Bactrim on which she was dced, does not cover Group strep very well. Much improved with clindamycin and elevation  Recommendations Changed clindamycin to oral 6/8 and tolerating well.  Continue elevate leg on 6 pillows - this is the most important intervention at this poin. Once stable from other issues can go home on oral clindamycin for 10 total days - can fu with PCP and I can see if needed - will need emolient to skin on  legs given dry skin and some chronic venous stasis dermatitis - continue  vaseline bid after dc(ordered) I will sign off but please call with questions  [place], DAVID   01/26/2015, 8:05 AM

## 2015-01-26 NOTE — Progress Notes (Signed)
Patient discharged home via personal vehicle with home health.  Patient much improved with new antibiotics prescribed.  All discharge instructions reviewed and discharged instructions signed.

## 2015-01-26 NOTE — Progress Notes (Signed)
ANTICOAGULATION CONSULT NOTE - Initial Consult  Pharmacy Consult for Apixaban Indication: DVT  Allergies  Allergen Reactions  . Cefuroxime Axetil Rash    Pt states that she does fine with Keflex.    . Doxycycline Nausea Only and Other (See Comments)    Reaction:  Weight loss   . Advil [Ibuprofen] Swelling  . Celebrex [Celecoxib] Nausea Only  . Daypro [Oxaprozin] Other (See Comments)    Reaction:  Dizziness   . Etodolac Nausea Only  . Macrobid [employer] Macro] Other (See Comments)    Reaction:  Burning   . Penicillins Other (See Comments)    Reaction:  Burning   . Percocet [Oxycodone-Acetaminophen] Nausea Only and Swelling  . Tramadol Other (See Comments)    Reaction:  Unknown   . Valium [Diazepam] Other (See Comments)    Reaction:  Dizziness    . Neosporin [Neomycin-Bacitracin Zn-Polymyx] Rash  . Vicodin [Hydrocodone-Acetaminophen] Nausea Only    Patient Measurements: Height: 5\' 2"  (157.5 cm) Weight: 124 lb 1.6 oz (56.291 kg) IBW/kg (Calculated) : 50.1   Vital Signs: Temp: 98 F (36.7 C) (06/10 0456) Temp Source: Oral (06/10 0456) BP: 140/47 mmHg (06/10 0456) Pulse Rate: 76 (06/10 0456)  Labs:  Recent Labs  01/24/15 1517 01/25/15 0511  HGB 10.0* 9.3*  HCT 30.8* 27.5*  PLT 206 182  APTT 27  --   LABPROT 13.4  --   INR 1.00  --   CREATININE 1.45* 1.25*    Estimated Creatinine Clearance: 23.2 mL/min (by C-G formula based on Cr of 1.25).   Medical History: Past Medical History  Diagnosis Date  . Arthritis   . Osteoporosis   . Hypercholesterolemia   . Anemia   . GERD (gastroesophageal reflux disease)   . DVT (deep venous thrombosis), left     bilateral, IVC filter 2010  . Depression   . Gout   . Nephrolithiasis   . 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 Renal vein thrombosis     previous renal insufficiency  . Hypertension     Dr. [name]  . Cancer     skin ca lesion of scalp-  removed  . Peripheral vascular disease   . Spider veins     Medications:  Prescriptions prior to admission  Medication Sig Dispense Refill Last Dose  . acetaminophen (TYLENOL) 650 MG CR tablet Take 650 mg by mouth every 8 (eight) hours as needed for pain.    Past Week at Unknown time  . bifidobacterium infantis (ALIGN) capsule Take 1 capsule by mouth daily. 30 capsule 0 01/23/2015 at Unknown time  . Calcium Carb-Cholecalciferol (CALCIUM 600 + D PO) Take 1 tablet by mouth 3 (three) times daily.   01/24/2015 at Unknown time  . furosemide (LASIX) 20 MG tablet Take 1 tablet (20 mg total) by mouth daily. (Patient taking differently: Take 20 mg by mouth daily. ) 30 tablet 5 01/23/2015 at Unknown time  . hydrocerin (EUCERIN) CREA Apply 1 application topically 2 (two) times daily. 113 g 1 01/24/2015 at Unknown time  . HYDROcodone-acetaminophen (NORCO/VICODIN) 5-325 MG per tablet Take 1 tablet by mouth daily.    01/24/2015 at Unknown time  . Misc Natural Products (OSTEO BI-FLEX JOINT SHIELD PO) Take 1 tablet by mouth 2 (two) times daily.   01/24/2015 at Unknown time  . omeprazole (PRILOSEC) 20 MG capsule Take 1 capsule by mouth  daily (Patient taking differently: Take 20 mg by mouth daily. )  90 capsule 0 01/24/2015 at Unknown time  . ramipril (ALTACE) 5 MG capsule Take 1 capsule by mouth  daily (Patient taking differently: Take 5 mg by mouth daily. ) 90 capsule 0 01/24/2015 at Unknown time  . sertraline (ZOLOFT) 50 MG tablet Take 1 tablet by mouth  daily (Patient taking differently: Take 50 mg by mouth daily. ) 90 tablet 0 01/24/2015 at Unknown time  . sulfamethoxazole-trimethoprim (BACTRIM DS,SEPTRA DS) 800-160 MG per tablet Take 1 tablet by mouth 2 (two) times daily. 20 tablet 0 01/24/2015 at Unknown time    Assessment: Patient is a 81 yo female with non-occlusive deep venous thrombosis.    Est CrCl~23.2 mL/min   Patients with CrCl<25 mL/min or SCr >2.5 were excluded from studies evaluating apixaban in the treatment  for DVT.  Spoke with MD and would like to continue current management with Apixaban 5 mg po BID with monitoring of SCr.     Goal of Therapy:  DVT treatment  Plan:  Will continue Apixaban 5 mg PO BID. If no improvement in renal function, may consider transitioning patient to warfarin for treatment of DVT.  Murrell Converse, PharmD Clinical Pharmacist 01/26/2015

## 2015-01-26 NOTE — Telephone Encounter (Signed)
I can see her at 12:00 on 02/01/15 for hospital follow up.

## 2015-01-26 NOTE — Discharge Instructions (Addendum)
°  DIET:  Cardiac diet  DISCHARGE CONDITION:  Stable  ACTIVITY:  Activity as tolerated  OXYGEN:  Home Oxygen: No.   Oxygen Delivery: room air  DISCHARGE LOCATION:  home   If you experience worsening of your admission symptoms, develop shortness of breath, life threatening emergency, suicidal or homicidal thoughts you must seek medical attention immediately by calling 911 or calling your MD immediately  if symptoms less severe.  You Must read complete instructions/literature along with all the possible adverse reactions/side effects for all the Medicines you take and that have been prescribed to you. Take any new Medicines after you have completely understood and accpet all the possible adverse reactions/side effects.   Please note  You were cared for by a hospitalist during your hospital stay. If you have any questions about your discharge medications or the care you received while you were in the hospital after you are discharged, you can call the unit and asked to speak with the hospitalist on call if the hospitalist that took care of you is not available. Once you are discharged, your primary care physician will handle any further medical issues. Please note that NO REFILLS for any discharge medications will be authorized once you are discharged, as it is imperative that you return to your primary care physician (or establish a relationship with a primary care physician if you do not have one) for your aftercare needs so that they can reassess your need for medications and monitor your lab values.   [place]

## 2015-01-26 NOTE — Discharge Summary (Signed)
[place] [facility] [place] NAME: Kayla Galloway    MR#:  [id number]  DATE OF BIRTH:  10-01-1920  DATE OF ADMISSION:  01/24/2015 ADMITTING PHYSICIAN: Max Sane, MD  DATE OF DISCHARGE: No discharge date for patient encounter.  PRIMARY CARE PHYSICIAN: [name], MD    ADMISSION DIAGNOSIS:  Cellulitis Lt Leg  DISCHARGE DIAGNOSIS:  Active Problems:   Cellulitis and abscess of foot   DVT (deep venous thrombosis)   ARF (acute renal failure)   SECONDARY DIAGNOSIS:   Past Medical History  Diagnosis Date  . Arthritis   . Osteoporosis   . Hypercholesterolemia   . Anemia   . GERD (gastroesophageal reflux disease)   . DVT (deep venous thrombosis), left     bilateral, IVC filter 2010  . Depression   . Gout   . Nephrolithiasis   . 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 Renal vein thrombosis     previous renal insufficiency  . Hypertension     Dr. [name]  . Cancer     skin ca lesion of scalp- removed  . Peripheral vascular disease   . Spider veins      ADMITTING HISTORY  Kayla Galloway is a 81 y.o. female with a known history of hypertension, hyperlipidemia and gout left leg injury in November 2015 who had been followed by the wound Center for nonhealing wounds until 12/21/2014 was sent in from her primary care physician's office with worsening swelling, erythema, pain in the left leg and also now right leg. She has not had any fevers or chills. She has not had any change in her appetite no nausea vomiting diarrhea or weakness. She also reports weeping drainage from lower extremity cellulitis. Patient was recently discharged on oral Bactrim with 10 days of course, but as per her primary physician . Patient's wound Getting worse and now she has in the right lower extremity also for which she is pain admitted for further evaluation and management.   HOSPITAL COURSE:    81 year old female with past medical history osteoporosis, hyperlipidemia, GERD, history of previous DVT, history of hypertension, skin cancer, peripheral vascular disease, who presented to the hospital due to left lower extremity redness and pain and noted to have a recurrent  * Left lower extremity cellulitis ID consulted. Recommended clindamycin PO Cultures are negative  * Left femoral vein DVT On Eliquis Follow up with PCP. This was provoked likely from immobility and inflammation from celluitis. Treat for 3 months and stop.  * ARF due to infection Resolved. Creatinine back to normal  * hypertension-continue ramipril  * osteoporosis-continue calcium and vitamin D supplements  * depression continue Zoloft  * GERD-continue Protonix  Stable at time of discharge  CONSULTS OBTAINED:  Treatment Team:  Adrian Prows, MD  DRUG ALLERGIES:   Allergies  Allergen Reactions  . Cefuroxime Axetil Rash    Pt states that she does fine with Keflex.    . Doxycycline Nausea Only and Other (See Comments)    Reaction:  Weight loss   . Advil [Ibuprofen] Swelling  . Celebrex [Celecoxib] Nausea Only  . Daypro [Oxaprozin] Other (See Comments)    Reaction:  Dizziness   . Etodolac Nausea Only  . Macrobid [employer] Macro] Other (See Comments)    Reaction:  Burning   . Penicillins Other (See Comments)    Reaction:  Burning   . Percocet [Oxycodone-Acetaminophen] Nausea Only  and Swelling  . Tramadol Other (See Comments)    Reaction:  Unknown   . Valium [Diazepam] Other (See Comments)    Reaction:  Dizziness    . Neosporin [Neomycin-Bacitracin Zn-Polymyx] Rash  . Vicodin [Hydrocodone-Acetaminophen] Nausea Only    DISCHARGE MEDICATIONS:   Current Discharge Medication List    START taking these medications   Details  apixaban (ELIQUIS) 5 MG TABS tablet Take 1 tablet (5 mg total) by mouth 2 (two) times daily. Qty: 60 tablet, Refills: 0    clindamycin (CLEOCIN) 300  MG capsule Take 1 capsule (300 mg total) by mouth 3 (three) times daily. Qty: 27 capsule, Refills: 0    hydrOXYzine (ATARAX/VISTARIL) 10 MG tablet Take 1 tablet (10 mg total) by mouth 3 (three) times daily as needed for itching. Qty: 30 tablet, Refills: 0      CONTINUE these medications which have NOT CHANGED   Details  acetaminophen (TYLENOL) 650 MG CR tablet Take 650 mg by mouth every 8 (eight) hours as needed for pain.     bifidobacterium infantis (ALIGN) capsule Take 1 capsule by mouth daily. Qty: 30 capsule, Refills: 0    Calcium Carb-Cholecalciferol (CALCIUM 600 + D PO) Take 1 tablet by mouth 3 (three) times daily.    furosemide (LASIX) 20 MG tablet Take 1 tablet (20 mg total) by mouth daily. Qty: 30 tablet, Refills: 5    hydrocerin (EUCERIN) CREA Apply 1 application topically 2 (two) times daily. Qty: 113 g, Refills: 1    HYDROcodone-acetaminophen (NORCO/VICODIN) 5-325 MG per tablet Take 1 tablet by mouth daily.     Misc Natural Products (OSTEO BI-FLEX JOINT SHIELD PO) Take 1 tablet by mouth 2 (two) times daily.    omeprazole (PRILOSEC) 20 MG capsule Take 1 capsule by mouth  daily Qty: 90 capsule, Refills: 0    ramipril (ALTACE) 5 MG capsule Take 1 capsule by mouth  daily Qty: 90 capsule, Refills: 0    sertraline (ZOLOFT) 50 MG tablet Take 1 tablet by mouth  daily Qty: 90 tablet, Refills: 0    sulfamethoxazole-trimethoprim (BACTRIM DS,SEPTRA DS) 800-160 MG per tablet Take 1 tablet by mouth 2 (two) times daily. Qty: 20 tablet, Refills: 0        Today    VITAL SIGNS:  Blood pressure 114/47, pulse 72, temperature 97 F (36.1 C), temperature source Axillary, resp. rate 20, height 5\' 2"  (1.575 m), weight 56.291 kg (124 lb 1.6 oz), SpO2 100 %.  I/O:   Intake/Output Summary (Last 24 hours) at 01/26/15 1420 Last data filed at 01/26/15 1200  Gross per 24 hour  Intake    720 ml  Output   1375 ml  Net   -655 ml    PHYSICAL EXAMINATION:  Physical Exam  GENERAL:   81 y.o.-year-old patient lying in the bed with no acute distress.  LUNGS: Normal breath sounds bilaterally, no wheezing, rales,rhonchi or crepitation. No use of accessory muscles of respiration.  CARDIOVASCULAR: S1, S2 normal. No murmurs, rubs, or gallops.  ABDOMEN: Soft, non-tender, non-distended. Bowel sounds present. No organomegaly or mass.  NEUROLOGIC: Moves all 4 extremities. PSYCHIATRIC: The patient is alert and oriented x 3.  SKIN: No obvious rash, lesion, or ulcer. Redness LLE much improved  DATA REVIEW:   CBC  Recent Labs Lab 01/25/15 0511  WBC 4.9  HGB 9.3*  HCT 27.5*  PLT 182    Chemistries   Recent Labs Lab 01/24/15 1517 01/25/15 0511 01/26/15 0903  NA 133* 134*  --  K 4.2 4.3  --   CL 101 104  --   CO2 23 25  --   GLUCOSE 175* 83  --   BUN 26* 25*  --   CREATININE 1.45* 1.25* 1.09*  CALCIUM 8.8* 8.3*  --   MG 1.8  --   --   AST 26  --   --   ALT 16  --   --   ALKPHOS 54  --   --   BILITOT 0.6  --   --     Cardiac Enzymes No results for input(s): TROPONINI in the last 168 hours.  Microbiology Results  Results for orders placed or performed during the hospital encounter of 01/24/15  Culture, blood (routine x 2)     Status: None (Preliminary result)   Collection Time: 01/24/15  3:16 PM  Result Value Ref Range Status   Specimen Description BLOOD  Final   Special Requests Normal  Final   Culture NO GROWTH 2 DAYS  Final   Report Status PENDING  Incomplete  Culture, blood (routine x 2)     Status: None (Preliminary result)   Collection Time: 01/24/15  3:16 PM  Result Value Ref Range Status   Specimen Description BLOOD  Final   Special Requests Normal  Final   Culture NO GROWTH 2 DAYS  Final   Report Status PENDING  Incomplete    RADIOLOGY:  No results found.    Follow up with PCP in 1 week.  Management plans discussed with the patient, family and they are in agreement.  CODE STATUS:     Code Status Orders        Start      Ordered   01/24/15 1431  Full code   Continuous     01/24/15 1430    Advance Directive Documentation        Most Recent Value   Type of Advance Directive  Healthcare Power of Attorney, Living will   Pre-existing out of facility DNR order (yellow form or pink MOST form)     "MOST" Form in Place?        TOTAL TIME TAKING CARE OF THIS PATIENT ON DAY OF DISCHARGE: more than 30  minutes.    Hillary Bow R M.D on 01/26/2015 at 2:20 PM  Between 7am to 6pm - Pager - [phone]  After 6pm go to [email or web address] - password [email or web address] [facility]  Office  [phone]  CC: Primary care physician; [name], MD

## 2015-01-26 NOTE — Telephone Encounter (Signed)
The pt is scheduled for her hospital follow up (d/c today - 6/10) with C.Doss next week. Thanks!

## 2015-01-27 DIAGNOSIS — D649 Anemia, unspecified: Secondary | ICD-10-CM | POA: Diagnosis not present

## 2015-01-27 DIAGNOSIS — I10 Essential (primary) hypertension: Secondary | ICD-10-CM | POA: Diagnosis not present

## 2015-01-27 DIAGNOSIS — L03116 Cellulitis of left lower limb: Secondary | ICD-10-CM | POA: Diagnosis not present

## 2015-01-27 DIAGNOSIS — E785 Hyperlipidemia, unspecified: Secondary | ICD-10-CM | POA: Diagnosis not present

## 2015-01-27 DIAGNOSIS — F329 Major depressive disorder, single episode, unspecified: Secondary | ICD-10-CM | POA: Diagnosis not present

## 2015-01-27 DIAGNOSIS — I739 Peripheral vascular disease, unspecified: Secondary | ICD-10-CM | POA: Diagnosis not present

## 2015-01-27 DIAGNOSIS — Z4800 Encounter for change or removal of nonsurgical wound dressing: Secondary | ICD-10-CM | POA: Diagnosis not present

## 2015-01-27 DIAGNOSIS — L03115 Cellulitis of right lower limb: Secondary | ICD-10-CM | POA: Diagnosis not present

## 2015-01-27 DIAGNOSIS — M109 Gout, unspecified: Secondary | ICD-10-CM | POA: Diagnosis not present

## 2015-01-27 DIAGNOSIS — K219 Gastro-esophageal reflux disease without esophagitis: Secondary | ICD-10-CM | POA: Diagnosis not present

## 2015-01-28 ENCOUNTER — Encounter: Payer: Self-pay | Admitting: Internal Medicine

## 2015-01-28 NOTE — Assessment & Plan Note (Signed)
Blood pressure has been under good control.  Follow.   

## 2015-01-28 NOTE — Assessment & Plan Note (Signed)
Increased pain and swelling.  On eliquis.  Pt being directly admitted for further treatment.

## 2015-01-28 NOTE — Progress Notes (Signed)
Patient ID: Kayla Galloway, female   DOB: 02-27-1921, 81 y.o.   MRN: [id number]   Subjective:    Patient ID: Kayla Galloway, female    DOB: September 23, 1920, 81 y.o.   MRN: [id number]  HPI  Patient here for a hospital follow up.  Was admitted 01/12/15 and discharged 01/15/15 with lower extremity cellulitis.  Was placed on IV abx.  Blood cultures negative.  Was discharged home on bactrim.  She is accompanied by her sister.  History obtained from both of them.  They report the redness has worsened after discharge.  Increased swelling.  Increased pain with weight bearing.  In reviewing, apparently she had DVT - found on ultrasound in the hospital.     Past Medical History  Diagnosis Date  . Arthritis   . Osteoporosis   . Hypercholesterolemia   . Anemia   . GERD (gastroesophageal reflux disease)   . DVT (deep venous thrombosis), left     bilateral, IVC filter 2010  . Depression   . Gout   . Nephrolithiasis   . 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 Renal vein thrombosis     previous renal insufficiency  . Hypertension     Dr. [name]  . Cancer     skin ca lesion of scalp- removed  . Peripheral vascular disease   . Spider veins     Current Outpatient Prescriptions on File Prior to Visit  Medication Sig Dispense Refill  . acetaminophen (TYLENOL) 650 MG CR tablet Take 650 mg by mouth every 8 (eight) hours as needed for pain.     . bifidobacterium infantis (ALIGN) capsule Take 1 capsule by mouth daily. 30 capsule 0  . Calcium Carb-Cholecalciferol (CALCIUM 600 + D PO) Take 1 tablet by mouth 3 (three) times daily.    . furosemide (LASIX) 20 MG tablet Take 1 tablet (20 mg total) by mouth daily. (Patient taking differently: Take 20 mg by mouth daily. ) 30 tablet 5  . hydrocerin (EUCERIN) CREA Apply 1 application topically 2 (two) times daily. 113 g 1  . HYDROcodone-acetaminophen (NORCO/VICODIN) 5-325 MG per tablet Take 1 tablet by mouth daily.      . Misc Natural Products (OSTEO BI-FLEX JOINT SHIELD PO) Take 1 tablet by mouth 2 (two) times daily.    Marland Kitchen omeprazole (PRILOSEC) 20 MG capsule Take 1 capsule by mouth  daily (Patient taking differently: Take 20 mg by mouth daily. ) 90 capsule 0  . ramipril (ALTACE) 5 MG capsule Take 1 capsule by mouth  daily (Patient taking differently: Take 5 mg by mouth daily. ) 90 capsule 0  . sertraline (ZOLOFT) 50 MG tablet Take 1 tablet by mouth  daily (Patient taking differently: Take 50 mg by mouth daily. ) 90 tablet 0  . sulfamethoxazole-trimethoprim (BACTRIM DS,SEPTRA DS) 800-160 MG per tablet Take 1 tablet by mouth 2 (two) times daily. 20 tablet 0   No current facility-administered medications on file prior to visit.    Review of Systems  Constitutional: Positive for fatigue. Negative for unexpected weight change.  HENT: Negative for congestion and sinus pressure.   Respiratory: Negative for cough, chest tightness and shortness of breath.   Cardiovascular: Positive for leg swelling. Negative for chest pain and palpitations.  Gastrointestinal: Negative for nausea, vomiting, abdominal pain and diarrhea.  Musculoskeletal:       Increased pain in her legs, especially with weight bearing.  Limits her walking and activity.  Skin: Positive for color change and rash.       Increased redness of lower extremities.  Involves both legs now.    Neurological: Negative for light-headedness and headaches.  Psychiatric/Behavioral: Negative for dysphoric mood and agitation.       Objective:    Physical Exam  HENT:  Nose: Nose normal.  Mouth/Throat: Oropharynx is clear and moist.  Neck: Neck supple.  Cardiovascular: Normal rate and regular rhythm.   Pulmonary/Chest: Breath sounds normal. No respiratory distress. She has no wheezes.  Abdominal: Soft. Bowel sounds are normal. There is no tenderness.  Musculoskeletal: She exhibits edema and tenderness.  Lymphadenopathy:    She has no cervical adenopathy.    Skin: Rash noted. There is erythema.    BP 124/55 mmHg  Pulse 74  Temp(Src) 97.7 F (36.5 C) (Oral)  Ht 5\' 2"  (1.575 m)  Wt 119 lb 4 oz (54.091 kg)  BMI 21.81 kg/m2  SpO2 100% Wt Readings from Last 3 Encounters:  01/26/15 124 lb 1.6 oz (56.291 kg)  01/24/15 119 lb 4 oz (54.091 kg)  01/12/15 123 lb (55.792 kg)     Lab Results  Component Value Date   WBC 4.9 01/25/2015   HGB 9.3* 01/25/2015   HCT 27.5* 01/25/2015   PLT 182 01/25/2015   GLUCOSE 83 01/25/2015   CHOL 229* 07/12/2012   TRIG 87.0 07/12/2012   HDL 86.30 07/12/2012   LDLDIRECT 135.9 07/12/2012   ALT 16 01/24/2015   AST 26 01/24/2015   NA 134* 01/25/2015   K 4.3 01/25/2015   CL 104 01/25/2015   CREATININE 1.09* 01/26/2015   BUN 25* 01/25/2015   CO2 25 01/25/2015   TSH 1.72 04/10/2014   INR 1.00 01/24/2015       Assessment & Plan:   Problem List Items Addressed This Visit    Cellulitis    Cellulitis of both legs.  Increased swelling and pain.  Redness and swelling worsening.  Hospitalist notified.  Agreed to accept pt for direct admission.  Pt transferred to the hospital by her sister.        DVT (deep venous thrombosis) - Primary    Increased pain and swelling.  On eliquis.  Pt being directly admitted for further treatment.        Hypertension    Blood pressure has been under good control.  Follow.            [name], MD

## 2015-01-28 NOTE — Assessment & Plan Note (Signed)
Cellulitis of both legs.  Increased swelling and pain.  Redness and swelling worsening.  Hospitalist notified.  Agreed to accept pt for direct admission.  Pt transferred to the hospital by her sister.

## 2015-01-29 ENCOUNTER — Emergency Department: Payer: Medicare Other

## 2015-01-29 ENCOUNTER — Telehealth: Payer: Self-pay | Admitting: Internal Medicine

## 2015-01-29 ENCOUNTER — Other Ambulatory Visit: Payer: Self-pay

## 2015-01-29 ENCOUNTER — Encounter: Payer: Self-pay | Admitting: Emergency Medicine

## 2015-01-29 ENCOUNTER — Emergency Department
Admission: EM | Admit: 2015-01-29 | Discharge: 2015-01-29 | Disposition: A | Discharge status: Home / Self Care | Payer: Medicare Other | Attending: Emergency Medicine | Admitting: Emergency Medicine

## 2015-01-29 DIAGNOSIS — W19XXXA Unspecified fall, initial encounter: Secondary | ICD-10-CM | POA: Diagnosis not present

## 2015-01-29 DIAGNOSIS — Y92009 Unspecified place in unspecified non-institutional (private) residence as the place of occurrence of the external cause: Secondary | ICD-10-CM | POA: Diagnosis not present

## 2015-01-29 DIAGNOSIS — Z880 Allergy status to penicillin: Secondary | ICD-10-CM | POA: Diagnosis not present

## 2015-01-29 DIAGNOSIS — Y998 Other external cause status: Secondary | ICD-10-CM | POA: Insufficient documentation

## 2015-01-29 DIAGNOSIS — R209 Unspecified disturbances of skin sensation: Secondary | ICD-10-CM | POA: Diagnosis not present

## 2015-01-29 DIAGNOSIS — R0789 Other chest pain: Secondary | ICD-10-CM | POA: Diagnosis not present

## 2015-01-29 DIAGNOSIS — W1839XA Other fall on same level, initial encounter: Secondary | ICD-10-CM | POA: Diagnosis not present

## 2015-01-29 DIAGNOSIS — G8929 Other chronic pain: Secondary | ICD-10-CM | POA: Insufficient documentation

## 2015-01-29 DIAGNOSIS — I10 Essential (primary) hypertension: Secondary | ICD-10-CM | POA: Diagnosis not present

## 2015-01-29 DIAGNOSIS — Z7902 Long term (current) use of antithrombotics/antiplatelets: Secondary | ICD-10-CM | POA: Diagnosis not present

## 2015-01-29 DIAGNOSIS — S0181XA Laceration without foreign body of other part of head, initial encounter: Secondary | ICD-10-CM | POA: Diagnosis not present

## 2015-01-29 DIAGNOSIS — L03116 Cellulitis of left lower limb: Secondary | ICD-10-CM | POA: Insufficient documentation

## 2015-01-29 DIAGNOSIS — R079 Chest pain, unspecified: Secondary | ICD-10-CM | POA: Diagnosis not present

## 2015-01-29 DIAGNOSIS — M549 Dorsalgia, unspecified: Secondary | ICD-10-CM | POA: Insufficient documentation

## 2015-01-29 DIAGNOSIS — Z79899 Other long term (current) drug therapy: Secondary | ICD-10-CM | POA: Diagnosis not present

## 2015-01-29 DIAGNOSIS — L03115 Cellulitis of right lower limb: Secondary | ICD-10-CM | POA: Insufficient documentation

## 2015-01-29 DIAGNOSIS — E875 Hyperkalemia: Secondary | ICD-10-CM | POA: Insufficient documentation

## 2015-01-29 DIAGNOSIS — Z792 Long term (current) use of antibiotics: Secondary | ICD-10-CM | POA: Insufficient documentation

## 2015-01-29 DIAGNOSIS — Y9389 Activity, other specified: Secondary | ICD-10-CM | POA: Insufficient documentation

## 2015-01-29 DIAGNOSIS — S0993XA Unspecified injury of face, initial encounter: Secondary | ICD-10-CM | POA: Diagnosis present

## 2015-01-29 DIAGNOSIS — S0590XA Unspecified injury of unspecified eye and orbit, initial encounter: Secondary | ICD-10-CM | POA: Diagnosis not present

## 2015-01-29 HISTORY — DX: Hyperkalemia: E87.5

## 2015-01-29 HISTORY — DX: Obesity, unspecified: E66.9

## 2015-01-29 LAB — CBC WITH DIFFERENTIAL/PLATELET
Basophils Absolute: 0 10*3/uL (ref 0–0.1)
Basophils Relative: 0 %
EOS ABS: 0.2 10*3/uL (ref 0–0.7)
Eosinophils Relative: 3 %
HCT: 32.7 % — ABNORMAL LOW (ref 35.0–47.0)
HEMOGLOBIN: 11 g/dL — AB (ref 12.0–16.0)
LYMPHS ABS: 1.1 10*3/uL (ref 1.0–3.6)
LYMPHS PCT: 15 %
MCH: 32.8 pg (ref 26.0–34.0)
MCHC: 33.8 g/dL (ref 32.0–36.0)
MCV: 97.2 fL (ref 80.0–100.0)
MONOS PCT: 14 %
Monocytes Absolute: 1 10*3/uL — ABNORMAL HIGH (ref 0.2–0.9)
Neutro Abs: 4.8 10*3/uL (ref 1.4–6.5)
Neutrophils Relative %: 68 %
PLATELETS: 219 10*3/uL (ref 150–440)
RBC: 3.36 MIL/uL — AB (ref 3.80–5.20)
RDW: 14.5 % (ref 11.5–14.5)
WBC: 7.1 10*3/uL (ref 3.6–11.0)

## 2015-01-29 LAB — COMPREHENSIVE METABOLIC PANEL
ALK PHOS: 56 U/L (ref 38–126)
ALT: 15 U/L (ref 14–54)
AST: 25 U/L (ref 15–41)
Albumin: 3.9 g/dL (ref 3.5–5.0)
Anion gap: 8 (ref 5–15)
BUN: 29 mg/dL — AB (ref 6–20)
CO2: 26 mmol/L (ref 22–32)
CREATININE: 1.26 mg/dL — AB (ref 0.44–1.00)
Calcium: 9.2 mg/dL (ref 8.9–10.3)
Chloride: 97 mmol/L — ABNORMAL LOW (ref 101–111)
GFR calc Af Amer: 42 mL/min — ABNORMAL LOW (ref >60)
GFR, EST NON AFRICAN AMERICAN: 36 mL/min — AB (ref >60)
GLUCOSE: 108 mg/dL — AB (ref 65–99)
Potassium: 4.5 mmol/L (ref 3.5–5.1)
Sodium: 131 mmol/L — ABNORMAL LOW (ref 135–145)
TOTAL PROTEIN: 7 g/dL (ref 6.5–8.1)
Total Bilirubin: 0.1 mg/dL — ABNORMAL LOW (ref 0.3–1.2)

## 2015-01-29 LAB — CULTURE, BLOOD (ROUTINE X 2)
CULTURE: NO GROWTH
CULTURE: NO GROWTH
SPECIAL REQUESTS: NORMAL
Special Requests: NORMAL

## 2015-01-29 MED ORDER — FENTANYL CITRATE (PF) 100 MCG/2ML IJ SOLN
INTRAMUSCULAR | Status: AC
  Administered 2015-01-29: 50 ug via INTRAVENOUS
  Filled 2015-01-29: qty 2

## 2015-01-29 MED ORDER — LIDOCAINE-EPINEPHRINE (PF) 1 %-1:200000 IJ SOLN
INTRAMUSCULAR | Status: AC
  Filled 2015-01-29: qty 30

## 2015-01-29 MED ORDER — FENTANYL CITRATE (PF) 100 MCG/2ML IJ SOLN
50.0000 ug | Freq: Once | INTRAMUSCULAR | Status: AC
  Administered 2015-01-29: 50 ug via INTRAVENOUS

## 2015-01-29 NOTE — ED Provider Notes (Signed)
[facility] Emergency Department Provider Note  ____________________________________________  Time seen: Approximately 9:21 PM  I have reviewed the triage vital signs and the nursing notes.   HISTORY  Chief Complaint Fall HPI De Kayla Galloway is a 81 y.o. female who presents after a mechanical fall in which her feet got tangled up in her walker this evening and she sustained a laceration over her left eyebrow. She denies any headache and is not on blood thinners. She notes she has also had central chest pain all day after she sneezed yesterday and she suspects she pulled a muscle. Pain is worse with movement of her torso. She denies any shortness of breath. She took hydrocodone at 5 AM and 11 AM today but has been on hydrocodone for a long time due to multiple back fractures requiring kyphoplasty so she and her family do not think that this contributed to the fall. She was discharged on June 10 after being treated for cellulitis of her legs and has a home health care nurse coming tomorrow morning to recheck her legs. While they have not improved family does not think they have gotten worse. She denies any fevers or nausea but has had some general malaise for the past few days.  Past Medical History  Diagnosis Date  . Arthritis   . Osteoporosis   . Hypercholesterolemia   . Anemia   . GERD (gastroesophageal reflux disease)   . DVT (deep venous thrombosis), left     bilateral, IVC filter 2010  . Depression   . Gout   . Nephrolithiasis   . 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 Renal vein thrombosis     previous renal insufficiency  . Hypertension     Dr. [name]  . Cancer     skin ca lesion of scalp- removed  . Peripheral vascular disease   . Spider veins   . Obesity   . Hyperkalemia     Patient Active Problem List   Diagnosis Date Noted  . Hyperkalemia   . DVT (deep venous thrombosis) 01/26/2015  . ARF  (acute renal failure) 01/26/2015  . Cellulitis and abscess of foot 01/24/2015  . Cellulitis 01/12/2015  . Laceration of left leg 07/24/2014  . Laceration of left upper extremity 07/24/2014  . Weight loss 04/11/2014  . Urinary tract infectious disease 04/06/2014  . Rash, drug 07/18/2013  . Stasis ulcer 07/07/2013  . Anemia 06/28/2012  . Hypertension 06/28/2012  . Hypercholesterolemia 06/28/2012  . Renal insufficiency 06/28/2012  . Osteoporosis 06/28/2012    Past Surgical History  Procedure Laterality Date  . Fracture surgery  2009    hip  . Right oophorectomy      partial-  . Eye surgery      bil eyes  . Colonsopy    . Tonsillectomy      age 72  . Skin cancer excision      top of head  . Ivc filter      pt states it has turned side ways and could not be removed.  . Vertebroplasty  12/31/2011    Procedure: VERTEBROPLASTY;  Surgeon: Winfield Cunas, MD;  Location: [facility];  Service: Neurosurgery;  Laterality: N/A;  Thoracic eleven vertebroplasty  . Ectopic pregnancy surgery    . Hemorrhoid surgery    . Cardiac catheterization      2010 Does not see a cardiac  doctor  . Dental surgery    .  Kyphoplasty  09/03/2012    Procedure: KYPHOPLASTY;  Surgeon: Winfield Cunas, MD;  Location: [facility];  Service: Neurosurgery;  Laterality: N/A;  Thoracic eight Kyphoplasty    Current Outpatient Rx  Name  Route  Sig  Dispense  Refill  . acetaminophen (TYLENOL) 650 MG CR tablet   Oral   Take 650 mg by mouth every 8 (eight) hours as needed for pain.          Marland Kitchen apixaban (ELIQUIS) 5 MG TABS tablet   Oral   Take 1 tablet (5 mg total) by mouth 2 (two) times daily.   60 tablet   0   . bifidobacterium infantis (ALIGN) capsule   Oral   Take 1 capsule by mouth daily.   30 capsule   0     Take probiotic while on antibiotic   . Calcium Carb-Cholecalciferol (CALCIUM 600 + D PO)   Oral   Take 1 tablet by mouth 3 (three) times daily.         . clindamycin (CLEOCIN) 300 MG  capsule   Oral   Take 1 capsule (300 mg total) by mouth 3 (three) times daily.   27 capsule   0   . furosemide (LASIX) 20 MG tablet      Take 1 tablet (20 mg total) by mouth daily. Patient taking differently: Take 20 mg by mouth daily.    30 tablet   5   . hydrocerin (EUCERIN) CREA   Topical   Apply 1 application topically 2 (two) times daily.   113 g   1   . HYDROcodone-acetaminophen (NORCO/VICODIN) 5-325 MG per tablet   Oral   Take 1 tablet by mouth daily.          . hydrOXYzine (ATARAX/VISTARIL) 10 MG tablet   Oral   Take 1 tablet (10 mg total) by mouth 3 (three) times daily as needed for itching.   30 tablet   0   . Misc Natural Products (OSTEO BI-FLEX JOINT SHIELD PO)   Oral   Take 1 tablet by mouth 2 (two) times daily.         Marland Kitchen omeprazole (PRILOSEC) 20 MG capsule      Take 1 capsule by mouth  daily Patient taking differently: Take 20 mg by mouth daily.    90 capsule   0   . ramipril (ALTACE) 5 MG capsule      Take 1 capsule by mouth  daily Patient taking differently: Take 5 mg by mouth daily.    90 capsule   0   . sertraline (ZOLOFT) 50 MG tablet      Take 1 tablet by mouth  daily Patient taking differently: Take 50 mg by mouth daily.    90 tablet   0   . sulfamethoxazole-trimethoprim (BACTRIM DS,SEPTRA DS) 800-160 MG per tablet   Oral   Take 1 tablet by mouth 2 (two) times daily.   20 tablet   0     Allergies Cefuroxime axetil; Doxycycline; Advil; Celebrex; Daypro; Etodolac; Macrobid; Penicillins; Percocet; Tramadol; Valium; Neosporin; and Vicodin  Family History  Problem Relation Age of Onset  . Anesthesia problems Neg Hx   . Heart disease Father     myocardial infarction  . Heart disease Brother     myocardial infarction  . Lymphoma Sister   . Breast cancer Neg Hx   . Colon cancer Neg Hx     Social History History  Substance Use Topics  . Smoking  status: Never Smoker   . Smokeless tobacco: Never Used  . Alcohol Use: No     Review of Systems Constitutional: No fever/chills Eyes: No visual changes. ENT: No URI Cardiovascular: See History of present illness Respiratory: Denies shortness of breath. Gastrointestinal: No abdominal pain.  No nausea, no vomiting.  No diarrhea.   Musculoskeletal: Chronic back pain. Skin: See History of present illness Neurological: Negative for headaches, focal weakness or numbness. Psychiatric:Normal mood Endocrine:no weight change 10-point ROS otherwise negative.  ____________________________________________   PHYSICAL EXAM:  VITAL SIGNS: ED Triage Vitals  Enc Vitals Group     BP 01/29/15 1923 160/68 mmHg     Pulse Rate 01/29/15 1923 81     Resp 01/29/15 1923 18     Temp 01/29/15 1923 98.9 F (37.2 C)     Temp Source 01/29/15 1923 Oral     SpO2 01/29/15 1923 100 %     Weight 01/29/15 1923 115 lb (52.164 kg)     Height 01/29/15 1923 5\' 1"  (1.549 m)     Head Cir --      Peak Flow --      Pain Score 01/29/15 1924 6     Pain Loc --      Pain Edu? --      Excl. in [place]? --     Constitutional: Alert and oriented. Well appearing and in no acute distress. Eyes: Conjunctivae are normal. PERRL. EOMI. Head: Atraumatic. Nose: No congestion/rhinnorhea. Mouth/Throat: Mucous membranes are moist.  Oropharynx non-erythematous. Neck: No stridor.  NEXUS criteria negative Lymphatic: No cervical lymphadenopathy. Cardiovascular: Normal rate, regular rhythm. Marked Chest wall tenderness to palpation over lower sternum. The pain is worse when patient sits up or twists. Grossly normal heart sounds.  Peripheral pulses 2+ B Respiratory: Normal respiratory effort.  No retractions. Lungs CTAB. Gastrointestinal: Soft and nontender. No distention. No CVA tenderness. Musculoskeletal: No lower extremity tenderness nor edema.  No calf TTP. Neurologic:  Normal speech and language. No gross focal neurologic deficits are appreciated. Speech is normal. Normal finger-nose & heel-shin  B Skin:  Skin is warm, dry and intact. Left lower extremity erythema with overlying warmth ( family states it is not worse than when patient was discharged) Psychiatric: Mood and affect are normal. Speech and behavior are normal.  ____________________________________________   LABS (all labs ordered are listed, but only abnormal results are displayed)  Labs Reviewed  COMPREHENSIVE METABOLIC PANEL - Abnormal; Notable for the following:    Sodium 131 (*)    Chloride 97 (*)    Glucose, Bld 108 (*)    BUN 29 (*)    Creatinine, Ser 1.26 (*)    Total Bilirubin 0.1 (*)    GFR calc non Af Amer 36 (*)    GFR calc Af Amer 42 (*)    All other components within normal limits  CBC WITH DIFFERENTIAL/PLATELET - Abnormal; Notable for the following:    RBC 3.36 (*)    Hemoglobin 11.0 (*)    HCT 32.7 (*)    Monocytes Absolute 1.0 (*)    All other components within normal limits   ____________________________________________  EKG   Date: 01/29/2015 at 1923  Rate: 80  Rhythm: normal sinus rhythm  QRS Axis: Left axis deviation  Intervals: normal  ST/T Wave abnormalities: T flattening in aVL, V2  Conduction Disutrbances: none  Narrative Interpretation: unremarkable     ____________________________________________  RADIOLOGY  Chest x-ray-NAD, multiple old compression fractures of the spine ____________________________________________   PROCEDURES  Procedure(s) performed:  LACERATION REPAIR Performed by: Ponciano Ort Authorized by: Ponciano Ort Consent: Verbal consent obtained. Risks and benefits: risks, benefits and alternatives were discussed Consent given by: patient Patient identity confirmed: provided demographic data Prepped and Draped in normal sterile fashion Wound explored  Laceration Location:L forehead  Laceration Length: 2 cm  No Foreign Bodies seen or palpated  Anesthesia: local infiltration  Local anesthetic: lidocaine 1% w  epinephrine  Anesthetic total: 1 ml  Irrigation method: syringe Amount of cleaning: standard  Skin closure: 4 x 6-0 prolene; 1 x 6-0 prolene in 2nd wound, skin glue  Number of sutures: see above  Technique: sterile  Patient tolerance: Patient tolerated the procedure well with no immediate complications.  Critical Care performed: no  ____________________________________________   INITIAL IMPRESSION / ASSESSMENT AND PLAN / ED COURSE  Pertinent labs & imaging results that were available during my care of the patient were reviewed by me and considered in my medical decision making (see chart for details).  Patient states chest pain began after a sneeze and it hurts worse with motion of her torso and palpation therefore it seems consistent with chest wall pain. Patient chronically takes Norco. I am not comfortable prescribing stronger narcotics. Strongly advised patient, her sister, and her niece to follow up closely with patient's primary care provider Dr. [name]. Lower extremity cellulitis is being treated with oral antibiotics and is being followed with home health nursing. Patient has an appointment for repeat check tomorrow. Her white blood cell count is stable.  Patient also has a left lower extremity DVT and is prescribed Eliquis but family has been unable to afford and is awaiting further instructions from Dr. Nicki Reaper. She does have an IVC filter in place which makes this unlikely to cause a PE. However patient and family given strict return precautions with signs and symptoms of a PE. Given patient's marginal renal function and a CT PE protocol could cause ARF though would defer unless patient has symptoms that are more clinically suspicious for this diagnosis.  She is unsure what her reaction to ibuprofen is an patient family prefers to wait further pain management until she gets home as her pain has improved  significantly. ____________________________________________   FINAL CLINICAL IMPRESSION(S) / ED DIAGNOSES  Chest wall pain, facial laceration, chronic back pain from compression fractures, B LE cellulitis being followed by home health.    Ponciano Ort, MD 01/29/15 2223

## 2015-01-29 NOTE — Telephone Encounter (Signed)
Kayla Galloway came by the office to state that pt was given rx for Eliquis 5 mg tab. Mrs. Kayla Galloway stated that it is $400 for 60 tabs. Pt is unable to pay for rx and wanted to know is she can take something else. Mrs. Kayla Galloway brought a sheet from the pharmacy that is in Dr. Bary Leriche boxmsn

## 2015-01-29 NOTE — Discharge Instructions (Signed)
Return to the emergency department for new or worsening pain, difficulty breathing, fever, rapid heart rate, confusion, vomiting or for any other concerns. Your pain appears to be due to chest wall inflammation. However if new or worsening symptoms appear you need to return for evaluation of a pulmonary embolism. We also did not do a CT scan of your head as you are not currently on blood thinners and do not have headache, confusion, vomiting or any other concerning findings; however if this changes you need to return to the ER for further evaluation. It is very important that he follow-up with Dr. Nicki Reaper in the next day or so for repeat assessment. Your stitches need to be removed in 5 days.   Chest Wall Pain Chest wall pain is pain in or around the bones and muscles of your chest. It may take up to 6 weeks to get better. It may take longer if you must stay physically active in your work and activities.  CAUSES  Chest wall pain may happen on its own. However, it may be caused by:  A viral illness like the flu.  Injury.  Coughing.  Exercise.  Arthritis.  Fibromyalgia.  Shingles. HOME CARE INSTRUCTIONS   Avoid overtiring physical activity. Try not to strain or perform activities that cause pain. This includes any activities using your chest or your abdominal and side muscles, especially if heavy weights are used.  Put ice on the sore area.  Put ice in a plastic bag.  Place a towel between your skin and the bag.  Leave the ice on for 15-20 minutes per hour while awake for the first 2 days.  Only take over-the-counter or prescription medicines for pain, discomfort, or fever as directed by your caregiver. SEEK IMMEDIATE MEDICAL CARE IF:   Your pain increases, or you are very uncomfortable.  You have a fever.  Your chest pain becomes worse.  You have new, unexplained symptoms.  You have nausea or vomiting.  You feel sweaty or lightheaded.  You have a cough with phlegm  (sputum), or you cough up blood. MAKE SURE YOU:   Understand these instructions.  Will watch your condition.  Will get help right away if you are not doing well or get worse. Document Released: 08/04/2005 Document Revised: 10/27/2011 Document Reviewed: 03/31/2011 [facility] Patient Information 2015 [place], [place]. This information is not intended to replace advice given to you by your health care provider. Make sure you discuss any questions you have with your health care provider.  Facial Laceration A facial laceration is a cut on the face. These injuries can be painful and cause bleeding. Some cuts may need to be closed with stitches (sutures), skin adhesive strips, or wound glue. Cuts usually heal quickly but can leave a scar. It can take 1-2 years for the scar to go away completely. HOME CARE   Only take medicines as told by your doctor.  Follow your doctor's instructions for wound care. For Stitches:  Keep the cut clean and dry.  If you have a bandage (dressing), change it at least once a day. Change the bandage if it gets wet or dirty, or as told by your doctor.  Wash the cut with soap and water 2 times a day. Rinse the cut with water. Pat it dry with a clean towel.  Put a thin layer of medicated cream on the cut as told by your doctor.  You may shower after the first 24 hours. Do not soak the cut in  water until the stitches are removed.  Have your stitches removed as told by your doctor.  Do not wear any makeup until a few days after your stitches are removed. For Skin Adhesive Strips:  Keep the cut clean and dry.  Do not get the strips wet. You may take a bath, but be careful to keep the cut dry.  If the cut gets wet, pat it dry with a clean towel.  The strips will fall off on their own. Do not remove the strips that are still stuck to the cut. For Wound Glue:  You may shower or take baths. Do not soak or scrub the cut. Do not swim. Avoid heavy sweating until the  glue falls off on its own. After a shower or bath, pat the cut dry with a clean towel.  Do not put medicine or makeup on your cut until the glue falls off.  If you have a bandage, do not put tape over the glue.  Avoid lots of sunlight or tanning lamps until the glue falls off.  The glue will fall off on its own in 5-10 days. Do not pick at the glue. After Healing: Put sunscreen on the cut for the first year to reduce your scar. GET HELP RIGHT AWAY IF:   Your cut area gets red, painful, or puffy (swollen).  You see a yellowish-white fluid (pus) coming from the cut.  You have chills or a fever. MAKE SURE YOU:   Understand these instructions.  Will watch your condition.  Will get help right away if you are not doing well or get worse. Document Released: 01/21/2008 Document Revised: 05/25/2013 Document Reviewed: 03/17/2013 [facility] Patient Information 2015 [place], [place]. This information is not intended to replace advice given to you by your health care provider. Make sure you discuss any questions you have with your health care provider.

## 2015-01-29 NOTE — ED Notes (Signed)
Patient transported to X-ray 

## 2015-01-29 NOTE — Telephone Encounter (Signed)
Need form and need to go ahead and initiate PA urgent.  Needs to be on medication.  If unable to afford, will need to discuss coumadin.  See if can buy a few pills until we get PA

## 2015-01-29 NOTE — Telephone Encounter (Signed)
Left message for pt to return my call.

## 2015-01-29 NOTE — Telephone Encounter (Addendum)
Discharge Date: 01/26/15. Call completed with pt's sister, [name].   Transition Care Management Follow-up Telephone Call  How have you been since you were released from the hospital? Pt was seen in ED again last night. Tripped over carpet while walking back from bathroom with walker, hit her head on the door frame, has an abrasion on her forehead and received stitches above her left eye. Legs are still weeping, HH coming out daily for wound care. Pt's sister states her legs are slightly improved. Pt has been unable to afford Eliquis, [name] will check with pharmacy about getting 2-3 pills to hold patient over until appt on Thursday, but she says if she hasn't taken it yet, why should she start now for just 2 days?    Do you understand why you were in the hospital? YES, cellulitis left leg, LLE DVT   Do you understand the discharge instructions? YES, started on Clindamycin and not Eliquis due to cost.   Items Reviewed:  Medications reviewed: YES, Eliquis bid, Clindamycin tid, hydroxyzine prn  Allergies reviewed: YES, no new drug allergies  Dietary changes reviewed: YES, low sodium heart healthy diet  Referrals reviewed: [facility], resume wound care   Functional Questionnaire:   Activities of Daily Living (ADLs):  She states they are independent in the following: All States they require assistance with the following: None   Any transportation issues/concerns?: Pt's sister drives her to appt   Any patient concerns? Pt's sister would like her seen sooner than Thursday due to recent fall and ED visit. Message sent to Dr. Nicki Reaper as no appointments exist at this time.    Confirmed importance and date/time of follow-up visits scheduled: YES, appt with Dr. Nicki Reaper 02/01/15 @ 12:00.  Follow up appt with Dr. Ola Spurr 02/05/15   Confirmed with patient if condition begins to worsen call PCP or go to the ER. Patient was given the Call-a-Nurse line [phone]:  YES

## 2015-01-29 NOTE — ED Notes (Signed)
Brought to ed via EMS from home for evaluation of head laceration s/p mechanical fall at home. Received A&O*3 speaking full sentences, denies loc. Complaining of 6/10 chest pain s/c to "coughing".

## 2015-01-30 ENCOUNTER — Telehealth: Payer: Self-pay | Admitting: *Deleted

## 2015-01-30 DIAGNOSIS — Z4800 Encounter for change or removal of nonsurgical wound dressing: Secondary | ICD-10-CM | POA: Diagnosis not present

## 2015-01-30 DIAGNOSIS — M109 Gout, unspecified: Secondary | ICD-10-CM | POA: Diagnosis not present

## 2015-01-30 DIAGNOSIS — L03116 Cellulitis of left lower limb: Secondary | ICD-10-CM | POA: Diagnosis not present

## 2015-01-30 DIAGNOSIS — D649 Anemia, unspecified: Secondary | ICD-10-CM | POA: Diagnosis not present

## 2015-01-30 DIAGNOSIS — I739 Peripheral vascular disease, unspecified: Secondary | ICD-10-CM | POA: Diagnosis not present

## 2015-01-30 DIAGNOSIS — F329 Major depressive disorder, single episode, unspecified: Secondary | ICD-10-CM | POA: Diagnosis not present

## 2015-01-30 DIAGNOSIS — K219 Gastro-esophageal reflux disease without esophagitis: Secondary | ICD-10-CM | POA: Diagnosis not present

## 2015-01-30 DIAGNOSIS — E785 Hyperlipidemia, unspecified: Secondary | ICD-10-CM | POA: Diagnosis not present

## 2015-01-30 DIAGNOSIS — L03115 Cellulitis of right lower limb: Secondary | ICD-10-CM | POA: Diagnosis not present

## 2015-01-30 DIAGNOSIS — I10 Essential (primary) hypertension: Secondary | ICD-10-CM | POA: Diagnosis not present

## 2015-01-30 NOTE — Telephone Encounter (Signed)
Spoke to pt's sister for TCM call, see other telephone encounter, states pt has been unable to afford Eliquis, [name] will check with pharmacy about getting 2-3 pills to hold patient over until appt on Thursday, but she says if she hasn't taken it yet, why should she start now for just 2 days? Message was already sent to Dr. Nicki Reaper, see other encounter.

## 2015-01-30 NOTE — Telephone Encounter (Signed)
Placed samples of Eliquis 5 mg @ front desk for pt pick up. PCP did not have any samples available.

## 2015-01-30 NOTE — Telephone Encounter (Signed)
Please notify pts sister [name] that cardiology has samples of eliquis.  She will need to go ahead and pick these up and get started.  Is a blood thinner.  At increased risk for bleeding.  Use walker, slow position changes, etc.  I have also complete a PA form.

## 2015-01-30 NOTE — Telephone Encounter (Signed)
Tier Exception started online, given to Dr. Nicki Reaper for completion

## 2015-01-30 NOTE — Telephone Encounter (Signed)
Form faxed to [facility].

## 2015-01-30 NOTE — Telephone Encounter (Signed)
Per Nitchia, was able to get a few tablets from [facility] & a coupon that could be used with Medicare.

## 2015-01-30 NOTE — Telephone Encounter (Addendum)
Fax from [place], [place] forEliquis approved through 08/01/15. [place] notified, she had just picked up samples from cardiology. Advised to check what new cost of Eliquis would be and to call office if it is still expensive,  verbalized understanding. Then received another fax from [place], Request for tier exception was denied, states Eliquis does not qualify for lower copay. Will be covered at Tier 3.  Had previously told [name] to call if cost is still an issue when she gets Rx filled,  verbalized understanding

## 2015-01-30 NOTE — Telephone Encounter (Addendum)
TCM call completed with pt's sister, [name]. See below.  Dr. Nicki Reaper: Sister is requesting pt be seen sooner than Thursday, as she was in the ED again last night for a fall. The ED doctor mentioned pt should be seen today, advised that Dr. Nicki Reaper has no appointments for today or tomorrow but that she can see her on Thursday. No acute problems at the moment, just wanted her to be seen due to frequent ED visits: From TCM call:  Pt was seen in ED again last night. Tripped over carpet while walking back from bathroom with walker, hit her head on the door frame, has an abrasion on her forehead and received stitches above her left eye. Legs are still weeping, HH coming out daily for wound care. Pt's sister states her legs are slightly improved. Pt has been unable to afford Eliquis, [name] will check with pharmacy about getting 2-3 pills to hold patient over until appt on Thursday, but she says if she hasn't taken it yet, why should she start now for just 2 days?  Pt's sister was advised to keep appt on 02/01/15 that I would call if able to be seen prior to then,  verbalized understanding

## 2015-01-30 NOTE — Telephone Encounter (Signed)
Spoke with pts sister, advised of MDs message.  Kayla Galloway verbalized understanding.

## 2015-01-30 NOTE — Telephone Encounter (Signed)
My schedule is full today.  I am not sure if there are any acute visits available if needs eval prior to Thursday.  Nitchia is working on getting Eliquis samples for her from another office.  She will call her with info.

## 2015-01-31 ENCOUNTER — Ambulatory Visit: Payer: Self-pay | Admitting: Nurse Practitioner

## 2015-02-01 ENCOUNTER — Ambulatory Visit (INDEPENDENT_AMBULATORY_CARE_PROVIDER_SITE_OTHER): Payer: Medicare Other | Admitting: Internal Medicine

## 2015-02-01 ENCOUNTER — Encounter: Payer: Self-pay | Admitting: Internal Medicine

## 2015-02-01 VITALS — BP 130/70 | HR 78 | Temp 97.6°F | Ht 62.0 in | Wt 117.5 lb

## 2015-02-01 DIAGNOSIS — S0181XD Laceration without foreign body of other part of head, subsequent encounter: Secondary | ICD-10-CM

## 2015-02-01 DIAGNOSIS — L819 Disorder of pigmentation, unspecified: Secondary | ICD-10-CM

## 2015-02-01 DIAGNOSIS — I10 Essential (primary) hypertension: Secondary | ICD-10-CM

## 2015-02-01 DIAGNOSIS — L03119 Cellulitis of unspecified part of limb: Secondary | ICD-10-CM

## 2015-02-01 DIAGNOSIS — L03115 Cellulitis of right lower limb: Secondary | ICD-10-CM | POA: Diagnosis not present

## 2015-02-01 DIAGNOSIS — M7989 Other specified soft tissue disorders: Secondary | ICD-10-CM

## 2015-02-01 DIAGNOSIS — L03116 Cellulitis of left lower limb: Secondary | ICD-10-CM | POA: Diagnosis not present

## 2015-02-01 DIAGNOSIS — I82409 Acute embolism and thrombosis of unspecified deep veins of unspecified lower extremity: Secondary | ICD-10-CM

## 2015-02-01 DIAGNOSIS — I739 Peripheral vascular disease, unspecified: Secondary | ICD-10-CM | POA: Diagnosis not present

## 2015-02-01 DIAGNOSIS — Z4800 Encounter for change or removal of nonsurgical wound dressing: Secondary | ICD-10-CM | POA: Diagnosis not present

## 2015-02-01 DIAGNOSIS — R079 Chest pain, unspecified: Secondary | ICD-10-CM

## 2015-02-01 NOTE — Progress Notes (Signed)
Pre visit review using our clinic review tool, if applicable. No additional management support is needed unless otherwise documented below in the visit note. 

## 2015-02-02 ENCOUNTER — Encounter: Payer: Self-pay | Admitting: Internal Medicine

## 2015-02-02 ENCOUNTER — Telehealth: Payer: Self-pay | Admitting: *Deleted

## 2015-02-02 DIAGNOSIS — I10 Essential (primary) hypertension: Secondary | ICD-10-CM | POA: Diagnosis not present

## 2015-02-02 DIAGNOSIS — M7989 Other specified soft tissue disorders: Secondary | ICD-10-CM | POA: Diagnosis not present

## 2015-02-02 DIAGNOSIS — Z4800 Encounter for change or removal of nonsurgical wound dressing: Secondary | ICD-10-CM | POA: Diagnosis not present

## 2015-02-02 DIAGNOSIS — F329 Major depressive disorder, single episode, unspecified: Secondary | ICD-10-CM | POA: Diagnosis not present

## 2015-02-02 DIAGNOSIS — K219 Gastro-esophageal reflux disease without esophagitis: Secondary | ICD-10-CM | POA: Diagnosis not present

## 2015-02-02 DIAGNOSIS — M109 Gout, unspecified: Secondary | ICD-10-CM | POA: Diagnosis not present

## 2015-02-02 DIAGNOSIS — I739 Peripheral vascular disease, unspecified: Secondary | ICD-10-CM | POA: Diagnosis not present

## 2015-02-02 DIAGNOSIS — L03115 Cellulitis of right lower limb: Secondary | ICD-10-CM | POA: Diagnosis not present

## 2015-02-02 DIAGNOSIS — E785 Hyperlipidemia, unspecified: Secondary | ICD-10-CM | POA: Diagnosis not present

## 2015-02-02 DIAGNOSIS — D649 Anemia, unspecified: Secondary | ICD-10-CM | POA: Diagnosis not present

## 2015-02-02 DIAGNOSIS — L03116 Cellulitis of left lower limb: Secondary | ICD-10-CM | POA: Diagnosis not present

## 2015-02-02 NOTE — Telephone Encounter (Signed)
Pt called states she needs an order to have her sutures removed.  Please advise

## 2015-02-02 NOTE — Telephone Encounter (Signed)
[facility]  

## 2015-02-02 NOTE — Telephone Encounter (Signed)
Pt stated that she needs an order

## 2015-02-02 NOTE — Telephone Encounter (Signed)
Ok to have sutures removed per ER recommendations.

## 2015-02-02 NOTE — Telephone Encounter (Signed)
I have typed up a letter.  I don't know if it printed.  Will someone sign it for me.  Thanks

## 2015-02-02 NOTE — Telephone Encounter (Signed)
Order printed & signed by [name]

## 2015-02-05 ENCOUNTER — Encounter: Payer: Self-pay | Admitting: Internal Medicine

## 2015-02-05 DIAGNOSIS — I8312 Varicose veins of left lower extremity with inflammation: Secondary | ICD-10-CM | POA: Diagnosis not present

## 2015-02-05 DIAGNOSIS — Z4800 Encounter for change or removal of nonsurgical wound dressing: Secondary | ICD-10-CM | POA: Diagnosis not present

## 2015-02-05 DIAGNOSIS — D649 Anemia, unspecified: Secondary | ICD-10-CM | POA: Diagnosis not present

## 2015-02-05 DIAGNOSIS — R079 Chest pain, unspecified: Secondary | ICD-10-CM | POA: Insufficient documentation

## 2015-02-05 DIAGNOSIS — L03119 Cellulitis of unspecified part of limb: Secondary | ICD-10-CM | POA: Diagnosis not present

## 2015-02-05 DIAGNOSIS — S0181XA Laceration without foreign body of other part of head, initial encounter: Secondary | ICD-10-CM | POA: Insufficient documentation

## 2015-02-05 DIAGNOSIS — K219 Gastro-esophageal reflux disease without esophagitis: Secondary | ICD-10-CM | POA: Diagnosis not present

## 2015-02-05 DIAGNOSIS — M109 Gout, unspecified: Secondary | ICD-10-CM | POA: Diagnosis not present

## 2015-02-05 DIAGNOSIS — I10 Essential (primary) hypertension: Secondary | ICD-10-CM | POA: Diagnosis not present

## 2015-02-05 DIAGNOSIS — E785 Hyperlipidemia, unspecified: Secondary | ICD-10-CM | POA: Diagnosis not present

## 2015-02-05 DIAGNOSIS — F329 Major depressive disorder, single episode, unspecified: Secondary | ICD-10-CM | POA: Diagnosis not present

## 2015-02-05 DIAGNOSIS — L03116 Cellulitis of left lower limb: Secondary | ICD-10-CM | POA: Diagnosis not present

## 2015-02-05 DIAGNOSIS — L02419 Cutaneous abscess of limb, unspecified: Secondary | ICD-10-CM | POA: Diagnosis not present

## 2015-02-05 DIAGNOSIS — I739 Peripheral vascular disease, unspecified: Secondary | ICD-10-CM | POA: Diagnosis not present

## 2015-02-05 DIAGNOSIS — I890 Lymphedema, not elsewhere classified: Secondary | ICD-10-CM | POA: Diagnosis not present

## 2015-02-05 DIAGNOSIS — L03115 Cellulitis of right lower limb: Secondary | ICD-10-CM | POA: Diagnosis not present

## 2015-02-05 NOTE — Assessment & Plan Note (Signed)
Has sutures in place.  Due to have removed this weekend.  No evidence of infection.

## 2015-02-05 NOTE — Assessment & Plan Note (Signed)
Occurred after sneezing.  Good breath sounds.  Recent cxr as outlined.  Follow.  Concentrate on taking good deep breaths.  Follow.  Reproducible on exam.

## 2015-02-05 NOTE — Assessment & Plan Note (Addendum)
On clindamycin now.  Decreased swelling of lower extremities.  Still with increased redness and color change.  Continue clindamycin.  Keep f/u with infectious disease.  Also concern that venous insufficiency is contributing to her problems.  Has palpable DP pulses.  Have Dr Lucky Cowboy evaluate.

## 2015-02-05 NOTE — Progress Notes (Signed)
Patient ID: Kayla Galloway, female   DOB: 11-15-1920, 81 y.o.   MRN: [id number]   Subjective:    Patient ID: Kayla Galloway, female    DOB: 1921-06-10, 81 y.o.   MRN: [id number]  HPI  Patient here for a hospital follow up.  She was recently admitted with cellulitis.  Legs worsened after discharge.  See last note for details.  Was readmitted.  Had DVT.  Started on eliquis.  Also discharged on clindamycin.  Scheduled to follow up with Dr Ola Spurr.  Her legs are not as swollen.  Still red.  Able to ambulate better.  Discussed the need to use her walker regularly.  She also reported that she sneezed and noted soreness in her chest after sneezing.  Does hurt some to breathe.  No increased sob.  Could not afford the eliquis when discharged.  She is on eliquis now.  Insurance approved.  She recently tripped over the door facing.  Head scraped - laceration.  Was evaluated in ER.  Sutures in place.  No residual dizziness or headache. Discussed fall risk, etc - especially on eliquis.     Past Medical History  Diagnosis Date  . Arthritis   . Osteoporosis   . Hypercholesterolemia   . Anemia   . GERD (gastroesophageal reflux disease)   . DVT (deep venous thrombosis), left     bilateral, IVC filter 2010  . Depression   . Gout   . Nephrolithiasis   . 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 Renal vein thrombosis     previous renal insufficiency  . Hypertension     Dr. [name]  . Cancer     skin ca lesion of scalp- removed  . Peripheral vascular disease   . Spider veins   . Obesity   . Hyperkalemia     Current Outpatient Prescriptions on File Prior to Visit  Medication Sig Dispense Refill  . acetaminophen (TYLENOL) 650 MG CR tablet Take 650 mg by mouth every 8 (eight) hours as needed for pain.     Marland Kitchen apixaban (ELIQUIS) 5 MG TABS tablet Take 1 tablet (5 mg total) by mouth 2 (two) times daily. 60 tablet 0  . bifidobacterium infantis (ALIGN)  capsule Take 1 capsule by mouth daily. 30 capsule 0  . Calcium Carb-Cholecalciferol (CALCIUM 600 + D PO) Take 1 tablet by mouth 3 (three) times daily.    . clindamycin (CLEOCIN) 300 MG capsule Take 1 capsule (300 mg total) by mouth 3 (three) times daily. 27 capsule 0  . furosemide (LASIX) 20 MG tablet Take 1 tablet (20 mg total) by mouth daily. (Patient taking differently: Take 20 mg by mouth daily. ) 30 tablet 5  . hydrocerin (EUCERIN) CREA Apply 1 application topically 2 (two) times daily. 113 g 1  . HYDROcodone-acetaminophen (NORCO/VICODIN) 5-325 MG per tablet Take 1 tablet by mouth daily.     . hydrOXYzine (ATARAX/VISTARIL) 10 MG tablet Take 1 tablet (10 mg total) by mouth 3 (three) times daily as needed for itching. 30 tablet 0  . Misc Natural Products (OSTEO BI-FLEX JOINT SHIELD PO) Take 1 tablet by mouth 2 (two) times daily.    Marland Kitchen omeprazole (PRILOSEC) 20 MG capsule Take 1 capsule by mouth  daily (Patient taking differently: Take 20 mg by mouth daily. ) 90 capsule 0  . ramipril (ALTACE) 5 MG capsule Take 1 capsule by mouth  daily (Patient taking differently: Take 5 mg by  mouth daily. ) 90 capsule 0  . sertraline (ZOLOFT) 50 MG tablet Take 1 tablet by mouth  daily (Patient taking differently: Take 50 mg by mouth daily. ) 90 tablet 0   No current facility-administered medications on file prior to visit.    Review of Systems  Constitutional: Negative for appetite change and unexpected weight change.  HENT: Negative for congestion and sinus pressure.   Respiratory: Negative for cough, chest tightness and shortness of breath.   Cardiovascular: Positive for chest pain (some pain after sneezing.  ) and leg swelling (has improved. ). Negative for palpitations.  Gastrointestinal: Negative for nausea, vomiting, abdominal pain and diarrhea.  Skin: Positive for color change and rash (increased erythema extending up to knee left leg and mid lower leg right leg. ).  Neurological: Negative for dizziness  and headaches.  Psychiatric/Behavioral: Negative for dysphoric mood and agitation.       Objective:    Physical Exam  Constitutional: No distress.  HENT:  Nose: Nose normal.  Mouth/Throat: Oropharynx is clear and moist.  Neck: Neck supple. No thyromegaly present.  Cardiovascular: Normal rate and regular rhythm.   Pulmonary/Chest: Breath sounds normal. No respiratory distress. She has no wheezes.  Abdominal: Soft. Bowel sounds are normal. There is no tenderness.  Musculoskeletal:  Increased swelling - lower extremities - improved.  Increased erythema lower extremities.    Lymphadenopathy:    She has no cervical adenopathy.  Skin: No rash noted. No erythema.  Psychiatric: She has a normal mood and affect. Her behavior is normal.    BP 130/70 mmHg  Pulse 78  Temp(Src) 97.6 F (36.4 C) (Oral)  Ht 5\' 2"  (1.575 m)  Wt 117 lb 8 oz (53.298 kg)  BMI 21.49 kg/m2  SpO2 99% Wt Readings from Last 3 Encounters:  02/01/15 117 lb 8 oz (53.298 kg)  01/29/15 115 lb (52.164 kg)  01/26/15 124 lb 1.6 oz (56.291 kg)     Lab Results  Component Value Date   WBC 7.1 01/29/2015   HGB 11.0* 01/29/2015   HCT 32.7* 01/29/2015   PLT 219 01/29/2015   GLUCOSE 108* 01/29/2015   CHOL 229* 07/12/2012   TRIG 87.0 07/12/2012   HDL 86.30 07/12/2012   LDLDIRECT 135.9 07/12/2012   ALT 15 01/29/2015   AST 25 01/29/2015   NA 131* 01/29/2015   K 4.5 01/29/2015   CL 97* 01/29/2015   CREATININE 1.26* 01/29/2015   BUN 29* 01/29/2015   CO2 26 01/29/2015   TSH 1.72 04/10/2014   INR 1.00 01/24/2015    Dg Chest 2 View  01/29/2015   CLINICAL DATA:  Acute onset of chest pain just below the sternum. Status post fall. Initial encounter.  EXAM: CHEST  2 VIEW  COMPARISON:  Chest radiograph performed 08/05/2012  FINDINGS: The lungs are well-aerated. Peribronchial thickening is noted. Mild peripheral scarring is noted near the lung apices. There is no evidence of pleural effusion or pneumothorax.  The heart is  normal in size; the mediastinal contour is within normal limits. No acute osseous abnormalities are seen. The patient is status post vertebroplasty at the mid and lower thoracic spine. Multiple chronic compression deformities are noted along the mid and lower thoracic spine. An IVC filter is noted in grossly unchanged location.  IMPRESSION: 1. Chronic peribronchial thickening noted. Mild peripheral scarring at the lung apices. Lungs otherwise grossly clear. No displaced rib fracture seen. 2. Multiple chronic compression deformities along the mid and lower thoracic spine, with underlying changes of vertebroplasty  at several levels.   Electronically Signed   By: Garald Balding M.D.   On: 01/29/2015 20:38       Assessment & Plan:   Problem List Items Addressed This Visit    Cellulitis    On clindamycin now.  Decreased swelling of lower extremities.  Still with increased redness and color change.  Continue clindamycin.  Keep f/u with infectious disease.  Also concern that venous insufficiency is contributing to her problems.  Has palpable DP pulses.  Have Dr Lucky Cowboy evaluate.        Chest pain    Occurred after sneezing.  Good breath sounds.  Recent cxr as outlined.  Follow.  Concentrate on taking good deep breaths.  Follow.  Reproducible on exam.       DVT (deep venous thrombosis) - Primary    On eliquis.  Discussed at length with her today regarding risk of bleeding.  Discussed her fall risk.  Recent fall.  Follow closely.  Has IVC filter.  Have her see Dr Lucky Cowboy.  Will continue eliquis for now.  Have Dr Lucky Cowboy evaluate legs and filter placement (pt was question if moved).  States has had no bleeding issues previously.       Relevant Orders   Ambulatory referral to Vascular Surgery   Hypertension    Blood pressure has been under reasonable control.  Follow.  Same medication regimen.       Laceration of face    Has sutures in place.  Due to have removed this weekend.  No evidence of infection.          Other Visit Diagnoses    Swelling of lower extremity        Relevant Orders    Ambulatory referral to Vascular Surgery    Discoloration of skin of lower leg        Relevant Orders    Ambulatory referral to Vascular Surgery        [name], MD

## 2015-02-05 NOTE — Assessment & Plan Note (Signed)
Blood pressure has been under reasonable control.  Follow.  Same medication regimen.   

## 2015-02-05 NOTE — Assessment & Plan Note (Addendum)
On eliquis.  Discussed at length with her today regarding risk of bleeding.  Discussed her fall risk.  Recent fall.  Follow closely.  Has IVC filter.  Have her see Dr Lucky Cowboy.  Will continue eliquis for now.  Have Dr Lucky Cowboy evaluate legs and filter placement (pt was question if moved).  States has had no bleeding issues previously.

## 2015-02-08 DIAGNOSIS — I10 Essential (primary) hypertension: Secondary | ICD-10-CM | POA: Diagnosis not present

## 2015-02-08 DIAGNOSIS — I739 Peripheral vascular disease, unspecified: Secondary | ICD-10-CM | POA: Diagnosis not present

## 2015-02-08 DIAGNOSIS — K219 Gastro-esophageal reflux disease without esophagitis: Secondary | ICD-10-CM | POA: Diagnosis not present

## 2015-02-08 DIAGNOSIS — L03116 Cellulitis of left lower limb: Secondary | ICD-10-CM | POA: Diagnosis not present

## 2015-02-08 DIAGNOSIS — M109 Gout, unspecified: Secondary | ICD-10-CM | POA: Diagnosis not present

## 2015-02-08 DIAGNOSIS — E785 Hyperlipidemia, unspecified: Secondary | ICD-10-CM | POA: Diagnosis not present

## 2015-02-08 DIAGNOSIS — Z4800 Encounter for change or removal of nonsurgical wound dressing: Secondary | ICD-10-CM | POA: Diagnosis not present

## 2015-02-08 DIAGNOSIS — D649 Anemia, unspecified: Secondary | ICD-10-CM | POA: Diagnosis not present

## 2015-02-08 DIAGNOSIS — L03115 Cellulitis of right lower limb: Secondary | ICD-10-CM | POA: Diagnosis not present

## 2015-02-08 DIAGNOSIS — F329 Major depressive disorder, single episode, unspecified: Secondary | ICD-10-CM | POA: Diagnosis not present

## 2015-02-09 DIAGNOSIS — I890 Lymphedema, not elsewhere classified: Secondary | ICD-10-CM | POA: Diagnosis not present

## 2015-02-09 DIAGNOSIS — I8312 Varicose veins of left lower extremity with inflammation: Secondary | ICD-10-CM | POA: Diagnosis not present

## 2015-02-09 DIAGNOSIS — L02419 Cutaneous abscess of limb, unspecified: Secondary | ICD-10-CM | POA: Diagnosis not present

## 2015-02-09 DIAGNOSIS — L03119 Cellulitis of unspecified part of limb: Secondary | ICD-10-CM | POA: Diagnosis not present

## 2015-02-10 DIAGNOSIS — Z4800 Encounter for change or removal of nonsurgical wound dressing: Secondary | ICD-10-CM | POA: Diagnosis not present

## 2015-02-10 DIAGNOSIS — F329 Major depressive disorder, single episode, unspecified: Secondary | ICD-10-CM | POA: Diagnosis not present

## 2015-02-10 DIAGNOSIS — I10 Essential (primary) hypertension: Secondary | ICD-10-CM | POA: Diagnosis not present

## 2015-02-10 DIAGNOSIS — E785 Hyperlipidemia, unspecified: Secondary | ICD-10-CM | POA: Diagnosis not present

## 2015-02-10 DIAGNOSIS — I739 Peripheral vascular disease, unspecified: Secondary | ICD-10-CM | POA: Diagnosis not present

## 2015-02-10 DIAGNOSIS — K219 Gastro-esophageal reflux disease without esophagitis: Secondary | ICD-10-CM | POA: Diagnosis not present

## 2015-02-10 DIAGNOSIS — L03115 Cellulitis of right lower limb: Secondary | ICD-10-CM | POA: Diagnosis not present

## 2015-02-10 DIAGNOSIS — L03116 Cellulitis of left lower limb: Secondary | ICD-10-CM | POA: Diagnosis not present

## 2015-02-10 DIAGNOSIS — D649 Anemia, unspecified: Secondary | ICD-10-CM | POA: Diagnosis not present

## 2015-02-10 DIAGNOSIS — M109 Gout, unspecified: Secondary | ICD-10-CM | POA: Diagnosis not present

## 2015-02-12 ENCOUNTER — Other Ambulatory Visit: Payer: Self-pay | Admitting: *Deleted

## 2015-02-12 NOTE — Telephone Encounter (Addendum)
Pt's sister, [name], called, requesting refill on Hydroxyzine. Pt has been using to help itching from cellulitis, states is works well in controlling symptoms. Was Rx'd from hospital, no refills available, ok to refill? [facility]. Please call [name] back on cell phone to notify when done.

## 2015-02-12 NOTE — Telephone Encounter (Signed)
Please clarify how often she is taking the hydroxyzine.  rx is written for tid.  I think this is too often for her to take.  Just need to clarify how taking and if any problems when takes - i.e, does it make her drowsy?

## 2015-02-13 DIAGNOSIS — K219 Gastro-esophageal reflux disease without esophagitis: Secondary | ICD-10-CM | POA: Diagnosis not present

## 2015-02-13 DIAGNOSIS — Z4800 Encounter for change or removal of nonsurgical wound dressing: Secondary | ICD-10-CM | POA: Diagnosis not present

## 2015-02-13 DIAGNOSIS — F329 Major depressive disorder, single episode, unspecified: Secondary | ICD-10-CM | POA: Diagnosis not present

## 2015-02-13 DIAGNOSIS — E785 Hyperlipidemia, unspecified: Secondary | ICD-10-CM | POA: Diagnosis not present

## 2015-02-13 DIAGNOSIS — D649 Anemia, unspecified: Secondary | ICD-10-CM | POA: Diagnosis not present

## 2015-02-13 DIAGNOSIS — L03115 Cellulitis of right lower limb: Secondary | ICD-10-CM | POA: Diagnosis not present

## 2015-02-13 DIAGNOSIS — I10 Essential (primary) hypertension: Secondary | ICD-10-CM | POA: Diagnosis not present

## 2015-02-13 DIAGNOSIS — M109 Gout, unspecified: Secondary | ICD-10-CM | POA: Diagnosis not present

## 2015-02-13 DIAGNOSIS — I739 Peripheral vascular disease, unspecified: Secondary | ICD-10-CM | POA: Diagnosis not present

## 2015-02-13 DIAGNOSIS — L03116 Cellulitis of left lower limb: Secondary | ICD-10-CM | POA: Diagnosis not present

## 2015-02-13 MED ORDER — HYDROXYZINE HCL 10 MG PO TABS: ORAL_TABLET | ORAL | Status: DC

## 2015-02-13 NOTE — Telephone Encounter (Signed)
Refilled hydroxyzine #30 with no refills.  Changed directions to q day to bid prn.  Would like to ger her off this medication.

## 2015-02-13 NOTE — Telephone Encounter (Signed)
[place] notified and  verbalized understanding

## 2015-02-13 NOTE — Telephone Encounter (Signed)
PT has been out of her medicationfor a few days. She was taking it tid. Denies any symptoms after taking hydroxyzine.

## 2015-02-14 DIAGNOSIS — L03116 Cellulitis of left lower limb: Secondary | ICD-10-CM | POA: Diagnosis not present

## 2015-02-14 DIAGNOSIS — K219 Gastro-esophageal reflux disease without esophagitis: Secondary | ICD-10-CM | POA: Diagnosis not present

## 2015-02-14 DIAGNOSIS — L03115 Cellulitis of right lower limb: Secondary | ICD-10-CM | POA: Diagnosis not present

## 2015-02-14 DIAGNOSIS — F329 Major depressive disorder, single episode, unspecified: Secondary | ICD-10-CM | POA: Diagnosis not present

## 2015-02-14 DIAGNOSIS — D649 Anemia, unspecified: Secondary | ICD-10-CM | POA: Diagnosis not present

## 2015-02-14 DIAGNOSIS — M109 Gout, unspecified: Secondary | ICD-10-CM | POA: Diagnosis not present

## 2015-02-14 DIAGNOSIS — E785 Hyperlipidemia, unspecified: Secondary | ICD-10-CM | POA: Diagnosis not present

## 2015-02-14 DIAGNOSIS — I10 Essential (primary) hypertension: Secondary | ICD-10-CM | POA: Diagnosis not present

## 2015-02-14 DIAGNOSIS — I739 Peripheral vascular disease, unspecified: Secondary | ICD-10-CM | POA: Diagnosis not present

## 2015-02-14 DIAGNOSIS — Z4800 Encounter for change or removal of nonsurgical wound dressing: Secondary | ICD-10-CM | POA: Diagnosis not present

## 2015-02-16 DIAGNOSIS — Z4800 Encounter for change or removal of nonsurgical wound dressing: Secondary | ICD-10-CM | POA: Diagnosis not present

## 2015-02-16 DIAGNOSIS — L03116 Cellulitis of left lower limb: Secondary | ICD-10-CM | POA: Diagnosis not present

## 2015-02-16 DIAGNOSIS — D649 Anemia, unspecified: Secondary | ICD-10-CM | POA: Diagnosis not present

## 2015-02-16 DIAGNOSIS — L03115 Cellulitis of right lower limb: Secondary | ICD-10-CM | POA: Diagnosis not present

## 2015-02-16 DIAGNOSIS — I739 Peripheral vascular disease, unspecified: Secondary | ICD-10-CM | POA: Diagnosis not present

## 2015-02-16 DIAGNOSIS — E785 Hyperlipidemia, unspecified: Secondary | ICD-10-CM | POA: Diagnosis not present

## 2015-02-16 DIAGNOSIS — M109 Gout, unspecified: Secondary | ICD-10-CM | POA: Diagnosis not present

## 2015-02-16 DIAGNOSIS — I10 Essential (primary) hypertension: Secondary | ICD-10-CM | POA: Diagnosis not present

## 2015-02-16 DIAGNOSIS — F329 Major depressive disorder, single episode, unspecified: Secondary | ICD-10-CM | POA: Diagnosis not present

## 2015-02-16 DIAGNOSIS — K219 Gastro-esophageal reflux disease without esophagitis: Secondary | ICD-10-CM | POA: Diagnosis not present

## 2015-02-20 DIAGNOSIS — D649 Anemia, unspecified: Secondary | ICD-10-CM | POA: Diagnosis not present

## 2015-02-20 DIAGNOSIS — L03116 Cellulitis of left lower limb: Secondary | ICD-10-CM | POA: Diagnosis not present

## 2015-02-20 DIAGNOSIS — I10 Essential (primary) hypertension: Secondary | ICD-10-CM | POA: Diagnosis not present

## 2015-02-20 DIAGNOSIS — I739 Peripheral vascular disease, unspecified: Secondary | ICD-10-CM | POA: Diagnosis not present

## 2015-02-20 DIAGNOSIS — F329 Major depressive disorder, single episode, unspecified: Secondary | ICD-10-CM | POA: Diagnosis not present

## 2015-02-20 DIAGNOSIS — M109 Gout, unspecified: Secondary | ICD-10-CM | POA: Diagnosis not present

## 2015-02-20 DIAGNOSIS — K219 Gastro-esophageal reflux disease without esophagitis: Secondary | ICD-10-CM | POA: Diagnosis not present

## 2015-02-20 DIAGNOSIS — Z4800 Encounter for change or removal of nonsurgical wound dressing: Secondary | ICD-10-CM | POA: Diagnosis not present

## 2015-02-20 DIAGNOSIS — L03115 Cellulitis of right lower limb: Secondary | ICD-10-CM | POA: Diagnosis not present

## 2015-02-20 DIAGNOSIS — E785 Hyperlipidemia, unspecified: Secondary | ICD-10-CM | POA: Diagnosis not present

## 2015-02-22 DIAGNOSIS — L03116 Cellulitis of left lower limb: Secondary | ICD-10-CM | POA: Diagnosis not present

## 2015-02-22 DIAGNOSIS — I10 Essential (primary) hypertension: Secondary | ICD-10-CM | POA: Diagnosis not present

## 2015-02-22 DIAGNOSIS — I739 Peripheral vascular disease, unspecified: Secondary | ICD-10-CM | POA: Diagnosis not present

## 2015-02-22 DIAGNOSIS — M109 Gout, unspecified: Secondary | ICD-10-CM | POA: Diagnosis not present

## 2015-02-22 DIAGNOSIS — D649 Anemia, unspecified: Secondary | ICD-10-CM | POA: Diagnosis not present

## 2015-02-22 DIAGNOSIS — F329 Major depressive disorder, single episode, unspecified: Secondary | ICD-10-CM | POA: Diagnosis not present

## 2015-02-22 DIAGNOSIS — L03115 Cellulitis of right lower limb: Secondary | ICD-10-CM | POA: Diagnosis not present

## 2015-02-22 DIAGNOSIS — E785 Hyperlipidemia, unspecified: Secondary | ICD-10-CM | POA: Diagnosis not present

## 2015-02-22 DIAGNOSIS — K219 Gastro-esophageal reflux disease without esophagitis: Secondary | ICD-10-CM | POA: Diagnosis not present

## 2015-02-22 DIAGNOSIS — Z4800 Encounter for change or removal of nonsurgical wound dressing: Secondary | ICD-10-CM | POA: Diagnosis not present

## 2015-02-23 ENCOUNTER — Telehealth: Payer: Self-pay | Admitting: Internal Medicine

## 2015-02-23 NOTE — Telephone Encounter (Signed)
Spoke to pt's sister [name], she had been notified by doctor where it was done. States she has not given pt the Eliquis since her 2 week supply completed and will not since ultrasound showed no DVT. States pt legs continue to be edematous and weeping. States HH comes out to change dressings but no improvement. Denies fever. No new sores or openings. States pt is fatigued and weak.

## 2015-02-23 NOTE — Telephone Encounter (Signed)
She should be seeing Dr Ola Spurr (infectious disease) for f/u of her legs.  Make sure she is seeing them and I would continue f/u with vascular.

## 2015-02-23 NOTE — Telephone Encounter (Signed)
Left message, notifying [name].

## 2015-02-23 NOTE — Telephone Encounter (Signed)
Notify pt that her lower extremity ultrasound reveals no clot.

## 2015-02-26 DIAGNOSIS — F329 Major depressive disorder, single episode, unspecified: Secondary | ICD-10-CM | POA: Diagnosis not present

## 2015-02-26 DIAGNOSIS — I739 Peripheral vascular disease, unspecified: Secondary | ICD-10-CM | POA: Diagnosis not present

## 2015-02-26 DIAGNOSIS — M109 Gout, unspecified: Secondary | ICD-10-CM | POA: Diagnosis not present

## 2015-02-26 DIAGNOSIS — Z4800 Encounter for change or removal of nonsurgical wound dressing: Secondary | ICD-10-CM | POA: Diagnosis not present

## 2015-02-26 DIAGNOSIS — L03115 Cellulitis of right lower limb: Secondary | ICD-10-CM | POA: Diagnosis not present

## 2015-02-26 DIAGNOSIS — D649 Anemia, unspecified: Secondary | ICD-10-CM | POA: Diagnosis not present

## 2015-02-26 DIAGNOSIS — K219 Gastro-esophageal reflux disease without esophagitis: Secondary | ICD-10-CM | POA: Diagnosis not present

## 2015-02-26 DIAGNOSIS — I10 Essential (primary) hypertension: Secondary | ICD-10-CM | POA: Diagnosis not present

## 2015-02-26 DIAGNOSIS — L03116 Cellulitis of left lower limb: Secondary | ICD-10-CM | POA: Diagnosis not present

## 2015-02-26 DIAGNOSIS — E785 Hyperlipidemia, unspecified: Secondary | ICD-10-CM | POA: Diagnosis not present

## 2015-03-05 DIAGNOSIS — E785 Hyperlipidemia, unspecified: Secondary | ICD-10-CM | POA: Diagnosis not present

## 2015-03-05 DIAGNOSIS — I739 Peripheral vascular disease, unspecified: Secondary | ICD-10-CM | POA: Diagnosis not present

## 2015-03-05 DIAGNOSIS — I10 Essential (primary) hypertension: Secondary | ICD-10-CM | POA: Diagnosis not present

## 2015-03-05 DIAGNOSIS — L03116 Cellulitis of left lower limb: Secondary | ICD-10-CM | POA: Diagnosis not present

## 2015-03-05 DIAGNOSIS — M109 Gout, unspecified: Secondary | ICD-10-CM | POA: Diagnosis not present

## 2015-03-05 DIAGNOSIS — L03115 Cellulitis of right lower limb: Secondary | ICD-10-CM | POA: Diagnosis not present

## 2015-03-05 DIAGNOSIS — D649 Anemia, unspecified: Secondary | ICD-10-CM | POA: Diagnosis not present

## 2015-03-05 DIAGNOSIS — Z4800 Encounter for change or removal of nonsurgical wound dressing: Secondary | ICD-10-CM | POA: Diagnosis not present

## 2015-03-05 DIAGNOSIS — K219 Gastro-esophageal reflux disease without esophagitis: Secondary | ICD-10-CM | POA: Diagnosis not present

## 2015-03-05 DIAGNOSIS — F329 Major depressive disorder, single episode, unspecified: Secondary | ICD-10-CM | POA: Diagnosis not present

## 2015-03-06 ENCOUNTER — Encounter: Payer: Self-pay | Admitting: Internal Medicine

## 2015-03-07 DIAGNOSIS — Z4800 Encounter for change or removal of nonsurgical wound dressing: Secondary | ICD-10-CM | POA: Diagnosis not present

## 2015-03-07 DIAGNOSIS — L03115 Cellulitis of right lower limb: Secondary | ICD-10-CM | POA: Diagnosis not present

## 2015-03-07 DIAGNOSIS — I739 Peripheral vascular disease, unspecified: Secondary | ICD-10-CM | POA: Diagnosis not present

## 2015-03-07 DIAGNOSIS — D649 Anemia, unspecified: Secondary | ICD-10-CM | POA: Diagnosis not present

## 2015-03-07 DIAGNOSIS — M109 Gout, unspecified: Secondary | ICD-10-CM | POA: Diagnosis not present

## 2015-03-07 DIAGNOSIS — E785 Hyperlipidemia, unspecified: Secondary | ICD-10-CM | POA: Diagnosis not present

## 2015-03-07 DIAGNOSIS — I10 Essential (primary) hypertension: Secondary | ICD-10-CM | POA: Diagnosis not present

## 2015-03-07 DIAGNOSIS — L03116 Cellulitis of left lower limb: Secondary | ICD-10-CM | POA: Diagnosis not present

## 2015-03-07 DIAGNOSIS — K219 Gastro-esophageal reflux disease without esophagitis: Secondary | ICD-10-CM | POA: Diagnosis not present

## 2015-03-07 DIAGNOSIS — F329 Major depressive disorder, single episode, unspecified: Secondary | ICD-10-CM | POA: Diagnosis not present

## 2015-03-08 DIAGNOSIS — L03115 Cellulitis of right lower limb: Secondary | ICD-10-CM | POA: Diagnosis not present

## 2015-03-09 DIAGNOSIS — L03119 Cellulitis of unspecified part of limb: Secondary | ICD-10-CM | POA: Diagnosis not present

## 2015-03-09 DIAGNOSIS — L02419 Cutaneous abscess of limb, unspecified: Secondary | ICD-10-CM | POA: Diagnosis not present

## 2015-03-09 DIAGNOSIS — I890 Lymphedema, not elsewhere classified: Secondary | ICD-10-CM | POA: Diagnosis not present

## 2015-03-09 DIAGNOSIS — I8312 Varicose veins of left lower extremity with inflammation: Secondary | ICD-10-CM | POA: Diagnosis not present

## 2015-03-13 DIAGNOSIS — D649 Anemia, unspecified: Secondary | ICD-10-CM | POA: Diagnosis not present

## 2015-03-13 DIAGNOSIS — F329 Major depressive disorder, single episode, unspecified: Secondary | ICD-10-CM | POA: Diagnosis not present

## 2015-03-13 DIAGNOSIS — M109 Gout, unspecified: Secondary | ICD-10-CM | POA: Diagnosis not present

## 2015-03-13 DIAGNOSIS — K219 Gastro-esophageal reflux disease without esophagitis: Secondary | ICD-10-CM | POA: Diagnosis not present

## 2015-03-13 DIAGNOSIS — E785 Hyperlipidemia, unspecified: Secondary | ICD-10-CM | POA: Diagnosis not present

## 2015-03-13 DIAGNOSIS — L03116 Cellulitis of left lower limb: Secondary | ICD-10-CM | POA: Diagnosis not present

## 2015-03-13 DIAGNOSIS — L03115 Cellulitis of right lower limb: Secondary | ICD-10-CM | POA: Diagnosis not present

## 2015-03-13 DIAGNOSIS — Z4800 Encounter for change or removal of nonsurgical wound dressing: Secondary | ICD-10-CM | POA: Diagnosis not present

## 2015-03-13 DIAGNOSIS — I10 Essential (primary) hypertension: Secondary | ICD-10-CM | POA: Diagnosis not present

## 2015-03-13 DIAGNOSIS — I739 Peripheral vascular disease, unspecified: Secondary | ICD-10-CM | POA: Diagnosis not present

## 2015-03-16 DIAGNOSIS — I8312 Varicose veins of left lower extremity with inflammation: Secondary | ICD-10-CM | POA: Diagnosis not present

## 2015-03-16 DIAGNOSIS — L02419 Cutaneous abscess of limb, unspecified: Secondary | ICD-10-CM | POA: Diagnosis not present

## 2015-03-16 DIAGNOSIS — L03119 Cellulitis of unspecified part of limb: Secondary | ICD-10-CM | POA: Diagnosis not present

## 2015-03-16 DIAGNOSIS — I890 Lymphedema, not elsewhere classified: Secondary | ICD-10-CM | POA: Diagnosis not present

## 2015-03-22 ENCOUNTER — Encounter: Payer: Self-pay | Admitting: Internal Medicine

## 2015-03-22 ENCOUNTER — Ambulatory Visit (INDEPENDENT_AMBULATORY_CARE_PROVIDER_SITE_OTHER): Payer: Medicare Other | Admitting: Internal Medicine

## 2015-03-22 VITALS — BP 120/60 | HR 67 | Temp 97.6°F | Ht 62.0 in | Wt 123.2 lb

## 2015-03-22 DIAGNOSIS — N289 Disorder of kidney and ureter, unspecified: Secondary | ICD-10-CM | POA: Diagnosis not present

## 2015-03-22 DIAGNOSIS — I83009 Varicose veins of unspecified lower extremity with ulcer of unspecified site: Secondary | ICD-10-CM | POA: Diagnosis not present

## 2015-03-22 DIAGNOSIS — I82409 Acute embolism and thrombosis of unspecified deep veins of unspecified lower extremity: Secondary | ICD-10-CM

## 2015-03-22 DIAGNOSIS — L03119 Cellulitis of unspecified part of limb: Secondary | ICD-10-CM

## 2015-03-22 DIAGNOSIS — I10 Essential (primary) hypertension: Secondary | ICD-10-CM | POA: Diagnosis not present

## 2015-03-22 DIAGNOSIS — L97909 Non-pressure chronic ulcer of unspecified part of unspecified lower leg with unspecified severity: Secondary | ICD-10-CM

## 2015-03-22 MED ORDER — CLINDAMYCIN HCL 300 MG PO CAPS: 300.0000 mg | ORAL_CAPSULE | Freq: Three times a day (TID) | ORAL | Status: DC

## 2015-03-22 MED ORDER — TRIAMCINOLONE ACETONIDE 0.5 % EX CREA: 1.0000 "application " | TOPICAL_CREAM | Freq: Two times a day (BID) | CUTANEOUS | Status: DC

## 2015-03-22 NOTE — Progress Notes (Signed)
Patient ID: Kayla Galloway, female   DOB: March 28, 1921, 81 y.o.   MRN: [id number]   Subjective:    Patient ID: Kayla Galloway, female    DOB: 15-Sep-1920, 81 y.o.   MRN: [id number]  HPI  Patient here for a scheduled follow up.  She is accompanied by her sister.  History obtained from both of them.  She has been followed by Dr Ola Spurr for lower extremity cellulitis and venostasis.  See his note for details.  Has been off abx for a while now.  Two weeks ago, was prescribed triamcinolone cream.  The redness improved and legs looked better (on f/u last week).  Still had issues with swelling.  Reports that since stopping the steroid cream, legs are worse.  Swelling continues to be an issue.  Legs are more swollen today.  She has not worn her compression hose today.  Sister states some good and bad days with the swelling.  It fluctuates.  No fever.  She is eating.  Does report diarrhea.  No cough or congestion.  No abdominal pain or cramping.     Past Medical History  Diagnosis Date  . Arthritis   . Osteoporosis   . Hypercholesterolemia   . Anemia   . GERD (gastroesophageal reflux disease)   . DVT (deep venous thrombosis), left     bilateral, IVC filter 2010  . Depression   . Gout   . Nephrolithiasis   . 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 Renal vein thrombosis     previous renal insufficiency  . Hypertension     Dr. [name]  . Cancer     skin ca lesion of scalp- removed  . Peripheral vascular disease   . Spider veins   . Obesity   . Hyperkalemia     Outpatient Encounter Prescriptions as of 03/22/2015  Medication Sig  . acetaminophen (TYLENOL) 650 MG CR tablet Take 650 mg by mouth every 8 (eight) hours as needed for pain.   Marland Kitchen apixaban (ELIQUIS) 5 MG TABS tablet Take 1 tablet (5 mg total) by mouth 2 (two) times daily.  . bifidobacterium infantis (ALIGN) capsule Take 1 capsule by mouth daily.  . Calcium Carb-Cholecalciferol (CALCIUM  600 + D PO) Take 1 tablet by mouth 3 (three) times daily.  . clindamycin (CLEOCIN) 300 MG capsule Take 1 capsule (300 mg total) by mouth 3 (three) times daily.  . furosemide (LASIX) 20 MG tablet Take 1 tablet (20 mg total) by mouth daily. (Patient taking differently: Take 20 mg by mouth daily. )  . hydrocerin (EUCERIN) CREA Apply 1 application topically 2 (two) times daily.  Marland Kitchen HYDROcodone-acetaminophen (NORCO/VICODIN) 5-325 MG per tablet Take 1 tablet by mouth daily.   . hydrOXYzine (ATARAX/VISTARIL) 10 MG tablet q day to bid prn  . Misc Natural Products (OSTEO BI-FLEX JOINT SHIELD PO) Take 1 tablet by mouth 2 (two) times daily.  Marland Kitchen omeprazole (PRILOSEC) 20 MG capsule Take 1 capsule by mouth  daily (Patient taking differently: Take 20 mg by mouth daily. )  . ramipril (ALTACE) 5 MG capsule Take 1 capsule by mouth  daily (Patient taking differently: Take 5 mg by mouth daily. )  . sertraline (ZOLOFT) 50 MG tablet Take 1 tablet by mouth  daily (Patient taking differently: Take 50 mg by mouth daily. )  . [DISCONTINUED] clindamycin (CLEOCIN) 300 MG capsule Take 1 capsule (300 mg total) by mouth 3 (three) times daily.  Marland Kitchen  triamcinolone cream (KENALOG) 0.5 % Apply 1 application topically 2 (two) times daily.   No facility-administered encounter medications on file as of 03/22/2015.    Review of Systems  Constitutional: Negative for fever and appetite change.  HENT: Negative for congestion and sinus pressure.   Respiratory: Negative for cough, chest tightness and shortness of breath.   Cardiovascular: Positive for leg swelling (overall stable.  states somedays are better than others.  a little more swelling today secondary to not wearing her hose.  ). Negative for chest pain and palpitations.  Gastrointestinal: Positive for diarrhea. Negative for nausea, vomiting and abdominal pain.  Genitourinary: Negative for dysuria and difficulty urinating.  Skin:       Skin is red - feet and extending up her lower  legs.    Neurological: Negative for dizziness, light-headedness and headaches.  Psychiatric/Behavioral: Negative for dysphoric mood and agitation.       Objective:    Physical Exam  Constitutional: She appears well-developed and well-nourished. No distress.  HENT:  Nose: Nose normal.  Mouth/Throat: Oropharynx is clear and moist.  Neck: Neck supple. No thyromegaly present.  Cardiovascular: Normal rate and regular rhythm.   Pulmonary/Chest: Breath sounds normal. No respiratory distress. She has no wheezes.  Abdominal: Soft. Bowel sounds are normal. There is no tenderness.  Musculoskeletal:  Increased pedal and lower extremity edema - bilateral.  Some increased erythema - pedal and lower extremity.  Erythema appears to be more c/w stasis dermatitis.  Unclear if possible overlying infection.  Some increased burning and discomfort.    Lymphadenopathy:    She has no cervical adenopathy.  Skin:  Erythema as outlined.   Psychiatric: She has a normal mood and affect. Her behavior is normal.    BP 120/60 mmHg  Pulse 67  Temp(Src) 97.6 F (36.4 C) (Oral)  Ht 5\' 2"  (1.575 m)  Wt 123 lb 4 oz (55.906 kg)  BMI 22.54 kg/m2  SpO2 99% Wt Readings from Last 3 Encounters:  03/22/15 123 lb 4 oz (55.906 kg)  02/01/15 117 lb 8 oz (53.298 kg)  01/29/15 115 lb (52.164 kg)     Lab Results  Component Value Date   WBC 7.1 01/29/2015   HGB 11.0* 01/29/2015   HCT 32.7* 01/29/2015   PLT 219 01/29/2015   GLUCOSE 108* 01/29/2015   CHOL 229* 07/12/2012   TRIG 87.0 07/12/2012   HDL 86.30 07/12/2012   LDLDIRECT 135.9 07/12/2012   ALT 15 01/29/2015   AST 25 01/29/2015   NA 131* 01/29/2015   K 4.5 01/29/2015   CL 97* 01/29/2015   CREATININE 1.26* 01/29/2015   BUN 29* 01/29/2015   CO2 26 01/29/2015   TSH 1.72 04/10/2014   INR 1.00 01/24/2015       Assessment & Plan:   Problem List Items Addressed This Visit    Cellulitis    Is being followed by Dr Ola Spurr.  Just evaluated last week.   Leg worsened today.  More redness.  Appears to have venostasis changes.  Will restart triamcinolone cream.  Unclear if some infection present.  Concern with increased redness and pain.  Will start with the cream.  Call tomorrow.  May need to reevaluate tomorrow.  Was given rx for clindamycin to hold and will see if needs.  Elevate legs.  Needs to wear compression hose.       DVT (deep venous thrombosis) - Primary    Saw vascular surgery.  Ultrasound negative for DVT per pts sister.  Off eliquis.  Follow.       Hypertension    Blood pressure has been doing well.  Same medication regimen.  Follow pressures.       Renal insufficiency    Last creatinine 1.26.  Follow.  Avoid antiinflammatories.        Stasis ulcer    Has been followed at the wound clinic.  Now seeing Dr Ola Spurr.  Healing.          I spent 25 minutes with the patient and more than 50% of the time was spent in consultation regarding the above.     [name], MD

## 2015-03-22 NOTE — Progress Notes (Signed)
Pre visit review using our clinic review tool, if applicable. No additional management support is needed unless otherwise documented below in the visit note. 

## 2015-03-23 ENCOUNTER — Encounter: Payer: Self-pay | Admitting: Internal Medicine

## 2015-03-23 ENCOUNTER — Telehealth: Payer: Self-pay | Admitting: *Deleted

## 2015-03-23 ENCOUNTER — Ambulatory Visit (INDEPENDENT_AMBULATORY_CARE_PROVIDER_SITE_OTHER): Payer: Medicare Other | Admitting: Internal Medicine

## 2015-03-23 VITALS — BP 130/70 | HR 70 | Temp 97.4°F | Ht 62.0 in | Wt 122.0 lb

## 2015-03-23 DIAGNOSIS — L03119 Cellulitis of unspecified part of limb: Secondary | ICD-10-CM | POA: Diagnosis not present

## 2015-03-23 DIAGNOSIS — N289 Disorder of kidney and ureter, unspecified: Secondary | ICD-10-CM

## 2015-03-23 DIAGNOSIS — L989 Disorder of the skin and subcutaneous tissue, unspecified: Secondary | ICD-10-CM | POA: Insufficient documentation

## 2015-03-23 DIAGNOSIS — I10 Essential (primary) hypertension: Secondary | ICD-10-CM | POA: Diagnosis not present

## 2015-03-23 LAB — BASIC METABOLIC PANEL
BUN: 17 mg/dL (ref 6–23)
CO2: 27 meq/L (ref 19–32)
CREATININE: 1 mg/dL (ref 0.40–1.20)
Calcium: 8.9 mg/dL (ref 8.4–10.5)
Chloride: 100 mEq/L (ref 96–112)
GFR: 55.14 mL/min — AB (ref >60.00)
Glucose, Bld: 94 mg/dL (ref 70–99)
POTASSIUM: 4.3 meq/L (ref 3.5–5.1)
Sodium: 133 mEq/L — ABNORMAL LOW (ref 135–145)

## 2015-03-23 NOTE — Assessment & Plan Note (Signed)
Decreased erythema.  No oozing.  Feel the redness mostly related to stasis dermatitis.  Better after restarting triamcinolone cream.  Continue to hold abx.  Will see Dr Ola Spurr Monday 03/26/15.

## 2015-03-23 NOTE — Assessment & Plan Note (Signed)
Blood pressure has been doing well.  Same medication regimen.  Follow.  

## 2015-03-23 NOTE — Assessment & Plan Note (Signed)
Blood pressure has been doing well.  Same medication regimen.  Follow pressures.   

## 2015-03-23 NOTE — Assessment & Plan Note (Signed)
Has been followed at the wound clinic.  Now seeing Dr Ola Spurr.  Healing.

## 2015-03-23 NOTE — Progress Notes (Signed)
Pre visit review using our clinic review tool, if applicable. No additional management support is needed unless otherwise documented below in the visit note. 

## 2015-03-23 NOTE — Assessment & Plan Note (Signed)
Stay hydrated.  Recheck metabolic panel today.   

## 2015-03-23 NOTE — Addendum Note (Signed)
Addended by: Alisa Graff on: 03/23/2015 01:28 PM   Modules accepted: Orders

## 2015-03-23 NOTE — Assessment & Plan Note (Signed)
Saw vascular surgery.  Ultrasound negative for DVT per pts sister.  Off eliquis.  Follow.

## 2015-03-23 NOTE — Progress Notes (Addendum)
Patient ID: Kayla Galloway, female   DOB: 26-Aug-1920, 81 y.o.   MRN: [id number]   Subjective:    Patient ID: Kayla Galloway, female    DOB: [birth year], 81 y.o.   MRN: [id number]  HPI  Patient here for a scheduled follow up.  Was seen yesterday for leg swelling, redness and pain.  She was started back on triamcinolone cream yesterday.  Was asked to come in today for follow up.  I held on starting abx yesterday.  She comes in today accompanied by her sister. History obtained from both of them.  Legs are better. She is wearing her hose.  Swelling is better.  Redness is better.  Still some occasional burning pain.  She is eating and drinking.  Instructed to stay hydrated.    Past Medical History  Diagnosis Date  . Arthritis   . Osteoporosis   . Hypercholesterolemia   . Anemia   . GERD (gastroesophageal reflux disease)   . DVT (deep venous thrombosis), left     bilateral, IVC filter 2010  . Depression   . Gout   . Nephrolithiasis   . 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 Renal vein thrombosis     previous renal insufficiency  . Hypertension     Dr. [name]  . Cancer     skin ca lesion of scalp- removed  . Peripheral vascular disease   . Spider veins   . Obesity   . Hyperkalemia     Outpatient Encounter Prescriptions as of 03/23/2015  Medication Sig  . acetaminophen (TYLENOL) 650 MG CR tablet Take 650 mg by mouth every 8 (eight) hours as needed for pain.   Marland Kitchen apixaban (ELIQUIS) 5 MG TABS tablet Take 1 tablet (5 mg total) by mouth 2 (two) times daily.  . bifidobacterium infantis (ALIGN) capsule Take 1 capsule by mouth daily.  . Calcium Carb-Cholecalciferol (CALCIUM 600 + D PO) Take 1 tablet by mouth 3 (three) times daily.  . clindamycin (CLEOCIN) 300 MG capsule Take 1 capsule (300 mg total) by mouth 3 (three) times daily.  . furosemide (LASIX) 20 MG tablet Take 1 tablet (20 mg total) by mouth daily. (Patient taking differently: Take  20 mg by mouth daily. )  . hydrocerin (EUCERIN) CREA Apply 1 application topically 2 (two) times daily.  Marland Kitchen HYDROcodone-acetaminophen (NORCO/VICODIN) 5-325 MG per tablet Take 1 tablet by mouth daily.   . hydrOXYzine (ATARAX/VISTARIL) 10 MG tablet q day to bid prn  . Misc Natural Products (OSTEO BI-FLEX JOINT SHIELD PO) Take 1 tablet by mouth 2 (two) times daily.  Marland Kitchen omeprazole (PRILOSEC) 20 MG capsule Take 1 capsule by mouth  daily (Patient taking differently: Take 20 mg by mouth daily. )  . ramipril (ALTACE) 5 MG capsule Take 1 capsule by mouth  daily (Patient taking differently: Take 5 mg by mouth daily. )  . sertraline (ZOLOFT) 50 MG tablet Take 1 tablet by mouth  daily (Patient taking differently: Take 50 mg by mouth daily. )  . triamcinolone cream (KENALOG) 0.5 % Apply 1 application topically 2 (two) times daily.   No facility-administered encounter medications on file as of 03/23/2015.    Review of Systems  Constitutional: Negative for appetite change and unexpected weight change.  HENT: Negative for congestion and sinus pressure.   Respiratory: Negative for cough, chest tightness and shortness of breath.   Cardiovascular: Positive for leg swelling. Negative for chest pain and palpitations.  Gastrointestinal: Positive for diarrhea (see yesterday's note.  ). Negative for nausea, vomiting and abdominal pain.  Skin:       Decreased erythema.  No oozing.    Neurological: Negative for dizziness, light-headedness and headaches.  Psychiatric/Behavioral: Negative for dysphoric mood and agitation.       Objective:    Physical Exam  Constitutional: No distress.  HENT:  Nose: Nose normal.  Mouth/Throat: Oropharynx is clear and moist.  Cardiovascular: Normal rate and regular rhythm.   Pulmonary/Chest: Breath sounds normal. No respiratory distress. She has no wheezes.  Abdominal: Bowel sounds are normal.  Musculoskeletal:  Swelling has improved.  Decreased erythema.    Skin:  Erythema  improved.  No oozing.   Psychiatric: She has a normal mood and affect. Her behavior is normal.    BP 130/70 mmHg  Pulse 70  Temp(Src) 97.4 F (36.3 C) (Oral)  Ht 5\' 2"  (1.575 m)  Wt 122 lb (55.339 kg)  BMI 22.31 kg/m2  SpO2 98% Wt Readings from Last 3 Encounters:  03/23/15 122 lb (55.339 kg)  03/22/15 123 lb 4 oz (55.906 kg)  02/01/15 117 lb 8 oz (53.298 kg)     Lab Results  Component Value Date   WBC 7.1 01/29/2015   HGB 11.0* 01/29/2015   HCT 32.7* 01/29/2015   PLT 219 01/29/2015   GLUCOSE 108* 01/29/2015   CHOL 229* 07/12/2012   TRIG 87.0 07/12/2012   HDL 86.30 07/12/2012   LDLDIRECT 135.9 07/12/2012   ALT 15 01/29/2015   AST 25 01/29/2015   NA 131* 01/29/2015   K 4.5 01/29/2015   CL 97* 01/29/2015   CREATININE 1.26* 01/29/2015   BUN 29* 01/29/2015   CO2 26 01/29/2015   TSH 1.72 04/10/2014   INR 1.00 01/24/2015    Dg Chest 2 View  01/29/2015   CLINICAL DATA:  Acute onset of chest pain just below the sternum. Status post fall. Initial encounter.  EXAM: CHEST  2 VIEW  COMPARISON:  Chest radiograph performed 08/05/2012  FINDINGS: The lungs are well-aerated. Peribronchial thickening is noted. Mild peripheral scarring is noted near the lung apices. There is no evidence of pleural effusion or pneumothorax.  The heart is normal in size; the mediastinal contour is within normal limits. No acute osseous abnormalities are seen. The patient is status post vertebroplasty at the mid and lower thoracic spine. Multiple chronic compression deformities are noted along the mid and lower thoracic spine. An IVC filter is noted in grossly unchanged location.  IMPRESSION: 1. Chronic peribronchial thickening noted. Mild peripheral scarring at the lung apices. Lungs otherwise grossly clear. No displaced rib fracture seen. 2. Multiple chronic compression deformities along the mid and lower thoracic spine, with underlying changes of vertebroplasty at several levels.   Electronically Signed   By:  Garald Balding M.D.   On: 01/29/2015 20:38       Assessment & Plan:   Problem List Items Addressed This Visit    Cellulitis    Decreased erythema.  No oozing.  Feel the redness mostly related to stasis dermatitis.  Better after restarting triamcinolone cream.  Continue to hold abx.  Will see Dr Ola Spurr Monday 03/26/15.        Hypertension    Blood pressure has been doing well.  Same medication regimen.  Follow.        Lesion of neck    Persistent lesion of neck.  Refer to dermatology.        Relevant Orders   Ambulatory  referral to Dermatology   Renal insufficiency - Primary    Stay hydrated.  Recheck metabolic panel today.       Relevant Orders   Basic metabolic panel       [name], MD

## 2015-03-23 NOTE — Telephone Encounter (Signed)
Brandi with Dr. Ola Spurr office returned your call. She said that she will be in the office all day if you need to call her back. I also gave her an update on Ms. Emmer.

## 2015-03-23 NOTE — Assessment & Plan Note (Signed)
Persistent lesion of neck.  Refer to dermatology.

## 2015-03-23 NOTE — Assessment & Plan Note (Signed)
Is being followed by Dr Ola Spurr.  Just evaluated last week.  Leg worsened today.  More redness.  Appears to have venostasis changes.  Will restart triamcinolone cream.  Unclear if some infection present.  Concern with increased redness and pain.  Will start with the cream.  Call tomorrow.  May need to reevaluate tomorrow.  Was given rx for clindamycin to hold and will see if needs.  Elevate legs.  Needs to wear compression hose.

## 2015-03-23 NOTE — Telephone Encounter (Signed)
Daughter [name]) called with an update. Ms. Coyle legs looks a shade lighter & a little bit better after using the cream yesterday evening & last night.   Daughter was notified to bring Ms. Rullo back to the office today at noon to recheck leg before the weekend. Was also instructed to keep legs elevated & wear compression hose. Daughter verbalized understanding & appt scheduled.

## 2015-03-23 NOTE — Assessment & Plan Note (Signed)
Last creatinine 1.26.  Follow.  Avoid antiinflammatories.

## 2015-03-25 ENCOUNTER — Encounter: Payer: Self-pay | Admitting: *Deleted

## 2015-03-26 DIAGNOSIS — I890 Lymphedema, not elsewhere classified: Secondary | ICD-10-CM | POA: Diagnosis not present

## 2015-03-26 DIAGNOSIS — L02419 Cutaneous abscess of limb, unspecified: Secondary | ICD-10-CM | POA: Diagnosis not present

## 2015-03-26 DIAGNOSIS — I8312 Varicose veins of left lower extremity with inflammation: Secondary | ICD-10-CM | POA: Diagnosis not present

## 2015-03-26 DIAGNOSIS — L03119 Cellulitis of unspecified part of limb: Secondary | ICD-10-CM | POA: Diagnosis not present

## 2015-03-27 DIAGNOSIS — F329 Major depressive disorder, single episode, unspecified: Secondary | ICD-10-CM | POA: Diagnosis not present

## 2015-03-27 DIAGNOSIS — M8900 Algoneurodystrophy, unspecified site: Secondary | ICD-10-CM | POA: Diagnosis not present

## 2015-03-27 DIAGNOSIS — Z86718 Personal history of other venous thrombosis and embolism: Secondary | ICD-10-CM | POA: Diagnosis not present

## 2015-03-27 DIAGNOSIS — D649 Anemia, unspecified: Secondary | ICD-10-CM | POA: Diagnosis not present

## 2015-03-27 DIAGNOSIS — L03115 Cellulitis of right lower limb: Secondary | ICD-10-CM | POA: Diagnosis not present

## 2015-03-27 DIAGNOSIS — I10 Essential (primary) hypertension: Secondary | ICD-10-CM | POA: Diagnosis not present

## 2015-03-27 DIAGNOSIS — Z4800 Encounter for change or removal of nonsurgical wound dressing: Secondary | ICD-10-CM | POA: Diagnosis not present

## 2015-03-27 DIAGNOSIS — L03116 Cellulitis of left lower limb: Secondary | ICD-10-CM | POA: Diagnosis not present

## 2015-03-27 DIAGNOSIS — E785 Hyperlipidemia, unspecified: Secondary | ICD-10-CM | POA: Diagnosis not present

## 2015-03-27 DIAGNOSIS — I739 Peripheral vascular disease, unspecified: Secondary | ICD-10-CM | POA: Diagnosis not present

## 2015-03-27 DIAGNOSIS — K219 Gastro-esophageal reflux disease without esophagitis: Secondary | ICD-10-CM | POA: Diagnosis not present

## 2015-03-27 DIAGNOSIS — M109 Gout, unspecified: Secondary | ICD-10-CM | POA: Diagnosis not present

## 2015-03-30 ENCOUNTER — Other Ambulatory Visit: Payer: Self-pay

## 2015-03-30 MED ORDER — TRIAMCINOLONE ACETONIDE 0.5 % EX CREA: 1.0000 "application " | TOPICAL_CREAM | Freq: Two times a day (BID) | CUTANEOUS | Status: DC

## 2015-07-10 DIAGNOSIS — I8312 Varicose veins of left lower extremity with inflammation: Secondary | ICD-10-CM | POA: Diagnosis not present

## 2015-07-10 DIAGNOSIS — L821 Other seborrheic keratosis: Secondary | ICD-10-CM | POA: Diagnosis not present

## 2015-07-10 DIAGNOSIS — I8311 Varicose veins of right lower extremity with inflammation: Secondary | ICD-10-CM | POA: Diagnosis not present

## 2015-09-02 ENCOUNTER — Emergency Department: Payer: Medicare Other

## 2015-09-02 ENCOUNTER — Encounter: Payer: Self-pay | Admitting: Emergency Medicine

## 2015-09-02 ENCOUNTER — Emergency Department
Admission: EM | Admit: 2015-09-02 | Discharge: 2015-09-02 | Disposition: A | Discharge status: Home / Self Care | Payer: Medicare Other | Attending: Emergency Medicine | Admitting: Emergency Medicine

## 2015-09-02 DIAGNOSIS — S0181XA Laceration without foreign body of other part of head, initial encounter: Secondary | ICD-10-CM | POA: Insufficient documentation

## 2015-09-02 DIAGNOSIS — Z880 Allergy status to penicillin: Secondary | ICD-10-CM | POA: Insufficient documentation

## 2015-09-02 DIAGNOSIS — S6992XA Unspecified injury of left wrist, hand and finger(s), initial encounter: Secondary | ICD-10-CM | POA: Diagnosis not present

## 2015-09-02 DIAGNOSIS — S52592A Other fractures of lower end of left radius, initial encounter for closed fracture: Secondary | ICD-10-CM | POA: Insufficient documentation

## 2015-09-02 DIAGNOSIS — I10 Essential (primary) hypertension: Secondary | ICD-10-CM | POA: Diagnosis not present

## 2015-09-02 DIAGNOSIS — S0990XA Unspecified injury of head, initial encounter: Secondary | ICD-10-CM | POA: Diagnosis not present

## 2015-09-02 DIAGNOSIS — Y92009 Unspecified place in unspecified non-institutional (private) residence as the place of occurrence of the external cause: Secondary | ICD-10-CM | POA: Insufficient documentation

## 2015-09-02 DIAGNOSIS — W19XXXA Unspecified fall, initial encounter: Secondary | ICD-10-CM | POA: Diagnosis not present

## 2015-09-02 DIAGNOSIS — Z79899 Other long term (current) drug therapy: Secondary | ICD-10-CM | POA: Diagnosis not present

## 2015-09-02 DIAGNOSIS — Y998 Other external cause status: Secondary | ICD-10-CM | POA: Diagnosis not present

## 2015-09-02 DIAGNOSIS — Z792 Long term (current) use of antibiotics: Secondary | ICD-10-CM | POA: Diagnosis not present

## 2015-09-02 DIAGNOSIS — S52502A Unspecified fracture of the lower end of left radius, initial encounter for closed fracture: Secondary | ICD-10-CM | POA: Diagnosis not present

## 2015-09-02 DIAGNOSIS — S0993XA Unspecified injury of face, initial encounter: Secondary | ICD-10-CM | POA: Diagnosis not present

## 2015-09-02 DIAGNOSIS — S51012A Laceration without foreign body of left elbow, initial encounter: Secondary | ICD-10-CM | POA: Diagnosis not present

## 2015-09-02 DIAGNOSIS — Y9389 Activity, other specified: Secondary | ICD-10-CM | POA: Diagnosis not present

## 2015-09-02 DIAGNOSIS — W07XXXA Fall from chair, initial encounter: Secondary | ICD-10-CM | POA: Insufficient documentation

## 2015-09-02 DIAGNOSIS — M549 Dorsalgia, unspecified: Secondary | ICD-10-CM | POA: Diagnosis not present

## 2015-09-02 DIAGNOSIS — S62102A Fracture of unspecified carpal bone, left wrist, initial encounter for closed fracture: Secondary | ICD-10-CM

## 2015-09-02 MED ORDER — ACETAMINOPHEN 500 MG PO TABS
1000.0000 mg | ORAL_TABLET | Freq: Once | ORAL | Status: AC
Start: 2015-09-02 — End: 2015-09-02
  Administered 2015-09-02: 1000 mg via ORAL
  Filled 2015-09-02: qty 2

## 2015-09-02 NOTE — ED Provider Notes (Signed)
Time Seen: Approximately 1309  I have reviewed the triage notes  Chief Complaint: Fall and Head Injury   History of Present Illness: Kayla Galloway is a 82 y.o. female who presents after a non-syncopal fall from a chair. She states that she does not feel that she lost  consciousness. The patient put out her arm to stop her fall. She apparently was working on something on the counter and had one leg up on a stool and lost her balance and fell to the left. She is complaining of a head trauma primarily left side along with a obvious injury to her left wrist. Patient denies any hip pain, chest pain, abdominal pain, or any other new concerns.   Past Medical History  Diagnosis Date  . Arthritis   . Osteoporosis   . Hypercholesterolemia   . Anemia   . GERD (gastroesophageal reflux disease)   . DVT (deep venous thrombosis), left     bilateral, IVC filter 2010  . Depression   . Gout   . Nephrolithiasis   . 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 Renal vein thrombosis (HCC)     previous renal insufficiency  . Hypertension     Dr. [name]  . Cancer ([place])     skin ca lesion of scalp- removed  . Peripheral vascular disease ([place])   . Spider veins   . Obesity   . Hyperkalemia     Patient Active Problem List   Diagnosis Date Noted  . Lesion of neck 03/23/2015  . Laceration of face 02/05/2015  . Chest pain 02/05/2015  . Hyperkalemia   . DVT (deep venous thrombosis) ([place]) 01/26/2015  . ARF (acute renal failure) ([place]) 01/26/2015  . Cellulitis and abscess of foot 01/24/2015  . Cellulitis 01/12/2015  . Laceration of left leg 07/24/2014  . Laceration of left upper extremity 07/24/2014  . Weight loss 04/11/2014  . Urinary tract infectious disease 04/06/2014  . Rash, drug 07/18/2013  . Stasis ulcer ([place]) 07/07/2013  . Anemia 06/28/2012  . Hypertension 06/28/2012  . Hypercholesterolemia 06/28/2012  . Renal insufficiency 06/28/2012  .  Osteoporosis 06/28/2012    Past Surgical History  Procedure Laterality Date  . Fracture surgery  2009    hip  . Right oophorectomy      partial-  . Eye surgery      bil eyes  . Colonsopy    . Tonsillectomy      age 44  . Skin cancer excision      top of head  . Ivc filter      pt states it has turned side ways and could not be removed.  . Vertebroplasty  12/31/2011    Procedure: VERTEBROPLASTY;  Surgeon: Winfield Cunas, MD;  Location: [facility];  Service: Neurosurgery;  Laterality: N/A;  Thoracic eleven vertebroplasty  . Ectopic pregnancy surgery    . Hemorrhoid surgery    . Cardiac catheterization      2010 Does not see a cardiac  doctor  . Dental surgery    . Kyphoplasty  09/03/2012    Procedure: KYPHOPLASTY;  Surgeon: Winfield Cunas, MD;  Location: [facility];  Service: Neurosurgery;  Laterality: N/A;  Thoracic eight Kyphoplasty    Past Surgical History  Procedure Laterality Date  . Fracture surgery  2009    hip  . Right oophorectomy      partial-  . Eye surgery  bil eyes  . Colonsopy    . Tonsillectomy      age 89  . Skin cancer excision      top of head  . Ivc filter      pt states it has turned side ways and could not be removed.  . Vertebroplasty  12/31/2011    Procedure: VERTEBROPLASTY;  Surgeon: Winfield Cunas, MD;  Location: [facility];  Service: Neurosurgery;  Laterality: N/A;  Thoracic eleven vertebroplasty  . Ectopic pregnancy surgery    . Hemorrhoid surgery    . Cardiac catheterization      2010 Does not see a cardiac  doctor  . Dental surgery    . Kyphoplasty  09/03/2012    Procedure: KYPHOPLASTY;  Surgeon: Winfield Cunas, MD;  Location: [facility];  Service: Neurosurgery;  Laterality: N/A;  Thoracic eight Kyphoplasty    Current Outpatient Rx  Name  Route  Sig  Dispense  Refill  . acetaminophen (TYLENOL) 650 MG CR tablet   Oral   Take 650 mg by mouth every 8 (eight) hours as needed for pain.          Marland Kitchen apixaban (ELIQUIS) 5 MG TABS  tablet   Oral   Take 1 tablet (5 mg total) by mouth 2 (two) times daily.   60 tablet   0   . bifidobacterium infantis (ALIGN) capsule   Oral   Take 1 capsule by mouth daily.   30 capsule   0     Take probiotic while on antibiotic   . Calcium Carb-Cholecalciferol (CALCIUM 600 + D PO)   Oral   Take 1 tablet by mouth 3 (three) times daily.         . clindamycin (CLEOCIN) 300 MG capsule   Oral   Take 1 capsule (300 mg total) by mouth 3 (three) times daily.   21 capsule   0   . furosemide (LASIX) 20 MG tablet      Take 1 tablet (20 mg total) by mouth daily. Patient taking differently: Take 20 mg by mouth daily.    30 tablet   5   . hydrocerin (EUCERIN) CREA   Topical   Apply 1 application topically 2 (two) times daily.   113 g   1   . HYDROcodone-acetaminophen (NORCO/VICODIN) 5-325 MG per tablet   Oral   Take 1 tablet by mouth daily.          . hydrOXYzine (ATARAX/VISTARIL) 10 MG tablet      q day to bid prn   30 tablet   0   . Misc Natural Products (OSTEO BI-FLEX JOINT SHIELD PO)   Oral   Take 1 tablet by mouth 2 (two) times daily.         Marland Kitchen omeprazole (PRILOSEC) 20 MG capsule      Take 1 capsule by mouth  daily Patient taking differently: Take 20 mg by mouth daily.    90 capsule   0   . ramipril (ALTACE) 5 MG capsule      Take 1 capsule by mouth  daily Patient taking differently: Take 5 mg by mouth daily.    90 capsule   0   . sertraline (ZOLOFT) 50 MG tablet      Take 1 tablet by mouth  daily Patient taking differently: Take 50 mg by mouth daily.    90 tablet   0   . triamcinolone cream (KENALOG) 0.5 %   Topical  Apply 1 application topically 2 (two) times daily.   30 g   1     Allergies:  Cefuroxime axetil; Doxycycline; Advil; Celebrex; Daypro; Etodolac; Macrobid; Penicillins; Percocet; Tramadol; Valium; Neosporin; and Vicodin  Family History: Family History  Problem Relation Age of Onset  . Anesthesia problems Neg Hx   .  Heart disease Father     myocardial infarction  . Heart disease Brother     myocardial infarction  . Lymphoma Sister   . Breast cancer Neg Hx   . Colon cancer Neg Hx     Social History: Social History  Substance Use Topics  . Smoking status: Never Smoker   . Smokeless tobacco: Never Used  . Alcohol Use: No     Review of Systems:   10 point review of systems was performed and was otherwise negative:  Constitutional: No fever Eyes: No visual disturbances ENT: No sore throat, ear pain Cardiac: No chest pain Respiratory: No shortness of breath, wheezing, or stridor Abdomen: No abdominal pain, no vomiting, No diarrhea Endocrine: No weight loss, No night sweats Extremities: No peripheral edema, cyanosis Skin: No rashes, easy bruising Neurologic: No focal weakness, trouble with speech or swollowing Urologic: No dysuria, Hematuria, or urinary frequency   Physical Exam:  ED Triage Vitals  Enc Vitals Group     BP 09/02/15 1310 174/68 mmHg     Pulse Rate 09/02/15 1310 73     Resp 09/02/15 1310 20     Temp 09/02/15 1310 97.5 F (36.4 C)     Temp Source 09/02/15 1310 Oral     SpO2 09/02/15 1310 100 %     Weight 09/02/15 1310 132 lb 14.4 oz (60.283 kg)     Height 09/02/15 1310 5\' 1"  (1.549 m)     Head Cir --      Peak Flow --      Pain Score 09/02/15 1311 7     Pain Loc --      Pain Edu? --      Excl. in [place]? --     General: Awake , Alert , and Oriented times 3; GCS 15 Head: Patient has a vertical cut beside her left eye without any crepitus or step-off noted. It's 2 cm in size with some separation in the subcutaneous tissue Eyes: Pupils equal , round, reactive to light Nose/Throat: No nasal drainage, patent upper airway without erythema or exudate.  Neck: Supple, Full range of motion, No anterior adenopathy or palpable thyroid masses Lungs: Clear to ascultation without wheezes , rhonchi, or rales Heart: Regular rate, regular rhythm without murmurs , gallops , or  rubs Abdomen: Soft, non tender without rebound, guarding , or rigidity; bowel sounds positive and symmetric in all 4 quadrants. No organomegaly .        Extremities: Obvious deformity to the left wrist with tenderness and some mild crepitus. She is able to grasp with her left hand and is otherwise neurovascularly intact with good radial and ulnar pulses. She has a skin tear on the outside surface of her left elbow but has good flexion extension or supination without any significant discomfort Neurologic: normal ambulation, Motor symmetric without deficits, sensory intact Skin: warm, dry, no rashes   EKG:  ED ECG REPORT I, Daymon Larsen, the attending physician, personally viewed and interpreted this ECG.  Date: 09/02/2015 EKG Time: 1316 Rate: 71 Rhythm: normal sinus rhythm QRS Axis: normal Intervals: normal ST/T Wave abnormalities: Left bundle branch block pattern Conduction Disutrbances: none Narrative  Interpretation: unremarkable No acute ischemic changes   Radiology:  EXAM: LEFT WRIST - COMPLETE 3+ VIEW  COMPARISON: None  FINDINGS: Diffuse osseous demineralization.  Comminuted transverse metaphyseal fracture distal LEFT radius with dorsal displacement and apex volar angulation.  No definite intra-articular extension.  A longitudinal fracture plane is seen extending proximally within the distal LEFT radial meta diaphysis.  Chondrocalcinosis at triangular fibrocartilage complex.  Narrowing of radiocarpal joint.  Ulna grossly intact.  Degenerative changes first CMC joint.  Associated soft tissue swelling.  No additional fracture dislocation identified.  IMPRESSION: Comminuted displaced and angulated distal LEFT radial metaphyseal fracture as above.     Narrative:   CLINICAL DATA: Patient fell out of chair today with possible loss of consciousness. Laceration left periportal region.  EXAM: CT HEAD WITHOUT CONTRAST  TECHNIQUE: Contiguous axial  images were obtained from the base of the skull through the vertex without intravenous contrast.  COMPARISON: 01/03/2009  FINDINGS: Ventricles, cisterns and other CSF spaces are within normal. There is mild to moderate chronic ischemic microvascular disease. Basal ganglia calcifications are present. There is no mass, mass effect, shift of midline structures or acute hemorrhage. There is no evidence of acute infarction. There is moderate opacification of the left maxillary sinus with mucoperiosteal thickening compatible with chronic inflammatory disease. Remaining bones and soft tissues are within normal.  IMPRESSION: No acute intracranial findings.  Chronic ischemic microvascular disease.  Chronic inflammatory disease involving the left maxillary sinus.        I personally reviewed the radiologic studies    ED Course:  The patient was found to have a comminuted left wrist fracture though it does not appear by radiology evaluation by myself and requires acute reduction. I felt she could be adequately treated with a sugar tong splint along with a left arm sling with follow-up to orthopedics. The patient's laceration with her frail skin would likely not tolerate suture realignment and the patient had realignment with of skin glue here in the emergency department after wound was appropriately cleaned. Patient appeared to tolerate the procedure well and states that her tetanus status is up-to-date.   Assessment: * Left wrist closed fracture Left facial laceration 2 cm closure with skin glue Left elbow skin tear Acute closed head injury   Final Clinical Impression:   Final diagnoses:  Wrist fracture, left, closed, initial encounter  Head trauma, initial encounter     Plan:  Outpatient management Patient was advised to return immediately if condition worsens. Patient was advised to follow up with their primary care physician or other specialized physicians involved in  their outpatient care            Daymon Larsen, MD 09/02/15 [phone]

## 2015-09-02 NOTE — Discharge Instructions (Signed)
Concussion, Adult A concussion is a brain injury. It is caused by:  A hit to the head.  A quick and sudden movement (jolt) of the head or neck. A concussion is usually not life threatening. Even so, it can cause serious problems. If you had a concussion before, you may have concussion-like problems after a hit to your head. HOME CARE General Instructions  Follow your doctor's directions carefully.  Take medicines only as told by your doctor.  Only take medicines your doctor says are safe.  Do not drink alcohol until your doctor says it is okay. Alcohol and some drugs can slow down healing. They can also put you at risk for further injury.  If you are having trouble remembering things, write them down.  Try to do one thing at a time if you get distracted easily. For example, do not watch TV while making dinner.  Talk to your family members or close friends when making important decisions.  Follow up with your doctor as told.  Watch your symptoms. Tell others to do the same. Serious problems can sometimes happen after a concussion. Older adults are more likely to have these problems.  Tell your teachers, school nurse, school counselor, coach, Product/process development scientist, or work Freight forwarder about your concussion. Tell them about what you can or cannot do. They should watch to see if:  It gets even harder for you to pay attention or concentrate.  It gets even harder for you to remember things or learn new things.  You need more time than normal to finish things.  You become annoyed (irritable) more than before.  You are not able to deal with stress as well.  You have more problems than before.  Rest. Make sure you:  Get plenty of sleep at night.  Go to sleep early.  Go to bed at the same time every day. Try to wake up at the same time.  Rest during the day.  Take naps when you feel tired.  Limit activities where you have to think a lot or concentrate. These include:  Doing  homework.  Doing work related to a job.  Watching TV.  Using the computer. Returning To Your Regular Activities Return to your normal activities slowly, not all at once. You must give your body and brain enough time to heal.   Do not play sports or do other athletic activities until your doctor says it is okay.  Ask your doctor when you can drive, ride a bicycle, or work other vehicles or machines. Never do these things if you feel dizzy.  Ask your doctor about when you can return to work or school. Preventing Another Concussion It is very important to avoid another brain injury, especially before you have healed. In rare cases, another injury can lead to permanent brain damage, brain swelling, or death. The risk of this is greatest during the first 7-10 days after your injury. Avoid injuries by:   Wearing a seat belt when riding in a car.  Not drinking too much alcohol.  Avoiding activities that could lead to a second concussion (such as contact sports).  Wearing a helmet when doing activities like:  Biking.  Skiing.  Skateboarding.  Skating.  Making your home safer by:  Removing things from the floor or stairways that could make you trip.  Using grab bars in bathrooms and handrails by stairs.  Placing non-slip mats on floors and in bathtubs.  Improve lighting in dark areas. GET HELP IF:  It gets even harder for you to pay attention or concentrate.  It gets even harder for you to remember things or learn new things.  You need more time than normal to finish things.  You become annoyed (irritable) more than before.  You are not able to deal with stress as well.  You have more problems than before.  You have problems keeping your balance.  You are not able to react quickly when you should. Get help if you have any of these problems for more than 2 weeks:   Lasting (chronic) headaches.  Dizziness or trouble balancing.  Feeling sick to your stomach  (nausea).  Seeing (vision) problems.  Being affected by noises or light more than normal.  Feeling sad, low, down in the dumps, blue, gloomy, or empty (depressed).  Mood changes (mood swings).  Feeling of fear or nervousness about what may happen (anxiety).  Feeling annoyed.  Memory problems.  Problems concentrating or paying attention.  Sleep problems.  Feeling tired all the time. GET HELP RIGHT AWAY IF:   You have bad headaches or your headaches get worse.  You have weakness (even if it is in one hand, leg, or part of the face).  You have loss of feeling (numbness).  You feel off balance.  You keep throwing up (vomiting).  You feel tired.  One black center of your eye (pupil) is larger than the other.  You twitch or shake violently (convulse).  Your speech is not clear (slurred).  You are more confused, easily angered (agitated), or annoyed than before.  You have more trouble resting than before.  You are unable to recognize people or places.  You have neck pain.  It is difficult to wake you up.  You have unusual behavior changes.  You pass out (lose consciousness). MAKE SURE YOU:   Understand these instructions.  Will watch your condition.  Will get help right away if you are not doing well or get worse.   This information is not intended to replace advice given to you by your health care provider. Make sure you discuss any questions you have with your health care provider.   Document Released: 07/23/2009 Document Revised: 08/25/2014 Document Reviewed: 02/24/2013 Elsevier Interactive Patient Education 2016 [place] or Splint Care Casts and splints support injured limbs and keep bones from moving while they heal.  HOME CARE  Keep the cast or splint uncovered during the drying period.  A plaster cast can take 24 to 48 hours to dry.  A fiberglass cast will dry in less than 1 hour.  Do not rest the cast on anything harder than a  pillow for 24 hours.  Do not put weight on your injured limb. Do not put pressure on the cast. Wait for your doctor's approval.  Keep the cast or splint dry.  Cover the cast or splint with a plastic bag during baths or wet weather.  If you have a cast over your chest and belly (trunk), take sponge baths until the cast is taken off.  If your cast gets wet, dry it with a towel or blow dryer. Use the cool setting on the blow dryer.  Keep your cast or splint clean. Wash a dirty cast with a damp cloth.  Do not put any objects under your cast or splint.  Do not scratch the skin under the cast with an object. If itching is a problem, use a blow dryer on a cool setting over the itchy  area.  Do not trim or cut your cast.  Do not take out the padding from inside your cast.  Exercise your joints near the cast as told by your doctor.  Raise (elevate) your injured limb on 1 or 2 pillows for the first 1 to 3 days. GET HELP IF:  Your cast or splint cracks.  Your cast or splint is too tight or too loose.  You itch badly under the cast.  Your cast gets wet or has a soft spot.  You have a bad smell coming from the cast.  You get an object stuck under the cast.  Your skin around the cast becomes red or sore.  You have new or more pain after the cast is put on. GET HELP RIGHT AWAY IF:  You have fluid leaking through the cast.  You cannot move your fingers or toes.  Your fingers or toes turn blue or white or are cool, painful, or puffy (swollen).  You have tingling or lose feeling (numbness) around the injured area.  You have bad pain or pressure under the cast.  You have trouble breathing or have shortness of breath.  You have chest pain.   This information is not intended to replace advice given to you by your health care provider. Make sure you discuss any questions you have with your health care provider.   Document Released: 12/04/2010 Document Revised: 04/06/2013 Document  Reviewed: 02/10/2013 Elsevier Interactive Patient Education 2016 Elsevier Inc[employer]  Wrist Fracture A wrist fracture is a break or crack in one of the bones of your wrist. Your wrist is made up of eight small bones at the palm of your hand (carpal bones) and two long bones that make up your forearm (radius and ulna). CAUSES  A direct blow to the wrist.  Falling on an outstretched hand.  Trauma, such as a car accident or a fall. RISK FACTORS Risk factors for wrist fracture include:  Participating in contact and high-risk sports, such as skiing, biking, and ice skating.  Taking steroid medicines.  Smoking.  Being female.  Being Caucasian.  Drinking more than three alcoholic beverages per day.  Having low or lowered bone density (osteoporosis or osteopenia).  Age. Older adults have decreased bone density.  Women who have had menopause.  History of previous fractures. SIGNS AND SYMPTOMS Symptoms of wrist fractures include tenderness, bruising, and inflammation. Additionally, the wrist may hang in an odd position or appear deformed. DIAGNOSIS Diagnosis may include:  Physical exam.  X-ray. TREATMENT Treatment depends on many factors, including the nature and location of the fracture, your age, and your activity level. Treatment for wrist fracture can be nonsurgical or surgical. Nonsurgical Treatment A plaster cast or splint may be applied to your wrist if the bone is in a good position. If the fracture is not in good position, it may be necessary for your health care provider to realign it before applying a splint or cast. Usually, a cast or splint will be worn for several weeks. Surgical Treatment Sometimes the position of the bone is so far out of place that surgery is required to apply a device to hold it together as it heals. Depending on the fracture, there are a number of options for holding the bone in place while it heals, such as a cast and metal pins. HOME CARE  INSTRUCTIONS  Keep your injured wrist elevated and move your fingers as much as possible.  Do not put pressure on any part of your  cast or splint. It may break.  Use a plastic bag to protect your cast or splint from water while bathing or showering. Do not lower your cast or splint into water.  Take medicines only as directed by your health care provider.  Keep your cast or splint clean and dry. If it becomes wet, damaged, or suddenly feels too tight, contact your health care provider right away.  Do not use any tobacco products including cigarettes, chewing tobacco, or electronic cigarettes. Tobacco can delay bone healing. If you need help quitting, ask your health care provider.  Keep all follow-up visits as directed by your health care provider. This is important.  Ask your health care provider if you should take supplements of calcium and vitamins C and D to promote bone healing. SEEK MEDICAL CARE IF:  Your cast or splint is damaged, breaks, or gets wet.  You have a fever.  You have chills.  You have continued severe pain or more swelling than you did before the cast was put on. SEEK IMMEDIATE MEDICAL CARE IF:  Your hand or fingernails on the injured arm turn blue or gray, or feel cold or numb.  You have decreased feeling in the fingers of your injured arm. MAKE SURE YOU:  Understand these instructions.  Will watch your condition.  Will get help right away if you are not doing well or get worse.   This information is not intended to replace advice given to you by your health care provider. Make sure you discuss any questions you have with your health care provider.   Document Released: 05/14/2005 Document Revised: 04/25/2015 Document Reviewed: 08/22/2011 Elsevier Interactive Patient Education [employer].   Please return immediately if condition worsens. Please contact her primary physician or the physician you were given for referral. If you have any  specialist physicians involved in her treatment and plan please also contact them. Thank you for using [facility].   Please call the orthopedic surgeon in the morning for follow-up as soon as possible. Keep the splint on as much as possible. Tylenol over-the-counter for pain.

## 2015-09-02 NOTE — ED Notes (Signed)
Patient brought in by [facility] from home for a fall. Patient fell out of a chair, possible LOC. Patient has obvious deformity to left wrist and a laceration beside left eye, bleeding controlled at this time. Patient is in NAD.

## 2015-09-03 ENCOUNTER — Other Ambulatory Visit: Payer: Self-pay

## 2015-09-03 ENCOUNTER — Telehealth: Payer: Self-pay | Admitting: Internal Medicine

## 2015-09-03 MED ORDER — FUROSEMIDE 20 MG PO TABS: ORAL_TABLET | ORAL | Status: DC

## 2015-09-03 MED ORDER — SERTRALINE HCL 50 MG PO TABS: ORAL_TABLET | ORAL | Status: DC

## 2015-09-03 MED ORDER — RAMIPRIL 5 MG PO CAPS: ORAL_CAPSULE | ORAL | Status: DC

## 2015-09-03 MED ORDER — OMEPRAZOLE 20 MG PO CPDR: DELAYED_RELEASE_CAPSULE | ORAL | Status: DC

## 2015-09-03 NOTE — Telephone Encounter (Signed)
Spoke with the daughter. Patient needs refills sent to [facility], made that change for her.    Daughter wanted you to know that patient fell yesterday, fractured her arm and cut her face.  The ED used glue on her head and has her following up with Ortho on Wednesday for the fracture.  Just a FYI.  Thanks

## 2015-09-03 NOTE — Telephone Encounter (Signed)
Pt lvm stating that she is completely out of medicine. She said that she called Friday and she never got a response. She did not say what medicine's it was that she was out of. Mail order pharmacy.

## 2015-09-05 DIAGNOSIS — S52532A Colles' fracture of left radius, initial encounter for closed fracture: Secondary | ICD-10-CM | POA: Diagnosis not present

## 2015-09-06 ENCOUNTER — Ambulatory Visit: Payer: Medicare Other

## 2015-09-06 ENCOUNTER — Encounter
Admission: RE | Disposition: A | Discharge status: Home / Self Care | Payer: Self-pay | Source: Ambulatory Visit | Attending: Orthopedic Surgery

## 2015-09-06 ENCOUNTER — Ambulatory Visit: Payer: Medicare Other | Admitting: Anesthesiology

## 2015-09-06 ENCOUNTER — Encounter: Payer: Self-pay | Admitting: *Deleted

## 2015-09-06 ENCOUNTER — Ambulatory Visit
Admission: RE | Admit: 2015-09-06 | Discharge: 2015-09-06 | Disposition: A | Discharge status: Home / Self Care | Payer: Medicare Other | Source: Ambulatory Visit | Attending: Orthopedic Surgery | Admitting: Orthopedic Surgery

## 2015-09-06 DIAGNOSIS — K219 Gastro-esophageal reflux disease without esophagitis: Secondary | ICD-10-CM | POA: Diagnosis not present

## 2015-09-06 DIAGNOSIS — S52592A Other fractures of lower end of left radius, initial encounter for closed fracture: Secondary | ICD-10-CM | POA: Diagnosis not present

## 2015-09-06 DIAGNOSIS — S52502A Unspecified fracture of the lower end of left radius, initial encounter for closed fracture: Secondary | ICD-10-CM | POA: Diagnosis not present

## 2015-09-06 DIAGNOSIS — M1990 Unspecified osteoarthritis, unspecified site: Secondary | ICD-10-CM | POA: Diagnosis not present

## 2015-09-06 DIAGNOSIS — Z79899 Other long term (current) drug therapy: Secondary | ICD-10-CM | POA: Diagnosis not present

## 2015-09-06 DIAGNOSIS — Z880 Allergy status to penicillin: Secondary | ICD-10-CM | POA: Diagnosis not present

## 2015-09-06 DIAGNOSIS — Z8781 Personal history of (healed) traumatic fracture: Secondary | ICD-10-CM

## 2015-09-06 DIAGNOSIS — Z888 Allergy status to other drugs, medicaments and biological substances status: Secondary | ICD-10-CM | POA: Diagnosis not present

## 2015-09-06 DIAGNOSIS — I10 Essential (primary) hypertension: Secondary | ICD-10-CM | POA: Diagnosis not present

## 2015-09-06 DIAGNOSIS — Z881 Allergy status to other antibiotic agents status: Secondary | ICD-10-CM | POA: Diagnosis not present

## 2015-09-06 DIAGNOSIS — Z885 Allergy status to narcotic agent status: Secondary | ICD-10-CM | POA: Diagnosis not present

## 2015-09-06 DIAGNOSIS — S52572A Other intraarticular fracture of lower end of left radius, initial encounter for closed fracture: Secondary | ICD-10-CM | POA: Insufficient documentation

## 2015-09-06 DIAGNOSIS — D649 Anemia, unspecified: Secondary | ICD-10-CM | POA: Insufficient documentation

## 2015-09-06 DIAGNOSIS — Z86718 Personal history of other venous thrombosis and embolism: Secondary | ICD-10-CM | POA: Insufficient documentation

## 2015-09-06 DIAGNOSIS — S52532A Colles' fracture of left radius, initial encounter for closed fracture: Secondary | ICD-10-CM | POA: Diagnosis not present

## 2015-09-06 DIAGNOSIS — I739 Peripheral vascular disease, unspecified: Secondary | ICD-10-CM | POA: Insufficient documentation

## 2015-09-06 DIAGNOSIS — W19XXXA Unspecified fall, initial encounter: Secondary | ICD-10-CM | POA: Insufficient documentation

## 2015-09-06 DIAGNOSIS — Z9889 Other specified postprocedural states: Secondary | ICD-10-CM

## 2015-09-06 HISTORY — PX: OPEN REDUCTION INTERNAL FIXATION (ORIF) DISTAL RADIAL FRACTURE: SHX5989

## 2015-09-06 SURGERY — OPEN REDUCTION INTERNAL FIXATION (ORIF) DISTAL RADIUS FRACTURE
Anesthesia: General | Site: Arm Lower | Laterality: Left | Wound class: Clean

## 2015-09-06 MED ORDER — FENTANYL CITRATE (PF) 100 MCG/2ML IJ SOLN
INTRAMUSCULAR | Status: AC
  Filled 2015-09-06: qty 2

## 2015-09-06 MED ORDER — ACETAMINOPHEN 500 MG PO TABS
ORAL_TABLET | ORAL | Status: AC
  Administered 2015-09-06: 500 mg via ORAL
  Filled 2015-09-06: qty 1

## 2015-09-06 MED ORDER — NEOMYCIN-POLYMYXIN B GU 40-200000 IR SOLN
Status: AC
  Filled 2015-09-06: qty 2

## 2015-09-06 MED ORDER — ACETAMINOPHEN 500 MG PO TABS
500.0000 mg | ORAL_TABLET | ORAL | Status: AC
  Administered 2015-09-06: 500 mg via ORAL

## 2015-09-06 MED ORDER — NEOMYCIN-POLYMYXIN B GU 40-200000 IR SOLN
Status: DC | PRN
  Administered 2015-09-06: 2 mL

## 2015-09-06 MED ORDER — ONDANSETRON HCL 4 MG PO TABS: 4.0000 mg | ORAL_TABLET | Freq: Four times a day (QID) | ORAL | Status: DC | PRN

## 2015-09-06 MED ORDER — DEXAMETHASONE SODIUM PHOSPHATE 10 MG/ML IJ SOLN
INTRAMUSCULAR | Status: DC | PRN
  Administered 2015-09-06: 10 mg via INTRAVENOUS

## 2015-09-06 MED ORDER — ONDANSETRON HCL 4 MG/2ML IJ SOLN: 4.0000 mg | Freq: Once | INTRAMUSCULAR | Status: DC | PRN

## 2015-09-06 MED ORDER — FENTANYL CITRATE (PF) 100 MCG/2ML IJ SOLN
INTRAMUSCULAR | Status: DC | PRN
  Administered 2015-09-06 (×2): 50 ug via INTRAVENOUS

## 2015-09-06 MED ORDER — METOCLOPRAMIDE HCL 10 MG PO TABS: 5.0000 mg | ORAL_TABLET | Freq: Three times a day (TID) | ORAL | Status: DC | PRN

## 2015-09-06 MED ORDER — CLINDAMYCIN PHOSPHATE 600 MG/50ML IV SOLN
INTRAVENOUS | Status: AC
  Administered 2015-09-06: 600 mg via INTRAVENOUS
  Filled 2015-09-06: qty 50

## 2015-09-06 MED ORDER — ONDANSETRON HCL 4 MG/2ML IJ SOLN: 4.0000 mg | Freq: Four times a day (QID) | INTRAMUSCULAR | Status: DC | PRN

## 2015-09-06 MED ORDER — LIDOCAINE HCL (CARDIAC) 20 MG/ML IV SOLN
INTRAVENOUS | Status: DC | PRN
  Administered 2015-09-06: 50 mg via INTRAVENOUS

## 2015-09-06 MED ORDER — SODIUM CHLORIDE 0.9 % IV SOLN: INTRAVENOUS | Status: DC

## 2015-09-06 MED ORDER — HYDROCODONE-ACETAMINOPHEN 5-325 MG PO TABS: 1.0000 | ORAL_TABLET | ORAL | Status: DC | PRN

## 2015-09-06 MED ORDER — PROPOFOL 10 MG/ML IV BOLUS
INTRAVENOUS | Status: DC | PRN
  Administered 2015-09-06: 70 mg via INTRAVENOUS

## 2015-09-06 MED ORDER — CLINDAMYCIN PHOSPHATE 600 MG/50ML IV SOLN: 600.0000 mg | Freq: Once | INTRAVENOUS | Status: DC

## 2015-09-06 MED ORDER — FENTANYL CITRATE (PF) 100 MCG/2ML IJ SOLN
25.0000 ug | INTRAMUSCULAR | Status: AC | PRN
  Administered 2015-09-06 (×6): 25 ug via INTRAVENOUS

## 2015-09-06 MED ORDER — FENTANYL CITRATE (PF) 100 MCG/2ML IJ SOLN
INTRAMUSCULAR | Status: AC
  Administered 2015-09-06: 25 ug via INTRAVENOUS
  Filled 2015-09-06: qty 2

## 2015-09-06 MED ORDER — LACTATED RINGERS IV SOLN
INTRAVENOUS | Status: DC
  Administered 2015-09-06: 100 mL/h via INTRAVENOUS

## 2015-09-06 MED ORDER — EPHEDRINE SULFATE 50 MG/ML IJ SOLN
INTRAMUSCULAR | Status: DC | PRN
  Administered 2015-09-06: 10 mg via INTRAVENOUS

## 2015-09-06 MED ORDER — FENTANYL CITRATE (PF) 100 MCG/2ML IJ SOLN
25.0000 ug | INTRAMUSCULAR | Status: AC
  Administered 2015-09-06: 25 ug via INTRAVENOUS

## 2015-09-06 MED ORDER — METOCLOPRAMIDE HCL 5 MG/ML IJ SOLN: 5.0000 mg | Freq: Three times a day (TID) | INTRAMUSCULAR | Status: DC | PRN

## 2015-09-06 MED ORDER — ONDANSETRON HCL 4 MG/2ML IJ SOLN
INTRAMUSCULAR | Status: DC | PRN
  Administered 2015-09-06: 4 mg via INTRAVENOUS

## 2015-09-06 SURGICAL SUPPLY — 37 items
BANDAGE ACE 4X5 VEL STRL LF (GAUZE/BANDAGES/DRESSINGS) ×3 IMPLANT
BIT DRILL 2 FAST STEP (BIT) ×3 IMPLANT
BIT DRILL 2.5X4 QC (BIT) ×3 IMPLANT
CANISTER SUCT 1200ML W/VALVE (MISCELLANEOUS) ×3 IMPLANT
CHLORAPREP W/TINT 26ML (MISCELLANEOUS) ×3 IMPLANT
DRAPE FLUOR MINI C-ARM 54X84 (DRAPES) ×3 IMPLANT
DRILL BIT 2.5 IMPLANT
ELECT REM PT RETURN 9FT ADLT (ELECTROSURGICAL) ×3
ELECTRODE REM PT RTRN 9FT ADLT (ELECTROSURGICAL) ×1 IMPLANT
GAUZE PETRO XEROFOAM 1X8 (MISCELLANEOUS) ×6 IMPLANT
GAUZE SPONGE 4X4 12PLY STRL (GAUZE/BANDAGES/DRESSINGS) ×3 IMPLANT
GLOVE BIOGEL PI IND STRL 9 (GLOVE) ×1 IMPLANT
GLOVE BIOGEL PI INDICATOR 9 (GLOVE) ×2
GLOVE SURG ORTHO 9.0 STRL STRW (GLOVE) ×3 IMPLANT
GOWN SPECIALTY ULTRA XL (MISCELLANEOUS) ×3 IMPLANT
GOWN STRL REUS W/ TWL LRG LVL3 (GOWN DISPOSABLE) ×1 IMPLANT
GOWN STRL REUS W/TWL LRG LVL3 (GOWN DISPOSABLE) ×2
KIT RM TURNOVER STRD PROC AR (KITS) ×3 IMPLANT
NEEDLE FILTER BLUNT 18X 1/2SAF (NEEDLE) ×2
NEEDLE FILTER BLUNT 18X1 1/2 (NEEDLE) ×1 IMPLANT
NS IRRIG 500ML POUR BTL (IV SOLUTION) ×3 IMPLANT
PACK EXTREMITY ARMC (MISCELLANEOUS) ×3 IMPLANT
PAD CAST CTTN 4X4 STRL (SOFTGOODS) ×2 IMPLANT
PADDING CAST COTTON 4X4 STRL (SOFTGOODS) ×4
PEG SUBCHONDRAL SMOOTH 2.0X16 (Peg) ×3 IMPLANT
PEG SUBCHONDRAL SMOOTH 2.0X20 (Peg) ×9 IMPLANT
PEG SUBCHONDRAL SMOOTH 2.0X22 (Peg) ×3 IMPLANT
PEG SUBCHONDRAL SMOOTH 2.0X24 (Peg) ×3 IMPLANT
PLATE SHORT 21.6X48.9 NRRW LT (Plate) ×3 IMPLANT
SCREW CORT 3.5X10 LNG (Screw) ×9 IMPLANT
SPLINT CAST 1 STEP 3X12 (MISCELLANEOUS) ×3 IMPLANT
STOCKINETTE STRL 4IN 9604848 (GAUZE/BANDAGES/DRESSINGS) ×3 IMPLANT
SUT ETHILON 4-0 (SUTURE) ×2
SUT ETHILON 4-0 FS2 18XMFL BLK (SUTURE) ×1
SUT VICRYL 3-0 27IN (SUTURE) ×3 IMPLANT
SUTURE ETHLN 4-0 FS2 18XMF BLK (SUTURE) ×1 IMPLANT
SYR 3ML LL SCALE MARK (SYRINGE) ×3 IMPLANT

## 2015-09-06 NOTE — Anesthesia Procedure Notes (Signed)
Procedure Name: LMA Insertion Date/Time: 09/06/2015 11:05 AM Performed by: Nelda Marseille Pre-anesthesia Checklist: Patient identified, Patient being monitored, Timeout performed, Emergency Drugs available and Suction available Patient Re-evaluated:Patient Re-evaluated prior to inductionOxygen Delivery Method: Circle system utilized Preoxygenation: Pre-oxygenation with 100% oxygen Intubation Type: IV induction Ventilation: Mask ventilation without difficulty LMA: LMA inserted LMA Size: 5.0 Tube type: Oral Number of attempts: 1 Placement Confirmation: positive ETCO2 and breath sounds checked- equal and bilateral Tube secured with: Tape Dental Injury: Teeth and Oropharynx as per pre-operative assessment

## 2015-09-06 NOTE — Op Note (Signed)
09/06/2015  12:13 PM  PATIENT:  Kayla Galloway  82 y.o. female  PRE-OPERATIVE DIAGNOSIS:  FRACTURE LEFT RADIUS, comminuted intra-articular  POST-OPERATIVE DIAGNOSIS:  FRACTURE LEFT RADIUS  PROCEDURE:  Procedure(s): OPEN REDUCTION INTERNAL FIXATION (ORIF) DISTAL RADIAL FRACTURE (Left)  SURGEON: Laurene Footman, MD  ASSISTANTS: None  ANESTHESIA:   general  EBL:  Total I/O In: 700 [I.V.:700] Out: -   BLOOD ADMINISTERED:none  DRAINS: none   LOCAL MEDICATIONS USED:  NONE  SPECIMEN:  No Specimen  DISPOSITION OF SPECIMEN:  N/A  COUNTS:  YES  TOURNIQUET:   20 minutes at 250 mmHg  IMPLANTS: Short narrow DVR plate screws and smooth pegs  DICTATION: .Dragon Dictation patient brought the operating room and after adequate anesthesia was obtained left arm was prepped and draped in sterile fashion tourniquet by the upper arm. After patient identification timeout procedures were completed, tourniquet was raised. A volar approach is made centered over the FCR tendon. The FCR tendon sheath was opened and the tendon retracted radially deep fascia incised and the pronator elevated off the distal radius. The fracture was quite comminuted with intra-articular extension. Fingertrap traction was applied with 10 pounds and this helped restore alignment and length. The short narrow DVR plate was applied to the volar shaft in 3 cortical screws placed was the appropriate position. With the wrist held in flexion and with traction applied the distal pegs were placed care being taken to avoid intra-articular penetration of these pegs. With fluoroscopic views there is no penetration of the joint and near anatomic reduction with restoration of length and radial inclination and most of the volar tilt. The wound was then irrigated and tourniquet let down. Wound was then closed with 3-0 Vicryl subcutaneously and 4-0 nylon for the skin. Xeroform 4 x 4's web roll and a volar splint applied followed by an Ace  wrap.  PLAN OF CARE: Discharge to home after PACU  PATIENT DISPOSITION:  PACU - hemodynamically stable.

## 2015-09-06 NOTE — Transfer of Care (Signed)
Immediate Anesthesia Transfer of Care Note  Patient: Kayla Galloway  Procedure(s) Performed: Procedure(s): OPEN REDUCTION INTERNAL FIXATION (ORIF) DISTAL RADIAL FRACTURE (Left)  Patient Location: PACU  Anesthesia Type:General  Level of Consciousness: sedated  Airway & Oxygen Therapy: Patient Spontanous Breathing and Patient connected to face mask oxygen  Post-op Assessment: Report given to RN and Post -op Vital signs reviewed and stable  Post vital signs: Reviewed and stable  Last Vitals:  Filed Vitals:   09/06/15 1002  BP: 136/87  Pulse: 71  Temp: 35.7 C  Resp: 16    Complications: No apparent anesthesia complications

## 2015-09-06 NOTE — Discharge Instructions (Addendum)
AMBULATORY SURGERY  DISCHARGE INSTRUCTIONS   1) The drugs that you were given will stay in your system until tomorrow so for the next 24 hours you should not:  A) Drive an automobile B) Make any legal decisions C) Drink any alcoholic beverage   2) You may resume regular meals tomorrow.  Today it is better to start with liquids and gradually work up to solid foods.  You may eat anything you prefer, but it is better to start with liquids, then soup and crackers, and gradually work up to solid foods.   3) Please notify your doctor immediately if you have any unusual bleeding, trouble breathing, redness and pain at the surgery site, drainage, fever, or pain not relieved by medication.    Additional Instructions:           A. Keep dressing clean and dry, do NOT remove           B. Elevate left hand on 2 pillows     C. Ice pack to left wrist as needed to decrease swelling and pain.      Please contact your physician with any problems or Same Day Surgery at [phone], Monday through Friday 6 am to 4 pm, or [place] at [facility] number at [phone].

## 2015-09-06 NOTE — OR Nursing (Signed)
Spoke with sister regarding left leg redness, pt has had this redness on her left leg, PCP is aware of the redness, pt has been referred to a dermatologist and Dr. Verita Lamb.  There was not a prescription for a pain medicine in pt's chart.  Called Dr. Rudene Christians office, he is gone for the day.  Spoke with Rachelle Hora PA, pt should do fine with Arthritis strength Tylenol at home for pain.  If pt needs something stronger, call the office back.

## 2015-09-06 NOTE — H&P (Signed)
Reviewed paper H+P, will be scanned into chart. No changes noted.  

## 2015-09-06 NOTE — OR Nursing (Signed)
Left leg noted to be red and warm to touch.  Patient said it was painful to touch.  States she fell about 11 months ago and this has been a slow progress of healing. Denies history of cellulitis or phlebitis on this extremity.

## 2015-09-06 NOTE — Anesthesia Preprocedure Evaluation (Signed)
Anesthesia Evaluation  Patient identified by MRN, date of birth, ID band Patient awake    Reviewed: Allergy & Precautions, NPO status , Patient's Chart, lab work & pertinent test results  Airway Mallampati: II  TM Distance: >3 FB Neck ROM: Full    Dental  (+) Partial Lower, Chipped   Pulmonary neg pulmonary ROS,    Pulmonary exam normal breath sounds clear to auscultation       Cardiovascular hypertension, Pt. on medications + Peripheral Vascular Disease  Normal cardiovascular exam     Neuro/Psych Depression negative neurological ROS     GI/Hepatic Neg liver ROS, GERD  Medicated and Controlled,  Endo/Other  negative endocrine ROS  Renal/GU Renal InsufficiencyRenal diseaseHx of renal vein thrombosis, hx of stones  negative genitourinary   Musculoskeletal  (+) Arthritis , Osteoarthritis,  Fx of wrist   Abdominal Normal abdominal exam  (+)   Peds negative pediatric ROS (+)  Hematology  (+) anemia , IVC filter   Anesthesia Other Findings   Reproductive/Obstetrics                             Anesthesia Physical Anesthesia Plan  ASA: III  Anesthesia Plan: General   Post-op Pain Management:    Induction: Intravenous  Airway Management Planned: LMA  Additional Equipment:   Intra-op Plan:   Post-operative Plan: Extubation in OR  Informed Consent: I have reviewed the patients History and Physical, chart, labs and discussed the procedure including the risks, benefits and alternatives for the proposed anesthesia with the patient or authorized representative who has indicated his/her understanding and acceptance.   Dental advisory given  Plan Discussed with: CRNA and Surgeon  Anesthesia Plan Comments:         Anesthesia Quick Evaluation

## 2015-09-07 NOTE — Anesthesia Postprocedure Evaluation (Signed)
Anesthesia Post Note  Patient: NATALIEE ROLSTAD  Procedure(s) Performed: Procedure(s) (LRB): OPEN REDUCTION INTERNAL FIXATION (ORIF) DISTAL RADIAL FRACTURE (Left)  Patient location during evaluation: PACU Anesthesia Type: General Level of consciousness: awake and alert and oriented Pain management: pain level controlled Vital Signs Assessment: post-procedure vital signs reviewed and stable Respiratory status: spontaneous breathing Cardiovascular status: blood pressure returned to baseline Anesthetic complications: no    Last Vitals:  Filed Vitals:   09/06/15 1515 09/06/15 1530  BP:    Pulse: 88 70  Temp:    Resp:      Last Pain:  Filed Vitals:   09/07/15 0903  PainSc: 7                  Shadonna Benedick

## 2015-09-11 DIAGNOSIS — Z8781 Personal history of (healed) traumatic fracture: Secondary | ICD-10-CM | POA: Diagnosis not present

## 2015-09-11 DIAGNOSIS — S52532A Colles' fracture of left radius, initial encounter for closed fracture: Secondary | ICD-10-CM | POA: Diagnosis not present

## 2015-09-24 DIAGNOSIS — Z8781 Personal history of (healed) traumatic fracture: Secondary | ICD-10-CM | POA: Diagnosis not present

## 2015-09-24 DIAGNOSIS — Z967 Presence of other bone and tendon implants: Secondary | ICD-10-CM | POA: Diagnosis not present

## 2015-10-24 DIAGNOSIS — Z967 Presence of other bone and tendon implants: Secondary | ICD-10-CM | POA: Diagnosis not present

## 2015-10-24 DIAGNOSIS — Z8781 Personal history of (healed) traumatic fracture: Secondary | ICD-10-CM | POA: Diagnosis not present

## 2015-11-09 ENCOUNTER — Other Ambulatory Visit: Payer: Self-pay | Admitting: Internal Medicine

## 2015-11-09 MED ORDER — HYDROXYZINE HCL 10 MG PO TABS: 25.0000 mg | ORAL_TABLET | Freq: Three times a day (TID) | ORAL | Status: DC | PRN

## 2015-11-09 NOTE — Telephone Encounter (Signed)
The patient is needing this refilled today   hydrOXYzine (ATARAX/VISTARIL) 10 MG tablet

## 2015-11-09 NOTE — Telephone Encounter (Signed)
If she has been taking the medication previously and had no problems taking, then ok to refill x 1.  If continues to have itching, etc - needs to be seen.

## 2015-11-09 NOTE — Telephone Encounter (Signed)
Spoke with the patient's sister. Patient is c/o itching all over, patients sister spoke with Dr. Ouida Sills and she wanted them to call PCP to have this prescribed.  Please advise.

## 2015-11-09 NOTE — Telephone Encounter (Signed)
Last OV was 03/23/15, Please advise refill, thanks

## 2015-11-09 NOTE — Telephone Encounter (Signed)
We last refilled this in June.  What does she need this medication for?

## 2015-11-30 ENCOUNTER — Other Ambulatory Visit: Payer: Self-pay | Admitting: Internal Medicine

## 2015-12-29 ENCOUNTER — Other Ambulatory Visit: Payer: Self-pay | Admitting: Internal Medicine

## 2015-12-31 NOTE — Telephone Encounter (Signed)
Last OV 8/16 ok to refill Kenalog cream?

## 2015-12-31 NOTE — Telephone Encounter (Signed)
Not usually a cream I continue to refill.  Need to know what she needs this for and she needs a f/u appt scheduled.

## 2016-03-05 DIAGNOSIS — I872 Venous insufficiency (chronic) (peripheral): Secondary | ICD-10-CM | POA: Diagnosis not present

## 2016-03-05 DIAGNOSIS — L97921 Non-pressure chronic ulcer of unspecified part of left lower leg limited to breakdown of skin: Secondary | ICD-10-CM | POA: Diagnosis not present

## 2016-03-06 ENCOUNTER — Other Ambulatory Visit: Payer: Self-pay | Admitting: Internal Medicine

## 2016-03-24 DIAGNOSIS — I83009 Varicose veins of unspecified lower extremity with ulcer of unspecified site: Secondary | ICD-10-CM | POA: Diagnosis not present

## 2016-03-24 DIAGNOSIS — I872 Venous insufficiency (chronic) (peripheral): Secondary | ICD-10-CM | POA: Diagnosis not present

## 2016-03-24 DIAGNOSIS — L97909 Non-pressure chronic ulcer of unspecified part of unspecified lower leg with unspecified severity: Secondary | ICD-10-CM | POA: Diagnosis not present

## 2016-04-23 ENCOUNTER — Other Ambulatory Visit: Payer: Self-pay | Admitting: Internal Medicine

## 2016-05-26 IMAGING — DX DG CHEST 2V
2 series · 2 of 2 positions shown · non-contrast
Comparison: Chest radiograph performed 08/05/2012

CLINICAL DATA: Acute onset of chest pain just below the sternum.
Status post fall. Initial encounter.

EXAM:
CHEST  2 VIEW

[chest pa]
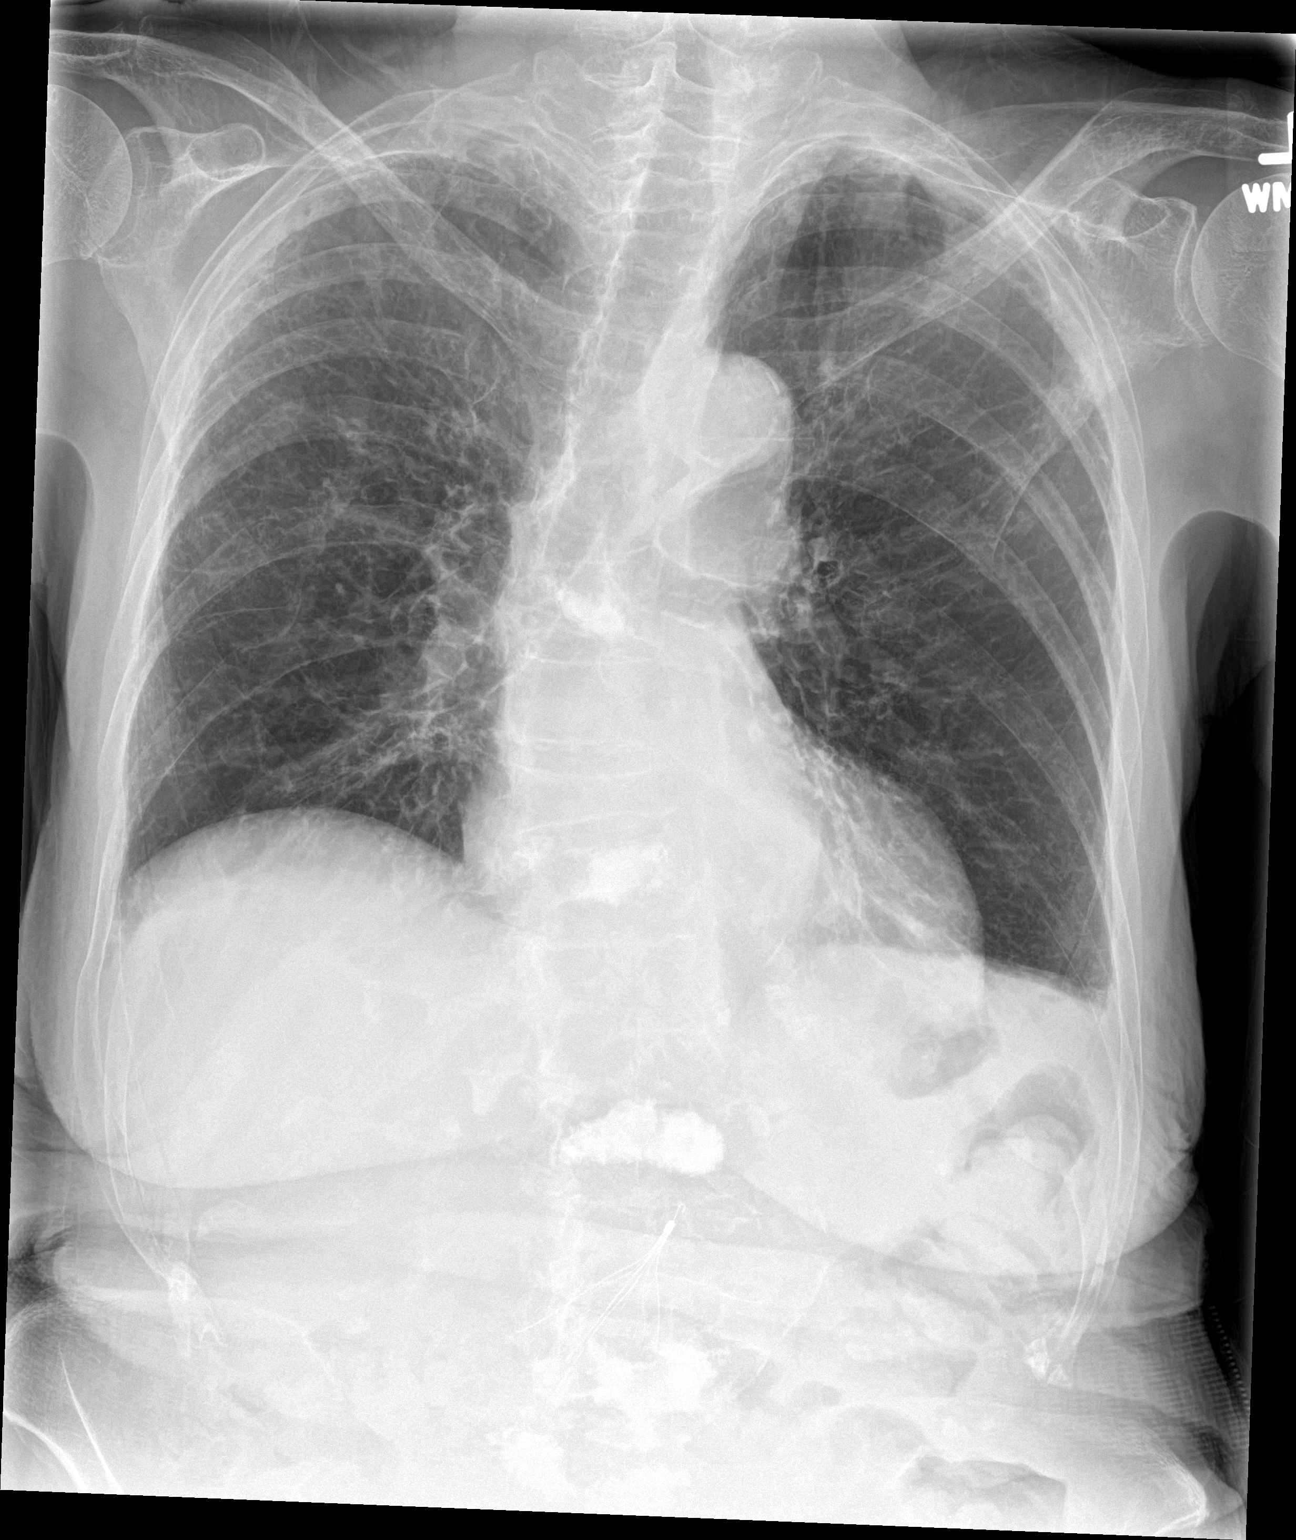

[chest lat]
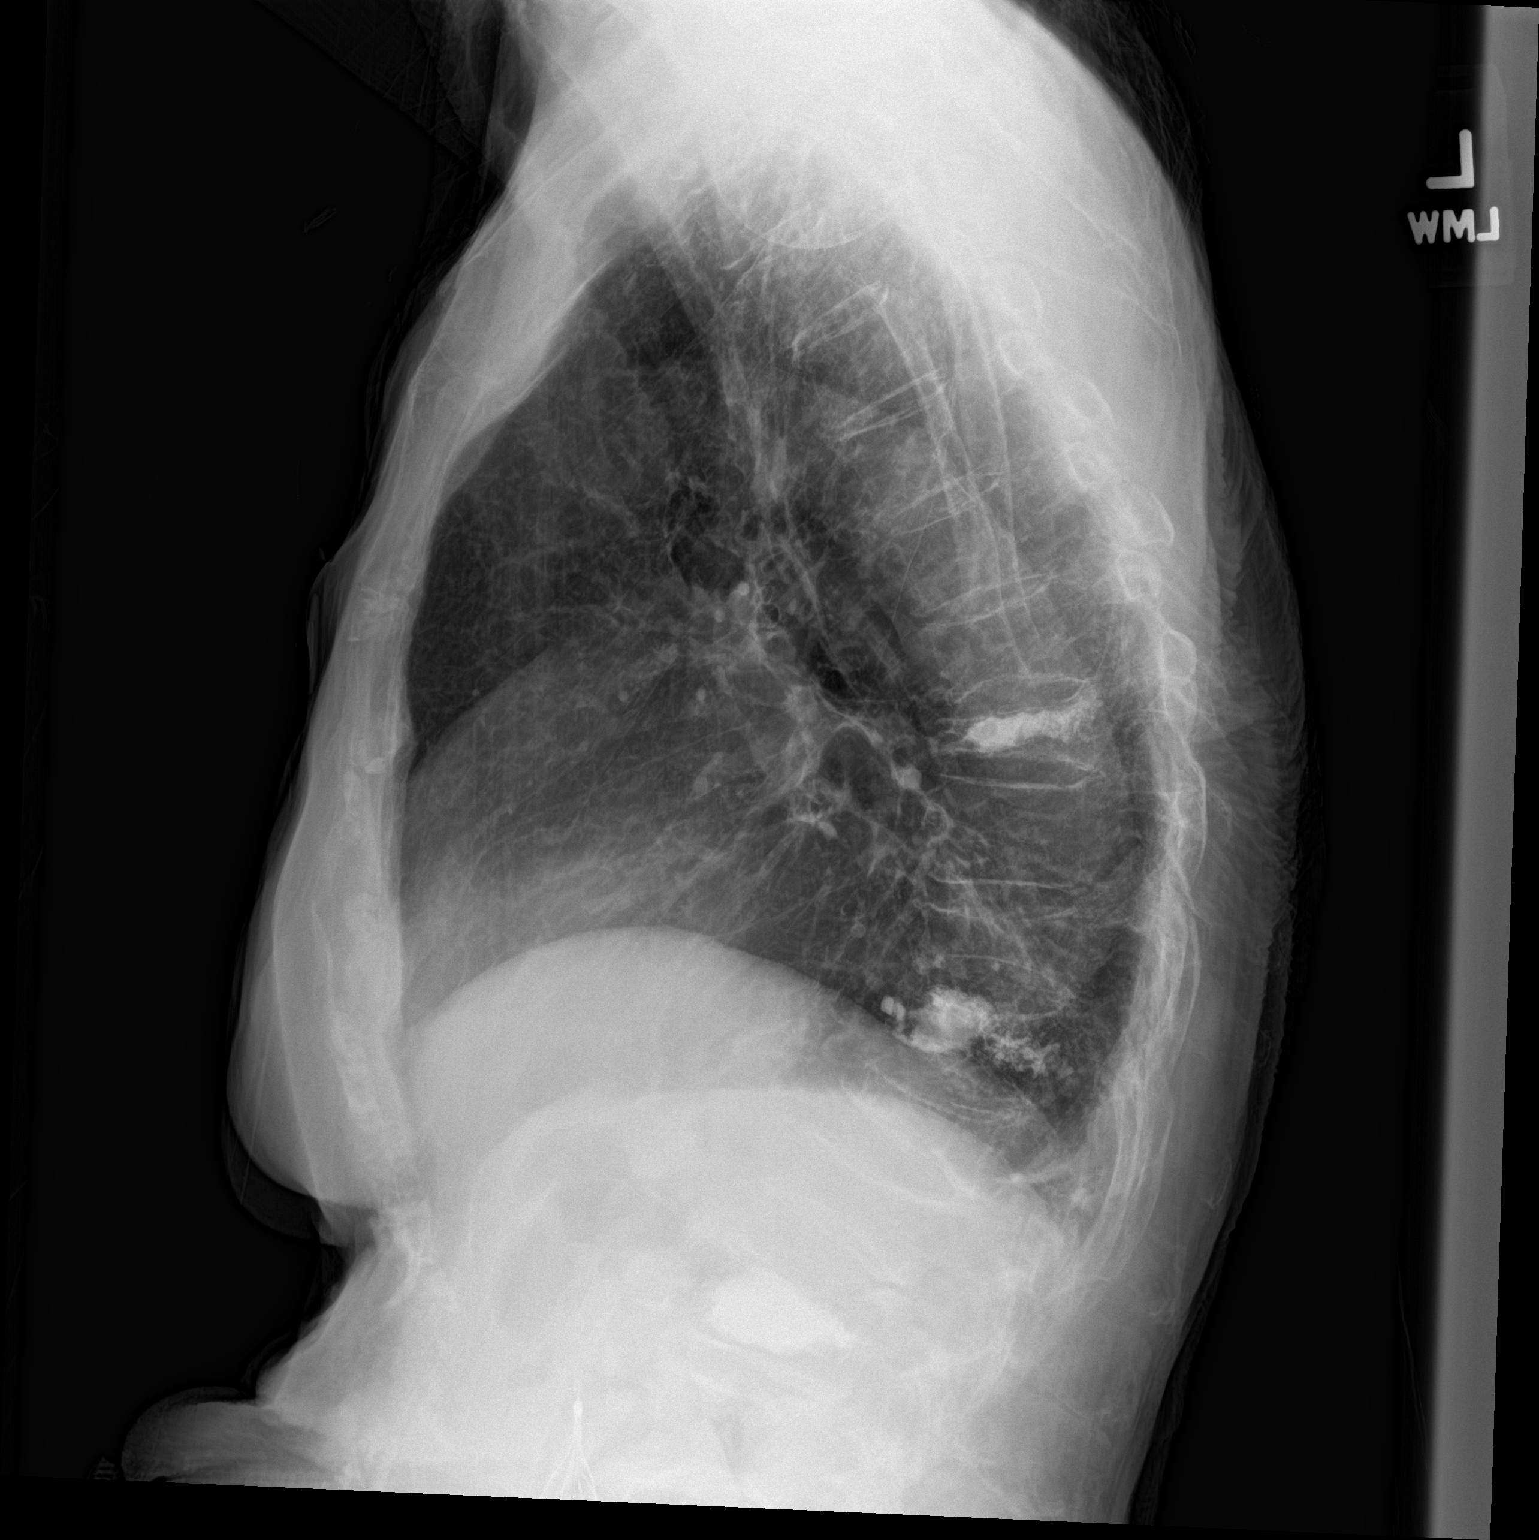

[2 of 2 positions shown; findings below may reference images not displayed]

FINDINGS: The lungs are well-aerated. Peribronchial thickening is noted. Mild
peripheral scarring is noted near the lung apices. There is no
evidence of pleural effusion or pneumothorax.

The heart is normal in size; the mediastinal contour is within
normal limits. No acute osseous abnormalities are seen. The patient
is status post vertebroplasty at the mid and lower thoracic spine.
Multiple chronic compression deformities are noted along the mid and
lower thoracic spine. An IVC filter is noted in grossly unchanged
location.
IMPRESSION: 1. Chronic peribronchial thickening noted. Mild peripheral scarring
at the lung apices. Lungs otherwise grossly clear. No displaced rib
fracture seen.
2. Multiple chronic compression deformities along the mid and lower
thoracic spine, with underlying changes of vertebroplasty at several
levels.

## 2016-06-11 ENCOUNTER — Ambulatory Visit
Admission: RE | Admit: 2016-06-11 | Discharge: 2016-06-11 | Disposition: A | Discharge status: Home / Self Care | Payer: Medicare Other | Source: Ambulatory Visit | Attending: Internal Medicine | Admitting: Internal Medicine

## 2016-06-11 ENCOUNTER — Other Ambulatory Visit: Payer: Self-pay | Admitting: Internal Medicine

## 2016-06-11 ENCOUNTER — Encounter: Payer: Self-pay | Admitting: Internal Medicine

## 2016-06-11 ENCOUNTER — Ambulatory Visit (INDEPENDENT_AMBULATORY_CARE_PROVIDER_SITE_OTHER): Payer: Medicare Other | Admitting: Internal Medicine

## 2016-06-11 VITALS — BP 140/74 | HR 82 | Temp 97.5°F | Ht 61.0 in | Wt 129.6 lb

## 2016-06-11 DIAGNOSIS — R600 Localized edema: Secondary | ICD-10-CM | POA: Diagnosis not present

## 2016-06-11 DIAGNOSIS — M7989 Other specified soft tissue disorders: Secondary | ICD-10-CM

## 2016-06-11 DIAGNOSIS — I872 Venous insufficiency (chronic) (peripheral): Secondary | ICD-10-CM | POA: Diagnosis not present

## 2016-06-11 DIAGNOSIS — L03119 Cellulitis of unspecified part of limb: Secondary | ICD-10-CM | POA: Diagnosis not present

## 2016-06-11 DIAGNOSIS — N289 Disorder of kidney and ureter, unspecified: Secondary | ICD-10-CM

## 2016-06-11 DIAGNOSIS — I10 Essential (primary) hypertension: Secondary | ICD-10-CM

## 2016-06-11 DIAGNOSIS — R634 Abnormal weight loss: Secondary | ICD-10-CM

## 2016-06-11 DIAGNOSIS — D649 Anemia, unspecified: Secondary | ICD-10-CM

## 2016-06-11 DIAGNOSIS — E7800 Pure hypercholesterolemia, unspecified: Secondary | ICD-10-CM

## 2016-06-11 DIAGNOSIS — L97209 Non-pressure chronic ulcer of unspecified calf with unspecified severity: Secondary | ICD-10-CM

## 2016-06-11 LAB — COMPREHENSIVE METABOLIC PANEL
ALT: 10 U/L (ref 0–35)
AST: 19 U/L (ref 0–37)
Albumin: 4.2 g/dL (ref 3.5–5.2)
Alkaline Phosphatase: 49 U/L (ref 39–117)
BILIRUBIN TOTAL: 0.5 mg/dL (ref 0.2–1.2)
BUN: 27 mg/dL — ABNORMAL HIGH (ref 6–23)
CO2: 25 meq/L (ref 19–32)
CREATININE: 1.4 mg/dL — AB (ref 0.40–1.20)
Calcium: 9.3 mg/dL (ref 8.4–10.5)
Chloride: 102 mEq/L (ref 96–112)
GFR: 37.3 mL/min — ABNORMAL LOW (ref >60.00)
GLUCOSE: 93 mg/dL (ref 70–99)
Potassium: 4.1 mEq/L (ref 3.5–5.1)
SODIUM: 136 meq/L (ref 135–145)
Total Protein: 6.7 g/dL (ref 6.0–8.3)

## 2016-06-11 LAB — CBC WITH DIFFERENTIAL/PLATELET
BASOS ABS: 0 10*3/uL (ref 0.0–0.1)
BASOS PCT: 0.2 % (ref 0.0–3.0)
EOS ABS: 0.2 10*3/uL (ref 0.0–0.7)
Eosinophils Relative: 3 % (ref 0.0–5.0)
HCT: 32.7 % — ABNORMAL LOW (ref 36.0–46.0)
Hemoglobin: 10.8 g/dL — ABNORMAL LOW (ref 12.0–15.0)
LYMPHS ABS: 1.5 10*3/uL (ref 0.7–4.0)
Lymphocytes Relative: 22 % (ref 12.0–46.0)
MCHC: 33.2 g/dL (ref 30.0–36.0)
MCV: 95.6 fl (ref 78.0–100.0)
MONOS PCT: 11.1 % (ref 3.0–12.0)
Monocytes Absolute: 0.8 10*3/uL (ref 0.1–1.0)
NEUTROS ABS: 4.4 10*3/uL (ref 1.4–7.7)
NEUTROS PCT: 63.7 % (ref 43.0–77.0)
PLATELETS: 158 10*3/uL (ref 150.0–400.0)
RBC: 3.41 Mil/uL — ABNORMAL LOW (ref 3.87–5.11)
RDW: 14.9 % (ref 11.5–15.5)
WBC: 6.9 10*3/uL (ref 4.0–10.5)

## 2016-06-11 LAB — TSH: TSH: 1.61 u[IU]/mL (ref 0.35–4.50)

## 2016-06-11 MED ORDER — MUPIROCIN 2 % EX OINT: TOPICAL_OINTMENT | CUTANEOUS | 0 refills | Status: DC

## 2016-06-11 MED ORDER — TRIAMCINOLONE ACETONIDE 0.5 % EX CREA: 1.0000 "application " | TOPICAL_CREAM | Freq: Two times a day (BID) | CUTANEOUS | 1 refills | Status: DC

## 2016-06-11 NOTE — Progress Notes (Signed)
Patient ID: Kayla Galloway, female   DOB: 05-Dec-1920, 82 y.o.   MRN: [id number]   Subjective:    Patient ID: Kayla Galloway, female    DOB: 08/27/1920, 82 y.o.   MRN: [id number]  HPI  Patient here for a scheduled follow up.  She was last seen by me 03/2015.  She fell 08/2015.  Wrist fracture.  Is s/p left ORIF - radial fracture.  Saw ortho.  She fell again 02/2016.  No injury then.  She states she is getting around relatively well.  Her sister accompanies her today.  History obtained from both of them.  No chest pain.  Breathing stable.  She has noticed some swelling in her legs again.  Some redness.  Right leg is worse.  Sister gave her an extra lasix yesterday.     Past Medical History:  Diagnosis Date  . Anemia   . Arthritis   . Cancer ([place])    skin ca lesion of scalp- removed  . Depression   . DVT (deep venous thrombosis), left    bilateral, IVC filter 2010  . GERD (gastroesophageal reflux disease)   . Gout   . Hypercholesterolemia   . Hyperkalemia   . Hypertension    Dr. [name]  . Nephrolithiasis   . Obesity   . Osteoporosis   . Peripheral vascular disease ([place])   . 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 Spider veins    Past Surgical History:  Procedure Laterality Date  . CARDIAC CATHETERIZATION     2010 Does not see a cardiac  doctor  . Colonsopy    . DENTAL SURGERY    . ECTOPIC PREGNANCY SURGERY    . EYE SURGERY Bilateral 2011,2012   cataracts  . FRACTURE SURGERY  2009   hip, rod from knee to hip  . HEMORRHOID SURGERY    . IVC filter Left    pt states it has turned side ways and could not be removed.  Marland Kitchen KYPHOPLASTY  09/03/2012   per patient, she has had kypho x 2:  Surgeon: Winfield Cunas, MD;  Location: [place] [facility];  Service: Neurosurgery;  Laterality: N/A;  Thoracic eight Kyphoplasty  . OPEN REDUCTION INTERNAL FIXATION (ORIF) DISTAL RADIAL FRACTURE  Left 09/06/2015   Procedure: OPEN REDUCTION INTERNAL FIXATION (ORIF) DISTAL RADIAL FRACTURE;  Surgeon: Hessie Knows, MD;  Location: [facility] ORS;  Service: Orthopedics;  Laterality: Left;  . RIGHT OOPHORECTOMY     partial-  . SKIN CANCER EXCISION     top of head  . TONSILLECTOMY     age 57  . VERTEBROPLASTY  12/31/2011   Procedure: VERTEBROPLASTY;  Surgeon: Winfield Cunas, MD;  Location: [place] [facility] OR[facility];  Service: Neurosurgery;  Laterality: N/A;  Thoracic eleven vertebroplasty   Family History  Problem Relation Age of Onset  . Heart disease Father     myocardial infarction  . Heart disease Brother     myocardial infarction  . Lymphoma Sister   . Anesthesia problems Neg Hx   . Breast cancer Neg Hx   . Colon cancer Neg Hx    Social History   Social History  . Marital status: Widowed    Spouse name: N/A  . Number of children: 0  . Years of education: N/A   Social History Main Topics  . Smoking status: Never Smoker  . Smokeless tobacco: Never  Used  . Alcohol use No  . Drug use: No  . Sexual activity: Not Asked   Other Topics Concern  . None   Social History Narrative  . None    Outpatient Encounter Prescriptions as of 06/11/2016  Medication Sig  . acetaminophen (TYLENOL) 650 MG CR tablet Take 650 mg by mouth every 8 (eight) hours as needed for pain.   Marland Kitchen apixaban (ELIQUIS) 5 MG TABS tablet Take 1 tablet (5 mg total) by mouth 2 (two) times daily.  . bifidobacterium infantis (ALIGN) capsule Take 1 capsule by mouth daily.  . Calcium Carb-Cholecalciferol (CALCIUM 600 + D PO) Take 1 tablet by mouth 3 (three) times daily.  . clindamycin (CLEOCIN) 300 MG capsule Take 1 capsule (300 mg total) by mouth 3 (three) times daily.  . furosemide (LASIX) 20 MG tablet Take 1 tablet (20 mg total) by mouth daily.  . hydrocerin (EUCERIN) CREA Apply 1 application topically 2 (two) times daily.  . hydrOXYzine (ATARAX/VISTARIL) 10 MG tablet TAKE 2 & 1/2 TABLETS BY MOUTH 3 TIMES DAILY AS NEEDED FOR  ITCHING  . Misc Natural Products (OSTEO BI-FLEX JOINT SHIELD PO) Take 1 tablet by mouth 2 (two) times daily.  Marland Kitchen omeprazole (PRILOSEC) 20 MG capsule Take 1 capsule by mouth  daily  . ramipril (ALTACE) 5 MG capsule Take 1 capsule by mouth  daily  . sertraline (ZOLOFT) 50 MG tablet Take 1 tablet by mouth  daily  . triamcinolone cream (KENALOG) 0.5 % Apply 1 application topically 2 (two) times daily.  . [DISCONTINUED] triamcinolone cream (KENALOG) 0.5 % Apply 1 application topically 2 (two) times daily.  . mupirocin ointment (BACTROBAN) 2 % Apply to affected area bid  . [DISCONTINUED] HYDROcodone-acetaminophen (NORCO/VICODIN) 5-325 MG per tablet Take 1 tablet by mouth daily. Reported on 09/06/2015   No facility-administered encounter medications on file as of 06/11/2016.     Review of Systems  Constitutional: Negative for appetite change and unexpected weight change.  HENT: Negative for congestion and sinus pressure.   Respiratory: Negative for chest tightness and shortness of breath.   Cardiovascular: Positive for leg swelling. Negative for chest pain and palpitations.  Gastrointestinal: Negative for diarrhea, nausea and vomiting.  Genitourinary: Negative for difficulty urinating and dysuria.  Musculoskeletal: Negative for joint swelling and myalgias.  Skin:       Increased erythema - right lower extremity.   Neurological: Negative for dizziness, light-headedness and headaches.  Psychiatric/Behavioral: Negative for agitation and dysphoric mood.       Objective:    Physical Exam  Constitutional: She appears well-developed and well-nourished. No distress.  HENT:  Nose: Nose normal.  Mouth/Throat: Oropharynx is clear and moist.  Neck: Neck supple. No thyromegaly present.  Cardiovascular: Normal rate and regular rhythm.   Pulmonary/Chest: Breath sounds normal. No respiratory distress. She has no wheezes.  Abdominal: Soft. Bowel sounds are normal. There is no tenderness.    Musculoskeletal: She exhibits no tenderness.  Increased edema - right lower extremity > left.  Some increased tenderness to palpation.    Lymphadenopathy:    She has no cervical adenopathy.  Skin: Rash noted. There is erythema.  Psychiatric: She has a normal mood and affect. Her behavior is normal.    BP 140/74   Pulse 82   Temp 97.5 F (36.4 C) (Oral)   Ht 5\' 1"  (1.549 m)   Wt 129 lb 9.6 oz (58.8 kg)   SpO2 98%   BMI 24.49 kg/m  Wt Readings from Last 3 Encounters:  06/11/16 129 lb 9.6 oz (58.8 kg)  09/06/15 120 lb (54.4 kg)  09/02/15 132 lb 14.4 oz (60.3 kg)     Lab Results  Component Value Date   WBC 6.9 06/11/2016   HGB 10.8 (L) 06/11/2016   HCT 32.7 (L) 06/11/2016   PLT 158.0 06/11/2016   GLUCOSE 93 06/11/2016   CHOL 229 (H) 07/12/2012   TRIG 87.0 07/12/2012   HDL 86.30 07/12/2012   LDLDIRECT 135.9 07/12/2012   ALT 10 06/11/2016   AST 19 06/11/2016   NA 136 06/11/2016   K 4.1 06/11/2016   CL 102 06/11/2016   CREATININE 1.40 (H) 06/11/2016   BUN 27 (H) 06/11/2016   CO2 25 06/11/2016   TSH 1.61 06/11/2016   INR 1.00 01/24/2015    Dg Wrist 2 Views Left  Result Date: 09/06/2015 CLINICAL DATA:  Status post ORIF left wrist EXAM: LEFT WRIST - 2 VIEW COMPARISON:  None. FINDINGS: There is a comminuted left distal radial metaphysis fracture transfixed with a metallic side plate and multiple interlocking screws. The alignment is near anatomic. There is no other fracture or dislocation. IMPRESSION: Comminuted left distal radial metaphysis fracture transfixed with a metallic side plate and multiple interlocking screws without failure or complication. Electronically Signed   By: Kathreen Devoid   On: 09/06/2015 14:49       Assessment & Plan:   Problem List Items Addressed This Visit    Anemia    Has known anemia.  Declines any futher w/up.  Recheck cbc today.       Cellulitis - Primary    Some swelling as outlined.  bactroban as directed.  Get her back in soon to  reassess.       Relevant Orders   CBC with Differential/Platelet (Completed)   Hypercholesterolemia    Low cholesterol diet and exercise.  Follow lipid panel.       Relevant Orders   Comprehensive metabolic panel (Completed)   TSH (Completed)   Hypertension    Blood pressure under good control.  Continue same medication regimen.  Follow pressures.  Follow metabolic panel.        Renal insufficiency    Stay hydrated.  Recheck metabolic panel.        Stasis ulcer ([facility])   Swelling of right lower extremity    Increased swelling and redness.  Elevate legs. On lasix.  Check venous duplex to confirm no DVT.  Has a history of DVT.  bactroban topically.  Follow closely.  Get her back in next week for reevaluation.        Relevant Orders   US Venous Img Lower Unilateral Right (Completed)   Weight loss    Weight stable from January check.  Follow.         Other Visit Diagnoses   None.      [name], MD

## 2016-06-11 NOTE — Progress Notes (Signed)
Pre visit review using our clinic review tool, if applicable. No additional management support is needed unless otherwise documented below in the visit note. 

## 2016-06-12 ENCOUNTER — Other Ambulatory Visit: Payer: Self-pay | Admitting: Internal Medicine

## 2016-06-12 ENCOUNTER — Telehealth: Payer: Self-pay | Admitting: Internal Medicine

## 2016-06-12 DIAGNOSIS — R7989 Other specified abnormal findings of blood chemistry: Secondary | ICD-10-CM

## 2016-06-12 NOTE — Telephone Encounter (Signed)
Last filled 03/06/16 30

## 2016-06-12 NOTE — Progress Notes (Signed)
Orders placed for f/u labs.  

## 2016-06-12 NOTE — Telephone Encounter (Signed)
Pt sister called back returning call. Thank you!  [name] pt sister [phone]

## 2016-06-13 NOTE — Telephone Encounter (Signed)
Was not taking this on last visit.  Would prefer to hold on refilling a this time.  She is due to f/u with me next week.  Can reevaluate the need for medication at that appt.  Let me know if a problem.

## 2016-06-15 ENCOUNTER — Encounter: Payer: Self-pay | Admitting: Internal Medicine

## 2016-06-15 NOTE — Assessment & Plan Note (Signed)
Low cholesterol diet and exercise.  Follow lipid panel.   

## 2016-06-15 NOTE — Assessment & Plan Note (Signed)
Increased swelling and redness.  Elevate legs. On lasix.  Check venous duplex to confirm no DVT.  Has a history of DVT.  bactroban topically.  Follow closely.  Get her back in next week for reevaluation.

## 2016-06-15 NOTE — Assessment & Plan Note (Signed)
Weight stable from January check.  Follow.

## 2016-06-15 NOTE — Assessment & Plan Note (Signed)
Stay hydrated.  Recheck metabolic panel.   

## 2016-06-15 NOTE — Assessment & Plan Note (Signed)
Blood pressure under good control.  Continue same medication regimen.  Follow pressures.  Follow metabolic panel.   

## 2016-06-15 NOTE — Assessment & Plan Note (Signed)
Has known anemia.  Declines any futher w/up.  Recheck cbc today.

## 2016-06-15 NOTE — Assessment & Plan Note (Signed)
Some swelling as outlined.  bactroban as directed.  Get her back in soon to reassess.

## 2016-06-18 ENCOUNTER — Other Ambulatory Visit (INDEPENDENT_AMBULATORY_CARE_PROVIDER_SITE_OTHER): Payer: Medicare Other

## 2016-06-18 ENCOUNTER — Ambulatory Visit (INDEPENDENT_AMBULATORY_CARE_PROVIDER_SITE_OTHER): Payer: Medicare Other | Admitting: Internal Medicine

## 2016-06-18 ENCOUNTER — Encounter: Payer: Self-pay | Admitting: Internal Medicine

## 2016-06-18 DIAGNOSIS — R7989 Other specified abnormal findings of blood chemistry: Secondary | ICD-10-CM

## 2016-06-18 DIAGNOSIS — I10 Essential (primary) hypertension: Secondary | ICD-10-CM | POA: Diagnosis not present

## 2016-06-18 DIAGNOSIS — M7989 Other specified soft tissue disorders: Secondary | ICD-10-CM | POA: Diagnosis not present

## 2016-06-18 DIAGNOSIS — N289 Disorder of kidney and ureter, unspecified: Secondary | ICD-10-CM

## 2016-06-18 DIAGNOSIS — D649 Anemia, unspecified: Secondary | ICD-10-CM | POA: Diagnosis not present

## 2016-06-18 DIAGNOSIS — R8281 Pyuria: Secondary | ICD-10-CM

## 2016-06-18 LAB — URINALYSIS, ROUTINE W REFLEX MICROSCOPIC
BILIRUBIN URINE: NEGATIVE
HGB URINE DIPSTICK: NEGATIVE
KETONES UR: NEGATIVE
NITRITE: POSITIVE — AB
SPECIFIC GRAVITY, URINE: 1.01 (ref 1.000–1.030)
Total Protein, Urine: NEGATIVE
URINE GLUCOSE: NEGATIVE
UROBILINOGEN UA: 0.2 (ref 0.0–1.0)
pH: 7 (ref 5.0–8.0)

## 2016-06-18 LAB — BASIC METABOLIC PANEL
BUN: 21 mg/dL (ref 6–23)
CALCIUM: 9.5 mg/dL (ref 8.4–10.5)
CO2: 25 mEq/L (ref 19–32)
Chloride: 102 mEq/L (ref 96–112)
Creatinine, Ser: 1.07 mg/dL (ref 0.40–1.20)
GFR: 50.86 mL/min — AB (ref >60.00)
GLUCOSE: 93 mg/dL (ref 70–99)
Potassium: 4.4 mEq/L (ref 3.5–5.1)
SODIUM: 136 meq/L (ref 135–145)

## 2016-06-18 NOTE — Progress Notes (Signed)
Patient ID: [name], female   DOB: [birth year], 82 y.o.   MRN: [id number]   Subjective:    Patient ID: Kayla Galloway, female    DOB: Feb 19, 1921, 82 y.o.   MRN: [id number]  HPI  Patient here for a scheduled follow up.  Was seen last week with right lower extremity swelling and redness.  Lower extremity ultrasound was negative for DVT.  She was given topical bactroban.  She comes in today to follow up on her leg.  She is accompanied by her sister.  History obtained from both of them.  Sister reports that Kayla Galloway had increased swelling for the last two days.  Normally on one lasix per day, but for the last two days took two.  Swelling is better.  Right leg still larger than left.  Some increased pain with palpation.  She is eating.  No change in her breathing. No increased sob.  Feels breathing is stable.  Is anemic.  hgb relatively stable.  She has declined further w/up.  No chest pain.  No nausea or vomiting.  Bowels moving.    Past Medical History:  Diagnosis Date  . Anemia   . Arthritis   . Cancer ([place])    skin ca lesion of scalp- removed  . Depression   . DVT (deep venous thrombosis), left    bilateral, IVC filter 2010  . GERD (gastroesophageal reflux disease)   . Gout   . Hypercholesterolemia   . Hyperkalemia   . Hypertension    Dr. [name]  . Nephrolithiasis   . Obesity   . Osteoporosis   . Peripheral vascular disease ([place])   . 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 Spider veins    Past Surgical History:  Procedure Laterality Date  . CARDIAC CATHETERIZATION     2010 Does not see a cardiac  doctor  . Colonsopy    . DENTAL SURGERY    . ECTOPIC PREGNANCY SURGERY    . EYE SURGERY Bilateral 2011,2012   cataracts  . FRACTURE SURGERY  2009   hip, rod from knee to hip  . HEMORRHOID SURGERY    . IVC filter Left    pt states it has turned side ways and  could not be removed.  Marland Kitchen KYPHOPLASTY  09/03/2012   per patient, she has had kypho x 2:  Surgeon: Winfield Cunas, MD;  Location: [facility];  Service: Neurosurgery;  Laterality: N/A;  Thoracic eight Kyphoplasty  . OPEN REDUCTION INTERNAL FIXATION (ORIF) DISTAL RADIAL FRACTURE Left 09/06/2015   Procedure: OPEN REDUCTION INTERNAL FIXATION (ORIF) DISTAL RADIAL FRACTURE;  Surgeon: Hessie Knows, MD;  Location: [facility];  Service: Orthopedics;  Laterality: Left;  . RIGHT OOPHORECTOMY     partial-  . SKIN CANCER EXCISION     top of head  . TONSILLECTOMY     age 33  . VERTEBROPLASTY  12/31/2011   Procedure: VERTEBROPLASTY;  Surgeon: Winfield Cunas, MD;  Location: [facility];  Service: Neurosurgery;  Laterality: N/A;  Thoracic eleven vertebroplasty   Family History  Problem Relation Age of Onset  . Heart disease Father     myocardial infarction  . Heart disease Brother     myocardial infarction  . Lymphoma Sister   . Anesthesia problems Neg Hx   . Breast cancer Neg Hx   . Colon cancer Neg Hx  Social History   Social History  . Marital status: Widowed    Spouse name: N/A  . Number of children: 0  . Years of education: N/A   Social History Main Topics  . Smoking status: Never Smoker  . Smokeless tobacco: Never Used  . Alcohol use No  . Drug use: No  . Sexual activity: Not Asked   Other Topics Concern  . None   Social History Narrative  . None    Outpatient Encounter Prescriptions as of 06/18/2016  Medication Sig  . acetaminophen (TYLENOL) 650 MG CR tablet Take 650 mg by mouth every 8 (eight) hours as needed for pain.   Marland Kitchen apixaban (ELIQUIS) 5 MG TABS tablet Take 1 tablet (5 mg total) by mouth 2 (two) times daily.  . bifidobacterium infantis (ALIGN) capsule Take 1 capsule by mouth daily.  . Calcium Carb-Cholecalciferol (CALCIUM 600 + D PO) Take 1 tablet by mouth 3 (three) times daily.  . clindamycin (CLEOCIN) 300 MG capsule Take 1 capsule (300 mg total) by mouth 3 (three)  times daily.  . furosemide (LASIX) 20 MG tablet Take 1 tablet (20 mg total) by mouth daily.  . hydrocerin (EUCERIN) CREA Apply 1 application topically 2 (two) times daily.  . hydrOXYzine (ATARAX/VISTARIL) 10 MG tablet TAKE 2 & 1/2 TABLETS BY MOUTH 3 TIMES DAILY AS NEEDED FOR ITCHING  . Misc Natural Products (OSTEO BI-FLEX JOINT SHIELD PO) Take 1 tablet by mouth 2 (two) times daily.  . mupirocin ointment (BACTROBAN) 2 % Apply to affected area bid  . omeprazole (PRILOSEC) 20 MG capsule Take 1 capsule by mouth  daily  . ramipril (ALTACE) 5 MG capsule Take 1 capsule by mouth  daily  . sertraline (ZOLOFT) 50 MG tablet Take 1 tablet by mouth  daily  . triamcinolone cream (KENALOG) 0.5 % Apply 1 application topically 2 (two) times daily.   No facility-administered encounter medications on file as of 06/18/2016.     Review of Systems  Constitutional: Negative for appetite change and unexpected weight change.  HENT: Negative for congestion and sinus pressure.   Respiratory: Negative for chest tightness and shortness of breath.   Cardiovascular: Positive for leg swelling. Negative for chest pain.  Gastrointestinal: Negative for diarrhea, nausea and vomiting.  Musculoskeletal: Negative for myalgias.  Skin:       Lower extremity stasis changes and some increased erythema.    Neurological: Negative for dizziness, light-headedness and headaches.  Psychiatric/Behavioral: Negative for agitation and dysphoric mood.       Objective:    Physical Exam  Constitutional: She appears well-developed and well-nourished. No distress.  HENT:  Mouth/Throat: Oropharynx is clear and moist.  Neck: Neck supple.  Cardiovascular: Normal rate and regular rhythm.   Pulmonary/Chest: Breath sounds normal. No respiratory distress. She has no wheezes.  Abdominal: Soft. Bowel sounds are normal. There is no tenderness.  Musculoskeletal:  Right lower extremity larger than left.  Increased pedal and lower extremity  swelling.  Some increased erythema. Stasis changes present.  Increased tenderness to palpation.    Lymphadenopathy:    She has no cervical adenopathy.  Psychiatric: She has a normal mood and affect. Her behavior is normal.    BP 140/72   Pulse 72   Temp 97.7 F (36.5 C) (Oral)   Ht 5\' 1"  (1.549 m)   Wt 127 lb 3.2 oz (57.7 kg)   SpO2 94%   BMI 24.03 kg/m  Wt Readings from Last 3 Encounters:  06/18/16 127 lb 3.2 oz (  57.7 kg)  06/11/16 129 lb 9.6 oz (58.8 kg)  09/06/15 120 lb (54.4 kg)     Lab Results  Component Value Date   WBC 6.9 06/11/2016   HGB 10.8 (L) 06/11/2016   HCT 32.7 (L) 06/11/2016   PLT 158.0 06/11/2016   GLUCOSE 93 06/18/2016   CHOL 229 (H) 07/12/2012   TRIG 87.0 07/12/2012   HDL 86.30 07/12/2012   LDLDIRECT 135.9 07/12/2012   ALT 10 06/11/2016   AST 19 06/11/2016   NA 136 06/18/2016   K 4.4 06/18/2016   CL 102 06/18/2016   CREATININE 1.07 06/18/2016   BUN 21 06/18/2016   CO2 25 06/18/2016   TSH 1.61 06/11/2016   INR 1.00 01/24/2015    US Venous Img Lower Unilateral Right  Result Date: 06/11/2016 CLINICAL DATA:  Right lower extremity edema, pain and erythema. History of prior DVT in the left lower extremity. EXAM: RIGHT LOWER EXTREMITY VENOUS DOPPLER ULTRASOUND TECHNIQUE: Gray-scale sonography with graded compression, as well as color Doppler and duplex ultrasound were performed to evaluate the lower extremity deep venous systems from the level of the common femoral vein and including the common femoral, femoral, profunda femoral, popliteal and calf veins including the posterior tibial, peroneal and gastrocnemius veins when visible. The superficial great saphenous vein was also interrogated. Spectral Doppler was utilized to evaluate flow at rest and with distal augmentation maneuvers in the common femoral, femoral and popliteal veins. COMPARISON:  None. FINDINGS: Contralateral Common Femoral Vein: Respiratory phasicity is normal and symmetric with the  symptomatic side. No evidence of thrombus. Normal compressibility. Common Femoral Vein: No evidence of thrombus. Normal compressibility, respiratory phasicity and response to augmentation. Saphenofemoral Junction: No evidence of thrombus. Normal compressibility and flow on color Doppler imaging. Profunda Femoral Vein: No evidence of thrombus. Normal compressibility and flow on color Doppler imaging. Femoral Vein: No evidence of thrombus. Normal compressibility, respiratory phasicity and response to augmentation. Popliteal Vein: No evidence of thrombus. Normal compressibility, respiratory phasicity and response to augmentation. Calf Veins: No evidence of thrombus. Normal compressibility and flow on color Doppler imaging. Superficial Great Saphenous Vein: No evidence of thrombus. Normal compressibility and flow on color Doppler imaging. Venous Reflux:  None. Other Findings: No evidence of superficial thrombophlebitis or abnormal fluid collection. IMPRESSION: No evidence of right lower extremity deep venous thrombosis. Electronically Signed   By: Aletta Edouard M.D.   On: 06/11/2016 13:55       Assessment & Plan:   Problem List Items Addressed This Visit    Anemia    hgb relatively stable.  Declines further w/up.  Follow cbc.       Hypertension    Blood pressure under reasonable control.  Same medication regimen.  Follow.        Renal insufficiency    Last creatinine elevated 1.4.  Recheck today.  Will check urinalysis.  Stay hydrated.        Swelling of right lower extremity    Persistent.  Unable to appreciate a good pulse - right leg.  Hard for her to get her compression hose on.  Stasis changes present.  Some increased erythema.  Some better than last week.  Has been using bactroban.  Refer to vascular surgery for further evaluation.        Relevant Orders   Ambulatory referral to Vascular Surgery    Other Visit Diagnoses   None.      [name], MD

## 2016-06-18 NOTE — Progress Notes (Signed)
Pre visit review using our clinic review tool, if applicable. No additional management support is needed unless otherwise documented below in the visit note. 

## 2016-06-19 ENCOUNTER — Other Ambulatory Visit: Payer: Self-pay | Admitting: Internal Medicine

## 2016-06-19 ENCOUNTER — Encounter: Payer: Self-pay | Admitting: Internal Medicine

## 2016-06-19 DIAGNOSIS — N390 Urinary tract infection, site not specified: Secondary | ICD-10-CM | POA: Diagnosis not present

## 2016-06-19 DIAGNOSIS — R8281 Pyuria: Secondary | ICD-10-CM

## 2016-06-19 NOTE — Assessment & Plan Note (Signed)
Last creatinine elevated 1.4.  Recheck today.  Will check urinalysis.  Stay hydrated.

## 2016-06-19 NOTE — Assessment & Plan Note (Signed)
Blood pressure under reasonable control.  Same medication regimen.  Follow.   

## 2016-06-19 NOTE — Progress Notes (Signed)
Added urine culture

## 2016-06-19 NOTE — Addendum Note (Signed)
Addended by: Frutoso Chase A on: 06/19/2016 08:53 AM   Modules accepted: Orders

## 2016-06-19 NOTE — Assessment & Plan Note (Signed)
Persistent.  Unable to appreciate a good pulse - right leg.  Hard for her to get her compression hose on.  Stasis changes present.  Some increased erythema.  Some better than last week.  Has been using bactroban.  Refer to vascular surgery for further evaluation.

## 2016-06-19 NOTE — Assessment & Plan Note (Signed)
hgb relatively stable.  Declines further w/up.  Follow cbc.

## 2016-06-21 LAB — CULTURE, URINE COMPREHENSIVE

## 2016-06-23 ENCOUNTER — Other Ambulatory Visit: Payer: Self-pay | Admitting: Internal Medicine

## 2016-06-23 DIAGNOSIS — L989 Disorder of the skin and subcutaneous tissue, unspecified: Secondary | ICD-10-CM

## 2016-06-23 MED ORDER — LEVOFLOXACIN 250 MG PO TABS: 250.0000 mg | ORAL_TABLET | Freq: Every day | ORAL | 0 refills | Status: DC

## 2016-06-23 NOTE — Progress Notes (Signed)
rx sent in for levaquin 250mg  q day for five days.

## 2016-06-23 NOTE — Progress Notes (Signed)
Order placed for dermatology referral.  

## 2016-06-24 ENCOUNTER — Encounter (INDEPENDENT_AMBULATORY_CARE_PROVIDER_SITE_OTHER): Payer: Self-pay | Admitting: Vascular Surgery

## 2016-06-24 ENCOUNTER — Ambulatory Visit (INDEPENDENT_AMBULATORY_CARE_PROVIDER_SITE_OTHER): Payer: Medicare Other | Admitting: Vascular Surgery

## 2016-06-24 VITALS — BP 167/75 | HR 79 | Resp 16 | Ht 61.0 in | Wt 127.0 lb

## 2016-06-24 DIAGNOSIS — E7800 Pure hypercholesterolemia, unspecified: Secondary | ICD-10-CM | POA: Diagnosis not present

## 2016-06-24 DIAGNOSIS — I87009 Postthrombotic syndrome without complications of unspecified extremity: Secondary | ICD-10-CM | POA: Insufficient documentation

## 2016-06-24 DIAGNOSIS — I825Y9 Chronic embolism and thrombosis of unspecified deep veins of unspecified proximal lower extremity: Secondary | ICD-10-CM | POA: Diagnosis not present

## 2016-06-24 DIAGNOSIS — M7989 Other specified soft tissue disorders: Secondary | ICD-10-CM

## 2016-06-24 DIAGNOSIS — I10 Essential (primary) hypertension: Secondary | ICD-10-CM | POA: Diagnosis not present

## 2016-06-24 DIAGNOSIS — Z95828 Presence of other vascular implants and grafts: Secondary | ICD-10-CM

## 2016-06-24 DIAGNOSIS — N289 Disorder of kidney and ureter, unspecified: Secondary | ICD-10-CM

## 2016-06-24 NOTE — Assessment & Plan Note (Signed)
blood pressure control important in reducing the progression of atherosclerotic disease. On appropriate oral medications.  

## 2016-06-24 NOTE — Assessment & Plan Note (Signed)
She has a history of IVC filter previous perforation. This was about 7 years ago and she has done well with observation. No plans for any other therapy.

## 2016-06-24 NOTE — Patient Instructions (Signed)
  Facts you should know Swelling: Prevention and Control  After a Stroke or Brain Injury, sometimes the fluid in your body has a hard time moving out of your arms and legs.  This can cause swelling.  To help control the swelling, follow these suggestions:  1. When sitting or lying down, do not let your arm hang.  Keep your arm resting on a pillow.  Try to place your hand above the level of your heart as much as possible. 2. If you notice swelling in your feet and legs, place feet on top of pillows.  This works best if you are lying on your back in bed or on the couch. 3. Perform the following massage techniques three times a day: 1. Elevate your arm on pillows above the level of your heart.  Make sure your hand is elevated higher than your elbow. 2. Put lotion on the had to be massaged 3. Using deep pressure, first massage fluid out of each finger and thumb. Beginning at the fingertips, move the fluid down into the hand.  Then massage the fluid out of the palm and back of the hand toward the wrist and elbow. 4. Continue to massage for about 15 minutes   Always massage from the fingertips toward the elbow and shoulder.  Never massage toward the fingers.

## 2016-06-24 NOTE — Assessment & Plan Note (Signed)
lipid control important in reducing the progression of atherosclerotic disease.   

## 2016-06-24 NOTE — Assessment & Plan Note (Signed)
Could certainly be contributing to lower extremity swelling.

## 2016-06-24 NOTE — Assessment & Plan Note (Signed)
Her swelling is better today, but she has had intermittent significant bouts of swelling in the right lower extremity. Given her history of DVT and previous IVC filter, postphlebitic symptoms are likely contributing. She also has a significant number of medical comorbidities which may be contributing. I have recommended a venous reflux study for further evaluation of her lower extremity. If this is unrevealing, she may benefit from a lymphedema pump from chronic scarring of lymphatic channels. she should continue to wear her compression stockings and elevate her legs. I will see her back following her ultrasound.

## 2016-06-24 NOTE — Assessment & Plan Note (Signed)
With her previous history of DVT and IVC filter placement in the known problem with the IVC filter, I suspect that some of her symptoms are postphlebitic in nature. She had a negative DVT study earlier this year so I do not think this is an acute clot. Compression stockings, elevation, and potentially a lymphedema pump that she has developed chronic scarring of the lymphatic channels may be of benefit. We will see her back following her venous reflux study.

## 2016-06-24 NOTE — Progress Notes (Signed)
Patient ID: Kayla Galloway, female   DOB: 07/31/1921, 82 y.o.   MRN: [id number]  Chief Complaint  Patient presents with  . New Evaluation    Right leg swelling    HPI Kayla Galloway is a 82 y.o. female.  I am asked to see the patient by Dr. Nicki Reaper for evaluation of worsening right lower extremity swelling.  The patient reports intermittent flares and swelling to the point where she will develop weeping from the foot and ankle. She had one of these a couple of weeks ago that has now healed. An increased dose and Lasix seemed to have helped. There is no clear inciting event or causative factor that worsen the swelling. I last saw her at a previous practice about 6-7 years ago. She had an IVC filter placed with perforation of the IVC but no removal was performed as this was not technically possible from a percutaneous standpoint. Given her age observation was selected, and she did quite well with that. She has remained on anticoagulation. She has tolerated this well without bleeding. She has some chronic left lower extremity swelling that is mild in reasonably stable. She wears compression stockings daily and elevates her legs as much as possible.   Past Medical History:  Diagnosis Date  . Anemia   . Arthritis   . Cancer ([place])    skin ca lesion of scalp- removed  . Depression   . DVT (deep venous thrombosis), left    bilateral, IVC filter 2010  . GERD (gastroesophageal reflux disease)   . Gout   . Hypercholesterolemia   . Hyperkalemia   . Hypertension    Dr. [name]  . Nephrolithiasis   . Obesity   . Osteoporosis   . Peripheral vascular disease ([place])   . 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 Spider veins     Past Surgical History:  Procedure Laterality Date  . CARDIAC CATHETERIZATION     2010 Does not see a cardiac  doctor  . Colonsopy    . DENTAL SURGERY    .  ECTOPIC PREGNANCY SURGERY    . EYE SURGERY Bilateral 2011,2012   cataracts  . FRACTURE SURGERY  2009   hip, rod from knee to hip  . HEMORRHOID SURGERY    . IVC filter Left    pt states it has turned side ways and could not be removed.  Marland Kitchen KYPHOPLASTY  09/03/2012   per patient, she has had kypho x 2:  Surgeon: Winfield Cunas, MD;  Location: [facility];  Service: Neurosurgery;  Laterality: N/A;  Thoracic eight Kyphoplasty  . OPEN REDUCTION INTERNAL FIXATION (ORIF) DISTAL RADIAL FRACTURE Left 09/06/2015   Procedure: OPEN REDUCTION INTERNAL FIXATION (ORIF) DISTAL RADIAL FRACTURE;  Surgeon: Hessie Knows, MD;  Location: [facility];  Service: Orthopedics;  Laterality: Left;  . RIGHT OOPHORECTOMY     partial-  . SKIN CANCER EXCISION     top of head  . TONSILLECTOMY     age 79  . VERTEBROPLASTY  12/31/2011   Procedure: VERTEBROPLASTY;  Surgeon: Winfield Cunas, MD;  Location: [facility];  Service: Neurosurgery;  Laterality: N/A;  Thoracic eleven vertebroplasty    Family History  Problem Relation Age of Onset  . Heart disease Father     myocardial infarction  . Heart disease Brother     myocardial infarction  .  Lymphoma Sister   . Anesthesia problems Neg Hx   . Breast cancer Neg Hx   . Colon cancer Neg Hx      Social History Social History  Substance Use Topics  . Smoking status: Never Smoker  . Smokeless tobacco: Never Used  . Alcohol use No  Still lives independently. No IV drug use.  Allergies  Allergen Reactions  . Cefuroxime Axetil Rash    Pt states that she does fine with Keflex.    . Doxycycline Nausea Only and Other (See Comments)    Reaction:  Weight loss   . Advil [Ibuprofen] Swelling  . Celebrex [Celecoxib] Nausea Only  . Daypro [Oxaprozin] Other (See Comments)    Reaction:  Dizziness   . Etodolac Nausea Only  . Macrobid [employer] Macro] Other (See Comments)    Reaction:  Burning   . Penicillins Other (See Comments)    Reaction:  Burning   .  Percocet [Oxycodone-Acetaminophen] Nausea Only and Swelling  . Tramadol Other (See Comments)    Reaction:  Unknown   . Valium [Diazepam] Other (See Comments)    Reaction:  Dizziness    . Neosporin [Neomycin-Bacitracin Zn-Polymyx] Rash  . Vicodin [Hydrocodone-Acetaminophen] Nausea Only    Current Outpatient Prescriptions  Medication Sig Dispense Refill  . acetaminophen (TYLENOL) 650 MG CR tablet Take 650 mg by mouth every 8 (eight) hours as needed for pain.     Marland Kitchen apixaban (ELIQUIS) 5 MG TABS tablet Take 1 tablet (5 mg total) by mouth 2 (two) times daily. 60 tablet 0  . bifidobacterium infantis (ALIGN) capsule Take 1 capsule by mouth daily. 30 capsule 0  . Calcium Carb-Cholecalciferol (CALCIUM 600 + D PO) Take 1 tablet by mouth 3 (three) times daily.    . furosemide (LASIX) 20 MG tablet Take 1 tablet (20 mg total) by mouth daily. 90 tablet 2  . Glucosamine-Chondroitin 250-200 MG TABS Take by mouth.    . hydrocerin (EUCERIN) CREA Apply 1 application topically 2 (two) times daily. 113 g 1  . hydrOXYzine (ATARAX/VISTARIL) 10 MG tablet TAKE 2 & 1/2 TABLETS BY MOUTH 3 TIMES DAILY AS NEEDED FOR ITCHING 30 tablet 0  . levofloxacin (LEVAQUIN) 250 MG tablet Take 1 tablet (250 mg total) by mouth daily. 5 tablet 0  . Misc Natural Products (OSTEO BI-FLEX JOINT SHIELD PO) Take 1 tablet by mouth 2 (two) times daily.    . mupirocin ointment (BACTROBAN) 2 % Apply to affected area bid 22 g 0  . omeprazole (PRILOSEC) 20 MG capsule Take 1 capsule by mouth  daily 90 capsule 2  . ramipril (ALTACE) 5 MG capsule Take 1 capsule by mouth  daily 90 capsule 3  . sertraline (ZOLOFT) 50 MG tablet Take 1 tablet by mouth  daily 90 tablet 0  . triamcinolone cream (KENALOG) 0.5 % Apply 1 application topically 2 (two) times daily. 30 g 1   No current facility-administered medications for this visit.       REVIEW OF SYSTEMS (Negative unless checked)  Constitutional: [] Weight loss  [] Fever  [] Chills Cardiac: [] Chest  pain   [] Chest pressure   [] Palpitations   [] Shortness of breath when laying flat   [] Shortness of breath at rest   [] Shortness of breath with exertion. Vascular:  [] Pain in legs with walking   [] Pain in legs at rest   [] Pain in legs when laying flat   [] Claudication   [] Pain in feet when walking  [] Pain in feet at rest  [] Pain in feet  when laying flat   [x] History of DVT   [] Phlebitis   [x] Swelling in legs   [] Varicose veins   [] Non-healing ulcers Pulmonary:   [] Uses home oxygen   [] Productive cough   [] Hemoptysis   [] Wheeze  [] COPD   [] Asthma Neurologic:  [] Dizziness  [] Blackouts   [] Seizures   [] History of stroke   [] History of TIA  [] Aphasia   [] Temporary blindness   [] Dysphagia   [] Weakness or numbness in arms   [] Weakness or numbness in legs Musculoskeletal:  [x] Arthritis   [] Joint swelling   [] Joint pain   [] Low back pain Hematologic:  [] Easy bruising  [] Easy bleeding   [] Hypercoagulable state   [] Anemic  [] Hepatitis Gastrointestinal:  [] Blood in stool   [] Vomiting blood  [] Gastroesophageal reflux/heartburn   [] Abdominal pain Genitourinary:  [] Chronic kidney disease   [] Difficult urination  [] Frequent urination  [] Burning with urination   [] Hematuria Skin:  [] Rashes   [] Ulcers   [] Wounds Psychological:  [] History of anxiety   []  History of major depression.    Physical Exam BP (!) 167/75 (BP Location: Left Arm)   Pulse 79   Resp 16   Ht 5\' 1"  (1.549 m)   Wt 127 lb (57.6 kg)   BMI 24.00 kg/m  Gen:  WD/WN, NAD. Appears younger than stated age Head: [place]/AT, No temporalis wasting. Prominent temp pulse not noted. Ear/Nose/Throat: Hearing grossly intact, nares w/o erythema or drainage, oropharynx w/o Erythema/Exudate Eyes: Conjunctiva clear, sclera non-icteric  Neck: trachea midline.  No JVD.  Pulmonary:  Good air movement, no labored respirations, equal bilaterally.  Cardiac: RRR, normal S1, S2 Vascular:  Vessel Right Left  Radial Palpable Palpable  Ulnar Palpable Palpable  Brachial  Palpable Palpable  Carotid Palpable, without bruit Palpable, without bruit  Aorta Not palpable N/A  Femoral Palpable Palpable  Popliteal Palpable Palpable  PT Palpable Palpable  DP Palpable Palpable   Gastrointestinal: soft, non-tender/non-distended. No guarding/reflex. No masses, surgical incisions, or scars. Musculoskeletal: M/S 5/5 throughout.  Extremities without ischemic changes.  No deformity or atrophy. 1-2+ right lower extremity edema and 1+ left lower extremity edema. Diffuse varicosities throughout bilateral lower legs with worse stasis changes on the left than the right Neurologic: Sensation grossly intact in extremities.  Symmetrical.  Speech is fluent. Motor exam as listed above. Psychiatric: Judgment intact, Mood & affect appropriate for pt's clinical situation. Dermatologic: No rashes or ulcers noted.  No cellulitis or open wounds. Lymph : No Cervical, Axillary, or Inguinal lymphadenopathy.   Radiology US Venous Img Lower Unilateral Right  Result Date: 06/11/2016 CLINICAL DATA:  Right lower extremity edema, pain and erythema. History of prior DVT in the left lower extremity. EXAM: RIGHT LOWER EXTREMITY VENOUS DOPPLER ULTRASOUND TECHNIQUE: Gray-scale sonography with graded compression, as well as color Doppler and duplex ultrasound were performed to evaluate the lower extremity deep venous systems from the level of the common femoral vein and including the common femoral, femoral, profunda femoral, popliteal and calf veins including the posterior tibial, peroneal and gastrocnemius veins when visible. The superficial great saphenous vein was also interrogated. Spectral Doppler was utilized to evaluate flow at rest and with distal augmentation maneuvers in the common femoral, femoral and popliteal veins. COMPARISON:  None. FINDINGS: Contralateral Common Femoral Vein: Respiratory phasicity is normal and symmetric with the symptomatic side. No evidence of thrombus. Normal  compressibility. Common Femoral Vein: No evidence of thrombus. Normal compressibility, respiratory phasicity and response to augmentation. Saphenofemoral Junction: No evidence of thrombus. Normal compressibility and flow on color Doppler imaging.  Profunda Femoral Vein: No evidence of thrombus. Normal compressibility and flow on color Doppler imaging. Femoral Vein: No evidence of thrombus. Normal compressibility, respiratory phasicity and response to augmentation. Popliteal Vein: No evidence of thrombus. Normal compressibility, respiratory phasicity and response to augmentation. Calf Veins: No evidence of thrombus. Normal compressibility and flow on color Doppler imaging. Superficial Great Saphenous Vein: No evidence of thrombus. Normal compressibility and flow on color Doppler imaging. Venous Reflux:  None. Other Findings: No evidence of superficial thrombophlebitis or abnormal fluid collection. IMPRESSION: No evidence of right lower extremity deep venous thrombosis. Electronically Signed   By: Aletta Edouard M.D.   On: 06/11/2016 13:55    Labs Recent Results (from the past 2160 hour(s))  Comprehensive metabolic panel     Status: Abnormal   Collection Time: 06/11/16 11:44 AM  Result Value Ref Range   Sodium 136 135 - 145 mEq/L   Potassium 4.1 3.5 - 5.1 mEq/L   Chloride 102 96 - 112 mEq/L   CO2 25 19 - 32 mEq/L   Glucose, Bld 93 70 - 99 mg/dL   BUN 27 (H) 6 - 23 mg/dL   Creatinine, Ser 1.40 (H) 0.40 - 1.20 mg/dL   Total Bilirubin 0.5 0.2 - 1.2 mg/dL   Alkaline Phosphatase 49 39 - 117 U/L   AST 19 0 - 37 U/L   ALT 10 0 - 35 U/L   Total Protein 6.7 6.0 - 8.3 g/dL   Albumin 4.2 3.5 - 5.2 g/dL   Calcium 9.3 8.4 - 10.5 mg/dL   GFR 37.30 (L) >60.00 mL/min  CBC with Differential/Platelet     Status: Abnormal   Collection Time: 06/11/16 11:44 AM  Result Value Ref Range   WBC 6.9 4.0 - 10.5 K/uL   RBC 3.41 (L) 3.87 - 5.11 Mil/uL   Hemoglobin 10.8 (L) 12.0 - 15.0 g/dL   HCT 32.7 (L) 36.0 - 46.0 %     MCV 95.6 78.0 - 100.0 fl   MCHC 33.2 30.0 - 36.0 g/dL   RDW 14.9 11.5 - 15.5 %   Platelets 158.0 150.0 - 400.0 K/uL   Neutrophils Relative % 63.7 43.0 - 77.0 %   Lymphocytes Relative 22.0 12.0 - 46.0 %   Monocytes Relative 11.1 3.0 - 12.0 %   Eosinophils Relative 3.0 0.0 - 5.0 %   Basophils Relative 0.2 0.0 - 3.0 %   Neutro Abs 4.4 1.4 - 7.7 K/uL   Lymphs Abs 1.5 0.7 - 4.0 K/uL   Monocytes Absolute 0.8 0.1 - 1.0 K/uL   Eosinophils Absolute 0.2 0.0 - 0.7 K/uL   Basophils Absolute 0.0 0.0 - 0.1 K/uL  TSH     Status: None   Collection Time: 06/11/16 11:44 AM  Result Value Ref Range   TSH 1.61 0.35 - 4.50 uIU/mL  Urinalysis, Routine w reflex microscopic (not at [facility])     Status: Abnormal   Collection Time: 06/18/16 10:40 AM  Result Value Ref Range   Color, Urine YELLOW Yellow;Lt. Yellow   APPearance SL CLOUDY (A) Clear   Specific Gravity, Urine 1.010 1.000 - 1.030   pH 7.0 5.0 - 8.0   Total Protein, Urine NEGATIVE Negative   Urine Glucose NEGATIVE Negative   Ketones, ur NEGATIVE Negative   Bilirubin Urine NEGATIVE Negative   Hgb urine dipstick NEGATIVE Negative   Urobilinogen, UA 0.2 0.0 - 1.0   Leukocytes, UA SMALL (A) Negative   Nitrite POSITIVE (A) Negative   WBC, UA TNTC(>50/hpf) (A) 0-2/hpf  RBC / HPF 0-2/hpf 0-2/hpf   Squamous Epithelial / LPF Few(5-10/hpf) (A) Rare(0-4/hpf)   Bacteria, UA Many(>50/hpf) (A) None  Basic metabolic panel     Status: Abnormal   Collection Time: 06/18/16 10:40 AM  Result Value Ref Range   Sodium 136 135 - 145 mEq/L   Potassium 4.4 3.5 - 5.1 mEq/L   Chloride 102 96 - 112 mEq/L   CO2 25 19 - 32 mEq/L   Glucose, Bld 93 70 - 99 mg/dL   BUN 21 6 - 23 mg/dL   Creatinine, Ser 1.07 0.40 - 1.20 mg/dL   Calcium 9.5 8.4 - 10.5 mg/dL   GFR 50.86 (L) >60.00 mL/min  CULTURE, URINE COMPREHENSIVE     Status: None   Collection Time: 06/19/16  8:53 AM  Result Value Ref Range   Culture PROTEUS MIRABILIS     Comment: This organism may show imipenem  resistance by mechanisms other than a carbapenemase.    Colony Count Greater than 100,000 CFU/mL    Organism ID, Bacteria PROTEUS MIRABILIS       Susceptibility   Proteus mirabilis -  (no method available)    AMPICILLIN <=2 Sensitive     AMOX/CLAVULANIC <=2 Sensitive     AMPICILLIN/SULBACTAM <=2 Sensitive     PIP/TAZO <=4 Sensitive     IMIPENEM 8 Resistant     CEFAZOLIN  Sensitive     CEFTRIAXONE <=1 Sensitive     CEFTAZIDIME <=1 Sensitive     CEFEPIME <=1 Sensitive     GENTAMICIN <=1 Sensitive     TOBRAMYCIN <=1 Sensitive     CIPROFLOXACIN <=0.25 Sensitive     LEVOFLOXACIN <=0.12 Sensitive     NITROFURANTOIN 128 Resistant     TRIMETH/SULFA* <=20 Sensitive      * NR=NOT REPORTABLE,SEE COMMENTORAL therapy:A cefazolin MIC of <32 predicts susceptibility to the oral agents cefaclor,cefdinir,cefpodoxime,cefprozil,cefuroxime,cephalexin,and loracarbef when used for therapy of uncomplicated UTIs due to E.coli,K.pneumomiae,and P.mirabilis. PARENTERAL therapy: A cefazolinMIC of >8 indicates resistance to parenteralcefazolin. An alternate test method must beperformed to confirm susceptibility to parenteralcefazolin.    Assessment/Plan:  Renal insufficiency Could certainly be contributing to lower extremity swelling.  Hypercholesterolemia lipid control important in reducing the progression of atherosclerotic disease.    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 She had a negative DVT study earlier this year so I do not think this is an acute clot. Compression stockings, elevation, and potentially a lymphedema pump that she has developed chronic scarring of the lymphatic channels may be of benefit. We will see her back following her venous reflux study.  Hypertension blood pressure control important in reducing the progression  of atherosclerotic disease. On appropriate oral medications.   S/P IVC filter She has a history of IVC filter previous perforation. This was about 7 years ago and she has done well with observation. No plans for any other therapy.  Swelling of right lower extremity Her swelling is better today, but she has had intermittent significant bouts of swelling in the right lower extremity. Given her history of DVT and previous IVC filter, postphlebitic symptoms are likely contributing. She also has a significant number of medical comorbidities which may be contributing. I have recommended a venous reflux study for further evaluation of her lower extremity. If this is unrevealing, she may benefit from a lymphedema pump from chronic scarring of lymphatic  channels. she should continue to wear her compression stockings and elevate her legs. I will see her back following her ultrasound.       Leotis Pain 06/24/2016, 12:30 PM   This note was created with Dragon medical transcription system.  Any errors from dictation are unintentional.

## 2016-06-30 DIAGNOSIS — L820 Inflamed seborrheic keratosis: Secondary | ICD-10-CM | POA: Diagnosis not present

## 2016-06-30 DIAGNOSIS — D485 Neoplasm of uncertain behavior of skin: Secondary | ICD-10-CM | POA: Diagnosis not present

## 2016-07-28 ENCOUNTER — Other Ambulatory Visit: Payer: Self-pay | Admitting: Internal Medicine

## 2016-08-01 ENCOUNTER — Ambulatory Visit (INDEPENDENT_AMBULATORY_CARE_PROVIDER_SITE_OTHER): Payer: Medicare Other | Admitting: Internal Medicine

## 2016-08-01 ENCOUNTER — Encounter: Payer: Self-pay | Admitting: Internal Medicine

## 2016-08-01 DIAGNOSIS — R634 Abnormal weight loss: Secondary | ICD-10-CM

## 2016-08-01 DIAGNOSIS — I10 Essential (primary) hypertension: Secondary | ICD-10-CM | POA: Diagnosis not present

## 2016-08-01 DIAGNOSIS — D649 Anemia, unspecified: Secondary | ICD-10-CM

## 2016-08-01 DIAGNOSIS — N289 Disorder of kidney and ureter, unspecified: Secondary | ICD-10-CM | POA: Diagnosis not present

## 2016-08-01 DIAGNOSIS — E7800 Pure hypercholesterolemia, unspecified: Secondary | ICD-10-CM | POA: Diagnosis not present

## 2016-08-01 NOTE — Progress Notes (Signed)
Patient ID: Kayla Galloway, female   DOB: 1921/05/03, 82 y.o.   MRN: [id number]   Subjective:    Patient ID: Kayla Galloway, female    DOB: 15-Nov-1920, 82 y.o.   MRN: [id number]  HPI  Patient here for a scheduled follow up. She is accompanied by her sister.  History obtained from both of them.  The redness in her legs has improved.  Swelling has decreased.  She is unable to wear compression hose regularly.  Hard for her to get on.  She saw vascular surgery.  Planning for ultrasound and further w/up.  She feels her appetite is good.  Sister agrees.  Breathing stable.  Able to get around and do what is needed at home.  No nausea or vomiting.  Bowels stable.     Past Medical History:  Diagnosis Date  . Anemia   . Arthritis   . Cancer ([place])    skin ca lesion of scalp- removed  . Depression   . DVT (deep venous thrombosis), left    bilateral, IVC filter 2010  . GERD (gastroesophageal reflux disease)   . Gout   . Hypercholesterolemia   . Hyperkalemia   . Hypertension    Dr. [name]  . Nephrolithiasis   . Obesity   . Osteoporosis   . Peripheral vascular disease ([place])   . 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 Spider veins    Past Surgical History:  Procedure Laterality Date  . CARDIAC CATHETERIZATION     2010 Does not see a cardiac  doctor  . Colonsopy    . DENTAL SURGERY    . ECTOPIC PREGNANCY SURGERY    . EYE SURGERY Bilateral 2011,2012   cataracts  . FRACTURE SURGERY  2009   hip, rod from knee to hip  . HEMORRHOID SURGERY    . IVC filter Left    pt states it has turned side ways and could not be removed.  Marland Kitchen KYPHOPLASTY  09/03/2012   per patient, she has had kypho x 2:  Surgeon: Winfield Cunas, MD;  Location: [place] [facility];  Service: Neurosurgery;  Laterality: N/A;  Thoracic eight Kyphoplasty  . OPEN REDUCTION INTERNAL FIXATION (ORIF) DISTAL RADIAL FRACTURE Left  09/06/2015   Procedure: OPEN REDUCTION INTERNAL FIXATION (ORIF) DISTAL RADIAL FRACTURE;  Surgeon: Hessie Knows, MD;  Location: [facility] OR[facility];  Service: Orthopedics;  Laterality: Left;  . RIGHT OOPHORECTOMY     partial-  . SKIN CANCER EXCISION     top of head  . TONSILLECTOMY     age 27  . VERTEBROPLASTY  12/31/2011   Procedure: VERTEBROPLASTY;  Surgeon: Winfield Cunas, MD;  Location: [place] [facility];  Service: Neurosurgery;  Laterality: N/A;  Thoracic eleven vertebroplasty   Family History  Problem Relation Age of Onset  . Heart disease Father     myocardial infarction  . Heart disease Brother     myocardial infarction  . Lymphoma Sister   . Anesthesia problems Neg Hx   . Breast cancer Neg Hx   . Colon cancer Neg Hx    Social History   Social History  . Marital status: Widowed    Spouse name: N/A  . Number of children: 0  . Years of education: N/A   Social History Main Topics  . Smoking status: Never Smoker  . Smokeless tobacco: Never Used  . Alcohol  use No  . Drug use: No  . Sexual activity: Not Asked   Other Topics Concern  . None   Social History Narrative  . None    Outpatient Encounter Prescriptions as of 08/01/2016  Medication Sig  . acetaminophen (TYLENOL) 650 MG CR tablet Take 650 mg by mouth every 8 (eight) hours as needed for pain.   Marland Kitchen apixaban (ELIQUIS) 5 MG TABS tablet Take 1 tablet (5 mg total) by mouth 2 (two) times daily.  . bifidobacterium infantis (ALIGN) capsule Take 1 capsule by mouth daily.  . Calcium Carb-Cholecalciferol (CALCIUM 600 + D PO) Take 1 tablet by mouth 3 (three) times daily.  . furosemide (LASIX) 20 MG tablet Take 1 tablet (20 mg total) by mouth daily.  . Glucosamine-Chondroitin 250-200 MG TABS Take by mouth.  . hydrocerin (EUCERIN) CREA Apply 1 application topically 2 (two) times daily.  . hydrOXYzine (ATARAX/VISTARIL) 10 MG tablet TAKE 2 & 1/2 TABLETS BY MOUTH 3 TIMES DAILY AS NEEDED FOR ITCHING  . Misc Natural Products (OSTEO BI-FLEX  JOINT SHIELD PO) Take 1 tablet by mouth 2 (two) times daily.  . mupirocin ointment (BACTROBAN) 2 % Apply to affected area bid  . omeprazole (PRILOSEC) 20 MG capsule Take 1 capsule by mouth  daily  . ramipril (ALTACE) 5 MG capsule Take 1 capsule by mouth  daily  . sertraline (ZOLOFT) 50 MG tablet Take 1 tablet by mouth  daily  . triamcinolone cream (KENALOG) 0.5 % Apply 1 application topically 2 (two) times daily.  . [DISCONTINUED] levofloxacin (LEVAQUIN) 250 MG tablet Take 1 tablet (250 mg total) by mouth daily. (Patient not taking: Reported on 08/01/2016)   No facility-administered encounter medications on file as of 08/01/2016.     Review of Systems  Constitutional: Negative for appetite change and unexpected weight change.  HENT: Negative for congestion and sinus pressure.   Respiratory: Negative for cough, chest tightness and shortness of breath.   Cardiovascular: Positive for leg swelling. Negative for chest pain and palpitations.  Gastrointestinal: Negative for abdominal pain, diarrhea, nausea and vomiting.  Genitourinary: Negative for difficulty urinating and dysuria.  Musculoskeletal: Negative for joint swelling and myalgias.  Skin: Negative for rash.  Neurological: Negative for dizziness, light-headedness and headaches.  Psychiatric/Behavioral: Negative for agitation and dysphoric mood.       Objective:    Physical Exam  Constitutional: She appears well-developed and well-nourished. No distress.  HENT:  Nose: Nose normal.  Mouth/Throat: Oropharynx is clear and moist.  Neck: Neck supple. No thyromegaly present.  Cardiovascular: Normal rate and regular rhythm.   Pulmonary/Chest: Breath sounds normal. No respiratory distress. She has no wheezes.  Abdominal: Soft. Bowel sounds are normal. There is no tenderness.  Musculoskeletal:  Increased pedal and lower extremity swelling.  Decreased erythema.  Still with stasis changes present.  Swelling improved.    Lymphadenopathy:     She has no cervical adenopathy.  Skin: No rash noted. There is erythema.  Psychiatric: She has a normal mood and affect. Her behavior is normal.    BP 140/78   Pulse 72   Temp 97.4 F (36.3 C) (Oral)   Resp 18   Wt 127 lb 4 oz (57.7 kg)   SpO2 97%   BMI 24.04 kg/m  Wt Readings from Last 3 Encounters:  08/01/16 127 lb 4 oz (57.7 kg)  06/24/16 127 lb (57.6 kg)  06/18/16 127 lb 3.2 oz (57.7 kg)     Lab Results  Component Value Date   WBC  6.9 06/11/2016   HGB 10.8 (L) 06/11/2016   HCT 32.7 (L) 06/11/2016   PLT 158.0 06/11/2016   GLUCOSE 93 06/18/2016   CHOL 229 (H) 07/12/2012   TRIG 87.0 07/12/2012   HDL 86.30 07/12/2012   LDLDIRECT 135.9 07/12/2012   ALT 10 06/11/2016   AST 19 06/11/2016   NA 136 06/18/2016   K 4.4 06/18/2016   CL 102 06/18/2016   CREATININE 1.07 06/18/2016   BUN 21 06/18/2016   CO2 25 06/18/2016   TSH 1.61 06/11/2016   INR 1.00 01/24/2015    US Venous Img Lower Unilateral Right  Result Date: 06/11/2016 CLINICAL DATA:  Right lower extremity edema, pain and erythema. History of prior DVT in the left lower extremity. EXAM: RIGHT LOWER EXTREMITY VENOUS DOPPLER ULTRASOUND TECHNIQUE: Gray-scale sonography with graded compression, as well as color Doppler and duplex ultrasound were performed to evaluate the lower extremity deep venous systems from the level of the common femoral vein and including the common femoral, femoral, profunda femoral, popliteal and calf veins including the posterior tibial, peroneal and gastrocnemius veins when visible. The superficial great saphenous vein was also interrogated. Spectral Doppler was utilized to evaluate flow at rest and with distal augmentation maneuvers in the common femoral, femoral and popliteal veins. COMPARISON:  None. FINDINGS: Contralateral Common Femoral Vein: Respiratory phasicity is normal and symmetric with the symptomatic side. No evidence of thrombus. Normal compressibility. Common Femoral Vein: No  evidence of thrombus. Normal compressibility, respiratory phasicity and response to augmentation. Saphenofemoral Junction: No evidence of thrombus. Normal compressibility and flow on color Doppler imaging. Profunda Femoral Vein: No evidence of thrombus. Normal compressibility and flow on color Doppler imaging. Femoral Vein: No evidence of thrombus. Normal compressibility, respiratory phasicity and response to augmentation. Popliteal Vein: No evidence of thrombus. Normal compressibility, respiratory phasicity and response to augmentation. Calf Veins: No evidence of thrombus. Normal compressibility and flow on color Doppler imaging. Superficial Great Saphenous Vein: No evidence of thrombus. Normal compressibility and flow on color Doppler imaging. Venous Reflux:  None. Other Findings: No evidence of superficial thrombophlebitis or abnormal fluid collection. IMPRESSION: No evidence of right lower extremity deep venous thrombosis. Electronically Signed   By: Aletta Edouard M.D.   On: 06/11/2016 13:55       Assessment & Plan:   Problem List Items Addressed This Visit    Anemia    Had declined further w/up.  Follow cbc.        Hypercholesterolemia    Follow lipid panel.        Hypertension    Blood pressure on recheck improved.  Follow pressures.  Follow metabolic panel.        Renal insufficiency    Creatinine improved last check - 1.07.  Follow.        Weight loss    Weight stable from previous checks.  Follow.           [name], MD

## 2016-08-01 NOTE — Progress Notes (Signed)
Pre visit review using our clinic review tool, if applicable. No additional management support is needed unless otherwise documented below in the visit note. 

## 2016-08-03 ENCOUNTER — Encounter: Payer: Self-pay | Admitting: Internal Medicine

## 2016-08-03 NOTE — Assessment & Plan Note (Signed)
Blood pressure on recheck improved.  Follow pressures.  Follow metabolic panel.  

## 2016-08-03 NOTE — Assessment & Plan Note (Signed)
Had declined further w/up.  Follow cbc.

## 2016-08-03 NOTE — Assessment & Plan Note (Signed)
Creatinine improved last check - 1.07.  Follow.

## 2016-08-03 NOTE — Assessment & Plan Note (Signed)
Follow lipid panel.   

## 2016-08-03 NOTE — Assessment & Plan Note (Signed)
Weight stable from previous checks.  Follow.

## 2016-08-05 DIAGNOSIS — M79675 Pain in left toe(s): Secondary | ICD-10-CM | POA: Diagnosis not present

## 2016-08-05 DIAGNOSIS — M79674 Pain in right toe(s): Secondary | ICD-10-CM | POA: Diagnosis not present

## 2016-08-05 DIAGNOSIS — B351 Tinea unguium: Secondary | ICD-10-CM | POA: Diagnosis not present

## 2016-08-22 ENCOUNTER — Encounter (INDEPENDENT_AMBULATORY_CARE_PROVIDER_SITE_OTHER): Payer: Self-pay | Admitting: Vascular Surgery

## 2016-08-22 ENCOUNTER — Ambulatory Visit (INDEPENDENT_AMBULATORY_CARE_PROVIDER_SITE_OTHER): Payer: Medicare Other

## 2016-08-22 ENCOUNTER — Ambulatory Visit (INDEPENDENT_AMBULATORY_CARE_PROVIDER_SITE_OTHER): Payer: Medicare Other | Admitting: Vascular Surgery

## 2016-08-22 VITALS — BP 137/67 | HR 71 | Resp 16 | Ht 61.0 in | Wt 123.0 lb

## 2016-08-22 DIAGNOSIS — I87009 Postthrombotic syndrome without complications of unspecified extremity: Secondary | ICD-10-CM | POA: Diagnosis not present

## 2016-08-22 DIAGNOSIS — I10 Essential (primary) hypertension: Secondary | ICD-10-CM

## 2016-08-22 DIAGNOSIS — M7989 Other specified soft tissue disorders: Secondary | ICD-10-CM | POA: Diagnosis not present

## 2016-08-22 DIAGNOSIS — N289 Disorder of kidney and ureter, unspecified: Secondary | ICD-10-CM | POA: Diagnosis not present

## 2016-08-22 NOTE — Progress Notes (Signed)
MRN : [id number]  Kayla Galloway is a 83 y.o. (03/28/1921) female who presents with chief complaint of  Chief Complaint  Patient presents with  . Follow-up  .  History of Present Illness: Patient returns today in follow up of Leg swelling and pain. Since her last visit, her left leg is actually swelling more than her right leg. She has not been wearing her compression stockings or elevating her legs all that much. Her right lower extremity reflux study today is negative for DVT, superficial thrombophlebitis, or significant venous reflux in the right leg.       Past Medical History:  Diagnosis Date  . Anemia   . Arthritis   . Cancer ([place])    skin ca lesion of scalp- removed  . Depression   . DVT (deep venous thrombosis), left    bilateral, IVC filter 2010  . GERD (gastroesophageal reflux disease)   . Gout   . Hypercholesterolemia   . Hyperkalemia   . Hypertension    Dr. [name]  . Nephrolithiasis   . Obesity   . Osteoporosis   . Peripheral vascular disease ([place])   . 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 Spider veins          Past Surgical History:  Procedure Laterality Date  . CARDIAC CATHETERIZATION     2010 Does not see a cardiac  doctor  . Colonsopy    . DENTAL SURGERY    . ECTOPIC PREGNANCY SURGERY    . EYE SURGERY Bilateral 2011,2012   cataracts  . FRACTURE SURGERY  2009   hip, rod from knee to hip  . HEMORRHOID SURGERY    . IVC filter Left    pt states it has turned side ways and could not be removed.  Marland Kitchen KYPHOPLASTY  09/03/2012   per patient, she has had kypho x 2:  Surgeon: Winfield Cunas, MD;  Location: [place] [facility];  Service: Neurosurgery;  Laterality: N/A;  Thoracic eight Kyphoplasty  . OPEN REDUCTION INTERNAL FIXATION (ORIF) DISTAL RADIAL FRACTURE Left 09/06/2015   Procedure: OPEN REDUCTION  INTERNAL FIXATION (ORIF) DISTAL RADIAL FRACTURE;  Surgeon: Hessie Knows, MD;  Location: [facility];  Service: Orthopedics;  Laterality: Left;  . RIGHT OOPHORECTOMY     partial-  . SKIN CANCER EXCISION     top of head  . TONSILLECTOMY     age 89  . VERTEBROPLASTY  12/31/2011   Procedure: VERTEBROPLASTY;  Surgeon: Winfield Cunas, MD;  Location: [place] [facility];  Service: Neurosurgery;  Laterality: N/A;  Thoracic eleven vertebroplasty          Family History  Problem Relation Age of Onset  . Heart disease Father     myocardial infarction  . Heart disease Brother     myocardial infarction  . Lymphoma Sister   . Anesthesia problems Neg Hx   . Breast cancer Neg Hx   . Colon cancer Neg Hx      Social History     Social History  Substance Use Topics  . Smoking status: Never Smoker  . Smokeless tobacco: Never Used  . Alcohol use No  Still lives independently. No IV drug use.       Allergies  Allergen Reactions  . Cefuroxime Axetil Rash    Pt states that she does fine with Keflex.    . Doxycycline  Nausea Only and Other (See Comments)    Reaction:  Weight loss   . Advil [Ibuprofen] Swelling  . Celebrex [Celecoxib] Nausea Only  . Daypro [Oxaprozin] Other (See Comments)    Reaction:  Dizziness   . Etodolac Nausea Only  . Macrobid [employer] Macro] Other (See Comments)    Reaction:  Burning   . Penicillins Other (See Comments)    Reaction:  Burning   . Percocet [Oxycodone-Acetaminophen] Nausea Only and Swelling  . Tramadol Other (See Comments)    Reaction:  Unknown   . Valium [Diazepam] Other (See Comments)    Reaction:  Dizziness   . Neosporin [Neomycin-Bacitracin Zn-Polymyx] Rash  . Vicodin [Hydrocodone-Acetaminophen] Nausea Only          Current Outpatient Prescriptions  Medication Sig Dispense Refill  . acetaminophen (TYLENOL) 650 MG CR tablet Take 650 mg by mouth every 8 (eight) hours as needed for pain.     Marland Kitchen  apixaban (ELIQUIS) 5 MG TABS tablet Take 1 tablet (5 mg total) by mouth 2 (two) times daily. 60 tablet 0  . bifidobacterium infantis (ALIGN) capsule Take 1 capsule by mouth daily. 30 capsule 0  . Calcium Carb-Cholecalciferol (CALCIUM 600 + D PO) Take 1 tablet by mouth 3 (three) times daily.    . furosemide (LASIX) 20 MG tablet Take 1 tablet (20 mg total) by mouth daily. 90 tablet 2  . Glucosamine-Chondroitin 250-200 MG TABS Take by mouth.    . hydrocerin (EUCERIN) CREA Apply 1 application topically 2 (two) times daily. 113 g 1  . hydrOXYzine (ATARAX/VISTARIL) 10 MG tablet TAKE 2 & 1/2 TABLETS BY MOUTH 3 TIMES DAILY AS NEEDED FOR ITCHING 30 tablet 0  . levofloxacin (LEVAQUIN) 250 MG tablet Take 1 tablet (250 mg total) by mouth daily. 5 tablet 0  . Misc Natural Products (OSTEO BI-FLEX JOINT SHIELD PO) Take 1 tablet by mouth 2 (two) times daily.    . mupirocin ointment (BACTROBAN) 2 % Apply to affected area bid 22 g 0  . omeprazole (PRILOSEC) 20 MG capsule Take 1 capsule by mouth  daily 90 capsule 2  . ramipril (ALTACE) 5 MG capsule Take 1 capsule by mouth  daily 90 capsule 3  . sertraline (ZOLOFT) 50 MG tablet Take 1 tablet by mouth  daily 90 tablet 0  . triamcinolone cream (KENALOG) 0.5 % Apply 1 application topically 2 (two) times daily. 30 g 1   No current facility-administered medications for this visit.       REVIEW OF SYSTEMS (Negative unless checked)  Constitutional: [] Weight loss  [] Fever  [] Chills Cardiac: [] Chest pain   [] Chest pressure   [] Palpitations   [] Shortness of breath when laying flat   [] Shortness of breath at rest   [] Shortness of breath with exertion. Vascular:  [] Pain in legs with walking   [] Pain in legs at rest   [] Pain in legs when laying flat   [] Claudication   [] Pain in feet when walking  [] Pain in feet at rest  [] Pain in feet when laying flat   [x] History of DVT   [] Phlebitis   [x] Swelling in legs   [] Varicose veins   [] Non-healing ulcers Pulmonary:    [] Uses home oxygen   [] Productive cough   [] Hemoptysis   [] Wheeze  [] COPD   [] Asthma Neurologic:  [] Dizziness  [] Blackouts   [] Seizures   [] History of stroke   [] History of TIA  [] Aphasia   [] Temporary blindness   [] Dysphagia   [] Weakness or numbness in arms   [] Weakness  or numbness in legs Musculoskeletal:  [x] Arthritis   [] Joint swelling   [] Joint pain   [] Low back pain Hematologic:  [] Easy bruising  [] Easy bleeding   [] Hypercoagulable state   [] Anemic  [] Hepatitis Gastrointestinal:  [] Blood in stool   [] Vomiting blood  [] Gastroesophageal reflux/heartburn   [] Abdominal pain Genitourinary:  [] Chronic kidney disease   [] Difficult urination  [] Frequent urination  [] Burning with urination   [] Hematuria Skin:  [] Rashes   [] Ulcers   [] Wounds Psychological:  [] History of anxiety   []  History of major depression.    Physical Exam BP (!) 167/75 (BP Location: Left Arm)   Pulse 79   Resp 16   Ht 5\' 1"  (1.549 m)   Wt 127 lb (57.6 kg)   BMI 24.00 kg/m  Gen:  WD/WN, NAD. Appears younger than stated age Head: [place]/AT, No temporalis wasting. Prominent temp pulse not noted. Ear/Nose/Throat: Hearing grossly intact, nares w/o erythema or drainage, oropharynx w/o Erythema/Exudate Eyes: Conjunctiva clear, sclera non-icteric  Neck: trachea midline.  No JVD.  Pulmonary:  Good air movement, no labored respirations, equal bilaterally.  Cardiac: RRR, normal S1, S2 Vascular:  Vessel Right Left  Radial Palpable Palpable  Ulnar Palpable Palpable  Brachial Palpable Palpable  Carotid Palpable, without bruit Palpable, without bruit  Aorta Not palpable N/A  Femoral Palpable Palpable  Popliteal Palpable Palpable  PT Not Palpable Not Palpable  DP Palpable Palpable   Gastrointestinal: soft, non-tender/non-distended. No guarding/reflex. No masses, surgical incisions, or scars. Musculoskeletal: M/S 5/5 throughout.  Extremities without ischemic changes.  No deformity or atrophy. 2+ right lower extremity edema  and 3+ left lower extremity edema. Diffuse varicosities throughout bilateral lower legs with worse stasis changes on the left than the right Neurologic: Sensation grossly intact in extremities.  Symmetrical.  Speech is fluent. Motor exam as listed above. Psychiatric: Judgment intact, Mood & affect appropriate for pt's clinical situation. Dermatologic: No rashes or ulcers noted.  No cellulitis or open wounds. Lymph : No Cervical, Axillary, or Inguinal lymphadenopathy.     Labs Recent Results (from the past 2160 hour(s))  Comprehensive metabolic panel     Status: Abnormal   Collection Time: 06/11/16 11:44 AM  Result Value Ref Range   Sodium 136 135 - 145 mEq/L   Potassium 4.1 3.5 - 5.1 mEq/L   Chloride 102 96 - 112 mEq/L   CO2 25 19 - 32 mEq/L   Glucose, Bld 93 70 - 99 mg/dL   BUN 27 (H) 6 - 23 mg/dL   Creatinine, Ser 1.40 (H) 0.40 - 1.20 mg/dL   Total Bilirubin 0.5 0.2 - 1.2 mg/dL   Alkaline Phosphatase 49 39 - 117 U/L   AST 19 0 - 37 U/L   ALT 10 0 - 35 U/L   Total Protein 6.7 6.0 - 8.3 g/dL   Albumin 4.2 3.5 - 5.2 g/dL   Calcium 9.3 8.4 - 10.5 mg/dL   GFR 37.30 (L) >60.00 mL/min  CBC with Differential/Platelet     Status: Abnormal   Collection Time: 06/11/16 11:44 AM  Result Value Ref Range   WBC 6.9 4.0 - 10.5 K/uL   RBC 3.41 (L) 3.87 - 5.11 Mil/uL   Hemoglobin 10.8 (L) 12.0 - 15.0 g/dL   HCT 32.7 (L) 36.0 - 46.0 %   MCV 95.6 78.0 - 100.0 fl   MCHC 33.2 30.0 - 36.0 g/dL   RDW 14.9 11.5 - 15.5 %   Platelets 158.0 150.0 - 400.0 K/uL   Neutrophils Relative % 63.7 43.0 -  77.0 %   Lymphocytes Relative 22.0 12.0 - 46.0 %   Monocytes Relative 11.1 3.0 - 12.0 %   Eosinophils Relative 3.0 0.0 - 5.0 %   Basophils Relative 0.2 0.0 - 3.0 %   Neutro Abs 4.4 1.4 - 7.7 K/uL   Lymphs Abs 1.5 0.7 - 4.0 K/uL   Monocytes Absolute 0.8 0.1 - 1.0 K/uL   Eosinophils Absolute 0.2 0.0 - 0.7 K/uL   Basophils Absolute 0.0 0.0 - 0.1 K/uL  TSH     Status: None   Collection Time: 06/11/16  11:44 AM  Result Value Ref Range   TSH 1.61 0.35 - 4.50 uIU/mL  Urinalysis, Routine w reflex microscopic (not at [facility])     Status: Abnormal   Collection Time: 06/18/16 10:40 AM  Result Value Ref Range   Color, Urine YELLOW Yellow;Lt. Yellow   APPearance SL CLOUDY (A) Clear   Specific Gravity, Urine 1.010 1.000 - 1.030   pH 7.0 5.0 - 8.0   Total Protein, Urine NEGATIVE Negative   Urine Glucose NEGATIVE Negative   Ketones, ur NEGATIVE Negative   Bilirubin Urine NEGATIVE Negative   Hgb urine dipstick NEGATIVE Negative   Urobilinogen, UA 0.2 0.0 - 1.0   Leukocytes, UA SMALL (A) Negative   Nitrite POSITIVE (A) Negative   WBC, UA TNTC(>50/hpf) (A) 0-2/hpf   RBC / HPF 0-2/hpf 0-2/hpf   Squamous Epithelial / LPF Few(5-10/hpf) (A) Rare(0-4/hpf)   Bacteria, UA Many(>50/hpf) (A) None  Basic metabolic panel     Status: Abnormal   Collection Time: 06/18/16 10:40 AM  Result Value Ref Range   Sodium 136 135 - 145 mEq/L   Potassium 4.4 3.5 - 5.1 mEq/L   Chloride 102 96 - 112 mEq/L   CO2 25 19 - 32 mEq/L   Glucose, Bld 93 70 - 99 mg/dL   BUN 21 6 - 23 mg/dL   Creatinine, Ser 1.07 0.40 - 1.20 mg/dL   Calcium 9.5 8.4 - 10.5 mg/dL   GFR 50.86 (L) >60.00 mL/min  CULTURE, URINE COMPREHENSIVE     Status: None   Collection Time: 06/19/16  8:53 AM  Result Value Ref Range   Culture PROTEUS MIRABILIS     Comment: This organism may show imipenem resistance by mechanisms other than a carbapenemase.    Colony Count Greater than 100,000 CFU/mL    Organism ID, Bacteria PROTEUS MIRABILIS       Susceptibility   Proteus mirabilis -  (no method available)    AMPICILLIN <=2 Sensitive     AMOX/CLAVULANIC <=2 Sensitive     AMPICILLIN/SULBACTAM <=2 Sensitive     PIP/TAZO <=4 Sensitive     IMIPENEM 8 Resistant     CEFAZOLIN  Sensitive     CEFTRIAXONE <=1 Sensitive     CEFTAZIDIME <=1 Sensitive     CEFEPIME <=1 Sensitive     GENTAMICIN <=1 Sensitive     TOBRAMYCIN <=1 Sensitive     CIPROFLOXACIN <=0.25  Sensitive     LEVOFLOXACIN <=0.12 Sensitive     NITROFURANTOIN 128 Resistant     TRIMETH/SULFA* <=20 Sensitive      * NR=NOT REPORTABLE,SEE COMMENTORAL therapy:A cefazolin MIC of <32 predicts susceptibility to the oral agents cefaclor,cefdinir,cefpodoxime,cefprozil,cefuroxime,cephalexin,and loracarbef when used for therapy of uncomplicated UTIs due to E.coli,K.pneumomiae,and P.mirabilis. PARENTERAL therapy: A cefazolinMIC of >8 indicates resistance to parenteralcefazolin. An alternate test method must beperformed to confirm susceptibility to parenteralcefazolin.    Radiology No results found.    Assessment/Plan  Hypertension blood pressure control  important in reducing the progression of atherosclerotic disease. On appropriate oral medications.   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 Her leg is more swollen today.  I have strongly recommended that she wear her stockings daily.  I have offered her [employer] today and she has declined.  I will see her back in 3-4 months.    Renal insufficiency Likely contributing to leg swelling as well.   Swelling of limb Her right lower extremity reflux study today is negative for DVT, superficial thrombophlebitis, or significant venous reflux in the right leg. Her leg is more swollen today.  I have strongly recommended that she wear her stockings daily.  I have offered her [employer] today and she has declined.  I will see her back in 3-4 months.      Leotis Pain, MD  08/22/2016 3:58 PM    This note was created with Dragon medical transcription system.  Any errors from dictation are purely unintentional

## 2016-08-22 NOTE — Assessment & Plan Note (Signed)
Her right lower extremity reflux study today is negative for DVT, superficial thrombophlebitis, or significant venous reflux in the right leg. Her leg is more swollen today.  I have strongly recommended that she wear her stockings daily.  I have offered her [employer] today and she has declined.  I will see her back in 3-4 months.

## 2016-08-22 NOTE — Assessment & Plan Note (Signed)
blood pressure control important in reducing the progression of atherosclerotic disease. On appropriate oral medications.  

## 2016-08-22 NOTE — Assessment & Plan Note (Signed)
Likely contributing to leg swelling as well.

## 2016-08-28 ENCOUNTER — Other Ambulatory Visit: Payer: Self-pay | Admitting: Internal Medicine

## 2016-09-12 ENCOUNTER — Telehealth: Payer: Self-pay | Admitting: *Deleted

## 2016-09-12 NOTE — Telephone Encounter (Signed)
Pt sister stated that she missed a call, she was not sure what it could have been about Sister [employer] [phone]

## 2016-09-17 NOTE — Telephone Encounter (Signed)
There is no record of [place] calling her. It must be an Old message. Closing note due to being a mistake.

## 2016-09-26 ENCOUNTER — Other Ambulatory Visit: Payer: Self-pay | Admitting: Internal Medicine

## 2016-09-29 NOTE — Telephone Encounter (Signed)
Okay to continue refilling this medication? Pt was given 30 tablets last month with 2 refills & requesting another refill. It also looks like she didn't receive enough tablets based on the directions.

## 2016-09-29 NOTE — Telephone Encounter (Signed)
Vm full

## 2016-09-29 NOTE — Telephone Encounter (Signed)
Please clarify with pt or her sister - why pt is needing this so frequently.  This is usually not a medication to stay on.  Does she need this dose tid every day?

## 2016-10-01 NOTE — Telephone Encounter (Signed)
Lm to call office

## 2016-10-07 NOTE — Telephone Encounter (Signed)
Pt sister called in regards to this. Please advise, thank you!  Call [place] @ [phone]

## 2016-10-07 NOTE — Telephone Encounter (Signed)
Vm full

## 2016-10-08 NOTE — Telephone Encounter (Signed)
Pharmacist called in regards to this medication. Stated that they had questions in regards to how it is being taken and that we will call it when when straightened out.

## 2016-10-09 NOTE — Telephone Encounter (Signed)
Pt sister stopped by office she states her sister is taking 30 pills in about 5 days. She states that patient c/o dry skin all the time and she will pick at it until sore forms. She states that patient thinks that she needs to take all the time. She is putting lotions and baby oil on but still complain of "itchin and dry skin"

## 2016-10-09 NOTE — Telephone Encounter (Signed)
lmtrc

## 2016-10-10 NOTE — Telephone Encounter (Signed)
lmtrc

## 2016-10-10 NOTE — Telephone Encounter (Signed)
Do not recommend continuing this amount of medication.  If having increased dry skin and itching to this extent, needs to be evaluated.  Schedule appt for evaluation.  If urgent, schedule acute visit - they can f/u with me.

## 2016-10-10 NOTE — Telephone Encounter (Signed)
Pt sister called back she will check with sister she has app on 3-20 and see if she wants to move up. She does not feel it is any worse but she will call if sister would like to be seen any sooner for this app.

## 2016-11-04 ENCOUNTER — Encounter: Payer: Self-pay | Admitting: Internal Medicine

## 2016-11-04 ENCOUNTER — Ambulatory Visit (INDEPENDENT_AMBULATORY_CARE_PROVIDER_SITE_OTHER): Payer: Medicare Other | Admitting: Internal Medicine

## 2016-11-04 VITALS — BP 130/60 | HR 62 | Temp 97.5°F | Resp 16 | Ht 61.0 in | Wt 131.0 lb

## 2016-11-04 DIAGNOSIS — E7800 Pure hypercholesterolemia, unspecified: Secondary | ICD-10-CM | POA: Diagnosis not present

## 2016-11-04 DIAGNOSIS — L989 Disorder of the skin and subcutaneous tissue, unspecified: Secondary | ICD-10-CM | POA: Diagnosis not present

## 2016-11-04 DIAGNOSIS — L97209 Non-pressure chronic ulcer of unspecified calf with unspecified severity: Secondary | ICD-10-CM | POA: Diagnosis not present

## 2016-11-04 DIAGNOSIS — D649 Anemia, unspecified: Secondary | ICD-10-CM | POA: Diagnosis not present

## 2016-11-04 DIAGNOSIS — R600 Localized edema: Secondary | ICD-10-CM | POA: Insufficient documentation

## 2016-11-04 DIAGNOSIS — I872 Venous insufficiency (chronic) (peripheral): Secondary | ICD-10-CM

## 2016-11-04 DIAGNOSIS — I10 Essential (primary) hypertension: Secondary | ICD-10-CM | POA: Diagnosis not present

## 2016-11-04 LAB — HEPATIC FUNCTION PANEL
ALBUMIN: 4.3 g/dL (ref 3.5–5.2)
ALT: 12 U/L (ref 0–35)
AST: 23 U/L (ref 0–37)
Alkaline Phosphatase: 61 U/L (ref 39–117)
BILIRUBIN TOTAL: 0.4 mg/dL (ref 0.2–1.2)
Bilirubin, Direct: 0.1 mg/dL (ref 0.0–0.3)
TOTAL PROTEIN: 7.3 g/dL (ref 6.0–8.3)

## 2016-11-04 LAB — CBC WITH DIFFERENTIAL/PLATELET
BASOS PCT: 0.5 % (ref 0.0–3.0)
Basophils Absolute: 0 10*3/uL (ref 0.0–0.1)
EOS PCT: 2.5 % (ref 0.0–5.0)
Eosinophils Absolute: 0.2 10*3/uL (ref 0.0–0.7)
HCT: 34.3 % — ABNORMAL LOW (ref 36.0–46.0)
HEMOGLOBIN: 11.2 g/dL — AB (ref 12.0–15.0)
LYMPHS ABS: 1.5 10*3/uL (ref 0.7–4.0)
Lymphocytes Relative: 22.6 % (ref 12.0–46.0)
MCHC: 32.8 g/dL (ref 30.0–36.0)
MCV: 97.7 fl (ref 78.0–100.0)
MONO ABS: 0.7 10*3/uL (ref 0.1–1.0)
MONOS PCT: 11.5 % (ref 3.0–12.0)
Neutro Abs: 4.1 10*3/uL (ref 1.4–7.7)
Neutrophils Relative %: 62.9 % (ref 43.0–77.0)
Platelets: 188 10*3/uL (ref 150.0–400.0)
RBC: 3.51 Mil/uL — ABNORMAL LOW (ref 3.87–5.11)
RDW: 13.9 % (ref 11.5–15.5)
WBC: 6.5 10*3/uL (ref 4.0–10.5)

## 2016-11-04 LAB — BASIC METABOLIC PANEL
BUN: 31 mg/dL — ABNORMAL HIGH (ref 6–23)
CO2: 27 mEq/L (ref 19–32)
Calcium: 10 mg/dL (ref 8.4–10.5)
Chloride: 100 mEq/L (ref 96–112)
Creatinine, Ser: 1.18 mg/dL (ref 0.40–1.20)
GFR: 45.39 mL/min — AB (ref >60.00)
GLUCOSE: 97 mg/dL (ref 70–99)
POTASSIUM: 4 meq/L (ref 3.5–5.1)
SODIUM: 137 meq/L (ref 135–145)

## 2016-11-04 MED ORDER — TRIAMCINOLONE ACETONIDE 0.5 % EX CREA: 1.0000 "application " | TOPICAL_CREAM | Freq: Two times a day (BID) | CUTANEOUS | 1 refills | Status: DC

## 2016-11-04 NOTE — Progress Notes (Signed)
Pre-visit discussion using our clinic review tool. No additional management support is needed unless otherwise documented below in the visit note.  

## 2016-11-04 NOTE — Progress Notes (Signed)
Patient ID: Kayla Galloway, female   DOB: 01-24-1921, 83 y.o.   MRN: [id number]   Subjective:    Patient ID: Kayla Galloway, female    DOB: 12-17-1920, 83 y.o.   MRN: [id number]  HPI  Patient here for a scheduled follow up.  She is accompanied by her sister.  History obtained from both of them.  She reports she is doing relatively well.  Still having issues with lower extremity swelling.  Sister states she sits with legs minimally elevated.  Will have days where swelling is better than other days.  She scratches and picks at her skin and skin lesions.  Was taking an increased amount of atarax.  Eating well.  Breathing stable.  No chest pain.  No nausea or vomiting.  Bowels moving.     Past Medical History:  Diagnosis Date  . Anemia   . Arthritis   . Cancer ([place])    skin ca lesion of scalp- removed  . Depression   . DVT (deep venous thrombosis), left    bilateral, IVC filter 2010  . GERD (gastroesophageal reflux disease)   . Gout   . Hypercholesterolemia   . Hyperkalemia   . Hypertension    Dr. [name]  . Nephrolithiasis   . Obesity   . Osteoporosis   . Peripheral vascular disease ([place])   . 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 Spider veins    Past Surgical History:  Procedure Laterality Date  . CARDIAC CATHETERIZATION     2010 Does not see a cardiac  doctor  . Colonsopy    . DENTAL SURGERY    . ECTOPIC PREGNANCY SURGERY    . EYE SURGERY Bilateral 2011,2012   cataracts  . FRACTURE SURGERY  2009   hip, rod from knee to hip  . HEMORRHOID SURGERY    . IVC filter Left    pt states it has turned side ways and could not be removed.  Marland Kitchen KYPHOPLASTY  09/03/2012   per patient, she has had kypho x 2:  Surgeon: Winfield Cunas, MD;  Location: [facility];  Service: Neurosurgery;  Laterality: N/A;  Thoracic eight Kyphoplasty  . OPEN REDUCTION INTERNAL FIXATION (ORIF)  DISTAL RADIAL FRACTURE Left 09/06/2015   Procedure: OPEN REDUCTION INTERNAL FIXATION (ORIF) DISTAL RADIAL FRACTURE;  Surgeon: Hessie Knows, MD;  Location: [facility] ORS;  Service: Orthopedics;  Laterality: Left;  . RIGHT OOPHORECTOMY     partial-  . SKIN CANCER EXCISION     top of head  . TONSILLECTOMY     age 14  . VERTEBROPLASTY  12/31/2011   Procedure: VERTEBROPLASTY;  Surgeon: Winfield Cunas, MD;  Location: [facility];  Service: Neurosurgery;  Laterality: N/A;  Thoracic eleven vertebroplasty   Family History  Problem Relation Age of Onset  . Heart disease Father     myocardial infarction  . Heart disease Brother     myocardial infarction  . Lymphoma Sister   . Anesthesia problems Neg Hx   . Breast cancer Neg Hx   . Colon cancer Neg Hx    Social History   Social History  . Marital status: Widowed    Spouse name: N/A  . Number of children: 0  . Years of education: N/A   Social History Main Topics  . Smoking status: Never Smoker  . Smokeless tobacco: Never Used  .  Alcohol use No  . Drug use: No  . Sexual activity: Not Asked   Other Topics Concern  . None   Social History Narrative  . None    Outpatient Encounter Prescriptions as of 11/04/2016  Medication Sig  . acetaminophen (TYLENOL) 650 MG CR tablet Take 650 mg by mouth every 8 (eight) hours as needed for pain.   Marland Kitchen apixaban (ELIQUIS) 5 MG TABS tablet Take 1 tablet (5 mg total) by mouth 2 (two) times daily.  . bifidobacterium infantis (ALIGN) capsule Take 1 capsule by mouth daily.  . Calcium Carb-Cholecalciferol (CALCIUM 600 + D PO) Take 1 tablet by mouth 3 (three) times daily.  . furosemide (LASIX) 20 MG tablet Take 1 tablet (20 mg total) by mouth daily.  . Glucosamine-Chondroitin 250-200 MG TABS Take by mouth.  . hydrocerin (EUCERIN) CREA Apply 1 application topically 2 (two) times daily.  . hydrOXYzine (ATARAX/VISTARIL) 10 MG tablet TAKE 2 & 1/2 TABLETS BY MOUTH 3 TIMES DAILY AS NEEDED ITCHING  . Misc Natural  Products (OSTEO BI-FLEX JOINT SHIELD PO) Take 1 tablet by mouth 2 (two) times daily.  . mupirocin ointment (BACTROBAN) 2 % Apply to affected area bid  . omeprazole (PRILOSEC) 20 MG capsule Take 1 capsule by mouth  daily  . ramipril (ALTACE) 5 MG capsule Take 1 capsule by mouth  daily  . sertraline (ZOLOFT) 50 MG tablet Take 1 tablet by mouth  daily  . triamcinolone cream (KENALOG) 0.5 % Apply 1 application topically 2 (two) times daily.  . [DISCONTINUED] triamcinolone cream (KENALOG) 0.5 % Apply 1 application topically 2 (two) times daily.   No facility-administered encounter medications on file as of 11/04/2016.     Review of Systems  Constitutional: Negative for appetite change and unexpected weight change.  HENT: Negative for congestion and sinus pressure.   Respiratory: Negative for cough, chest tightness and shortness of breath.   Cardiovascular: Positive for leg swelling. Negative for chest pain and palpitations.  Gastrointestinal: Negative for abdominal pain, diarrhea, nausea and vomiting.  Genitourinary: Negative for difficulty urinating and dysuria.  Musculoskeletal: Negative for myalgias and neck pain.  Skin:       Erythema - lower extremities.  Small lesions lower extremities.    Neurological: Negative for dizziness, light-headedness and headaches.  Psychiatric/Behavioral: Negative for agitation and dysphoric mood.       Objective:    Physical Exam  Constitutional: She appears well-developed and well-nourished. No distress.  HENT:  Nose: Nose normal.  Mouth/Throat: Oropharynx is clear and moist.  Neck: Neck supple. No thyromegaly present.  Cardiovascular: Normal rate and regular rhythm.   Pulmonary/Chest: Breath sounds normal. No respiratory distress. She has no wheezes.  Abdominal: Soft. Bowel sounds are normal. There is no tenderness.  Musculoskeletal: She exhibits edema and tenderness.  Lymphadenopathy:    She has no cervical adenopathy.  Skin: Rash noted. There  is erythema.  Psychiatric: She has a normal mood and affect. Her behavior is normal.    BP 130/60 (BP Location: Left Arm, Patient Position: Sitting, Cuff Size: Normal)   Pulse 62   Temp 97.5 F (36.4 C) (Oral)   Resp 16   Ht 5\' 1"  (1.549 m)   Wt 131 lb (59.4 kg)   SpO2 99%   BMI 24.75 kg/m  Wt Readings from Last 3 Encounters:  11/04/16 131 lb (59.4 kg)  08/22/16 123 lb (55.8 kg)  08/01/16 127 lb 4 oz (57.7 kg)     Lab Results  Component Value Date  WBC 6.5 11/04/2016   HGB 11.2 (L) 11/04/2016   HCT 34.3 (L) 11/04/2016   PLT 188.0 11/04/2016   GLUCOSE 97 11/04/2016   CHOL 229 (H) 07/12/2012   TRIG 87.0 07/12/2012   HDL 86.30 07/12/2012   LDLDIRECT 135.9 07/12/2012   ALT 12 11/04/2016   AST 23 11/04/2016   NA 137 11/04/2016   K 4.0 11/04/2016   CL 100 11/04/2016   CREATININE 1.18 11/04/2016   BUN 31 (H) 11/04/2016   CO2 27 11/04/2016   TSH 1.61 06/11/2016   INR 1.00 01/24/2015    US Venous Img Lower Unilateral Right  Result Date: 06/11/2016 CLINICAL DATA:  Right lower extremity edema, pain and erythema. History of prior DVT in the left lower extremity. EXAM: RIGHT LOWER EXTREMITY VENOUS DOPPLER ULTRASOUND TECHNIQUE: Gray-scale sonography with graded compression, as well as color Doppler and duplex ultrasound were performed to evaluate the lower extremity deep venous systems from the level of the common femoral vein and including the common femoral, femoral, profunda femoral, popliteal and calf veins including the posterior tibial, peroneal and gastrocnemius veins when visible. The superficial great saphenous vein was also interrogated. Spectral Doppler was utilized to evaluate flow at rest and with distal augmentation maneuvers in the common femoral, femoral and popliteal veins. COMPARISON:  None. FINDINGS: Contralateral Common Femoral Vein: Respiratory phasicity is normal and symmetric with the symptomatic side. No evidence of thrombus. Normal compressibility. Common  Femoral Vein: No evidence of thrombus. Normal compressibility, respiratory phasicity and response to augmentation. Saphenofemoral Junction: No evidence of thrombus. Normal compressibility and flow on color Doppler imaging. Profunda Femoral Vein: No evidence of thrombus. Normal compressibility and flow on color Doppler imaging. Femoral Vein: No evidence of thrombus. Normal compressibility, respiratory phasicity and response to augmentation. Popliteal Vein: No evidence of thrombus. Normal compressibility, respiratory phasicity and response to augmentation. Calf Veins: No evidence of thrombus. Normal compressibility and flow on color Doppler imaging. Superficial Great Saphenous Vein: No evidence of thrombus. Normal compressibility and flow on color Doppler imaging. Venous Reflux:  None. Other Findings: No evidence of superficial thrombophlebitis or abnormal fluid collection. IMPRESSION: No evidence of right lower extremity deep venous thrombosis. Electronically Signed   By: Aletta Edouard M.D.   On: 06/11/2016 13:55       Assessment & Plan:   Problem List Items Addressed This Visit    Anemia    Has declined further w/up.  Recheck cbc.        Relevant Orders   CBC with Differential/Platelet (Completed)   Bilateral lower extremity edema - Primary    Persistent lower extremity swelling.  Varies.  Some days better than others.  Increased swelling overall.  Will have vascular surgery evaluate for help with her swelling.  Leg elevation.  Take an extra lasix for next two days.  Took an extra yesterday.  Check labs.        Relevant Orders   Hepatic function panel (Completed)   Ambulatory referral to Vascular Surgery   Hypercholesterolemia    Follow lipid panel.        Hypertension    Blood pressure under good control.  Continue same medication regimen.  Follow pressures.  Follow metabolic panel.        Relevant Orders   Basic metabolic panel (Completed)   Skin lesions    Persistent with skin  changes as outlined.  She continues to pick lesions and scratch.  Have dermatology evaluate.       Relevant Orders  Ambulatory referral to Dermatology   Stasis ulcer ([place])    Previous ulcer.  Evaluated at wound clinic and Dr Ola Spurr.  With persistent increased swelling and stasis changes.  She has also been scratching and picking skin lesions.  Will have dermatology reevaluate and have vascular reevaluate to help with swelling.            [name], MD

## 2016-11-09 ENCOUNTER — Encounter: Payer: Self-pay | Admitting: Internal Medicine

## 2016-11-09 DIAGNOSIS — L989 Disorder of the skin and subcutaneous tissue, unspecified: Secondary | ICD-10-CM | POA: Insufficient documentation

## 2016-11-09 NOTE — Assessment & Plan Note (Signed)
Has declined further w/up.  Recheck cbc.

## 2016-11-09 NOTE — Assessment & Plan Note (Signed)
Persistent with skin changes as outlined.  She continues to pick lesions and scratch.  Have dermatology evaluate.

## 2016-11-09 NOTE — Assessment & Plan Note (Signed)
Persistent lower extremity swelling.  Varies.  Some days better than others.  Increased swelling overall.  Will have vascular surgery evaluate for help with her swelling.  Leg elevation.  Take an extra lasix for next two days.  Took an extra yesterday.  Check labs.

## 2016-11-09 NOTE — Assessment & Plan Note (Signed)
Previous ulcer.  Evaluated at wound clinic and Dr Ola Spurr.  With persistent increased swelling and stasis changes.  She has also been scratching and picking skin lesions.  Will have dermatology reevaluate and have vascular reevaluate to help with swelling.

## 2016-11-09 NOTE — Assessment & Plan Note (Signed)
Follow lipid panel.   

## 2016-11-09 NOTE — Assessment & Plan Note (Signed)
Blood pressure under good control.  Continue same medication regimen.  Follow pressures.  Follow metabolic panel.   

## 2016-11-11 DIAGNOSIS — L989 Disorder of the skin and subcutaneous tissue, unspecified: Secondary | ICD-10-CM | POA: Diagnosis not present

## 2016-12-01 ENCOUNTER — Ambulatory Visit: Payer: Self-pay

## 2016-12-02 ENCOUNTER — Ambulatory Visit (INDEPENDENT_AMBULATORY_CARE_PROVIDER_SITE_OTHER): Payer: Medicare Other

## 2016-12-02 VITALS — BP 132/62 | HR 78 | Temp 97.9°F | Resp 16 | Ht 60.0 in | Wt 125.8 lb

## 2016-12-02 DIAGNOSIS — Z0000 Encounter for general adult medical examination without abnormal findings: Secondary | ICD-10-CM

## 2016-12-02 NOTE — Patient Instructions (Addendum)
[name] , Thank you for taking time to come for your Medicare Wellness Visit. I appreciate your ongoing commitment to your health goals. Please review the following plan we discussed and let me know if I can assist you in the future.   Follow up with Dr. Nicki Reaper as needed.    Bring a copy of your [place] and/or Living Will to be scanned into chart.  Have a great day!  These are the goals we discussed: Goals    . Healthy Lifestyle          Stay hydrated and drink plenty of fluids/water Stay active with daily squats Eat healthy foods        This is a list of the screening recommended for you and due dates:  Health Maintenance  Topic Date Due  . Tetanus Vaccine  06/10/1942  . DEXA scan (bone density measurement)  06/10/1988  . Pneumonia vaccines (2 of 2 - PCV13) 12/31/2012  . Flu Shot  03/18/2017      Fall Prevention in the Home Falls can cause injuries. They can happen to people of all ages. There are many things you can do to make your home safe and to help prevent falls. What can I do on the outside of my home?  Regularly fix the edges of walkways and driveways and fix any cracks.  Remove anything that might make you trip as you walk through a door, such as a raised step or threshold.  Trim any bushes or trees on the path to your home.  Use bright outdoor lighting.  Clear any walking paths of anything that might make someone trip, such as rocks or tools.  Regularly check to see if handrails are loose or broken. Make sure that both sides of any steps have handrails.  Any raised decks and porches should have guardrails on the edges.  Have any leaves, snow, or ice cleared regularly.  Use sand or salt on walking paths during winter.  Clean up any spills in your garage right away. This includes oil or grease spills. What can I do in the bathroom?  Use night lights.  Install grab bars by the toilet and in the tub and shower. Do not use towel  bars as grab bars.  Use non-skid mats or decals in the tub or shower.  If you need to sit down in the shower, use a plastic, non-slip stool.  Keep the floor dry. Clean up any water that spills on the floor as soon as it happens.  Remove soap buildup in the tub or shower regularly.  Attach bath mats securely with double-sided non-slip rug tape.  Do not have throw rugs and other things on the floor that can make you trip. What can I do in the bedroom?  Use night lights.  Make sure that you have a light by your bed that is easy to reach.  Do not use any sheets or blankets that are too big for your bed. They should not hang down onto the floor.  Have a firm chair that has side arms. You can use this for support while you get dressed.  Do not have throw rugs and other things on the floor that can make you trip. What can I do in the kitchen?  Clean up any spills right away.  Avoid walking on wet floors.  Keep items that you use a lot in easy-to-reach places.  If you need to reach something above you,  use a strong step stool that has a grab bar.  Keep electrical cords out of the way.  Do not use floor polish or wax that makes floors slippery. If you must use wax, use non-skid floor wax.  Do not have throw rugs and other things on the floor that can make you trip. What can I do with my stairs?  Do not leave any items on the stairs.  Make sure that there are handrails on both sides of the stairs and use them. Fix handrails that are broken or loose. Make sure that handrails are as long as the stairways.  Check any carpeting to make sure that it is firmly attached to the stairs. Fix any carpet that is loose or worn.  Avoid having throw rugs at the top or bottom of the stairs. If you do have throw rugs, attach them to the floor with carpet tape.  Make sure that you have a light switch at the top of the stairs and the bottom of the stairs. If you do not have them, ask someone to  add them for you. What else can I do to help prevent falls?  Wear shoes that:  Do not have high heels.  Have rubber bottoms.  Are comfortable and fit you well.  Are closed at the toe. Do not wear sandals.  If you use a stepladder:  Make sure that it is fully opened. Do not climb a closed stepladder.  Make sure that both sides of the stepladder are locked into place.  Ask someone to hold it for you, if possible.  Clearly mark and make sure that you can see:  Any grab bars or handrails.  First and last steps.  Where the edge of each step is.  Use tools that help you move around (mobility aids) if they are needed. These include:  Canes.  Walkers.  Scooters.  Crutches.  Turn on the lights when you go into a dark area. Replace any light bulbs as soon as they burn out.  Set up your furniture so you have a clear path. Avoid moving your furniture around.  If any of your floors are uneven, fix them.  If there are any pets around you, be aware of where they are.  Review your medicines with your doctor. Some medicines can make you feel dizzy. This can increase your chance of falling. Ask your doctor what other things that you can do to help prevent falls. This information is not intended to replace advice given to you by your health care provider. Make sure you discuss any questions you have with your health care provider. Document Released: 05/31/2009 Document Revised: 01/10/2016 Document Reviewed: 09/08/2014 Elsevier Interactive Patient Education  2017 [employer].

## 2016-12-02 NOTE — Progress Notes (Signed)
Subjective:   Kayla Galloway is a 83 y.o. female who presents for Medicare Annual (Subsequent) preventive examination.  Review of Systems:  No ROS.  Medicare Wellness Visit. Cardiac Risk Factors include: advanced age (>46men, >46 women);hypertension     Objective:     Vitals: BP 132/62 (BP Location: Left Arm, Patient Position: Sitting, Cuff Size: Normal)   Pulse 78   Temp 97.9 F (36.6 C) (Oral)   Resp 16   Ht 5' (1.524 m)   Wt 125 lb 12.8 oz (57.1 kg)   SpO2 97%   BMI 24.57 kg/m   Body mass index is 24.57 kg/m.   Tobacco History  Smoking Status  . Never Smoker  Smokeless Tobacco  . Never Used     Counseling given: Not Answered   Past Medical History:  Diagnosis Date  . Anemia   . Arthritis   . Cancer ([place])    skin ca lesion of scalp- removed  . Depression   . DVT (deep venous thrombosis), left    bilateral, IVC filter 2010  . GERD (gastroesophageal reflux disease)   . Gout   . Hypercholesterolemia   . Hyperkalemia   . Hypertension    Dr. [name]  . Nephrolithiasis   . Obesity   . Osteoporosis   . Peripheral vascular disease ([place])   . 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 Spider veins    Past Surgical History:  Procedure Laterality Date  . CARDIAC CATHETERIZATION     2010 Does not see a cardiac  doctor  . Colonsopy    . DENTAL SURGERY    . ECTOPIC PREGNANCY SURGERY    . EYE SURGERY Bilateral 2011,2012   cataracts  . FRACTURE SURGERY  2009   hip, rod from knee to hip  . HEMORRHOID SURGERY    . IVC filter Left    pt states it has turned side ways and could not be removed.  Marland Kitchen KYPHOPLASTY  09/03/2012   per patient, she has had kypho x 2:  Surgeon: Winfield Cunas, MD;  Location: [facility];  Service: Neurosurgery;  Laterality: N/A;  Thoracic eight Kyphoplasty  . OPEN REDUCTION INTERNAL FIXATION (ORIF) DISTAL RADIAL FRACTURE Left  09/06/2015   Procedure: OPEN REDUCTION INTERNAL FIXATION (ORIF) DISTAL RADIAL FRACTURE;  Surgeon: Hessie Knows, MD;  Location: [facility];  Service: Orthopedics;  Laterality: Left;  . RIGHT OOPHORECTOMY     partial-  . SKIN CANCER EXCISION     top of head  . TONSILLECTOMY     age 44  . VERTEBROPLASTY  12/31/2011   Procedure: VERTEBROPLASTY;  Surgeon: Winfield Cunas, MD;  Location: [facility];  Service: Neurosurgery;  Laterality: N/A;  Thoracic eleven vertebroplasty   Family History  Problem Relation Age of Onset  . Heart disease Father     myocardial infarction  . Heart disease Brother     myocardial infarction  . Lymphoma Sister   . Anesthesia problems Neg Hx   . Breast cancer Neg Hx   . Colon cancer Neg Hx    History  Sexual Activity  . Sexual activity: No    Outpatient Encounter Prescriptions as of 12/02/2016  Medication Sig  . acetaminophen (TYLENOL) 650 MG CR tablet Take 650 mg by mouth every 8 (eight) hours as needed for pain.   Marland Kitchen apixaban (ELIQUIS) 5 MG TABS tablet  Take 1 tablet (5 mg total) by mouth 2 (two) times daily.  . bifidobacterium infantis (ALIGN) capsule Take 1 capsule by mouth daily.  . Calcium Carb-Cholecalciferol (CALCIUM 600 + D PO) Take 1 tablet by mouth 3 (three) times daily.  . furosemide (LASIX) 20 MG tablet Take 1 tablet (20 mg total) by mouth daily.  . Glucosamine-Chondroitin 250-200 MG TABS Take by mouth.  . hydrocerin (EUCERIN) CREA Apply 1 application topically 2 (two) times daily.  . hydrOXYzine (ATARAX/VISTARIL) 10 MG tablet TAKE 2 & 1/2 TABLETS BY MOUTH 3 TIMES DAILY AS NEEDED ITCHING  . Misc Natural Products (OSTEO BI-FLEX JOINT SHIELD PO) Take 1 tablet by mouth 2 (two) times daily.  . mupirocin ointment (BACTROBAN) 2 % Apply to affected area bid  . omeprazole (PRILOSEC) 20 MG capsule Take 1 capsule by mouth  daily  . ramipril (ALTACE) 5 MG capsule Take 1 capsule by mouth  daily  . sertraline (ZOLOFT) 50 MG tablet Take 1 tablet by mouth  daily    . triamcinolone cream (KENALOG) 0.5 % Apply 1 application topically 2 (two) times daily.   No facility-administered encounter medications on file as of 12/02/2016.     Activities of Daily Living In your present state of health, do you have any difficulty performing the following activities: 12/02/2016  Hearing? N  Vision? N  Difficulty concentrating or making decisions? N  Walking or climbing stairs? Y  Dressing or bathing? N  Doing errands, shopping? Y  Preparing Food and eating ? N  Using the Toilet? N  In the past six months, have you accidently leaked urine? N  Do you have problems with loss of bowel control? N  Managing your Medications? N  Managing your Finances? N  Housekeeping or managing your Housekeeping? N  Some recent data might be hidden    Patient Care Team: [name], MD as PCP - General (Unknown Physician Specialty)    Assessment:    This is a routine wellness examination for Kayla Galloway. The goal of the wellness visit is to assist the patient how to close the gaps in care and create a preventative care plan for the patient.   Taking calcium VIT D as appropriate/Osteoporosis reviewed.  Medications reviewed; taking without issues or barriers.  Safety issues reviewed; smoke detectors in the home. No firearms in the home. Wears seatbelts when riding with others. Patient does wear sunscreen or protective clothing when in direct sunlight. No violence in the home.  Patient is alert, normal appearance, oriented to person/place/and time. Correctly identified the president of the [place], recall of 2/3 words, and performing simple calculations.  Patient displays appropriate judgement and can read correct time from watch face.  No new identified risk were noted.  No failures at ADL's or IADL's. Cane or walker in use when ambulating.  BMI- discussed the importance of a healthy diet, water intake and exercise. Educational material provided.   HTN- followed by  PCP.  Dental- every six months.  Dr. Leanor Kail.  Eye- Visual acuity not assessed per patient preference since they have regular follow up with the ophthalmologist.  Wears corrective lenses when reading.  Sleep patterns- Sleeps 7-8 hours at night.  Wakes feeling rested.    TDAP vaccine deferred per patient preference.  Educational material provided.  Patient Concerns: None at this time. Follow up with PCP as needed.  Exercise Activities and Dietary recommendations Current Exercise Habits: Home exercise routine, Time (Minutes): 10, Frequency (Times/Week): 7, Weekly Exercise (Minutes/Week): 70, Intensity:  Mild  Goals    . Healthy Lifestyle          Stay hydrated and drink plenty of fluids/water Stay active with daily squats Eat healthy foods       Fall Risk Fall Risk  12/02/2016 06/18/2016 06/11/2016 08/15/2014 05/04/2013  Falls in the past year? Yes No Yes Yes Yes  Number falls in past yr: 1 - 1 2 or more 2 or more  Injury with Fall? No - Yes - -  Risk Factor Category  High Fall Risk - - High Fall Risk High Fall Risk  Risk for fall due to : Impaired balance/gait;History of fall(s) - - Impaired balance/gait Impaired balance/gait  Follow up Falls prevention discussed - - - -   Depression Screen PHQ 2/9 Scores 12/02/2016 06/18/2016 08/15/2014 05/04/2013  PHQ - 2 Score 0 0 1 1     Cognitive Function     6CIT Screen 12/02/2016  What Year? 0 points  What month? 0 points  What time? 0 points  Count back from 20 0 points  Months in reverse 0 points  Repeat phrase 0 points  Total Score 0    Immunization History  Administered Date(s) Administered  . Pneumococcal Polysaccharide-23 01/01/2012   Screening Tests Health Maintenance  Topic Date Due  . TETANUS/TDAP  06/10/1942  . DEXA SCAN  06/10/1988  . PNA vac Low Risk Adult (2 of 2 - PCV13) 12/31/2012  . INFLUENZA VACCINE  03/18/2017      Plan:    End of life planning; Advance aging; Advanced directives discussed. Copy  of current HCPOA/Living Will requested.     Medicare Attestation I have personally reviewed: The patient's medical and social history Their use of alcohol, tobacco or illicit drugs Their current medications and supplements The patient's functional ability including ADLs,fall risks, home safety risks, cognitive, and hearing and visual impairment Diet and physical activities Evidence for depression   The patient's weight, height, BMI, and visual acuity have been recorded in the chart.  I have made referrals and provided education to the patient based on review of the above and I have provided the patient with a written personalized care plan for preventive services.    During the course of the visit the patient was educated and counseled about the following appropriate screening and preventive services:   Vaccines to include Pneumoccal, Influenza, Hepatitis B, Td, Zostavax, HCV  Colorectal cancer screening-UTD  Bone density screening-discussed, educational material provided  Glaucoma screening-annual eye exam  Mammography/PAP-aged out/UTD  Nutrition counseling   Patient Instructions (the written plan) was given to the patient.   Varney Biles, LPN  9/32/6712   Reviewed above information.  Agree with plan.   Dr Nicki Reaper

## 2016-12-09 ENCOUNTER — Ambulatory Visit (INDEPENDENT_AMBULATORY_CARE_PROVIDER_SITE_OTHER): Payer: Medicare Other | Admitting: Vascular Surgery

## 2016-12-09 ENCOUNTER — Encounter (INDEPENDENT_AMBULATORY_CARE_PROVIDER_SITE_OTHER): Payer: Self-pay | Admitting: Vascular Surgery

## 2016-12-09 VITALS — BP 180/81 | HR 76 | Resp 16 | Wt 125.0 lb

## 2016-12-09 DIAGNOSIS — N289 Disorder of kidney and ureter, unspecified: Secondary | ICD-10-CM

## 2016-12-09 DIAGNOSIS — M7989 Other specified soft tissue disorders: Secondary | ICD-10-CM

## 2016-12-09 DIAGNOSIS — I10 Essential (primary) hypertension: Secondary | ICD-10-CM

## 2016-12-09 DIAGNOSIS — I87009 Postthrombotic syndrome without complications of unspecified extremity: Secondary | ICD-10-CM

## 2016-12-09 DIAGNOSIS — Z95828 Presence of other vascular implants and grafts: Secondary | ICD-10-CM | POA: Diagnosis not present

## 2016-12-09 NOTE — Assessment & Plan Note (Signed)
Likely contributing to her chronic swelling as well as lymphedema and multiple medical issues. Swelling is currently reasonably well controlled. Continue compression, elevation, and activity as tolerated.

## 2016-12-09 NOTE — Assessment & Plan Note (Signed)
Reasonably stable at this point. Actually better than her last visit. No major changes in her current plan of care. Continue recommending compression stockings and elevation. Return to clinic in 6 months.

## 2016-12-09 NOTE — Progress Notes (Signed)
MRN : [id number]  Kayla Galloway is a 83 y.o. (07-31-1921) female who presents with chief complaint of  Chief Complaint  Patient presents with  . Follow-up  .  History of Present Illness: Patient returns today in follow up of Leg swelling. Her leg swelling is actual bit better today than it was at her last visit. She and her family that accompanies her says that the swelling is variable and that some days it can be very swollen and some days the swelling is reasonably mild. Doesn't sound like she is really wearing her compression stockings all that regularly. She has some shallow scabs on the left lateral calf that she says she got from following up the steps. She has no fever or chills. There is no sign of infection.       Past Medical History:  Diagnosis Date  . Anemia   . Arthritis   . Cancer ([place])    skin ca lesion of scalp- removed  . Depression   . DVT (deep venous thrombosis), left    bilateral, IVC filter 2010  . GERD (gastroesophageal reflux disease)   . Gout   . Hypercholesterolemia   . Hyperkalemia   . Hypertension    Dr. [name]  . Nephrolithiasis   . Obesity   . Osteoporosis   . Peripheral vascular disease ([place])   . 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 Spider veins          Past Surgical History:  Procedure Laterality Date  . CARDIAC CATHETERIZATION     2010 Does not see a cardiac doctor  . Colonsopy    . DENTAL SURGERY    . ECTOPIC PREGNANCY SURGERY    . EYE SURGERY Bilateral 2011,2012   cataracts  . FRACTURE SURGERY  2009   hip, rod from knee to hip  . HEMORRHOID SURGERY    . IVC filter Left    pt states it has turned side ways and could not be removed.  Marland Kitchen KYPHOPLASTY  09/03/2012   per patient, she has had kypho x 2: Surgeon: Winfield Cunas, MD; Location: [facility]; Service:  Neurosurgery; Laterality: N/A; Thoracic eight Kyphoplasty  . OPEN REDUCTION INTERNAL FIXATION (ORIF) DISTAL RADIAL FRACTURE Left 09/06/2015   Procedure: OPEN REDUCTION INTERNAL FIXATION (ORIF) DISTAL RADIAL FRACTURE; Surgeon: Hessie Knows, MD; Location: [facility]; Service: Orthopedics; Laterality: Left;  . RIGHT OOPHORECTOMY     partial-  . SKIN CANCER EXCISION     top of head  . TONSILLECTOMY     age 4  . VERTEBROPLASTY  12/31/2011   Procedure: VERTEBROPLASTY; Surgeon: Winfield Cunas, MD; Location: [facility]; Service: Neurosurgery; Laterality: N/A; Thoracic eleven vertebroplasty          Family History  Problem Relation Age of Onset  . Heart disease Father     myocardial infarction  . Heart disease Brother     myocardial infarction  . Lymphoma Sister   . Anesthesia problems Neg Hx   . Breast cancer Neg Hx   . Colon cancer Neg Hx      Social History     Social History  Substance Use Topics  . Smoking status: Never Smoker  . Smokeless tobacco: Never Used  . Alcohol use No  Still lives independently. No IV drug use.       Allergies  Allergen  Reactions  . Cefuroxime Axetil Rash    Pt states that she does fine with Keflex.   . Doxycycline Nausea Only and Other (See Comments)    Reaction: Weight loss   . Advil [Ibuprofen] Swelling  . Celebrex [Celecoxib] Nausea Only  . Daypro [Oxaprozin] Other (See Comments)    Reaction: Dizziness   . Etodolac Nausea Only  . Macrobid [employer] Macro] Other (See Comments)    Reaction: Burning   . Penicillins Other (See Comments)    Reaction: Burning   . Percocet [Oxycodone-Acetaminophen] Nausea Only and Swelling  . Tramadol Other (See Comments)    Reaction: Unknown   . Valium [Diazepam] Other (See Comments)    Reaction: Dizziness   . Neosporin [Neomycin-Bacitracin Zn-Polymyx] Rash  . Vicodin [Hydrocodone-Acetaminophen] Nausea Only           Current Outpatient Prescriptions  Medication Sig Dispense Refill  . acetaminophen (TYLENOL) 650 MG CR tablet Take 650 mg by mouth every 8 (eight) hours as needed for pain.     Marland Kitchen apixaban (ELIQUIS) 5 MG TABS tablet Take 1 tablet (5 mg total) by mouth 2 (two) times daily. 60 tablet 0  . bifidobacterium infantis (ALIGN) capsule Take 1 capsule by mouth daily. 30 capsule 0  . Calcium Carb-Cholecalciferol (CALCIUM 600 + D PO) Take 1 tablet by mouth 3 (three) times daily.    . furosemide (LASIX) 20 MG tablet Take 1 tablet (20 mg total) by mouth daily. 90 tablet 2  . Glucosamine-Chondroitin 250-200 MG TABS Take by mouth.    . hydrocerin (EUCERIN) CREA Apply 1 application topically 2 (two) times daily. 113 g 1  . hydrOXYzine (ATARAX/VISTARIL) 10 MG tablet TAKE 2 & 1/2 TABLETS BY MOUTH 3 TIMES DAILY AS NEEDED FOR ITCHING 30 tablet 0  . levofloxacin (LEVAQUIN) 250 MG tablet Take 1 tablet (250 mg total) by mouth daily. 5 tablet 0  . Misc Natural Products (OSTEO BI-FLEX JOINT SHIELD PO) Take 1 tablet by mouth 2 (two) times daily.    . mupirocin ointment (BACTROBAN) 2 % Apply to affected area bid 22 g 0  . omeprazole (PRILOSEC) 20 MG capsule Take 1 capsule by mouth daily 90 capsule 2  . ramipril (ALTACE) 5 MG capsule Take 1 capsule by mouth daily 90 capsule 3  . sertraline (ZOLOFT) 50 MG tablet Take 1 tablet by mouth daily 90 tablet 0  . triamcinolone cream (KENALOG) 0.5 % Apply 1 application topically 2 (two) times daily. 30 g 1   No current facility-administered medications for this visit.       REVIEW OF SYSTEMS(Negative unless checked)  Constitutional: [] Weight loss[] Fever[] Chills Cardiac:[] Chest pain[] Chest pressure[] Palpitations [] Shortness of breath when laying flat [] Shortness of breath at rest [] Shortness of breath with exertion. Vascular: [] Pain in legs with walking[] Pain in legsat rest[] Pain in legs when laying flat [] Claudication [] Pain in  feet when walking [] Pain in feet at rest [] Pain in feet when laying flat [x] History of DVT [] Phlebitis [x] Swelling in legs [] Varicose veins [] Non-healing ulcers Pulmonary: [] Uses home oxygen [] Productive cough[] Hemoptysis [] Wheeze [] COPD [] Asthma Neurologic: [] Dizziness [] Blackouts [] Seizures [] History of stroke [] History of TIA[] Aphasia [] Temporary blindness[] Dysphagia [] Weaknessor numbness in arms [] Weakness or numbnessin legs Musculoskeletal: [x] Arthritis [] Joint swelling [] Joint pain [] Low back pain Hematologic:[] Easy bruising[] Easy bleeding [] Hypercoagulable state [] Anemic [] Hepatitis Gastrointestinal:[] Blood in stool[] Vomiting blood[] Gastroesophageal reflux/heartburn[] Abdominal pain Genitourinary: [] Chronic kidney disease [] Difficulturination [] Frequenturination [] Burning with urination[] Hematuria Skin: [] Rashes [] Ulcers [] Wounds Psychological: [] History of anxiety[] History of major depression.    Physical Examination  BP (!) 180/81   Pulse 76   Resp 16  Wt 125 lb (56.7 kg)   BMI 24.41 kg/m  Gen:  WD/WN, NAD. Appears younger than stated age. Head: [place]/AT, No temporalis wasting. Ear/Nose/Throat: Hearing diminished, nares w/o erythema or drainage, trachea midline Eyes: Conjunctiva clear. Sclera non-icteric Neck: Supple.  No JVD.  Pulmonary:  Good air movement, no use of accessory muscles.  Cardiac: RRR, normal S1, S2 Vascular:  Vessel Right Left  Radial Palpable Palpable  Ulnar Palpable Palpable  Brachial Palpable Palpable  Carotid Palpable, without bruit Palpable, without bruit  Aorta Not palpable N/A  Femoral Palpable Palpable  Popliteal Palpable Palpable  PT Not Palpable Not Palpable  DP Palpable Palpable   Gastrointestinal: soft, non-tender/non-distended. No guarding/reflex.  Musculoskeletal: M/S 5/5 throughout.  No deformity or atrophy. 2+ right lower extremity edema, 1-2+ left  lower extremity edema. Neurologic: Sensation grossly intact in extremities.  Symmetrical.  Speech is fluent.  Psychiatric: Judgment intact, Mood & affect appropriate for pt's clinical situation. Dermatologic: A few small shallow scabs are present on the left lateral calf without erythema or drainage.       Labs Recent Results (from the past 2160 hour(s))  CBC with Differential/Platelet     Status: Abnormal   Collection Time: 11/04/16 12:10 PM  Result Value Ref Range   WBC 6.5 4.0 - 10.5 K/uL   RBC 3.51 (L) 3.87 - 5.11 Mil/uL   Hemoglobin 11.2 (L) 12.0 - 15.0 g/dL   HCT 34.3 (L) 36.0 - 46.0 %   MCV 97.7 78.0 - 100.0 fl   MCHC 32.8 30.0 - 36.0 g/dL   RDW 13.9 11.5 - 15.5 %   Platelets 188.0 150.0 - 400.0 K/uL   Neutrophils Relative % 62.9 43.0 - 77.0 %   Lymphocytes Relative 22.6 12.0 - 46.0 %   Monocytes Relative 11.5 3.0 - 12.0 %   Eosinophils Relative 2.5 0.0 - 5.0 %   Basophils Relative 0.5 0.0 - 3.0 %   Neutro Abs 4.1 1.4 - 7.7 K/uL   Lymphs Abs 1.5 0.7 - 4.0 K/uL   Monocytes Absolute 0.7 0.1 - 1.0 K/uL   Eosinophils Absolute 0.2 0.0 - 0.7 K/uL   Basophils Absolute 0.0 0.0 - 0.1 K/uL  Hepatic function panel     Status: None   Collection Time: 11/04/16 12:10 PM  Result Value Ref Range   Total Bilirubin 0.4 0.2 - 1.2 mg/dL   Bilirubin, Direct 0.1 0.0 - 0.3 mg/dL   Alkaline Phosphatase 61 39 - 117 U/L   AST 23 0 - 37 U/L   ALT 12 0 - 35 U/L   Total Protein 7.3 6.0 - 8.3 g/dL   Albumin 4.3 3.5 - 5.2 g/dL  Basic metabolic panel     Status: Abnormal   Collection Time: 11/04/16 12:10 PM  Result Value Ref Range   Sodium 137 135 - 145 mEq/L   Potassium 4.0 3.5 - 5.1 mEq/L   Chloride 100 96 - 112 mEq/L   CO2 27 19 - 32 mEq/L   Glucose, Bld 97 70 - 99 mg/dL   BUN 31 (H) 6 - 23 mg/dL   Creatinine, Ser 1.18 0.40 - 1.20 mg/dL   Calcium 10.0 8.4 - 10.5 mg/dL   GFR 45.39 (L) >60.00 mL/min    Radiology No results found.   Assessment/Plan Renal insufficiency Likely  contributing to leg swelling as well.   Hypertension blood pressure control important in reducing the progression of atherosclerotic disease. On appropriate oral medications.  Postphlebitic syndrome without complication Likely contributing to her chronic swelling  as well as lymphedema and multiple medical issues. Swelling is currently reasonably well controlled. Continue compression, elevation, and activity as tolerated.  Swelling of limb Reasonably stable at this point. Actually better than her last visit. No major changes in her current plan of care. Continue recommending compression stockings and elevation. Return to clinic in 6 months.    Leotis Pain, MD  12/09/2016 12:12 PM    This note was created with Dragon medical transcription system.  Any errors from dictation are purely unintentional

## 2016-12-15 DIAGNOSIS — L989 Disorder of the skin and subcutaneous tissue, unspecified: Secondary | ICD-10-CM | POA: Diagnosis not present

## 2016-12-30 ENCOUNTER — Encounter: Payer: Self-pay | Admitting: Internal Medicine

## 2016-12-30 ENCOUNTER — Ambulatory Visit (INDEPENDENT_AMBULATORY_CARE_PROVIDER_SITE_OTHER): Payer: Medicare Other | Admitting: Internal Medicine

## 2016-12-30 VITALS — BP 144/68 | HR 73 | Temp 97.9°F | Resp 14 | Ht 60.0 in | Wt 127.0 lb

## 2016-12-30 DIAGNOSIS — I10 Essential (primary) hypertension: Secondary | ICD-10-CM

## 2016-12-30 DIAGNOSIS — R413 Other amnesia: Secondary | ICD-10-CM | POA: Diagnosis not present

## 2016-12-30 DIAGNOSIS — N289 Disorder of kidney and ureter, unspecified: Secondary | ICD-10-CM

## 2016-12-30 DIAGNOSIS — D649 Anemia, unspecified: Secondary | ICD-10-CM

## 2016-12-30 DIAGNOSIS — L97209 Non-pressure chronic ulcer of unspecified calf with unspecified severity: Secondary | ICD-10-CM | POA: Diagnosis not present

## 2016-12-30 DIAGNOSIS — I872 Venous insufficiency (chronic) (peripheral): Secondary | ICD-10-CM

## 2016-12-30 DIAGNOSIS — E7800 Pure hypercholesterolemia, unspecified: Secondary | ICD-10-CM | POA: Diagnosis not present

## 2016-12-30 DIAGNOSIS — R600 Localized edema: Secondary | ICD-10-CM

## 2016-12-30 NOTE — Progress Notes (Signed)
Pre-visit discussion using our clinic review tool. No additional management support is needed unless otherwise documented below in the visit note.  

## 2017-01-01 IMAGING — CR DG WRIST 2V*L*
1 series · 2 of 2 positions shown · non-contrast
Comparison: None.

CLINICAL DATA: Status post ORIF left wrist

EXAM:
LEFT WRIST - 2 VIEW

[Series 1: pa · 0.17mm/px · 2 of 2 slices shown]
[im 1/2]
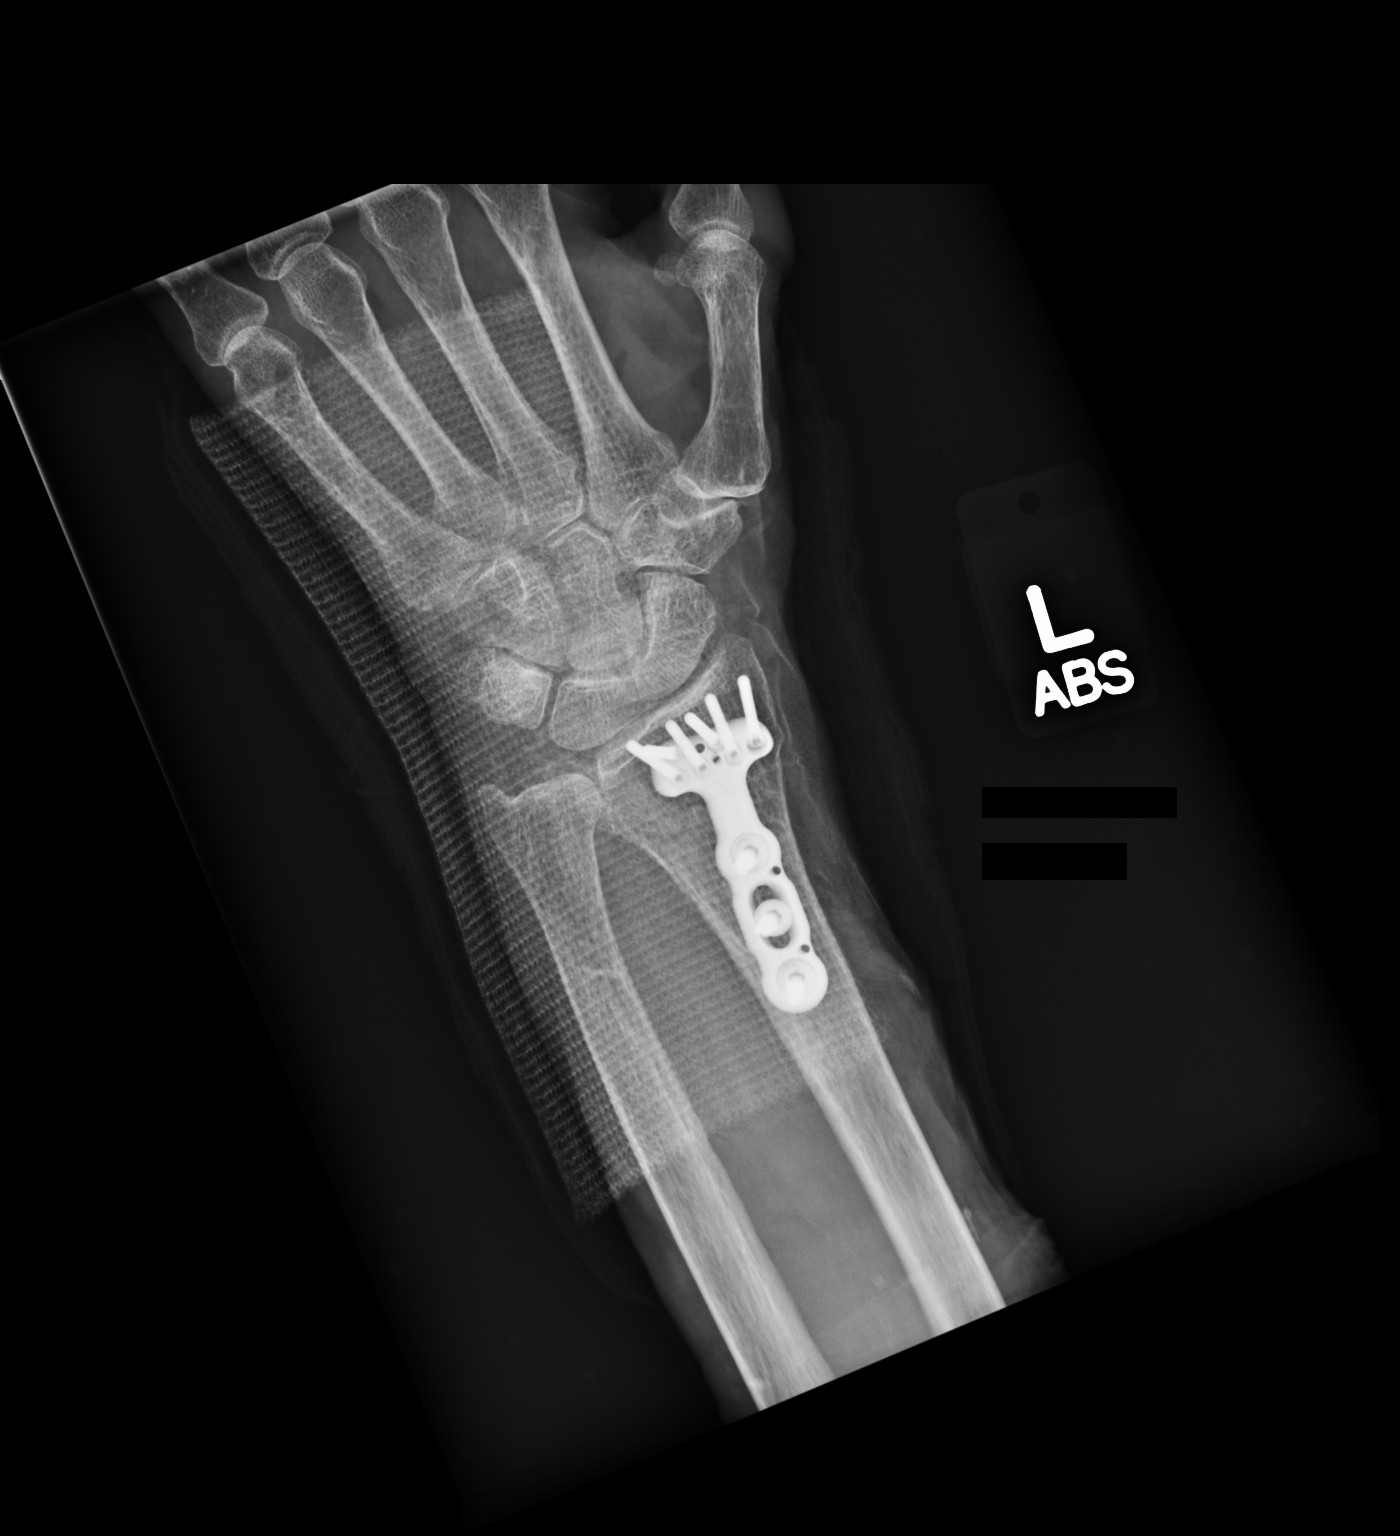
[im 2/2]
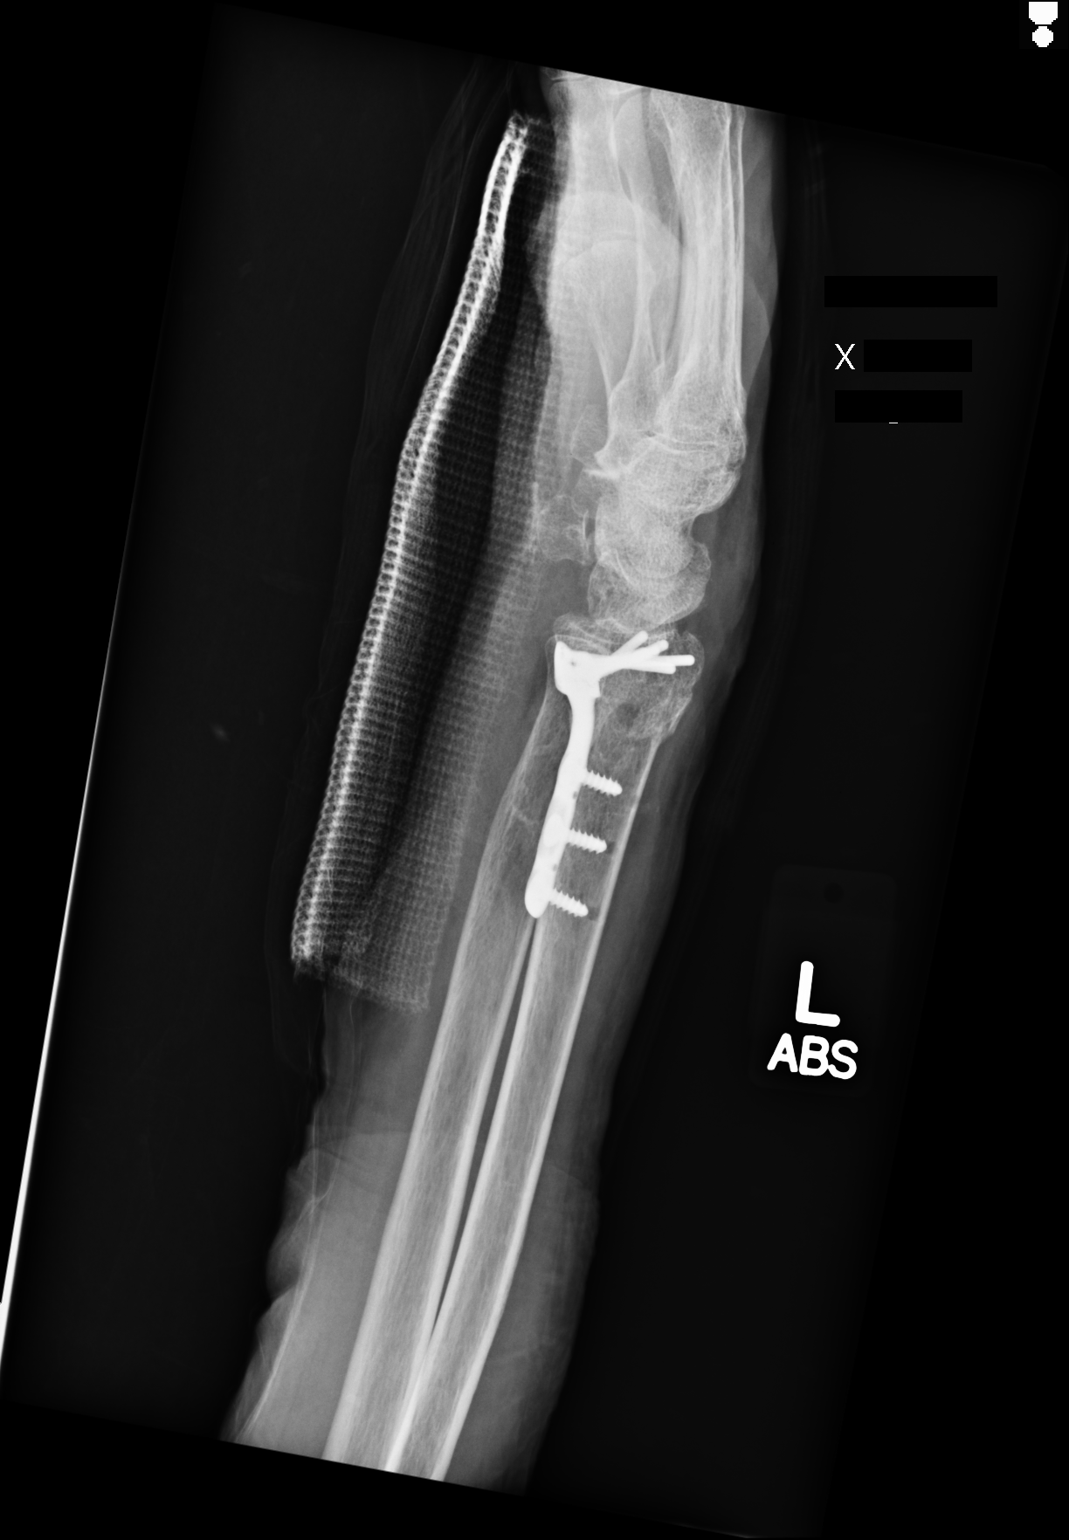

[2 of 2 positions shown; findings below may reference images not displayed]

FINDINGS: There is a comminuted left distal radial metaphysis fracture
transfixed with a metallic side plate and multiple interlocking
screws. The alignment is near anatomic. There is no other fracture
or dislocation.
IMPRESSION: Comminuted left distal radial metaphysis fracture transfixed with a
metallic side plate and multiple interlocking screws without failure
or complication.

## 2017-01-02 ENCOUNTER — Other Ambulatory Visit: Payer: Self-pay | Admitting: Internal Medicine

## 2017-01-12 ENCOUNTER — Encounter: Payer: Self-pay | Admitting: Internal Medicine

## 2017-01-12 NOTE — Assessment & Plan Note (Signed)
Follow lipid panel.   

## 2017-01-12 NOTE — Progress Notes (Signed)
Patient ID: Kayla Galloway, female   DOB: 08-03-1921, 83 y.o.   MRN: [id number]   Subjective:    Patient ID: Kayla Galloway, female    DOB: 1921-02-05, 83 y.o.   MRN: [id number]  HPI  Patient here for a scheduled follow up.  She is accompanied by her sister.  History obtained from both of them.  She reports she is doing better.  Leg is better.  Lesions are improving.  Swelling better.  Continues with compression, elevation and activity.  Continues to f/u with dermatology as well.  She is eating.  No nausea or vomiting.  Bowels moving.  No abdominal pain.  Sister is concerned regarding her memory.  She is answering questions appropriately.  Discussed memory.  She declines any further evaluation.     Past Medical History:  Diagnosis Date  . Anemia   . Arthritis   . Cancer ([place])    skin ca lesion of scalp- removed  . Depression   . DVT (deep venous thrombosis), left    bilateral, IVC filter 2010  . GERD (gastroesophageal reflux disease)   . Gout   . Hypercholesterolemia   . Hyperkalemia   . Hypertension    Dr. [name]  . Nephrolithiasis   . Obesity   . Osteoporosis   . Peripheral vascular disease ([place])   . 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 Spider veins    Past Surgical History:  Procedure Laterality Date  . CARDIAC CATHETERIZATION     2010 Does not see a cardiac  doctor  . Colonsopy    . DENTAL SURGERY    . ECTOPIC PREGNANCY SURGERY    . EYE SURGERY Bilateral 2011,2012   cataracts  . FRACTURE SURGERY  2009   hip, rod from knee to hip  . HEMORRHOID SURGERY    . IVC filter Left    pt states it has turned side ways and could not be removed.  Marland Kitchen KYPHOPLASTY  09/03/2012   per patient, she has had kypho x 2:  Surgeon: Winfield Cunas, MD;  Location: [place] [facility];  Service: Neurosurgery;  Laterality: N/A;  Thoracic eight Kyphoplasty  . OPEN REDUCTION INTERNAL  FIXATION (ORIF) DISTAL RADIAL FRACTURE Left 09/06/2015   Procedure: OPEN REDUCTION INTERNAL FIXATION (ORIF) DISTAL RADIAL FRACTURE;  Surgeon: Hessie Knows, MD;  Location: [facility] ORS;  Service: Orthopedics;  Laterality: Left;  . RIGHT OOPHORECTOMY     partial-  . SKIN CANCER EXCISION     top of head  . TONSILLECTOMY     age 69  . VERTEBROPLASTY  12/31/2011   Procedure: VERTEBROPLASTY;  Surgeon: Winfield Cunas, MD;  Location: [place] [facility];  Service: Neurosurgery;  Laterality: N/A;  Thoracic eleven vertebroplasty   Family History  Problem Relation Age of Onset  . Heart disease Father        myocardial infarction  . Heart disease Brother        myocardial infarction  . Lymphoma Sister   . Anesthesia problems Neg Hx   . Breast cancer Neg Hx   . Colon cancer Neg Hx    Social History   Social History  . Marital status: Widowed    Spouse name: N/A  . Number of children: 0  . Years of education: N/A   Social History Main Topics  . Smoking status: Never Smoker  . Smokeless tobacco:  Never Used  . Alcohol use No  . Drug use: No  . Sexual activity: No   Other Topics Concern  . None   Social History Narrative  . None    Outpatient Encounter Prescriptions as of 12/30/2016  Medication Sig  . acetaminophen (TYLENOL) 650 MG CR tablet Take 650 mg by mouth every 8 (eight) hours as needed for pain.   Marland Kitchen apixaban (ELIQUIS) 5 MG TABS tablet Take 1 tablet (5 mg total) by mouth 2 (two) times daily.  . bifidobacterium infantis (ALIGN) capsule Take 1 capsule by mouth daily.  . Calcium Carb-Cholecalciferol (CALCIUM 600 + D PO) Take 1 tablet by mouth 3 (three) times daily.  . Glucosamine-Chondroitin 250-200 MG TABS Take by mouth.  . hydrocerin (EUCERIN) CREA Apply 1 application topically 2 (two) times daily.  . hydrOXYzine (ATARAX/VISTARIL) 10 MG tablet TAKE 2 & 1/2 TABLETS BY MOUTH 3 TIMES DAILY AS NEEDED ITCHING  . Misc Natural Products (OSTEO BI-FLEX JOINT SHIELD PO) Take 1 tablet by mouth 2  (two) times daily.  . mupirocin ointment (BACTROBAN) 2 % Apply to affected area bid  . omeprazole (PRILOSEC) 20 MG capsule Take 1 capsule by mouth  daily  . ramipril (ALTACE) 5 MG capsule Take 1 capsule by mouth  daily  . sertraline (ZOLOFT) 50 MG tablet Take 1 tablet by mouth  daily  . triamcinolone cream (KENALOG) 0.5 % Apply 1 application topically 2 (two) times daily.  . [DISCONTINUED] furosemide (LASIX) 20 MG tablet Take 1 tablet (20 mg total) by mouth daily.   No facility-administered encounter medications on file as of 12/30/2016.     Review of Systems  Constitutional: Negative for appetite change and unexpected weight change.  HENT: Negative for congestion and sinus pressure.   Respiratory: Negative for cough, chest tightness and shortness of breath.   Cardiovascular: Negative for chest pain and palpitations.       Leg swelling improved.    Gastrointestinal: Negative for abdominal pain, diarrhea, nausea and vomiting.  Genitourinary: Negative for difficulty urinating and dysuria.  Musculoskeletal: Negative for back pain and joint swelling.  Skin: Negative for color change and rash.  Neurological: Negative for dizziness, light-headedness and headaches.  Psychiatric/Behavioral: Negative for agitation and dysphoric mood.       Objective:    Physical Exam  Constitutional: She appears well-developed and well-nourished. No distress.  HENT:  Nose: Nose normal.  Mouth/Throat: Oropharynx is clear and moist.  Neck: Neck supple. No thyromegaly present.  Cardiovascular: Normal rate and regular rhythm.   Pulmonary/Chest: Breath sounds normal. No respiratory distress. She has no wheezes.  Abdominal: Soft. Bowel sounds are normal. There is no tenderness.  Musculoskeletal: She exhibits no tenderness.  Lower extremity edema - improved.    Lymphadenopathy:    She has no cervical adenopathy.  Skin:  Lesions improved.  Still some open lesions, but overall improved.  Stasis changes.   Decreased erythema.    Psychiatric: She has a normal mood and affect. Her behavior is normal.    BP (!) 144/68 (BP Location: Left Arm, Patient Position: Sitting, Cuff Size: Normal)   Pulse 73   Temp 97.9 F (36.6 C) (Oral)   Resp 14   Ht 5' (1.524 m)   Wt 127 lb (57.6 kg)   SpO2 96%   BMI 24.80 kg/m  Wt Readings from Last 3 Encounters:  12/30/16 127 lb (57.6 kg)  12/09/16 125 lb (56.7 kg)  12/02/16 125 lb 12.8 oz (57.1 kg)  Lab Results  Component Value Date   WBC 6.5 11/04/2016   HGB 11.2 (L) 11/04/2016   HCT 34.3 (L) 11/04/2016   PLT 188.0 11/04/2016   GLUCOSE 97 11/04/2016   CHOL 229 (H) 07/12/2012   TRIG 87.0 07/12/2012   HDL 86.30 07/12/2012   LDLDIRECT 135.9 07/12/2012   ALT 12 11/04/2016   AST 23 11/04/2016   NA 137 11/04/2016   K 4.0 11/04/2016   CL 100 11/04/2016   CREATININE 1.18 11/04/2016   BUN 31 (H) 11/04/2016   CO2 27 11/04/2016   TSH 1.61 06/11/2016   INR 1.00 01/24/2015    US Venous Img Lower Unilateral Right  Result Date: 06/11/2016 CLINICAL DATA:  Right lower extremity edema, pain and erythema. History of prior DVT in the left lower extremity. EXAM: RIGHT LOWER EXTREMITY VENOUS DOPPLER ULTRASOUND TECHNIQUE: Gray-scale sonography with graded compression, as well as color Doppler and duplex ultrasound were performed to evaluate the lower extremity deep venous systems from the level of the common femoral vein and including the common femoral, femoral, profunda femoral, popliteal and calf veins including the posterior tibial, peroneal and gastrocnemius veins when visible. The superficial great saphenous vein was also interrogated. Spectral Doppler was utilized to evaluate flow at rest and with distal augmentation maneuvers in the common femoral, femoral and popliteal veins. COMPARISON:  None. FINDINGS: Contralateral Common Femoral Vein: Respiratory phasicity is normal and symmetric with the symptomatic side. No evidence of thrombus. Normal  compressibility. Common Femoral Vein: No evidence of thrombus. Normal compressibility, respiratory phasicity and response to augmentation. Saphenofemoral Junction: No evidence of thrombus. Normal compressibility and flow on color Doppler imaging. Profunda Femoral Vein: No evidence of thrombus. Normal compressibility and flow on color Doppler imaging. Femoral Vein: No evidence of thrombus. Normal compressibility, respiratory phasicity and response to augmentation. Popliteal Vein: No evidence of thrombus. Normal compressibility, respiratory phasicity and response to augmentation. Calf Veins: No evidence of thrombus. Normal compressibility and flow on color Doppler imaging. Superficial Great Saphenous Vein: No evidence of thrombus. Normal compressibility and flow on color Doppler imaging. Venous Reflux:  None. Other Findings: No evidence of superficial thrombophlebitis or abnormal fluid collection. IMPRESSION: No evidence of right lower extremity deep venous thrombosis. Electronically Signed   By: Aletta Edouard M.D.   On: 06/11/2016 13:55       Assessment & Plan:   Problem List Items Addressed This Visit    Anemia    hgb 11/04/16 - stable 11.2.  Follow.        Bilateral lower extremity edema    Improved.  Saw [facility].  Continue compression, activity and elevation.  Follow.        Hypercholesterolemia    Follow lipid panel.       Hypertension    Blood pressure has been under reasonable control.  Same medication regimen.  Follow pressures.  Follow metabolic panel.        Renal insufficiency    Creatinine has been stable.  Follow.        Stasis ulcer ([place])    Previous ulcer.  Now with small healing lesions present.  Swelling improved.  Decreased erythema.  Follow.         Other Visit Diagnoses    Memory change    -  Primary   Sister mentioned.  Mini mental status performed.  did well.  desires no further intervention.  follow.         [name], MD

## 2017-01-12 NOTE — Assessment & Plan Note (Signed)
hgb 11/04/16 - stable 11.2.  Follow.

## 2017-01-12 NOTE — Assessment & Plan Note (Signed)
Blood pressure has been under reasonable control.  Same medication regimen.  Follow pressures.  Follow metabolic panel.   

## 2017-01-12 NOTE — Assessment & Plan Note (Signed)
Previous ulcer.  Now with small healing lesions present.  Swelling improved.  Decreased erythema.  Follow.

## 2017-01-12 NOTE — Assessment & Plan Note (Signed)
Improved.  Saw [facility].  Continue compression, activity and elevation.  Follow.

## 2017-01-12 NOTE — Assessment & Plan Note (Signed)
Creatinine has been stable.  Follow.   

## 2017-01-30 DIAGNOSIS — Z961 Presence of intraocular lens: Secondary | ICD-10-CM | POA: Diagnosis not present

## 2017-02-03 ENCOUNTER — Encounter: Payer: Self-pay | Admitting: Emergency Medicine

## 2017-02-03 ENCOUNTER — Emergency Department: Payer: Medicare Other

## 2017-02-03 ENCOUNTER — Inpatient Hospital Stay
Admission: EM | Admit: 2017-02-03 | Discharge: 2017-02-10 | DRG: 184 | Disposition: A | Discharge status: Skilled Nursing Facility | Payer: Medicare Other | Attending: Internal Medicine | Admitting: Internal Medicine

## 2017-02-03 DIAGNOSIS — F329 Major depressive disorder, single episode, unspecified: Secondary | ICD-10-CM | POA: Diagnosis present

## 2017-02-03 DIAGNOSIS — Z888 Allergy status to other drugs, medicaments and biological substances status: Secondary | ICD-10-CM

## 2017-02-03 DIAGNOSIS — I10 Essential (primary) hypertension: Secondary | ICD-10-CM | POA: Diagnosis present

## 2017-02-03 DIAGNOSIS — Z79899 Other long term (current) drug therapy: Secondary | ICD-10-CM

## 2017-02-03 DIAGNOSIS — Z885 Allergy status to narcotic agent status: Secondary | ICD-10-CM

## 2017-02-03 DIAGNOSIS — R0902 Hypoxemia: Secondary | ICD-10-CM | POA: Diagnosis present

## 2017-02-03 DIAGNOSIS — S2249XA Multiple fractures of ribs, unspecified side, initial encounter for closed fracture: Secondary | ICD-10-CM | POA: Diagnosis present

## 2017-02-03 DIAGNOSIS — T501X5A Adverse effect of loop [high-ceiling] diuretics, initial encounter: Secondary | ICD-10-CM | POA: Diagnosis present

## 2017-02-03 DIAGNOSIS — S0181XA Laceration without foreign body of other part of head, initial encounter: Secondary | ICD-10-CM | POA: Diagnosis present

## 2017-02-03 DIAGNOSIS — R1012 Left upper quadrant pain: Secondary | ICD-10-CM | POA: Diagnosis not present

## 2017-02-03 DIAGNOSIS — Z86718 Personal history of other venous thrombosis and embolism: Secondary | ICD-10-CM

## 2017-02-03 DIAGNOSIS — M25572 Pain in left ankle and joints of left foot: Secondary | ICD-10-CM | POA: Diagnosis not present

## 2017-02-03 DIAGNOSIS — Z7901 Long term (current) use of anticoagulants: Secondary | ICD-10-CM

## 2017-02-03 DIAGNOSIS — S99912A Unspecified injury of left ankle, initial encounter: Secondary | ICD-10-CM | POA: Diagnosis not present

## 2017-02-03 DIAGNOSIS — W010XXA Fall on same level from slipping, tripping and stumbling without subsequent striking against object, initial encounter: Secondary | ICD-10-CM | POA: Diagnosis present

## 2017-02-03 DIAGNOSIS — K5900 Constipation, unspecified: Secondary | ICD-10-CM | POA: Diagnosis not present

## 2017-02-03 DIAGNOSIS — M1990 Unspecified osteoarthritis, unspecified site: Secondary | ICD-10-CM | POA: Diagnosis present

## 2017-02-03 DIAGNOSIS — W19XXXA Unspecified fall, initial encounter: Secondary | ICD-10-CM | POA: Diagnosis not present

## 2017-02-03 DIAGNOSIS — Z886 Allergy status to analgesic agent status: Secondary | ICD-10-CM

## 2017-02-03 DIAGNOSIS — S2239XA Fracture of one rib, unspecified side, initial encounter for closed fracture: Secondary | ICD-10-CM | POA: Diagnosis present

## 2017-02-03 DIAGNOSIS — M25512 Pain in left shoulder: Secondary | ICD-10-CM | POA: Diagnosis not present

## 2017-02-03 DIAGNOSIS — S2241XA Multiple fractures of ribs, right side, initial encounter for closed fracture: Secondary | ICD-10-CM | POA: Diagnosis not present

## 2017-02-03 DIAGNOSIS — R0602 Shortness of breath: Secondary | ICD-10-CM

## 2017-02-03 DIAGNOSIS — R079 Chest pain, unspecified: Secondary | ICD-10-CM | POA: Diagnosis not present

## 2017-02-03 DIAGNOSIS — S4992XA Unspecified injury of left shoulder and upper arm, initial encounter: Secondary | ICD-10-CM | POA: Diagnosis not present

## 2017-02-03 DIAGNOSIS — Z881 Allergy status to other antibiotic agents status: Secondary | ICD-10-CM

## 2017-02-03 DIAGNOSIS — S2242XA Multiple fractures of ribs, left side, initial encounter for closed fracture: Principal | ICD-10-CM | POA: Diagnosis present

## 2017-02-03 DIAGNOSIS — E7800 Pure hypercholesterolemia, unspecified: Secondary | ICD-10-CM | POA: Diagnosis present

## 2017-02-03 DIAGNOSIS — M4802 Spinal stenosis, cervical region: Secondary | ICD-10-CM | POA: Diagnosis not present

## 2017-02-03 DIAGNOSIS — J9811 Atelectasis: Secondary | ICD-10-CM | POA: Diagnosis present

## 2017-02-03 DIAGNOSIS — Z880 Allergy status to penicillin: Secondary | ICD-10-CM

## 2017-02-03 DIAGNOSIS — S0993XA Unspecified injury of face, initial encounter: Secondary | ICD-10-CM | POA: Diagnosis not present

## 2017-02-03 DIAGNOSIS — S0990XA Unspecified injury of head, initial encounter: Secondary | ICD-10-CM | POA: Diagnosis not present

## 2017-02-03 DIAGNOSIS — M109 Gout, unspecified: Secondary | ICD-10-CM | POA: Diagnosis present

## 2017-02-03 DIAGNOSIS — Z85828 Personal history of other malignant neoplasm of skin: Secondary | ICD-10-CM

## 2017-02-03 DIAGNOSIS — E871 Hypo-osmolality and hyponatremia: Secondary | ICD-10-CM | POA: Diagnosis present

## 2017-02-03 DIAGNOSIS — K219 Gastro-esophageal reflux disease without esophagitis: Secondary | ICD-10-CM | POA: Diagnosis present

## 2017-02-03 LAB — BASIC METABOLIC PANEL
Anion gap: 9 (ref 5–15)
BUN: 23 mg/dL — AB (ref 6–20)
CHLORIDE: 99 mmol/L — AB (ref 101–111)
CO2: 25 mmol/L (ref 22–32)
Calcium: 9.2 mg/dL (ref 8.9–10.3)
Creatinine, Ser: 0.95 mg/dL (ref 0.44–1.00)
GFR calc Af Amer: 58 mL/min — ABNORMAL LOW (ref >60)
GFR calc non Af Amer: 50 mL/min — ABNORMAL LOW (ref >60)
GLUCOSE: 123 mg/dL — AB (ref 65–99)
POTASSIUM: 4.1 mmol/L (ref 3.5–5.1)
Sodium: 133 mmol/L — ABNORMAL LOW (ref 135–145)

## 2017-02-03 LAB — CBC WITH DIFFERENTIAL/PLATELET
Basophils Absolute: 0 10*3/uL (ref 0–0.1)
Basophils Relative: 0 %
Eosinophils Absolute: 0 10*3/uL (ref 0–0.7)
Eosinophils Relative: 0 %
HEMATOCRIT: 33.5 % — AB (ref 35.0–47.0)
HEMOGLOBIN: 11.3 g/dL — AB (ref 12.0–16.0)
LYMPHS ABS: 1.2 10*3/uL (ref 1.0–3.6)
LYMPHS PCT: 13 %
MCH: 32.6 pg (ref 26.0–34.0)
MCHC: 33.8 g/dL (ref 32.0–36.0)
MCV: 96.4 fL (ref 80.0–100.0)
Monocytes Absolute: 0.6 10*3/uL (ref 0.2–0.9)
Monocytes Relative: 6 %
NEUTROS PCT: 81 %
Neutro Abs: 7.6 10*3/uL — ABNORMAL HIGH (ref 1.4–6.5)
Platelets: 138 10*3/uL — ABNORMAL LOW (ref 150–440)
RBC: 3.48 MIL/uL — AB (ref 3.80–5.20)
RDW: 14.1 % (ref 11.5–14.5)
WBC: 9.5 10*3/uL (ref 3.6–11.0)

## 2017-02-03 MED ORDER — ONDANSETRON HCL 4 MG/2ML IJ SOLN
4.0000 mg | Freq: Once | INTRAMUSCULAR | Status: AC
  Administered 2017-02-03: 4 mg via INTRAVENOUS
  Filled 2017-02-03: qty 2

## 2017-02-03 MED ORDER — FENTANYL CITRATE (PF) 100 MCG/2ML IJ SOLN
50.0000 ug | Freq: Once | INTRAMUSCULAR | Status: AC
  Administered 2017-02-03: 50 ug via INTRAVENOUS
  Filled 2017-02-03: qty 2

## 2017-02-03 MED ORDER — HYDROCODONE-ACETAMINOPHEN 5-325 MG PO TABS
1.0000 | ORAL_TABLET | Freq: Once | ORAL | Status: AC
  Administered 2017-02-03: 1 via ORAL
  Filled 2017-02-03: qty 1

## 2017-02-03 NOTE — ED Triage Notes (Signed)
Pt tripped over a garden hose and fell on left side and complains of left side and shoulder pain. Patient states she did not have LOC and patient has abrasions on left knee and bruising to left side of the face. Pt states she lives by herself and was only outside for 20 min before the fall.

## 2017-02-03 NOTE — ED Provider Notes (Signed)
[facility] Emergency Department Provider Note  ____________________________________________   First MD Initiated Contact with Patient 02/03/17 2004     (approximate)  I have reviewed the triage vital signs and the nursing notes.   HISTORY  Chief Complaint Fall   HPI Kayla Galloway is a 83 y.o. female with a history of DVT on eliquis who is presenting to the emergency department todayafter tripping fall. She says that she was outside and jumped over a hose when she twisted her left ankle. She says that she hit her head against the ground as well as her left scapula and left ribs. She says that she has had her last tetanus shot 5 years ago. Denies losing consciousness. Denies any pain in her neck at this time. Says that her pain is a 10 out of 10 at this time especially in the left "shoulder" which she describes as under her shoulder blade. Denies any pain to the left knee at this time.   Past Medical History:  Diagnosis Date  . Anemia   . Arthritis   . Cancer ([place])    skin ca lesion of scalp- removed  . Depression   . DVT (deep venous thrombosis), left    bilateral, IVC filter 2010  . GERD (gastroesophageal reflux disease)   . Gout   . Hypercholesterolemia   . Hyperkalemia   . Hypertension    Dr. [name]  . Nephrolithiasis   . Obesity   . Osteoporosis   . Peripheral vascular disease ([place])   . 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 Spider veins     Patient Active Problem List   Diagnosis Date Noted  . Skin lesions 11/09/2016  . Bilateral lower extremity edema 11/04/2016  . Swelling of limb 08/22/2016  . Postphlebitic syndrome without complication 17/00/1749  . S/P IVC filter 06/24/2016  . Swelling of right lower extremity 06/11/2016  . Lesion of neck 03/23/2015  . Laceration of face 02/05/2015  . Chest pain 02/05/2015  .  Hyperkalemia   . Cellulitis and abscess of foot 01/24/2015  . Cellulitis 01/12/2015  . Laceration of left leg 07/24/2014  . Laceration of left upper extremity 07/24/2014  . Weight loss 04/11/2014  . Urinary tract infectious disease 04/06/2014  . Rash, drug 07/18/2013  . Stasis ulcer ([place]) 07/07/2013  . Anemia 06/28/2012  . Hypertension 06/28/2012  . Hypercholesterolemia 06/28/2012  . Renal insufficiency 06/28/2012  . Osteoporosis 06/28/2012    Past Surgical History:  Procedure Laterality Date  . CARDIAC CATHETERIZATION     2010 Does not see a cardiac  doctor  . Colonsopy    . DENTAL SURGERY    . ECTOPIC PREGNANCY SURGERY    . EYE SURGERY Bilateral 2011,2012   cataracts  . FRACTURE SURGERY  2009   hip, rod from knee to hip  . HEMORRHOID SURGERY    . IVC filter Left    pt states it has turned side ways and could not be removed.  Marland Kitchen KYPHOPLASTY  09/03/2012   per patient, she has had kypho x 2:  Surgeon: Winfield Cunas, MD;  Location: [place] [facility];  Service: Neurosurgery;  Laterality: N/A;  Thoracic eight Kyphoplasty  . OPEN REDUCTION INTERNAL FIXATION (ORIF) DISTAL RADIAL FRACTURE Left 09/06/2015   Procedure: OPEN REDUCTION INTERNAL FIXATION (ORIF) DISTAL RADIAL FRACTURE;  Surgeon: Hessie Knows, MD;  [facility];  Service: Orthopedics;  Laterality: Left;  . RIGHT OOPHORECTOMY     partial-  . SKIN CANCER EXCISION     top of head  . TONSILLECTOMY     age 58  . VERTEBROPLASTY  12/31/2011   Procedure: VERTEBROPLASTY;  Surgeon: Winfield Cunas, MD;  Location: [place] [facility];  Service: Neurosurgery;  Laterality: N/A;  Thoracic eleven vertebroplasty    Prior to Admission medications   Medication Sig Start Date End Date Taking? Authorizing Provider  acetaminophen (TYLENOL) 650 MG CR tablet Take 650 mg by mouth every 8 (eight) hours as needed for pain.     [provider]  apixaban (ELIQUIS) 5 MG TABS tablet Take 1 tablet (5 mg total) by mouth 2 (two) times daily. 01/26/15    Hillary Bow, MD  bifidobacterium infantis (ALIGN) capsule Take 1 capsule by mouth daily. 05/22/14   [name], MD  Calcium Carb-Cholecalciferol (CALCIUM 600 + D PO) Take 1 tablet by mouth 3 (three) times daily.    [provider]  furosemide (LASIX) 20 MG tablet TAKE 1 TABLET BY MOUTH ONCE DAILY 01/02/17   [name], MD  Glucosamine-Chondroitin 250-200 MG TABS Take by mouth.    [provider]  hydrocerin (EUCERIN) CREA Apply 1 application topically 2 (two) times daily. 01/15/15   Henreitta Leber, MD  hydrOXYzine (ATARAX/VISTARIL) 10 MG tablet TAKE 2 & 1/2 TABLETS BY MOUTH 3 TIMES DAILY AS NEEDED ITCHING 08/29/16   [name], MD  Misc Natural Products (OSTEO BI-FLEX JOINT SHIELD PO) Take 1 tablet by mouth 2 (two) times daily.    [provider]  mupirocin ointment (BACTROBAN) 2 % Apply to affected area bid 06/11/16   [name], MD  omeprazole (PRILOSEC) 20 MG capsule Take 1 capsule by mouth  daily 09/03/15   [name], MD  ramipril (ALTACE) 5 MG capsule Take 1 capsule by mouth  daily 09/03/15   [name], MD  sertraline (ZOLOFT) 50 MG tablet Take 1 tablet by mouth  daily 09/03/15   [name], MD  triamcinolone cream (KENALOG) 0.5 % Apply 1 application topically 2 (two) times daily. 11/04/16   [name], MD    Allergies Cefuroxime axetil; Doxycycline; Advil [ibuprofen]; Celebrex [celecoxib]; Daypro [oxaprozin]; Etodolac; Macrobid [nitrofurantoin monohyd macro]; Penicillins; Percocet [oxycodone-acetaminophen]; Tramadol; Valium [diazepam]; Neosporin [neomycin-bacitracin zn-polymyx]; and Vicodin [hydrocodone-acetaminophen]  Family History  Problem Relation Age of Onset  . Heart disease Father        myocardial infarction  . Heart disease Brother        myocardial infarction  . Lymphoma Sister   . Anesthesia problems Neg Hx   . Breast cancer Neg Hx   . Colon cancer Neg Hx     Social History Social History  Substance  Use Topics  . Smoking status: Never Smoker  . Smokeless tobacco: Never Used  . Alcohol use No    Review of Systems  Constitutional: No fever/chills Eyes: No visual changes. ENT: No sore throat. Cardiovascular: Denies chest pain. Respiratory: Denies shortness of breath. Gastrointestinal: No abdominal pain.  No nausea, no vomiting.  No diarrhea.  No constipation. Genitourinary: Negative for dysuria. Musculoskeletal: Negative for back pain. Skin: Negative for rash. Neurological: Negative for headaches, focal weakness or numbness.   ____________________________________________   PHYSICAL EXAM:  VITAL SIGNS: ED Triage Vitals  Enc Vitals Group     BP 02/03/17 1938 (!) 189/83     Pulse Rate 02/03/17 1938 86     Resp 02/03/17 1938  18     Temp 02/03/17 1938 97.6 F (36.4 C)     Temp Source 02/03/17 1938 Oral     SpO2 02/03/17 1938 98 %     Weight 02/03/17 1940 123 lb (55.8 kg)     Height 02/03/17 1940 5\' 5"  (1.651 m)     Head Circumference --      Peak Flow --      Pain Score 02/03/17 1938 8     Pain Loc --      Pain Edu? --      Excl. in [place]? --     Constitutional: Alert and oriented. Well appearing and in no acute distress. Eyes: Conjunctivae are normal.  Head: Abrasions overlying the left periorbital region with there is also a 3 cinnamon a laceration just lateral to the left eyebrow which is superficial and without any active bleeding at this time. Approximates well and is linear and down to the mucosal layer of the skin. Nose: No congestion/rhinnorhea. Mouth/Throat: Mucous membranes are moist.  Neck: No stridor.  No tenderness to the cervical spine. No deformity or step-off. Cardiovascular: Normal rate, regular rhythm. Grossly normal heart sounds.   Respiratory: Normal respiratory effort.  No retractions. Lungs CTAB. Gastrointestinal: Soft and nontender. No distention. No CVA tenderness. Musculoskeletal: No tenderness to the bilateral hips. 55 strength to bilateral  hips with flexion. Small, once in demeanor, ovoid abrasion overlying the left anterior knee without any tenderness to palpation. Tenderness anteriorly to the left ankle over the tibiotalar joint. However, there is no deformity the patient is able to fully range her left foot at the ankle.  Also with tenderness palpation laterally to the left thorax without any bruising, step-off or deformity. Range left shoulder fully without any restriction range of motion but with tenderness to palpation over the left scapula.  Neurologic:  Normal speech and language. No gross focal neurologic deficits are appreciated. Skin:  Skin is warm, dry and intact. No rash noted. Psychiatric: Mood and affect are normal. Speech and behavior are normal.  ____________________________________________   LABS (all labs ordered are listed, but only abnormal results are displayed)  Labs Reviewed  CBC WITH DIFFERENTIAL/PLATELET - Abnormal; Notable for the following:       Result Value   RBC 3.48 (*)    Hemoglobin 11.3 (*)    HCT 33.5 (*)    Platelets 138 (*)    Neutro Abs 7.6 (*)    All other components within normal limits  BASIC METABOLIC PANEL - Abnormal; Notable for the following:    Sodium 133 (*)    Chloride 99 (*)    Glucose, Bld 123 (*)    BUN 23 (*)    GFR calc non Af Amer 50 (*)    GFR calc Af Amer 58 (*)    All other components within normal limits   ____________________________________________  EKG   ____________________________________________  RADIOLOGY  Mildly displaced fracture of the left sixth and eighth ribs ____________________________________________   PROCEDURES  Procedure(s) performed:   LACERATION REPAIR Performed by: Doran Stabler Authorized by: Doran Stabler Consent: Verbal consent obtained. Risks and benefits: risks, benefits and alternatives were discussed Consent given by: patient Patient identity confirmed: provided demographic data Prepped and Draped in  normal sterile fashion Wound explored  Laceration Location: left lateral forehead  Laceration Length: 3cm  No Foreign Bodies seen or palpated    Irrigation method: syringe Amount of cleaning: standard  Skin closure: dermabond covered with steri  strips Patient tolerance: Patient tolerated the procedure well with no immediate complications.   Procedures  Critical Care performed:   ____________________________________________   INITIAL IMPRESSION / ASSESSMENT AND PLAN / ED COURSE  Pertinent labs & imaging results that were available during my care of the patient were reviewed by me and considered in my medical decision making (see chart for details).  ----------------------------------------- 11:18 PM on 02/03/2017 -----------------------------------------  Patient with persistent pain after Norco. Unable to be moved and unable to walk to the commode. We will give morphine and Zofran. We will then try again to ambulate.    ----------------------------------------- 11:50 PM on 02/03/2017 -----------------------------------------  Despite morphine and Norco the patient is still unable to sit up in bed due to pain. She'll be admitted to the hospital for pain control. Signed out to Dr. Jannifer Franklin.  ____________________________________________   FINAL CLINICAL IMPRESSION(S) / ED DIAGNOSES  Left-sided rib fractures.    NEW MEDICATIONS STARTED DURING THIS VISIT:  New Prescriptions   No medications on file     Note:  This document was prepared using Dragon voice recognition software and may include unintentional dictation errors.     Orbie Pyo, MD 02/03/17 2350

## 2017-02-04 ENCOUNTER — Encounter: Payer: Self-pay | Admitting: Neurology

## 2017-02-04 DIAGNOSIS — K219 Gastro-esophageal reflux disease without esophagitis: Secondary | ICD-10-CM | POA: Diagnosis not present

## 2017-02-04 DIAGNOSIS — S2239XA Fracture of one rib, unspecified side, initial encounter for closed fracture: Secondary | ICD-10-CM | POA: Diagnosis present

## 2017-02-04 DIAGNOSIS — I10 Essential (primary) hypertension: Secondary | ICD-10-CM | POA: Diagnosis not present

## 2017-02-04 DIAGNOSIS — S2249XA Multiple fractures of ribs, unspecified side, initial encounter for closed fracture: Secondary | ICD-10-CM | POA: Diagnosis present

## 2017-02-04 LAB — BASIC METABOLIC PANEL
ANION GAP: 6 (ref 5–15)
BUN: 19 mg/dL (ref 6–20)
CO2: 28 mmol/L (ref 22–32)
Calcium: 9 mg/dL (ref 8.9–10.3)
Chloride: 98 mmol/L — ABNORMAL LOW (ref 101–111)
Creatinine, Ser: 0.93 mg/dL (ref 0.44–1.00)
GFR calc Af Amer: 60 mL/min — ABNORMAL LOW (ref >60)
GFR, EST NON AFRICAN AMERICAN: 51 mL/min — AB (ref >60)
GLUCOSE: 124 mg/dL — AB (ref 65–99)
Potassium: 4.2 mmol/L (ref 3.5–5.1)
Sodium: 132 mmol/L — ABNORMAL LOW (ref 135–145)

## 2017-02-04 LAB — CBC
HCT: 34.2 % — ABNORMAL LOW (ref 35.0–47.0)
HEMOGLOBIN: 11.6 g/dL — AB (ref 12.0–16.0)
MCH: 32.5 pg (ref 26.0–34.0)
MCHC: 33.9 g/dL (ref 32.0–36.0)
MCV: 96 fL (ref 80.0–100.0)
PLATELETS: 137 10*3/uL — AB (ref 150–440)
RBC: 3.56 MIL/uL — ABNORMAL LOW (ref 3.80–5.20)
RDW: 14.2 % (ref 11.5–14.5)
WBC: 6.7 10*3/uL (ref 3.6–11.0)

## 2017-02-04 MED ORDER — APIXABAN 5 MG PO TABS
5.0000 mg | ORAL_TABLET | Freq: Two times a day (BID) | ORAL | Status: DC
  Filled 2017-02-04: qty 1

## 2017-02-04 MED ORDER — MORPHINE SULFATE (PF) 2 MG/ML IV SOLN
INTRAVENOUS | Status: AC
  Filled 2017-02-04: qty 1

## 2017-02-04 MED ORDER — KETOROLAC TROMETHAMINE 30 MG/ML IJ SOLN
30.0000 mg | Freq: Once | INTRAMUSCULAR | Status: AC
  Administered 2017-02-04: 30 mg via INTRAVENOUS
  Filled 2017-02-04: qty 1

## 2017-02-04 MED ORDER — HYDROCODONE-ACETAMINOPHEN 5-325 MG PO TABS
1.0000 | ORAL_TABLET | ORAL | Status: DC | PRN
  Administered 2017-02-04 – 2017-02-05 (×6): 1 via ORAL
  Filled 2017-02-04 (×6): qty 1

## 2017-02-04 MED ORDER — RAMIPRIL 5 MG PO CAPS
5.0000 mg | ORAL_CAPSULE | Freq: Every day | ORAL | Status: DC
  Administered 2017-02-04 – 2017-02-09 (×6): 5 mg via ORAL
  Filled 2017-02-04 (×7): qty 1

## 2017-02-04 MED ORDER — HYDROXYZINE HCL 25 MG PO TABS
25.0000 mg | ORAL_TABLET | Freq: Three times a day (TID) | ORAL | Status: DC | PRN
  Filled 2017-02-04: qty 1

## 2017-02-04 MED ORDER — ONDANSETRON HCL 4 MG/2ML IJ SOLN
4.0000 mg | Freq: Four times a day (QID) | INTRAMUSCULAR | Status: DC | PRN
  Administered 2017-02-04 – 2017-02-06 (×2): 4 mg via INTRAVENOUS
  Filled 2017-02-04: qty 2

## 2017-02-04 MED ORDER — ONDANSETRON HCL 4 MG PO TABS
4.0000 mg | ORAL_TABLET | Freq: Four times a day (QID) | ORAL | Status: DC | PRN
  Filled 2017-02-04: qty 1

## 2017-02-04 MED ORDER — ONDANSETRON HCL 4 MG/2ML IJ SOLN
INTRAMUSCULAR | Status: AC
  Administered 2017-02-06: 4 mg via INTRAVENOUS
  Filled 2017-02-04: qty 2

## 2017-02-04 MED ORDER — SERTRALINE HCL 50 MG PO TABS
50.0000 mg | ORAL_TABLET | Freq: Every day | ORAL | Status: DC
  Administered 2017-02-04 – 2017-02-10 (×7): 50 mg via ORAL
  Filled 2017-02-04 (×7): qty 1

## 2017-02-04 MED ORDER — ORAL CARE MOUTH RINSE
15.0000 mL | Freq: Two times a day (BID) | OROMUCOSAL | Status: DC
  Administered 2017-02-04 – 2017-02-08 (×9): 15 mL via OROMUCOSAL

## 2017-02-04 MED ORDER — MORPHINE SULFATE (PF) 2 MG/ML IV SOLN
2.0000 mg | INTRAVENOUS | Status: DC | PRN
  Administered 2017-02-04: 2 mg via INTRAVENOUS

## 2017-02-04 MED ORDER — PANTOPRAZOLE SODIUM 40 MG PO TBEC
40.0000 mg | DELAYED_RELEASE_TABLET | Freq: Every day | ORAL | Status: DC
  Administered 2017-02-04 – 2017-02-10 (×7): 40 mg via ORAL
  Filled 2017-02-04 (×7): qty 1

## 2017-02-04 MED ORDER — KETOROLAC TROMETHAMINE 15 MG/ML IJ SOLN
15.0000 mg | Freq: Three times a day (TID) | INTRAMUSCULAR | Status: DC | PRN
  Administered 2017-02-04 – 2017-02-07 (×3): 15 mg via INTRAVENOUS
  Filled 2017-02-04 (×3): qty 1

## 2017-02-04 NOTE — Evaluation (Signed)
Clinical/Bedside Swallow Evaluation Patient Details  Name: Kayla Galloway MRN: [id number] Date of Birth: 1921/01/08  Today's Date: 02/04/2017 Time: SLP Start Time (ACUTE ONLY): 1330 SLP Stop Time (ACUTE ONLY): 1430 SLP Time Calculation (min) (ACUTE ONLY): 60 min  Past Medical History:  Past Medical History:  Diagnosis Date  . Anemia   . Arthritis   . Cancer ([place])    skin ca lesion of scalp- removed  . Depression   . DVT (deep venous thrombosis), left    bilateral, IVC filter 2010  . GERD (gastroesophageal reflux disease)   . Gout   . Hypercholesterolemia   . Hyperkalemia   . Hypertension    Dr. [name]  . Nephrolithiasis   . Obesity   . Osteoporosis   . Peripheral vascular disease ([place])   . 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 Spider veins    Past Surgical History:  Past Surgical History:  Procedure Laterality Date  . CARDIAC CATHETERIZATION     2010 Does not see a cardiac  doctor  . Colonsopy    . DENTAL SURGERY    . ECTOPIC PREGNANCY SURGERY    . EYE SURGERY Bilateral 2011,2012   cataracts  . FRACTURE SURGERY  2009   hip, rod from knee to hip  . HEMORRHOID SURGERY    . IVC filter Left    pt states it has turned side ways and could not be removed.  Marland Kitchen KYPHOPLASTY  09/03/2012   per patient, she has had kypho x 2:  Surgeon: Winfield Cunas, MD;  Location: [facility];  Service: Neurosurgery;  Laterality: N/A;  Thoracic eight Kyphoplasty  . OPEN REDUCTION INTERNAL FIXATION (ORIF) DISTAL RADIAL FRACTURE Left 09/06/2015   Procedure: OPEN REDUCTION INTERNAL FIXATION (ORIF) DISTAL RADIAL FRACTURE;  Surgeon: Hessie Knows, MD;  Location: [facility];  Service: Orthopedics;  Laterality: Left;  . RIGHT OOPHORECTOMY     partial-  . SKIN CANCER EXCISION     top of head  . TONSILLECTOMY     age 30  . VERTEBROPLASTY  12/31/2011   Procedure: VERTEBROPLASTY;  Surgeon:  Winfield Cunas, MD;  Location: [facility];  Service: Neurosurgery;  Laterality: N/A;  Thoracic eleven vertebroplasty   HPI:  Pt  is a 83 y.o. female w/ multiple medical issues including GERD who presents with Fall and subsequent rib fractures. Patient had a mechanical fall she was trying to step over a garden hose. She has 6 and 8 minimally displaced rib fractures; also pain in leg and knee. Her pain is significantly difficult to control in the ED. Hospitalists were called for admission. Pt Lived at home by herself prior to the fall. Pt is verbally conversive and follows commands adequately. Oriented x3; conversed w/ Sister and SLP but tended to repeat herself intermittently(Sister stated this was occurring at home as well).   Assessment / Plan / Recommendation Clinical Impression  Pt appears at reduced risk for aspiration when following general aspiration precautions; no apparent oropharyngeal phase dysphagia noted when eating/drinking w/ general precautions in place. Pt exhibited adequate oral phase management of boluses and no overt s/s of aspiration were noted w/ trials of thin liquids VIA CUP - no straw used. Pt fed self w/ setup assist. Pt was educated on aspiration precautions and used these during her meal - most importantly, taking small, single sips VIA CUP. Suspect pt  could have some degree of Esophageal phase dysmotility per description of s/s by pt and by Sister. Gave general education on Reflux precautions. Recommend a mech soft diet w/thin liquids; pills in Puree for easier swallowing; tray setup at meals.   SLP Visit Diagnosis: Dysphagia, pharyngoesophageal phase (R13.14)    Aspiration Risk   (reduced w/ precautions)    Diet Recommendation  Dysphagia level 3 w/ thin liquids; aspiration precautions including NO STRAWS!  General Reflux precautions  Medication Administration: Whole meds with puree    Other  Recommendations Recommended Consults: Consider GI evaluation (Dietician  consult) Oral Care Recommendations: Oral care BID;Staff/trained caregiver to provide oral care;Patient independent with oral care   Follow up Recommendations None      Frequency and Duration min 2x/week  1 week       Prognosis Prognosis for Safe Diet Advancement: Fair (-Good) Barriers to Reach Goals: Cognitive deficits (min) Barriers/Prognosis Comment: possible Esophageal dysmotility      Swallow Study   General Date of Onset: 02/03/17 HPI: Pt  is a 83 y.o. female w/ multiple medical issues including GERD who presents with Fall and subsequent rib fractures. Patient had a mechanical fall she was trying to step over a garden hose. She has 6 and 8 minimally displaced rib fractures; also pain in leg and knee. Her pain is significantly difficult to control in the ED. Hospitalists were called for admission. Pt Lived at home by herself prior to the fall. Pt is verbally conversive and follows commands adequately. Oriented x3; conversed w/ Sister and SLP but tended to repeat herself intermittently(Sister stated this was occurring at home as well). Type of Study: Bedside Swallow Evaluation Previous Swallow Assessment: none Diet Prior to this Study: Regular;Thin liquids Temperature Spikes Noted: No (wbc not elevated) Respiratory Status: Nasal cannula (2 liters) History of Recent Intubation: No Behavior/Cognition: Alert;Cooperative;Pleasant mood;Distractible;Requires cueing Oral Cavity Assessment: Within Functional Limits Oral Care Completed by SLP: Recent completion by staff Oral Cavity - Dentition: Adequate natural dentition (w/ partials) Vision: Functional for self-feeding Self-Feeding Abilities: Able to feed self;Needs assist;Needs set up (min) Patient Positioning: Upright in bed Baseline Vocal Quality: Normal Volitional Cough: Strong Volitional Swallow: Able to elicit    Oral/Motor/Sensory Function Overall Oral Motor/Sensory Function: Within functional limits   Ice Chips Ice chips:  Within functional limits Presentation: Spoon (fed; 2 trials)   Thin Liquid Thin Liquid: Within functional limits Presentation: Cup;Self Fed (~3-4 ozs total) Other Comments: then drank coffee via mug    Nectar Thick Nectar Thick Liquid: Not tested   Honey Thick Honey Thick Liquid: Not tested   Puree Puree: Within functional limits Presentation: Self Fed;Spoon (4 ozs)   Solid   GO   Solid: Within functional limits (mech soft food) Presentation: Self Fed;Spoon (4 ozs)    Functional Assessment Tool Used: clincial judgement Functional Limitations: Swallowing Swallow Current Status (E5631): At least 1 percent but less than 20 percent impaired, limited or restricted Swallow Goal Status [phone]): At least 1 percent but less than 20 percent impaired, limited or restricted Swallow Discharge Status [phone]): At least 1 percent but less than 20 percent impaired, limited or restricted     Kayla Kenner, MS, CCC-SLP Mell Mellott 02/04/2017,3:04 PM

## 2017-02-04 NOTE — Evaluation (Signed)
Physical Therapy Evaluation Patient Details Name: Kayla Galloway MRN: [id number] DOB: [birth year] Today's Date: 02/04/2017   History of Present Illness  Pt is a 83 yo F admitted to acute care with L 6 and 8 rib fx after undergoing a fall. Prior to admission, pt at modI level, using RW for ambulation. PMH includes: anemia, arthritis, hx of DVT, hx of obesity, cancer, depression, GERD, spider veins, Gout, hypercholesterolemia, hyperkalemia, HTN, OP, PVD, renal vein thrombosis, and retroperitoneal bleed.   Clinical Impression  Pt is a pleasant 83 y.o. F, admitted to acute care for L 6 and 8th rib fracture after fall. Pt found supine in bed on 2L supplemental O2, with family present in the room; pt highly motivated to participate. Pt performs bed mobility with CGA to the L side, secondary to generalized weakness and increased pain with mobility. Tranfers and ambulation performed with CGA to minA with RW, gradually improving with multiple reps. Pt required minA with turns and obstacle navigation, needing slight assistance for management of RW and requiring min verbal cues to maintain close proximity to RW. However, pt followed cues well and demonstrated good endurance levels for in home navigation. Pt able to amb a total of 200 ft with a gait speed of 1.0 ft per second, indicating high risk for falls with community amb. Pt able to perform supine therex, transfers, and amb on room air, while maintaining O2sats at 89%, with no signs of distress. Following therapy, pt left in recliner, on room air, with call bell and nursing was notified of pt status (including removal of O2). Pt presents with the following deficits: strength, balance, and pain. Overall, pt responded well to today's treatment with no adverse affects. Pt would benefit from skilled PT to address the previously mentioned impairments and promote return to PLOF. Currently recommending home health physical therapy with supervision for mobility/OOB from  family/friends. During this treatment session, the therapist was present, participating in and directing the treatment.      Follow Up Recommendations Home health PT;Supervision for mobility/OOB    Equipment Recommendations  None recommended by PT    Recommendations for Other Services       Precautions / Restrictions Precautions Precautions: None Restrictions Weight Bearing Restrictions: No      Mobility  Bed Mobility Overal bed mobility: Needs Assistance Bed Mobility: Supine to Sit     Supine to sit: Min guard     General bed mobility comments: Pt requires min guard for supine to sit, secondary to impaired strength and increased levels of pain with functional mobility. Required use of bed rails and increased time to achieve seated position at EOB.  Transfers Overall transfer level: Needs assistance Equipment used: Rolling walker (2 wheeled) Transfers: Sit to/from Stand Sit to Stand: Min guard;Min assist         General transfer comment: Pt min guard - minA with STS transfers due to impaired LE strength and increased pain with functional mobility. Required min verbal cues for correct hand placement.   Ambulation/Gait Ambulation/Gait assistance: Min assist;Min guard Ambulation Distance (Feet): 190 Feet Assistive device: Rolling walker (2 wheeled) Gait Pattern/deviations: Decreased step length - right;Decreased step length - left;Step-through pattern Gait velocity: 1.0 ft/sec Gait velocity interpretation: <1.8 ft/sec, indicative of risk for recurrent falls General Gait Details: Requires CGA with amb, which increases to minA with turns and obstacle navigation, secondary to impaired strength, balance, and safety awareness. During turns, pt required min verbal/tactile cues to maintain good proximity and navigation of the  RW.   Stairs            Wheelchair Mobility    Modified Rankin (Stroke Patients Only)       Balance Overall balance assessment: Needs  assistance Sitting-balance support: Bilateral upper extremity supported;Feet supported Sitting balance-Leahy Scale: Good Sitting balance - Comments: Requires supervision for sitting balance, secondary to generalized weakness and increased levels of pain with moving from supine to sit.    Standing balance support: Bilateral upper extremity supported Standing balance-Leahy Scale: Good Standing balance comment: CGA for static standing balance with B UE support, due to generalized LE weakness and increased levels of pain with coming to stand.                              Pertinent Vitals/Pain Pain Assessment: Faces Faces Pain Scale: Hurts little more Pain Location: L LE and ribs Pain Descriptors / Indicators: Cramping;Discomfort;Aching Pain Intervention(s): Limited activity within patient's tolerance;Monitored during session    Fisher expects to be discharged to:: Private residence Living Arrangements: Alone Available Help at Discharge: Family;Friend(s) (though not available 24/7) Type of Home: House Home Access: Stairs to enter Entrance Stairs-Rails: Left Entrance Stairs-Number of Steps: 2 (with threshold to enter) Home Layout: One level Home Equipment: Walker - 2 wheels;Walker - standard Additional Comments: has threshold to enter after 2 stairs.     Prior Function Level of Independence: Independent with assistive device(s)         Comments: ModI, requiring RW for amb. Pt inconsistent with use of AD, per son's report.      Hand Dominance        Extremity/Trunk Assessment   Upper Extremity Assessment Upper Extremity Assessment: Overall WFL for tasks assessed    Lower Extremity Assessment Lower Extremity Assessment: Generalized weakness (MMT: B LE's grossly 4/5)       Communication   Communication: No difficulties  Cognition Arousal/Alertness: Awake/alert Behavior During Therapy: WFL for tasks assessed/performed Overall Cognitive  Status: Within Functional Limits for tasks assessed                                        General Comments      Exercises Other Exercises Other Exercises: Supine therex to B LE's with supervision x10 reps: ankle pumps, SLR, and hip abd. required no verbal cues to maintain body mechanics and followed instructions well.    Assessment/Plan    PT Assessment Patient needs continued PT services  PT Problem List Decreased strength;Decreased activity tolerance;Decreased balance;Decreased mobility;Decreased safety awareness;Pain       PT Treatment Interventions DME instruction;Gait training;Stair training;Functional mobility training;Therapeutic activities;Balance training;Therapeutic exercise;Neuromuscular re-education;Patient/family education    PT Goals (Current goals can be found in the Care Plan section)  Acute Rehab PT Goals Patient Stated Goal: To walk around.  PT Goal Formulation: With patient Time For Goal Achievement: 02/18/17 Potential to Achieve Goals: Good Additional Goals Additional Goal #1: Pt to report no greater than 1/10 pain after ambulating >150 for return to PLOF.     Frequency 7X/week   Barriers to discharge        Co-evaluation               AM-PAC PT "6 Clicks" Daily Activity  Outcome Measure Difficulty turning over in bed (including adjusting bedclothes, sheets and blankets)?: Total Difficulty moving from lying on back  to sitting on the side of the bed? : Total Difficulty sitting down on and standing up from a chair with arms (e.g., wheelchair, bedside commode, etc,.)?: Total Help needed moving to and from a bed to chair (including a wheelchair)?: A Little Help needed walking in hospital room?: A Little Help needed climbing 3-5 steps with a railing? : A Lot 6 Click Score: 11    End of Session   Activity Tolerance: Patient tolerated treatment well Patient left: in chair;with call bell/phone within reach;with chair alarm set;with  family/visitor present Nurse Communication: Mobility status PT Visit Diagnosis: Unsteadiness on feet (R26.81);Other abnormalities of gait and mobility (R26.89);Muscle weakness (generalized) (M62.81);History of falling (Z91.81);Pain Pain - Right/Left: Left Pain - part of body: Shoulder;Leg (L 6 and 8 ribs)    Time: 7121-9758 PT Time Calculation (min) (ACUTE ONLY): 26 min   Charges:         PT G Codes:        Oran Rein PT, SPT   Kayla Galloway 02/04/2017, 3:38 PM

## 2017-02-04 NOTE — Progress Notes (Signed)
[place] at [facility]                                                                                                                                                                                  Patient Demographics   Kayla Galloway, is a 83 y.o. female, DOB - 04-02-1921, XHB:[id number]  Admit date - 02/03/2017   Admitting Physician Lance Coon, MD  Outpatient Primary MD for the patient is [name], MD   LOS - 0  Subjective: Patient continues to have significant pain in her ribs. Also complains of pain in the left leg and the knee.    Review of Systems:   CONSTITUTIONAL: No documented fever. No fatigue, weakness. No weight gain, no weight loss.  EYES: No blurry or double vision.  ENT: No tinnitus. No postnasal drip. No redness of the oropharynx.  RESPIRATORY: No cough, no wheeze, no hemoptysis. No dyspnea.  CARDIOVASCULAR: No chest pain. No orthopnea. No palpitations. No syncope.  GASTROINTESTINAL: No nausea, no vomiting or diarrhea. No abdominal pain. No melena or hematochezia.  GENITOURINARY: No dysuria or hematuria.  ENDOCRINE: No polyuria or nocturia. No heat or cold intolerance.  HEMATOLOGY: No anemia. No bruising. No bleeding.  INTEGUMENTARY: No rashes. No lesions.  MUSCULOSKELETAL: Positive rib pain as well as left leg and foot pain NEUROLOGIC: No numbness, tingling, or ataxia. No seizure-type activity.  PSYCHIATRIC: No anxiety. No insomnia. No ADD.    Vitals:   Vitals:   02/04/17 0030 02/04/17 0202 02/04/17 0307 02/04/17 0718  BP: (!) 175/78 (!) 181/76 (!) 189/80 132/74  Pulse: 86 96 87 74  Resp:  18 18   Temp:   97.7 F (36.5 C) 97.7 F (36.5 C)  TempSrc:   Axillary Oral  SpO2: (!) 87% 93% 99% 100%  Weight:   126 lb 9.6 oz (57.4 kg)   Height:        Wt Readings from Last 3 Encounters:  02/04/17 126 lb 9.6 oz (57.4 kg)  12/30/16 127 lb (57.6 kg)  12/09/16 125 lb (56.7 kg)    No intake or output data in the 24  hours ending 02/04/17 1410  Physical Exam:   GENERAL: Pleasant-appearing in no apparent distress.  HEAD, EYES, EARS, NOSE AND THROAT: Atraumatic, normocephalic. Extraocular muscles are intact. Pupils equal and reactive to light. Sclerae anicteric. No conjunctival injection. No oro-pharyngeal erythema.  NECK: Supple. There is no jugular venous distention. No bruits, no lymphadenopathy, no thyromegaly.  HEART: Regular rate and rhythm,. No murmurs, no rubs, no clicks.  LUNGS: Clear to auscultation bilaterally. No rales or rhonchi. No wheezes.  ABDOMEN: Soft, flat, nontender, nondistended. Has good bowel sounds. No  hepatosplenomegaly appreciated.  EXTREMITIES: No evidence of any cyanosis, clubbing, or peripheral edema.  +2 pedal and radial pulses bilaterally.  NEUROLOGIC: The patient is alert, awake, and oriented x3 with no focal motor or sensory deficits appreciated bilaterally.  SKIN: Moist and warm with no rashes appreciated.  Psych: Not anxious, depressed LN: No inguinal LN enlargement    Antibiotics   Anti-infectives    None      Medications   Scheduled Meds: . mouth rinse  15 mL Mouth Rinse BID  . pantoprazole  40 mg Oral Daily  . ramipril  5 mg Oral Daily  . sertraline  50 mg Oral Daily   Continuous Infusions: PRN Meds:.HYDROcodone-acetaminophen, hydrOXYzine, morphine injection, ondansetron **OR** ondansetron (ZOFRAN) IV   Data Review:   Micro Results No results found for this or any previous visit (from the past 240 hour(s)).  Radiology Reports Dg Ribs Unilateral W/chest Left  Result Date: 02/03/2017 CLINICAL DATA:  83 y/o F; status post fall with left-sided rib pain. EXAM: LEFT RIBS AND CHEST - 3+ VIEW COMPARISON:  None. FINDINGS: Mildly displaced fractures of the left sixth and eighth ribs. No additional appreciable rib fracture identified. IMPRESSION: Mildly displaced fractures of the left sixth and eighth ribs. No additional appreciable rib fracture identified.  Electronically Signed   By: Kristine Garbe M.D.   On: 02/03/2017 20:56   Dg Scapula Left  Result Date: 02/03/2017 CLINICAL DATA:  Tripped over garden hose and fell on left side. Left shoulder pain. Concern for scapular injury. Initial encounter. EXAM: LEFT SCAPULA - 2+ VIEWS COMPARISON:  Left shoulder radiographs performed earlier today at 8:16 p.m. FINDINGS: The left scapula appears grossly intact. The left humeral head remains seated at the glenoid fossa. Mild degenerative change is noted at the left acromioclavicular joint. The previously noted mildly displaced left lateral eighth rib fracture is not imaged on this study. IMPRESSION: No evidence of fracture or dislocation. Left scapula appears intact. Electronically Signed   By: Garald Balding M.D.   On: 02/03/2017 22:13   Dg Ankle Complete Left  Result Date: 02/03/2017 CLINICAL DATA:  Tripped over garden hose and fell on left side. Left ankle pain. Initial encounter. EXAM: LEFT ANKLE COMPLETE - 3+ VIEW COMPARISON:  Left tibia/fibula radiographs performed 01/12/2015 FINDINGS: There is no evidence of fracture or dislocation. The ankle mortise is intact; the interosseous space is within normal limits. No talar tilt or subluxation is seen. The joint spaces are preserved. Mild medial soft tissue swelling is noted. Diffuse vascular calcifications are seen. IMPRESSION: 1. No evidence of fracture or dislocation. 2. Diffuse vascular calcifications seen. Electronically Signed   By: Garald Balding M.D.   On: 02/03/2017 22:18   Ct Head Wo Contrast  Result Date: 02/03/2017 CLINICAL DATA:  Tripped over a garden hose and fell onto LEFT side, complaining of LEFT-sided shoulder pain, bruising to LEFT side of face, history hypertension EXAM: CT HEAD WITHOUT CONTRAST CT CERVICAL SPINE WITHOUT CONTRAST TECHNIQUE: Multidetector CT imaging of the head and cervical spine was performed following the standard protocol without intravenous contrast. Multiplanar CT  image reconstructions of the cervical spine were also generated. COMPARISON:  CT head 09/02/2015 FINDINGS: CT HEAD FINDINGS Brain: Generalized atrophy. Normal ventricular morphology. No midline shift or mass effect. Small vessel chronic ischemic changes of deep cerebral white matter. No intracranial hemorrhage, mass lesion, evidence of acute infarction, or extra-axial fluid collection. Vascular: Atherosclerotic calcifications of the internal carotid and vertebral arteries at skullbase. Skull: Intact Sinuses/Orbits: Chronic mucosal thickening  and partial opacity of the LEFT maxillary sinus with osseous wall thickening. Remaining paranasal sinuses and mastoid air cells clear Other: N/A CT CERVICAL SPINE FINDINGS Alignment: 3.3 mm of anterolisthesis at C3-C4. Less than 2 mm of retrolisthesis at C4-C5, C5-C6, and C6-C7. Skull base and vertebrae: Visualized skullbase intact. Vertebral body heights maintained. No fracture or bone destruction. Mild scattered facet degenerative changes cervical spine. Soft tissues and spinal canal: Prevertebral soft tissues normal thickness. Atherosclerotic calcifications at the carotid bifurcations bilaterally and at the great vessels in the lower cervical region. Disc levels: Multilevel disc space narrowing and scattered endplate spurs. Scattered bony neural foraminal narrowing. Upper chest: Biapical lung scarring. Other: Degenerative changes BILATERAL temporomandibular joints. IMPRESSION: Atrophy with small-vessel chronic ischemic changes of deep cerebral white matter. No acute intracranial abnormalities. Chronic LEFT maxillary sinus disease. Osseous demineralization with degenerative disc and facet disease changes of the cervical spine associated with 3.3 mm of anterolisthesis at C3-C4 and mild degrees of retrolisthesis at C4-C5 through C6-C7. No acute fractures identified. Electronically Signed   By: Lavonia Dana M.D.   On: 02/03/2017 20:19   Ct Cervical Spine Wo Contrast  Result  Date: 02/03/2017 CLINICAL DATA:  Tripped over a garden hose and fell onto LEFT side, complaining of LEFT-sided shoulder pain, bruising to LEFT side of face, history hypertension EXAM: CT HEAD WITHOUT CONTRAST CT CERVICAL SPINE WITHOUT CONTRAST TECHNIQUE: Multidetector CT imaging of the head and cervical spine was performed following the standard protocol without intravenous contrast. Multiplanar CT image reconstructions of the cervical spine were also generated. COMPARISON:  CT head 09/02/2015 FINDINGS: CT HEAD FINDINGS Brain: Generalized atrophy. Normal ventricular morphology. No midline shift or mass effect. Small vessel chronic ischemic changes of deep cerebral white matter. No intracranial hemorrhage, mass lesion, evidence of acute infarction, or extra-axial fluid collection. Vascular: Atherosclerotic calcifications of the internal carotid and vertebral arteries at skullbase. Skull: Intact Sinuses/Orbits: Chronic mucosal thickening and partial opacity of the LEFT maxillary sinus with osseous wall thickening. Remaining paranasal sinuses and mastoid air cells clear Other: N/A CT CERVICAL SPINE FINDINGS Alignment: 3.3 mm of anterolisthesis at C3-C4. Less than 2 mm of retrolisthesis at C4-C5, C5-C6, and C6-C7. Skull base and vertebrae: Visualized skullbase intact. Vertebral body heights maintained. No fracture or bone destruction. Mild scattered facet degenerative changes cervical spine. Soft tissues and spinal canal: Prevertebral soft tissues normal thickness. Atherosclerotic calcifications at the carotid bifurcations bilaterally and at the great vessels in the lower cervical region. Disc levels: Multilevel disc space narrowing and scattered endplate spurs. Scattered bony neural foraminal narrowing. Upper chest: Biapical lung scarring. Other: Degenerative changes BILATERAL temporomandibular joints. IMPRESSION: Atrophy with small-vessel chronic ischemic changes of deep cerebral white matter. No acute intracranial  abnormalities. Chronic LEFT maxillary sinus disease. Osseous demineralization with degenerative disc and facet disease changes of the cervical spine associated with 3.3 mm of anterolisthesis at C3-C4 and mild degrees of retrolisthesis at C4-C5 through C6-C7. No acute fractures identified. Electronically Signed   By: Lavonia Dana M.D.   On: 02/03/2017 20:19   Dg Shoulder Left  Result Date: 02/03/2017 CLINICAL DATA:  83 year old female with left shoulder pain. EXAM: LEFT SHOULDER - 2+ VIEW COMPARISON:  Left rib radiograph dated 02/03/2017 and the left ovary radiograph dated 02/03/2017 FINDINGS: There is no acute fracture of the left shoulder. No dislocation. There are chronic degenerative changes of the left humeral head. The bones are osteopenic. Partially visualized minimally displaced left lateral rib fracture. This is better evaluated on the rib series radiograph.  Multilevel thoracic compression deformities and vertebroplasty changes. Stable cardiac silhouette. IMPRESSION: 1. No acute fracture or dislocation of the left shoulder. 2. Minimally displaced fracture of the lateral aspect of a left rib. Electronically Signed   By: Anner Crete M.D.   On: 02/03/2017 20:58     CBC  Recent Labs Lab 02/03/17 2259 02/04/17 0509  WBC 9.5 6.7  HGB 11.3* 11.6*  HCT 33.5* 34.2*  PLT 138* 137*  MCV 96.4 96.0  MCH 32.6 32.5  MCHC 33.8 33.9  RDW 14.1 14.2  LYMPHSABS 1.2  --   MONOABS 0.6  --   EOSABS 0.0  --   BASOSABS 0.0  --     Chemistries   Recent Labs Lab 02/03/17 2259 02/04/17 0509  NA 133* 132*  K 4.1 4.2  CL 99* 98*  CO2 25 28  GLUCOSE 123* 124*  BUN 23* 19  CREATININE 0.95 0.93  CALCIUM 9.2 9.0   ------------------------------------------------------------------------------------------------------------------ estimated creatinine clearance is 34 mL/min (by C-G formula based on SCr of 0.93  mg/dL). ------------------------------------------------------------------------------------------------------------------ No results for input(s): HGBA1C in the last 72 hours. ------------------------------------------------------------------------------------------------------------------ No results for input(s): CHOL, HDL, LDLCALC, TRIG, CHOLHDL, LDLDIRECT in the last 72 hours. ------------------------------------------------------------------------------------------------------------------ No results for input(s): TSH, T4TOTAL, T3FREE, THYROIDAB in the last 72 hours.  Invalid input(s): FREET3 ------------------------------------------------------------------------------------------------------------------ No results for input(s): VITAMINB12, FOLATE, FERRITIN, TIBC, IRON, RETICCTPCT in the last 72 hours.  Coagulation profile No results for input(s): INR, PROTIME in the last 168 hours.  No results for input(s): DDIMER in the last 72 hours.  Cardiac Enzymes No results for input(s): CKMB, TROPONINI, MYOGLOBIN in the last 168 hours.  Invalid input(s): CK ------------------------------------------------------------------------------------------------------------------ Invalid input(s): [place]  Patient is a 83 year old after a mechanical fall now in severe discomfort due to rib fractures  1.  Rib fractures - after mechanical fall, significant pain. Continue current pain medications I have discussed with the patient and family regarding trial of Toradol due to some allergy concerns however there were just nausea Patient will need physical therapy she lives alone and will benefit from rehabilitation  2. Essential hypertension continue ramipril  3. Depression: Continue Zoloft  4. GERD continue omeprazole  5. dvt proph: lovenox        Code Status Orders        Start     Ordered   02/04/17 0238  Full code  Continuous     02/04/17 0237    Code Status  History    Date Active Date Inactive Code Status Order ID Comments User Context   09/06/2015 12:46 PM 09/06/2015  7:07 PM Full Code [id number]  Hessie Knows, MD Inpatient   01/24/2015  2:30 PM 01/26/2015  7:07 PM Full Code [id number]  Max Sane, MD Inpatient   01/12/2015 10:48 PM 01/15/2015  2:28 PM Full Code [id number]  Aldean Jewett, MD Inpatient    Advance Directive Documentation     Most Recent Value  Type of Advance Directive  Healthcare Power of Attorney  Pre-existing out of facility DNR order (yellow form or pink MOST form)  -  "MOST" Form in Place?  -           Consults   none   DVT Prophylaxis  Lovenox   Lab Results  Component Value Date   PLT 137 (L) 02/04/2017     Time Spent in minutes   55min  Greater than 50% of time spent in care coordination and counseling patient regarding the  condition and plan of care.   Dustin Flock M.D on 02/04/2017 at 2:10 PM  Between 7am to 6pm - Pager - [phone]  After 6pm go to [email or web address] - password [email or web address] [place] [facility]   Office  [phone]

## 2017-02-04 NOTE — Care Management Note (Signed)
Case Management Note  Patient Details  Name: Kayla Galloway MRN: [id number] Date of Birth: [birth year]  Subjective/Objective:  Met with patient at bedside to discuss discharge planning. Patient lives alone. She uses a walker. She is able to provide for her own adls. Refused a HHA. Discussed PT recommendations. Offered choice of home health agencies. [facility] to [facility]. PCP is [facility].                   Action/Plan: [facility] for HHPT.   Expected Discharge Date:  02/06/17               Expected Discharge Plan:  [place]  In-House Referral:     Discharge planning Services  CM Consult  Post Acute Care Choice:  Home Health Choice offered to:  Patient  DME Arranged:    DME Agency:     HH Arranged:  PT [place]:  [place]  Status of Service:  In process, will continue to follow  If discussed at Long Length of Stay Meetings, dates discussed:    Additional Comments:  Jolly Mango, RN 02/04/2017, 3:48 PM

## 2017-02-04 NOTE — Progress Notes (Signed)
Pt A&OX4. VSS except BP trending high throughout ED stay and admission. Pt denies chest pain/headache. Bruise to L eye. Skin tear to L knee covered with foam. Laceration above L eye covered with steri strips. Poor lower extremity vasculature and spider veins. Pedal pulses 1+ bilat. Full sensation and movement to toes bilat. No adventitious heart or lung sounds auscultated. Pt Lived at home by herself prior to the fall. Eliquis for prophylaxis. Yellow socks, fall risk signage, bed alarm on, active listening. Pt has trouble using incentive and needs more instruction and cuing. Performed weak T/C D/B.

## 2017-02-04 NOTE — Care Management Obs Status (Signed)
[place] NOTIFICATION   Patient Details  Name: Kayla Galloway MRN: [id number] Date of Birth: [birth year]   Medicare Observation Status Notification Given:  Yes    Jolly Mango, RN 02/04/2017, 11:06 AM

## 2017-02-04 NOTE — H&P (Signed)
[facility] NAME: Kayla Galloway    MR#:  [id number]  DATE OF BIRTH:  1921-02-03  DATE OF ADMISSION:  02/03/2017  PRIMARY CARE PHYSICIAN: [name], MD   REQUESTING/REFERRING PHYSICIAN: Clearnce Hasten, MD  CHIEF COMPLAINT:   Chief Complaint  Patient presents with  . Fall    HISTORY OF PRESENT ILLNESS:  Kayla Galloway  is a 83 y.o. female who presents with Fall and subsequent rib fractures. Patient had a mechanical fall she was trying to step over a garden hose. She has 6 and 8 minimally displaced rib fractures. Her pain is significantly difficult to control in the ED. Hospitalists were called for admission  PAST MEDICAL HISTORY:   Past Medical History:  Diagnosis Date  . Anemia   . Arthritis   . Cancer ([place])    skin ca lesion of scalp- removed  . Depression   . DVT (deep venous thrombosis), left    bilateral, IVC filter 2010  . GERD (gastroesophageal reflux disease)   . Gout   . Hypercholesterolemia   . Hyperkalemia   . Hypertension    Dr. [name]  . Nephrolithiasis   . Obesity   . Osteoporosis   . Peripheral vascular disease ([place])   . 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 Spider veins     PAST SURGICAL HISTORY:   Past Surgical History:  Procedure Laterality Date  . CARDIAC CATHETERIZATION     2010 Does not see a cardiac  doctor  . Colonsopy    . DENTAL SURGERY    . ECTOPIC PREGNANCY SURGERY    . EYE SURGERY Bilateral 2011,2012   cataracts  . FRACTURE SURGERY  2009   hip, rod from knee to hip  . HEMORRHOID SURGERY    . IVC filter Left    pt states it has turned side ways and could not be removed.  Marland Kitchen KYPHOPLASTY  09/03/2012   per patient, she has had kypho x 2:  Surgeon: Winfield Cunas, MD;  Location: [facility];  Service: Neurosurgery;  Laterality: N/A;  Thoracic eight Kyphoplasty   . OPEN REDUCTION INTERNAL FIXATION (ORIF) DISTAL RADIAL FRACTURE Left 09/06/2015   Procedure: OPEN REDUCTION INTERNAL FIXATION (ORIF) DISTAL RADIAL FRACTURE;  Surgeon: Hessie Knows, MD;  Location: [facility] OR[facility];  Service: Orthopedics;  Laterality: Left;  . RIGHT OOPHORECTOMY     partial-  . SKIN CANCER EXCISION     top of head  . TONSILLECTOMY     age 50  . VERTEBROPLASTY  12/31/2011   Procedure: VERTEBROPLASTY;  Surgeon: Winfield Cunas, MD;  Location: [facility];  Service: Neurosurgery;  Laterality: N/A;  Thoracic eleven vertebroplasty    SOCIAL HISTORY:   Social History  Substance Use Topics  . Smoking status: Never Smoker  . Smokeless tobacco: Never Used  . Alcohol use No    FAMILY HISTORY:   Family History  Problem Relation Age of Onset  . Heart disease Father        myocardial infarction  . Heart disease Brother        myocardial infarction  . Lymphoma Sister   . Anesthesia problems Neg Hx   . Breast cancer Neg Hx   . Colon cancer Neg Hx     DRUG ALLERGIES:   Allergies  Allergen Reactions  . Cefuroxime Axetil Rash  Pt states that she does fine with Keflex.    . Doxycycline Nausea Only and Other (See Comments)    Reaction:  Weight loss   . Advil [Ibuprofen] Swelling  . Celebrex [Celecoxib] Nausea Only  . Daypro [Oxaprozin] Other (See Comments)    Reaction:  Dizziness   . Etodolac Nausea Only  . Macrobid [employer] Macro] Other (See Comments)    Reaction:  Burning   . Penicillins Other (See Comments)    Reaction:  Burning   . Percocet [Oxycodone-Acetaminophen] Nausea Only and Swelling  . Tramadol Other (See Comments)    Reaction:  Unknown   . Valium [Diazepam] Other (See Comments)    Reaction:  Dizziness    . Neosporin [Neomycin-Bacitracin Zn-Polymyx] Rash  . Vicodin [Hydrocodone-Acetaminophen] Nausea Only    MEDICATIONS AT HOME:   Prior to Admission medications   Medication Sig Start Date End Date Taking? Authorizing Provider   acetaminophen (TYLENOL) 650 MG CR tablet Take 650 mg by mouth every 8 (eight) hours as needed for pain.    Yes [provider]  apixaban (ELIQUIS) 5 MG TABS tablet Take 1 tablet (5 mg total) by mouth 2 (two) times daily. 01/26/15  Yes Sudini, Alveta Heimlich, MD  bifidobacterium infantis (ALIGN) capsule Take 1 capsule by mouth daily. 05/22/14  Yes [name], MD  Calcium Carb-Cholecalciferol (CALCIUM 600 + D PO) Take 1 tablet by mouth 3 (three) times daily.   Yes [provider]  Glucosamine-Chondroitin 250-200 MG TABS Take by mouth.   Yes [provider]  hydrocerin (EUCERIN) CREA Apply 1 application topically 2 (two) times daily. 01/15/15  Yes Sainani, Belia Heman, MD  hydrOXYzine (ATARAX/VISTARIL) 10 MG tablet TAKE 2 & 1/2 TABLETS BY MOUTH 3 TIMES DAILY AS NEEDED ITCHING 08/29/16  Yes [name], MD  Misc Natural Products (OSTEO BI-FLEX JOINT SHIELD PO) Take 1 tablet by mouth 2 (two) times daily.   Yes [provider]  mupirocin ointment (BACTROBAN) 2 % Apply to affected area bid 06/11/16  Yes [name], MD  omeprazole (PRILOSEC) 20 MG capsule Take 1 capsule by mouth  daily 09/03/15  Yes [name], MD  ramipril (ALTACE) 5 MG capsule Take 1 capsule by mouth  daily 09/03/15  Yes [name], MD  sertraline (ZOLOFT) 50 MG tablet Take 1 tablet by mouth  daily 09/03/15  Yes [name], MD  triamcinolone cream (KENALOG) 0.5 % Apply 1 application topically 2 (two) times daily. 11/04/16  Yes [name], MD  furosemide (LASIX) 20 MG tablet TAKE 1 TABLET BY MOUTH ONCE DAILY Patient not taking: Reported on 02/04/2017 01/02/17   [name], MD    REVIEW OF SYSTEMS:  Review of Systems  Constitutional: Negative for chills, fever, malaise/fatigue and weight loss.  HENT: Negative for ear pain, hearing loss and tinnitus.   Eyes: Negative for blurred vision, double vision, pain and redness.  Respiratory: Negative for cough, hemoptysis and shortness of  breath.   Cardiovascular: Positive for chest pain. Negative for palpitations, orthopnea and leg swelling.  Gastrointestinal: Negative for abdominal pain, constipation, diarrhea, nausea and vomiting.  Genitourinary: Negative for dysuria, frequency and hematuria.  Musculoskeletal: Negative for back pain, joint pain and neck pain.  Skin:       No acne, rash, or lesions  Neurological: Negative for dizziness, tremors, focal weakness and weakness.  Endo/Heme/Allergies: Negative for polydipsia. Does not bruise/bleed easily.  Psychiatric/Behavioral: Negative for depression. The patient is not nervous/anxious and does not have insomnia.  VITAL SIGNS:   Vitals:   02/03/17 1938 02/03/17 1940 02/03/17 2130 02/03/17 2330  BP: (!) 189/83  (!) 205/98 (!) 179/72  Pulse: 86  86 (!) 48  Resp: 18   19  Temp: 97.6 F (36.4 C)     TempSrc: Oral     SpO2: 98%  95% 95%  Weight:  55.8 kg (123 lb)    Height:  5\' 5"  (1.651 m)     Wt Readings from Last 3 Encounters:  02/03/17 55.8 kg (123 lb)  12/30/16 57.6 kg (127 lb)  12/09/16 56.7 kg (125 lb)    PHYSICAL EXAMINATION:  Physical Exam  Vitals reviewed. Constitutional: She is oriented to person, place, and time. She appears well-developed and well-nourished. No distress.  HENT:  Head: Normocephalic and atraumatic.  Mouth/Throat: Oropharynx is clear and moist.  Eyes: Conjunctivae and EOM are normal. Pupils are equal, round, and reactive to light. No scleral icterus.  Neck: Normal range of motion. Neck supple. No JVD present. No thyromegaly present.  Cardiovascular: Normal rate, regular rhythm and intact distal pulses.  Exam reveals no gallop and no friction rub.   No murmur heard. Respiratory: Effort normal and breath sounds normal. No respiratory distress. She has no wheezes. She has no rales. She exhibits tenderness (Left side).  GI: Soft. Bowel sounds are normal. She exhibits no distension. There is no tenderness.  Musculoskeletal: Normal  range of motion. She exhibits no edema.  No arthritis, no gout  Lymphadenopathy:    She has no cervical adenopathy.  Neurological: She is alert and oriented to person, place, and time. No cranial nerve deficit.  No dysarthria, no aphasia  Skin: Skin is warm and dry. No rash noted. No erythema.  Psychiatric: She has a normal mood and affect. Her behavior is normal. Judgment and thought content normal.    LABORATORY PANEL:   CBC  Recent Labs Lab 02/03/17 2259  WBC 9.5  HGB 11.3*  HCT 33.5*  PLT 138*   ------------------------------------------------------------------------------------------------------------------  Chemistries   Recent Labs Lab 02/03/17 2259  NA 133*  K 4.1  CL 99*  CO2 25  GLUCOSE 123*  BUN 23*  CREATININE 0.95  CALCIUM 9.2   ------------------------------------------------------------------------------------------------------------------  Cardiac Enzymes No results for input(s): TROPONINI in the last 168 hours. ------------------------------------------------------------------------------------------------------------------  RADIOLOGY:  Dg Ribs Unilateral W/chest Left  Result Date: 02/03/2017 CLINICAL DATA:  83 y/o F; status post fall with left-sided rib pain. EXAM: LEFT RIBS AND CHEST - 3+ VIEW COMPARISON:  None. FINDINGS: Mildly displaced fractures of the left sixth and eighth ribs. No additional appreciable rib fracture identified. IMPRESSION: Mildly displaced fractures of the left sixth and eighth ribs. No additional appreciable rib fracture identified. Electronically Signed   By: Kristine Garbe M.D.   On: 02/03/2017 20:56   Dg Scapula Left  Result Date: 02/03/2017 CLINICAL DATA:  Tripped over garden hose and fell on left side. Left shoulder pain. Concern for scapular injury. Initial encounter. EXAM: LEFT SCAPULA - 2+ VIEWS COMPARISON:  Left shoulder radiographs performed earlier today at 8:16 p.m. FINDINGS: The left scapula appears  grossly intact. The left humeral head remains seated at the glenoid fossa. Mild degenerative change is noted at the left acromioclavicular joint. The previously noted mildly displaced left lateral eighth rib fracture is not imaged on this study. IMPRESSION: No evidence of fracture or dislocation. Left scapula appears intact. Electronically Signed   By: Garald Balding M.D.   On: 02/03/2017 22:13   Dg Ankle Complete Left  Result Date: 02/03/2017 CLINICAL DATA:  Tripped over garden hose and fell on left side. Left ankle pain. Initial encounter. EXAM: LEFT ANKLE COMPLETE - 3+ VIEW COMPARISON:  Left tibia/fibula radiographs performed 01/12/2015 FINDINGS: There is no evidence of fracture or dislocation. The ankle mortise is intact; the interosseous space is within normal limits. No talar tilt or subluxation is seen. The joint spaces are preserved. Mild medial soft tissue swelling is noted. Diffuse vascular calcifications are seen. IMPRESSION: 1. No evidence of fracture or dislocation. 2. Diffuse vascular calcifications seen. Electronically Signed   By: Garald Balding M.D.   On: 02/03/2017 22:18   Ct Head Wo Contrast  Result Date: 02/03/2017 CLINICAL DATA:  Tripped over a garden hose and fell onto LEFT side, complaining of LEFT-sided shoulder pain, bruising to LEFT side of face, history hypertension EXAM: CT HEAD WITHOUT CONTRAST CT CERVICAL SPINE WITHOUT CONTRAST TECHNIQUE: Multidetector CT imaging of the head and cervical spine was performed following the standard protocol without intravenous contrast. Multiplanar CT image reconstructions of the cervical spine were also generated. COMPARISON:  CT head 09/02/2015 FINDINGS: CT HEAD FINDINGS Brain: Generalized atrophy. Normal ventricular morphology. No midline shift or mass effect. Small vessel chronic ischemic changes of deep cerebral white matter. No intracranial hemorrhage, mass lesion, evidence of acute infarction, or extra-axial fluid collection. Vascular:  Atherosclerotic calcifications of the internal carotid and vertebral arteries at skullbase. Skull: Intact Sinuses/Orbits: Chronic mucosal thickening and partial opacity of the LEFT maxillary sinus with osseous wall thickening. Remaining paranasal sinuses and mastoid air cells clear Other: N/A CT CERVICAL SPINE FINDINGS Alignment: 3.3 mm of anterolisthesis at C3-C4. Less than 2 mm of retrolisthesis at C4-C5, C5-C6, and C6-C7. Skull base and vertebrae: Visualized skullbase intact. Vertebral body heights maintained. No fracture or bone destruction. Mild scattered facet degenerative changes cervical spine. Soft tissues and spinal canal: Prevertebral soft tissues normal thickness. Atherosclerotic calcifications at the carotid bifurcations bilaterally and at the great vessels in the lower cervical region. Disc levels: Multilevel disc space narrowing and scattered endplate spurs. Scattered bony neural foraminal narrowing. Upper chest: Biapical lung scarring. Other: Degenerative changes BILATERAL temporomandibular joints. IMPRESSION: Atrophy with small-vessel chronic ischemic changes of deep cerebral white matter. No acute intracranial abnormalities. Chronic LEFT maxillary sinus disease. Osseous demineralization with degenerative disc and facet disease changes of the cervical spine associated with 3.3 mm of anterolisthesis at C3-C4 and mild degrees of retrolisthesis at C4-C5 through C6-C7. No acute fractures identified. Electronically Signed   By: Lavonia Dana M.D.   On: 02/03/2017 20:19   Ct Cervical Spine Wo Contrast  Result Date: 02/03/2017 CLINICAL DATA:  Tripped over a garden hose and fell onto LEFT side, complaining of LEFT-sided shoulder pain, bruising to LEFT side of face, history hypertension EXAM: CT HEAD WITHOUT CONTRAST CT CERVICAL SPINE WITHOUT CONTRAST TECHNIQUE: Multidetector CT imaging of the head and cervical spine was performed following the standard protocol without intravenous contrast. Multiplanar CT  image reconstructions of the cervical spine were also generated. COMPARISON:  CT head 09/02/2015 FINDINGS: CT HEAD FINDINGS Brain: Generalized atrophy. Normal ventricular morphology. No midline shift or mass effect. Small vessel chronic ischemic changes of deep cerebral white matter. No intracranial hemorrhage, mass lesion, evidence of acute infarction, or extra-axial fluid collection. Vascular: Atherosclerotic calcifications of the internal carotid and vertebral arteries at skullbase. Skull: Intact Sinuses/Orbits: Chronic mucosal thickening and partial opacity of the LEFT maxillary sinus with osseous wall thickening. Remaining paranasal sinuses and mastoid air cells clear Other: N/A CT CERVICAL SPINE FINDINGS  Alignment: 3.3 mm of anterolisthesis at C3-C4. Less than 2 mm of retrolisthesis at C4-C5, C5-C6, and C6-C7. Skull base and vertebrae: Visualized skullbase intact. Vertebral body heights maintained. No fracture or bone destruction. Mild scattered facet degenerative changes cervical spine. Soft tissues and spinal canal: Prevertebral soft tissues normal thickness. Atherosclerotic calcifications at the carotid bifurcations bilaterally and at the great vessels in the lower cervical region. Disc levels: Multilevel disc space narrowing and scattered endplate spurs. Scattered bony neural foraminal narrowing. Upper chest: Biapical lung scarring. Other: Degenerative changes BILATERAL temporomandibular joints. IMPRESSION: Atrophy with small-vessel chronic ischemic changes of deep cerebral white matter. No acute intracranial abnormalities. Chronic LEFT maxillary sinus disease. Osseous demineralization with degenerative disc and facet disease changes of the cervical spine associated with 3.3 mm of anterolisthesis at C3-C4 and mild degrees of retrolisthesis at C4-C5 through C6-C7. No acute fractures identified. Electronically Signed   By: Lavonia Dana M.D.   On: 02/03/2017 20:19   Dg Shoulder Left  Result Date:  02/03/2017 CLINICAL DATA:  83 year old female with left shoulder pain. EXAM: LEFT SHOULDER - 2+ VIEW COMPARISON:  Left rib radiograph dated 02/03/2017 and the left ovary radiograph dated 02/03/2017 FINDINGS: There is no acute fracture of the left shoulder. No dislocation. There are chronic degenerative changes of the left humeral head. The bones are osteopenic. Partially visualized minimally displaced left lateral rib fracture. This is better evaluated on the rib series radiograph. Multilevel thoracic compression deformities and vertebroplasty changes. Stable cardiac silhouette. IMPRESSION: 1. No acute fracture or dislocation of the left shoulder. 2. Minimally displaced fracture of the lateral aspect of a left rib. Electronically Signed   By: Anner Crete M.D.   On: 02/03/2017 20:58    EKG:   Orders placed or performed in visit on 01/29/15  . EKG 12-Lead    IMPRESSION AND PLAN:  Principal Problem:   Rib fractures - after mechanical fall, significant pain. When necessary analgesia, incentive spirometry Active Problems:   Hypertension - continue home meds   GERD (gastroesophageal reflux disease) - home dose PPI  All the records are reviewed and case discussed with ED provider. Management plans discussed with the patient and/or family.  DVT PROPHYLAXIS: SubQ lovenox  GI PROPHYLAXIS: PPI  ADMISSION STATUS: Observation  CODE STATUS: Full Code Status History    Date Active Date Inactive Code Status Order ID Comments User Context   09/06/2015 12:46 PM 09/06/2015  7:07 PM Full Code [id number]  Hessie Knows, MD Inpatient   01/24/2015  2:30 PM 01/26/2015  7:07 PM Full Code [id number]  Max Sane, MD Inpatient   01/12/2015 10:48 PM 01/15/2015  2:28 PM Full Code [id number]  Aldean Jewett, MD Inpatient    Advance Directive Documentation     Most Recent Value  Type of Advance Directive  Healthcare Power of [place], Living will  Pre-existing out of facility DNR order (yellow form or pink MOST  form)  -  "MOST" Form in Place?  -      TOTAL TIME TAKING CARE OF THIS PATIENT: 40 minutes.   Kayla Galloway 02/04/2017, 12:45 AM  [facility]  Office  [phone]  CC: Primary care physician; [name], MD  Note:  This document was prepared using Dragon voice recognition software and may include unintentional dictation errors.

## 2017-02-05 ENCOUNTER — Telehealth: Payer: Self-pay | Admitting: *Deleted

## 2017-02-05 ENCOUNTER — Encounter: Payer: Self-pay | Admitting: General Practice

## 2017-02-05 DIAGNOSIS — S2239XA Fracture of one rib, unspecified side, initial encounter for closed fracture: Secondary | ICD-10-CM | POA: Diagnosis not present

## 2017-02-05 DIAGNOSIS — I10 Essential (primary) hypertension: Secondary | ICD-10-CM | POA: Diagnosis not present

## 2017-02-05 DIAGNOSIS — K219 Gastro-esophageal reflux disease without esophagitis: Secondary | ICD-10-CM | POA: Diagnosis not present

## 2017-02-05 MED ORDER — HYDROCODONE-ACETAMINOPHEN 5-325 MG PO TABS: 1.0000 | ORAL_TABLET | ORAL | 0 refills | Status: DC | PRN

## 2017-02-05 MED ORDER — TRAMADOL HCL 50 MG PO TABS
50.0000 mg | ORAL_TABLET | Freq: Four times a day (QID) | ORAL | Status: DC | PRN
  Administered 2017-02-05 – 2017-02-10 (×10): 50 mg via ORAL
  Filled 2017-02-05 (×10): qty 1

## 2017-02-05 MED ORDER — GUAIFENESIN-DM 100-10 MG/5ML PO SYRP
5.0000 mL | ORAL_SOLUTION | ORAL | Status: DC | PRN
  Administered 2017-02-05: 5 mL via ORAL
  Filled 2017-02-05: qty 5

## 2017-02-05 NOTE — Telephone Encounter (Signed)
Patients nephew Elta Guadeloupe requested advice on placement for a facility to help with care. Patient will not discharge today, due to oxygen levels dropping. Elta Guadeloupe stated that patient requires help with toileting and a basic daily care.  Contact Elta Guadeloupe [phone]

## 2017-02-05 NOTE — Telephone Encounter (Signed)
Patient will discharge from [facility] on 06/21 Patient will need a HFU scheduled

## 2017-02-05 NOTE — Progress Notes (Signed)
Spoke with family member concerning patient's discharge. Noticed that patient was still on 2 liters of oxygen. Asked primary nurse to check patient's oxygen level on room air.

## 2017-02-05 NOTE — Telephone Encounter (Signed)
What facilities would you recommend?

## 2017-02-05 NOTE — Progress Notes (Signed)
Patient O2 sats were 88-89% room air and 93% [place], will continue to monitor.

## 2017-02-05 NOTE — Progress Notes (Signed)
Physical Therapy Treatment Patient Details Name: Kayla Galloway MRN: [id number] DOB: March 31, 1921 Today's Date: 02/05/2017    History of Present Illness Pt is a 83 yo F admitted to acute care with L 6 and 8 rib fx after undergoing a fall. Prior to admission, pt at modI level, using RW for ambulation. PMH includes: anemia, arthritis, hx of DVT, hx of obesity, cancer, depression, GERD, spider veins, Gout, hypercholesterolemia, hyperkalemia, HTN, OP, PVD, renal vein thrombosis, and retroperitoneal bleed.     PT Comments    Pt is a pleasant 83 y.o. F, admitted to acute care with L 6 and 8th rib fx after undergoing fall. Pt performs bed mobility with minA, only secondary to pain through L ribs. Min guard with tranfers and supervision with ambulation, using RW, secondary to pain. Pt required min verbal cues for maintaining safety awareness during obstacle navigation. Total distance of amb was 350 ft, limited by pain only; pt required toileting break after 100 ft, and intermittent rest breaks in ~75 ft intervals due to SOB. Pt recovered well with each seated rest break, following amb. Able to maintain 02sats >83% upon extensive exersion; recovered to >90%immediately following sitting with cues to breath through nose; pt did not present with any signs/symptoms of distress. Able to ascend/descend 8 steps with B UE support from L handrail, requiring minA, secondary to pain. Pt presents with the following deficits: impaired strength and pain. Pt is progressing well towards functional goals as seen by improved endurance, less assistance required, and improved gait speed of 1.43 ft/sec. Overall, pt responded well to today's treatment with no adverse affects. Pt would benefit from continued skilled PT to address the previously mentioned impairments and promote return to PLOF. Currently recommends home health PT, pending d/c.   Follow Up Recommendations  Home health PT;Supervision for mobility/OOB     Equipment  Recommendations  None recommended by PT    Recommendations for Other Services       Precautions / Restrictions Precautions Precautions: None Restrictions Weight Bearing Restrictions: No    Mobility  Bed Mobility Overal bed mobility: Needs Assistance Bed Mobility: Supine to Sit     Supine to sit: Min assist     General bed mobility comments: Pt independent with transfering legs off EOB when performing supine to sit; requires minA with acquiring erect torso, only limited by pain to L ribs.   Transfers Overall transfer level: Needs assistance Equipment used: Rolling walker (2 wheeled) Transfers: Sit to/from Stand Sit to Stand: Min guard         General transfer comment: Pt performs STS with good safety awareness; requires CGA due to pain through L ribs and LE.   Ambulation/Gait Ambulation/Gait assistance: Supervision Ambulation Distance (Feet): 350 Feet Assistive device: Rolling walker (2 wheeled) Gait Pattern/deviations: Decreased step length - right;Decreased step length - left;Step-through pattern Gait velocity: 1.43 Gait velocity interpretation: <1.8 ft/sec, indicative of risk for recurrent falls General Gait Details: Able to amb with good safety awareness and step-through gait pattern. Requires minimal cues on correct body mechanics and has vastly improved gait speed since prior session. No signs of cardiopulmonary distress noted during amb.    Stairs Stairs: Yes   Stair Management: One rail Left;Step to pattern;Sideways Number of Stairs: 8 General stair comments: Pt able to amb 6 stairs with minA, secondary to pain and learning new technique. However, pt followed directions well, and demo no signs of fatigue with performing stairs after ambulation. 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   Wheelchair Mobility    Modified Rankin (Stroke Patients Only)       Balance                                             Cognition Arousal/Alertness: Awake/alert Behavior During Therapy: WFL for tasks assessed/performed Overall Cognitive Status: Within Functional Limits for tasks assessed                                        Exercises Other Exercises Other Exercises: Supine therex to B LE's with supervision x15 reps: ankle pumps, SLR, and hip abd. required no verbal cues to maintain good body mechanics and followed instructions well.   Toileting: able to transfer and manage clothing/hygeine with CGA due to increased levels of pain with functional movement.     General Comments        Pertinent Vitals/Pain Pain Assessment: Faces Faces Pain Scale: Hurts little more Pain Location: L LE and ribs Pain Descriptors / Indicators: Discomfort;Aching;Constant (Worse with activity.) Pain Intervention(s): Limited activity within patient's tolerance;Monitored during session;Patient requesting pain meds-RN notified;Repositioned    Home Living                      Prior Function            PT Goals (current goals can now be found in the care plan section) Acute Rehab PT Goals Patient Stated Goal: To walk around.  PT Goal Formulation: With patient Time For Goal Achievement: 02/18/17 Potential to Achieve Goals: Good Progress towards PT goals: Progressing toward goals    Frequency    7X/week      PT Plan Current plan remains appropriate    Co-evaluation              AM-PAC PT "6 Clicks" Daily Activity  Outcome Measure  Difficulty turning over in bed (including adjusting bedclothes, sheets and blankets)?: Total Difficulty moving from lying on back to sitting on the side of the bed? : Total Difficulty sitting down on and standing up from a chair with arms (e.g., wheelchair, bedside commode, etc,.)?: Total Help needed moving to and from a bed to chair (including a wheelchair)?: A Little Help needed walking in hospital room?: A Little Help needed climbing 3-5  steps with a railing? : A Little 6 Click Score: 12    End of Session Equipment Utilized During Treatment: Gait belt Activity Tolerance: Patient tolerated treatment well Patient left: in chair;with call bell/phone within reach;with chair alarm set;with family/visitor present Nurse Communication: Mobility status PT Visit Diagnosis: Unsteadiness on feet (R26.81);Other abnormalities of gait and mobility (R26.89);Muscle weakness (generalized) (M62.81);History of falling (Z91.81);Pain Pain - Right/Left: Left Pain - part of body: Leg (and L ribs)     Time: 0865-7846 PT Time Calculation (min) (ACUTE ONLY): 46 min  Charges:  $Gait Training: 8-22 mins $Therapeutic Exercise: 8-22 mins $Therapeutic Activity: 8-22 mins                    G Codes:       Oran Rein PT, SPT   Bevelyn Ngo 02/05/2017, 12:48 PM

## 2017-02-05 NOTE — Discharge Summary (Signed)
[name] at [facility], 83 y.o., DOB 20-Dec-1920, MRN [id number]. Admission date: 02/03/2017 Discharge Date 02/05/2017 Primary MD [name], MD Admitting Physician Lance Coon, MD  Admission Diagnosis  Facial laceration, initial encounter [S01.81XA] Fall, initial encounter [W19.XXXA] Closed fracture of multiple ribs of left side, initial encounter [S22.42XA]  Discharge Diagnosis   Principal Problem:   Rib fractures left sided 6-8 minimally displaced rib fracture   Hypertension   GERD (gastroesophageal reflux disease)   depression   gerd        Kayla Galloway  is a 83 y.o. female who presents with Fall and subsequent rib fractures. Patient had a mechanical fall she was trying to step over a garden hose. She has 6 and 8 minimally displaced rib fractures. Her pain is significantly difficult to control in the ED. Hospitalists were called for admission. Patient was admitted for pain control with supportive care. She is doing much better her pain is now improved. She was seen PT who recommended home health. Home health there's been arranged.              Consults  none  Significant Tests:  See full reports for all details     Dg Ribs Unilateral W/chest Left  Result Date: 02/03/2017 CLINICAL DATA:  83 y/o F; status post fall with left-sided rib pain. EXAM: LEFT RIBS AND CHEST - 3+ VIEW COMPARISON:  None. FINDINGS: Mildly displaced fractures of the left sixth and eighth ribs. No additional appreciable rib fracture identified. IMPRESSION: Mildly displaced fractures of the left sixth and eighth ribs. No additional appreciable rib fracture identified. Electronically Signed   By: Kristine Garbe M.D.   On: 02/03/2017 20:56   Dg Scapula Left  Result Date: 02/03/2017 CLINICAL DATA:  Tripped over garden hose and fell on left side. Left shoulder pain. Concern for scapular injury. Initial encounter. EXAM: LEFT  SCAPULA - 2+ VIEWS COMPARISON:  Left shoulder radiographs performed earlier today at 8:16 p.m. FINDINGS: The left scapula appears grossly intact. The left humeral head remains seated at the glenoid fossa. Mild degenerative change is noted at the left acromioclavicular joint. The previously noted mildly displaced left lateral eighth rib fracture is not imaged on this study. IMPRESSION: No evidence of fracture or dislocation. Left scapula appears intact. Electronically Signed   By: Garald Balding M.D.   On: 02/03/2017 22:13   Dg Ankle Complete Left  Result Date: 02/03/2017 CLINICAL DATA:  Tripped over garden hose and fell on left side. Left ankle pain. Initial encounter. EXAM: LEFT ANKLE COMPLETE - 3+ VIEW COMPARISON:  Left tibia/fibula radiographs performed 01/12/2015 FINDINGS: There is no evidence of fracture or dislocation. The ankle mortise is intact; the interosseous space is within normal limits. No talar tilt or subluxation is seen. The joint spaces are preserved. Mild medial soft tissue swelling is noted. Diffuse vascular calcifications are seen. IMPRESSION: 1. No evidence of fracture or dislocation. 2. Diffuse vascular calcifications seen. Electronically Signed   By: Garald Balding M.D.   On: 02/03/2017 22:18   Ct Head Wo Contrast  Result Date: 02/03/2017 CLINICAL DATA:  Tripped over a garden hose and fell onto LEFT side, complaining of LEFT-sided shoulder pain, bruising to LEFT side of face, history hypertension EXAM: CT HEAD WITHOUT CONTRAST CT CERVICAL SPINE WITHOUT CONTRAST TECHNIQUE: Multidetector CT imaging of the head and cervical spine was performed following the standard protocol without intravenous contrast. Multiplanar CT image reconstructions of the cervical spine were  also generated. COMPARISON:  CT head 09/02/2015 FINDINGS: CT HEAD FINDINGS Brain: Generalized atrophy. Normal ventricular morphology. No midline shift or mass effect. Small vessel chronic ischemic changes of deep cerebral  white matter. No intracranial hemorrhage, mass lesion, evidence of acute infarction, or extra-axial fluid collection. Vascular: Atherosclerotic calcifications of the internal carotid and vertebral arteries at skullbase. Skull: Intact Sinuses/Orbits: Chronic mucosal thickening and partial opacity of the LEFT maxillary sinus with osseous wall thickening. Remaining paranasal sinuses and mastoid air cells clear Other: N/A CT CERVICAL SPINE FINDINGS Alignment: 3.3 mm of anterolisthesis at C3-C4. Less than 2 mm of retrolisthesis at C4-C5, C5-C6, and C6-C7. Skull base and vertebrae: Visualized skullbase intact. Vertebral body heights maintained. No fracture or bone destruction. Mild scattered facet degenerative changes cervical spine. Soft tissues and spinal canal: Prevertebral soft tissues normal thickness. Atherosclerotic calcifications at the carotid bifurcations bilaterally and at the great vessels in the lower cervical region. Disc levels: Multilevel disc space narrowing and scattered endplate spurs. Scattered bony neural foraminal narrowing. Upper chest: Biapical lung scarring. Other: Degenerative changes BILATERAL temporomandibular joints. IMPRESSION: Atrophy with small-vessel chronic ischemic changes of deep cerebral white matter. No acute intracranial abnormalities. Chronic LEFT maxillary sinus disease. Osseous demineralization with degenerative disc and facet disease changes of the cervical spine associated with 3.3 mm of anterolisthesis at C3-C4 and mild degrees of retrolisthesis at C4-C5 through C6-C7. No acute fractures identified. Electronically Signed   By: Lavonia Dana M.D.   On: 02/03/2017 20:19   Ct Cervical Spine Wo Contrast  Result Date: 02/03/2017 CLINICAL DATA:  Tripped over a garden hose and fell onto LEFT side, complaining of LEFT-sided shoulder pain, bruising to LEFT side of face, history hypertension EXAM: CT HEAD WITHOUT CONTRAST CT CERVICAL SPINE WITHOUT CONTRAST TECHNIQUE: Multidetector CT  imaging of the head and cervical spine was performed following the standard protocol without intravenous contrast. Multiplanar CT image reconstructions of the cervical spine were also generated. COMPARISON:  CT head 09/02/2015 FINDINGS: CT HEAD FINDINGS Brain: Generalized atrophy. Normal ventricular morphology. No midline shift or mass effect. Small vessel chronic ischemic changes of deep cerebral white matter. No intracranial hemorrhage, mass lesion, evidence of acute infarction, or extra-axial fluid collection. Vascular: Atherosclerotic calcifications of the internal carotid and vertebral arteries at skullbase. Skull: Intact Sinuses/Orbits: Chronic mucosal thickening and partial opacity of the LEFT maxillary sinus with osseous wall thickening. Remaining paranasal sinuses and mastoid air cells clear Other: N/A CT CERVICAL SPINE FINDINGS Alignment: 3.3 mm of anterolisthesis at C3-C4. Less than 2 mm of retrolisthesis at C4-C5, C5-C6, and C6-C7. Skull base and vertebrae: Visualized skullbase intact. Vertebral body heights maintained. No fracture or bone destruction. Mild scattered facet degenerative changes cervical spine. Soft tissues and spinal canal: Prevertebral soft tissues normal thickness. Atherosclerotic calcifications at the carotid bifurcations bilaterally and at the great vessels in the lower cervical region. Disc levels: Multilevel disc space narrowing and scattered endplate spurs. Scattered bony neural foraminal narrowing. Upper chest: Biapical lung scarring. Other: Degenerative changes BILATERAL temporomandibular joints. IMPRESSION: Atrophy with small-vessel chronic ischemic changes of deep cerebral white matter. No acute intracranial abnormalities. Chronic LEFT maxillary sinus disease. Osseous demineralization with degenerative disc and facet disease changes of the cervical spine associated with 3.3 mm of anterolisthesis at C3-C4 and mild degrees of retrolisthesis at C4-C5 through C6-C7. No acute  fractures identified. Electronically Signed   By: Lavonia Dana M.D.   On: 02/03/2017 20:19   Dg Shoulder Left  Result Date: 02/03/2017 CLINICAL DATA:  83 year old female with left  shoulder pain. EXAM: LEFT SHOULDER - 2+ VIEW COMPARISON:  Left rib radiograph dated 02/03/2017 and the left ovary radiograph dated 02/03/2017 FINDINGS: There is no acute fracture of the left shoulder. No dislocation. There are chronic degenerative changes of the left humeral head. The bones are osteopenic. Partially visualized minimally displaced left lateral rib fracture. This is better evaluated on the rib series radiograph. Multilevel thoracic compression deformities and vertebroplasty changes. Stable cardiac silhouette. IMPRESSION: 1. No acute fracture or dislocation of the left shoulder. 2. Minimally displaced fracture of the lateral aspect of a left rib. Electronically Signed   By: Anner Crete M.D.   On: 02/03/2017 20:58       Today   Subjective:   Lynnea Maizes  feels better pain under control  Objective:   Blood pressure (!) 183/70, pulse 74, temperature 97.4 F (36.3 C), temperature source Oral, resp. rate 20, height 5\' 5"  (1.651 m), weight 126 lb 9.6 oz (57.4 kg), SpO2 97 %.  .  Intake/Output Summary (Last 24 hours) at 02/05/17 1257 Last data filed at 02/05/17 1021  Gross per 24 hour  Intake              480 ml  Output                0 ml  Net              480 ml    Exam VITAL SIGNS: Blood pressure (!) 183/70, pulse 74, temperature 97.4 F (36.3 C), temperature source Oral, resp. rate 20, height 5\' 5"  (1.651 m), weight 126 lb 9.6 oz (57.4 kg), SpO2 97 %.  GENERAL:  83 y.o.-year-old patient lying in the bed with no acute distress.  EYES: Pupils equal, round, reactive to light and accommodation. No scleral icterus. Extraocular muscles intact.  HEENT: Head atraumatic, normocephalic. Oropharynx and nasopharynx clear.  NECK:  Supple, no jugular venous distention. No thyroid enlargement, no  tenderness.  LUNGS: Normal breath sounds bilaterally, no wheezing, rales,rhonchi or crepitation. No use of accessory muscles of respiration.  CARDIOVASCULAR: S1, S2 normal. No murmurs, rubs, or gallops.  ABDOMEN: Soft, nontender, nondistended. Bowel sounds present. No organomegaly or mass.  EXTREMITIES: No pedal edema, cyanosis, or clubbing.  NEUROLOGIC: Cranial nerves II through XII are intact. Muscle strength 5/5 in all extremities. Sensation intact. Gait not checked.  PSYCHIATRIC: The patient is alert and oriented x 3.  SKIN: No obvious rash, lesion, or ulcer.   Data Review     CBC w Diff: Lab Results  Component Value Date   WBC 6.7 02/04/2017   HGB 11.6 (L) 02/04/2017   HGB 9.8 (L) 08/06/2012   HCT 34.2 (L) 02/04/2017   HCT 28.2 (L) 08/06/2012   PLT 137 (L) 02/04/2017   PLT 134 (L) 08/06/2012   LYMPHOPCT 13 02/03/2017   LYMPHOPCT 22.5 08/06/2012   MONOPCT 6 02/03/2017   MONOPCT 8.1 08/06/2012   EOSPCT 0 02/03/2017   EOSPCT 2.1 08/06/2012   BASOPCT 0 02/03/2017   BASOPCT 0.3 08/06/2012   CMP: Lab Results  Component Value Date   NA 132 (L) 02/04/2017   NA 131 (L) 08/06/2012   K 4.2 02/04/2017   K 4.3 08/04/2012   CL 98 (L) 02/04/2017   CL 96 (L) 08/04/2012   CO2 28 02/04/2017   CO2 25 08/04/2012   BUN 19 02/04/2017   BUN 16 08/04/2012   CREATININE 0.93 02/04/2017   CREATININE 1.28 08/04/2012   PROT 7.3 11/04/2016   PROT 6.8  08/04/2012   ALBUMIN 4.3 11/04/2016   ALBUMIN 3.6 08/04/2012   BILITOT 0.4 11/04/2016   BILITOT 0.5 08/04/2012   ALKPHOS 61 11/04/2016   ALKPHOS 67 08/04/2012   AST 23 11/04/2016   AST 29 08/04/2012   ALT 12 11/04/2016   ALT 22 08/04/2012  .  Micro Results No results found for this or any previous visit (from the past 240 hour(s)).      Code Status Orders        Start     Ordered   02/04/17 0238  Full code  Continuous     02/04/17 0237    Code Status History    Date Active Date Inactive Code Status Order ID Comments User  Context   09/06/2015 12:46 PM 09/06/2015  7:07 PM Full Code [id number]  Hessie Knows, MD Inpatient   01/24/2015  2:30 PM 01/26/2015  7:07 PM Full Code [id number]  Max Sane, MD Inpatient   01/12/2015 10:48 PM 01/15/2015  2:28 PM Full Code [id number]  Aldean Jewett, MD Inpatient    Advance Directive Documentation     Most Recent Value  Type of Advance Directive  Healthcare Power of Attorney  Pre-existing out of facility DNR order (yellow form or pink MOST form)  -  "MOST" Form in Place?  -          Follow-up Information    [name], MD In 1 week.   Specialty:  Internal Medicine Why:  [id number] office will call Patient with appt Contact information: [address] [place] [address] [phone]           Discharge Medications   Allergies as of 02/05/2017      Reactions   Cefuroxime Axetil Rash   Pt states that she does fine with Keflex.     Doxycycline Nausea Only, Other (See Comments)   Reaction:  Weight loss    Advil [ibuprofen] Swelling   Celebrex [celecoxib] Nausea Only   Daypro [oxaprozin] Other (See Comments)   Reaction:  Dizziness    Etodolac Nausea Only   Macrobid [nitrofurantoin Monohyd Macro] Other (See Comments)   Reaction:  Burning    Penicillins Other (See Comments)   Reaction:  Burning    Percocet [oxycodone-acetaminophen] Nausea Only, Swelling   Tramadol Other (See Comments)   Reaction:  Unknown    Valium [diazepam] Other (See Comments)   Reaction:  Dizziness    Neosporin [neomycin-bacitracin Zn-polymyx] Rash   Vicodin [hydrocodone-acetaminophen] Nausea Only      Medication List    TAKE these medications   acetaminophen 650 MG CR tablet Commonly known as:  TYLENOL Take 650 mg by mouth every 8 (eight) hours as needed for pain.   bifidobacterium infantis capsule Take 1 capsule by mouth daily.   Calcium 600-200 MG-UNIT tablet Take 1 tablet by mouth 3 (three) times daily with meals.   furosemide 20 MG  tablet Commonly known as:  LASIX Take 20 mg by mouth daily.   Glucosamine-Chondroitin 250-200 MG Tabs Take 1 tablet by mouth 2 (two) times daily.   HYDROcodone-acetaminophen 5-325 MG tablet Commonly known as:  NORCO/VICODIN Take 1-2 tablets by mouth every 4 (four) hours as needed for moderate pain.   hydrOXYzine 10 MG tablet Commonly known as:  ATARAX/VISTARIL Take 25 mg by mouth 3 (three) times daily as needed for itching.   omeprazole 20 MG capsule Commonly known as:  PRILOSEC Take 1 capsule by mouth  daily What changed:  how much to take  how to take this  when to take this  additional instructions   ramipril 5 MG capsule Commonly known as:  ALTACE Take 1 capsule by mouth  daily What changed:  how much to take  how to take this  when to take this  additional instructions   sertraline 50 MG tablet Commonly known as:  ZOLOFT Take 1 tablet by mouth  daily What changed:  how much to take  how to take this  when to take this  additional instructions          Total Time in preparing paper work, data evaluation and todays exam - 35 minutes  Dustin Flock M.D on 02/05/2017 at 12:57 PM  [facility]   Office  [phone]

## 2017-02-05 NOTE — Care Management Note (Signed)
Case Management Note  Patient Details  Name: Kayla Galloway MRN: [id number] Date of Birth: April 06, 1921  Subjective/Objective: Discharging today.                    Action/Plan: Advanced notified of discharge.   Expected Discharge Date:  02/05/17               Expected Discharge Plan:  [place]  In-House Referral:     Discharge planning Services  CM Consult  Post Acute Care Choice:  Home Health Choice offered to:  Patient  DME Arranged:    DME Agency:     HH Arranged:  PT [place]:  [place]  Status of Service:  Completed, signed off  If discussed at [place] of Stay Meetings, dates discussed:    Additional Comments:  Jolly Mango, RN 02/05/2017, 1:40 PM

## 2017-02-05 NOTE — Progress Notes (Signed)
  Speech Language Pathology Treatment: Dysphagia  Patient Details Name: Kayla Galloway MRN: [id number] DOB: 02-14-1921 Today's Date: 02/05/2017 Time: 7681-1572 SLP Time Calculation (min) (ACUTE ONLY): 45 min  Assessment / Plan / Recommendation Clinical Impression  Pt seen today for toleration of Dysphagia level 3 diet w/ Thin liquids. Pt has been tolerating diet and swallowing pills w/ applesauce per NSG report, family/pt. Pt consumed po trials during lunch meal w/ no gross oral phase deficits noted; no immediate, overt s/s of aspiration noted. However, pt did appear to have s/s of Esophageal dysmotility and exhibited signs of discomfort c/o globus feeling in chest followed by belching/burping; at that moment min wet vocal quality was noted briefly. Pt consumed a few more boluses but then c/o "full feeling" and SLP encouraged pt to stop eating to take a rest break for digestion and Esophageal clearing.  Pt and family member were thoroughly educated on general REFLUX precautions and the impact on Esophageal dysmotility even leading to regurgitation which could increase risk for aspiration of Reflux material. Discussed general aspiration precautions to include small, single sips/bites. Recommend continue w/ a dysphagia level 3 diet w/ thin liquids; Pills in puree for safer swallowing as pt discharges home. Recommend general monitoring at meals for support. ST services will be available for any further education while admitted. NSG updated.    HPI HPI: Pt  is a 83 y.o. female w/ multiple medical issues including GERD who presents with Fall and subsequent rib fractures. Patient had a mechanical fall she was trying to step over a garden hose. She has 6 and 8 minimally displaced rib fractures; also pain in leg and knee. Her pain is significantly difficult to control in the ED. Hospitalists were called for admission. Pt Lived at home by herself prior to the fall. Pt is verbally conversive and follows commands  adequately. Oriented x3; conversed w/ Sister and SLP but tended to repeat herself intermittently(Sister stated this was occurring at home as well). Pt has been tolerating her dysphagia level 3 diet per pt/family and NSG.       SLP Plan  All goals met       Recommendations  Diet recommendations: Dysphagia 3 (mechanical soft);Thin liquid Liquids provided via: Cup;Straw (monitor any straw use) Medication Administration: Whole meds with puree Supervision: Patient able to self feed;Intermittent supervision to cue for compensatory strategies (support as needed) Compensations: Minimize environmental distractions;Slow rate;Small sips/bites;Lingual sweep for clearance of pocketing;Multiple dry swallows after each bite/sip;Follow solids with liquid Postural Changes and/or Swallow Maneuvers: Seated upright 90 degrees;Upright 30-60 min after meal (REFLUX Precautions)                General recommendations:  (Dietician consult) Oral Care Recommendations: Oral care BID;Staff/trained caregiver to provide oral care;Patient independent with oral care Follow up Recommendations: None SLP Visit Diagnosis: Dysphagia, pharyngoesophageal phase (R13.14) Plan: All goals met       GO                 Kayla Kenner, MS, CCC-SLP Kayla Galloway 02/05/2017, 3:43 PM

## 2017-02-05 NOTE — Plan of Care (Signed)
Problem: Safety: Goal: Ability to remain free from injury will improve Outcome: Progressing Pt verbalized understanding to call for help when needed and demonstrated proper use of call bell.

## 2017-02-06 ENCOUNTER — Observation Stay: Payer: Medicare Other

## 2017-02-06 DIAGNOSIS — S2239XA Fracture of one rib, unspecified side, initial encounter for closed fracture: Secondary | ICD-10-CM | POA: Diagnosis not present

## 2017-02-06 DIAGNOSIS — K219 Gastro-esophageal reflux disease without esophagitis: Secondary | ICD-10-CM | POA: Diagnosis not present

## 2017-02-06 DIAGNOSIS — I10 Essential (primary) hypertension: Secondary | ICD-10-CM | POA: Diagnosis not present

## 2017-02-06 DIAGNOSIS — R0602 Shortness of breath: Secondary | ICD-10-CM | POA: Diagnosis not present

## 2017-02-06 MED ORDER — HYDROXYZINE HCL 25 MG PO TABS
25.0000 mg | ORAL_TABLET | Freq: Two times a day (BID) | ORAL | Status: DC
  Administered 2017-02-06 – 2017-02-10 (×8): 25 mg via ORAL
  Filled 2017-02-06 (×10): qty 1

## 2017-02-06 MED ORDER — POLYETHYLENE GLYCOL 3350 17 G PO PACK
17.0000 g | PACK | Freq: Every day | ORAL | Status: DC
  Administered 2017-02-06 – 2017-02-09 (×3): 17 g via ORAL
  Filled 2017-02-06 (×3): qty 1

## 2017-02-06 MED ORDER — PHENOL 1.4 % MT LIQD
1.0000 | OROMUCOSAL | Status: DC | PRN
  Filled 2017-02-06: qty 177

## 2017-02-06 MED ORDER — FUROSEMIDE 20 MG PO TABS
20.0000 mg | ORAL_TABLET | Freq: Every day | ORAL | Status: DC
  Administered 2017-02-07 – 2017-02-08 (×2): 20 mg via ORAL
  Filled 2017-02-06 (×2): qty 1

## 2017-02-06 MED ORDER — ENOXAPARIN SODIUM 40 MG/0.4ML ~~LOC~~ SOLN
40.0000 mg | SUBCUTANEOUS | Status: DC
  Administered 2017-02-06 – 2017-02-07 (×2): 40 mg via SUBCUTANEOUS
  Filled 2017-02-06 (×2): qty 0.4

## 2017-02-06 MED ORDER — DOCUSATE SODIUM 100 MG PO CAPS
200.0000 mg | ORAL_CAPSULE | Freq: Two times a day (BID) | ORAL | Status: DC
  Administered 2017-02-06 – 2017-02-10 (×9): 200 mg via ORAL
  Filled 2017-02-06 (×9): qty 2

## 2017-02-06 MED ORDER — SENNA 8.6 MG PO TABS
1.0000 | ORAL_TABLET | Freq: Every day | ORAL | Status: DC
  Administered 2017-02-06 – 2017-02-08 (×3): 8.6 mg via ORAL
  Filled 2017-02-06 (×3): qty 1

## 2017-02-06 MED ORDER — ACETAMINOPHEN 325 MG PO TABS
650.0000 mg | ORAL_TABLET | Freq: Four times a day (QID) | ORAL | Status: DC | PRN
  Administered 2017-02-06: 650 mg via ORAL
  Filled 2017-02-06: qty 2

## 2017-02-06 MED ORDER — LORATADINE 10 MG PO TABS
10.0000 mg | ORAL_TABLET | Freq: Every day | ORAL | Status: DC
  Administered 2017-02-06 – 2017-02-10 (×5): 10 mg via ORAL
  Filled 2017-02-06 (×5): qty 1

## 2017-02-06 NOTE — Care Management (Signed)
Case dicussed with attending. He states patient will not discharge today due to low sats with exertion. RNCM continuing to follow progression.

## 2017-02-06 NOTE — Progress Notes (Signed)
Physical Therapy Treatment Patient Details Name: Kayla Galloway MRN: [id number] DOB: 09/18/1920 Today's Date: 02/06/2017    History of Present Illness Pt is a 83 yo F admitted to acute care with L 6 and 8 rib fx after undergoing a fall. Prior to admission, pt at modI level, using RW for ambulation. PMH includes: anemia, arthritis, hx of DVT, hx of obesity, cancer, depression, GERD, spider veins, Gout, hypercholesterolemia, hyperkalemia, HTN, OP, PVD, renal vein thrombosis, and retroperitoneal bleed.     PT Comments    Pt is a pleasant 83 y.o. F, admitted to acute care with L 6 and 8 rib fx after undergoing fall. Pt supine in bed on bed pan upon arrival; states, "I have been on this bed pan for a while, and it is beginning to hurt." Pt performs bed mobility with minA, secondary to pain through L ribs. Pt able to transfer legs off of bed independently, though requires assist in acquiring an erect posture. Tranfers, and ambulation performed with supervision, secondary to pain and impaired strength through B LE's; pt amb total 5 ft, limited by pain. O2 sats dropped to 88% during supine to sit, likely due to pt holding breath from pain in L ribs; saturation quickly recovered to >90% upon sitting and remained >90% throughout remainder of session. Nursing notified: pt left on room air with sats >90% and pt request for pain meds. SPT educated pt on continue moving throughout the day to maintain/build strength and reduce pain, as the muscles through the torso will cont to become shortened and more difficult to mobilized with reduced mobility and continued guarding. Encouraged to actively participate in ADL tasks; recommended amb to restroom for toileting needs with supervision from staff. Pt cont to present with the following deficits: pain, LE strength, and balance. Overall, pt responded well to today's treatment with no adverse affects, and is progressing well towards therapy goals. Pt would benefit from  skilled PT to address the previously mentioned impairments and promote return to PLOF.     Follow Up Recommendations  Home health PT;Supervision for mobility/OOB     Equipment Recommendations  None recommended by PT    Recommendations for Other Services       Precautions / Restrictions Precautions Precautions: None Restrictions Weight Bearing Restrictions: No    Mobility  Bed Mobility Overal bed mobility: Needs Assistance Bed Mobility: Supine to Sit     Supine to sit: Min assist     General bed mobility comments: MinA with supine to sit due to significant pain in L ribs. Min verbal cues provided for hand placement.   Transfers Overall transfer level: Needs assistance Equipment used: Rolling walker (2 wheeled) Transfers: Sit to/from Stand Sit to Stand: Supervision         General transfer comment: Supervision with STS transfers; pt requires no cuing to perform safe mechanics. Requires increased toime to perform task, secondary to pain in L ribs.   Ambulation/Gait Ambulation/Gait assistance: Supervision Ambulation Distance (Feet): 5 Feet Assistive device: Rolling walker (2 wheeled) Gait Pattern/deviations: Decreased step length - right;Decreased step length - left;Step-through pattern     General Gait Details: Able to demonstrate reciprical gait. Maintained O2 sat at >90% with amb from bed to chair. Pt amb limited by pain to L ribs today.    Stairs            Wheelchair Mobility    Modified Rankin (Stroke Patients Only)       Balance  Cognition Arousal/Alertness: Awake/alert Behavior During Therapy: WFL for tasks assessed/performed Overall Cognitive Status: Within Functional Limits for tasks assessed                                        Exercises Other Exercises Other Exercises: Supine therex to B LE's with supervision x10 reps: ankle pumps, SLR, and quad sets.  Required no verbal cues to maintain good body mechanics and followed instructions well. Pt education re: decreased mobilty will lead to greater pain through ribs; encouraged to move throughout the day to loosen muscles and maintain strength     General Comments        Pertinent Vitals/Pain Pain Assessment: 0-10 Pain Score: 8  Pain Location: L LE and ribs Pain Descriptors / Indicators: Discomfort;Aching;Constant Pain Intervention(s): Limited activity within patient's tolerance;Monitored during session    Home Living                      Prior Function            PT Goals (current goals can now be found in the care plan section) Acute Rehab PT Goals Patient Stated Goal: To walk around.  PT Goal Formulation: With patient Time For Goal Achievement: 02/18/17 Potential to Achieve Goals: Good Progress towards PT goals: Progressing toward goals    Frequency    7X/week      PT Plan Current plan remains appropriate    Co-evaluation              AM-PAC PT "6 Clicks" Daily Activity  Outcome Measure  Difficulty turning over in bed (including adjusting bedclothes, sheets and blankets)?: Total Difficulty moving from lying on back to sitting on the side of the bed? : Total Difficulty sitting down on and standing up from a chair with arms (e.g., wheelchair, bedside commode, etc,.)?: Total Help needed moving to and from a bed to chair (including a wheelchair)?: A Little Help needed walking in hospital room?: A Little Help needed climbing 3-5 steps with a railing? : A Little 6 Click Score: 12    End of Session Equipment Utilized During Treatment: Gait belt Activity Tolerance: Patient limited by pain Patient left: in chair;with call bell/phone within reach;with chair alarm set;with family/visitor present Nurse Communication: Mobility status;Other (comment);Patient requests pain meds (Pt left on room air; sats >90%) PT Visit Diagnosis: Unsteadiness on feet  (R26.81);Other abnormalities of gait and mobility (R26.89);Muscle weakness (generalized) (M62.81);History of falling (Z91.81);Pain Pain - Right/Left: Left Pain - part of body: Leg (L ribs.)     Time: 6945-0388 PT Time Calculation (min) (ACUTE ONLY): 28 min  Charges:                       G Codes:       Kayla Galloway PT, SPT   Kayla Galloway 02/06/2017, 3:36 PM

## 2017-02-06 NOTE — Telephone Encounter (Signed)
Need more information.  I am not sure if the hospital is recommending skilled nursing.  If so, then the social worker at the hospital will arrange a skilled nursing stay (i.e., [facility], [facility], etc).  If an assisted living facility is what he desires, then he will need to go to different assisted living facilities and see which one he prefers.  We may have a list of assisted living facilities.  (some of the ones I am familiar with - [facility], [facility]).  They will then forward forms for me to complete.

## 2017-02-06 NOTE — Progress Notes (Signed)
[place] at [facility]                                                                                                                                                                                  Patient Demographics   Chasta Deshpande, is a 83 y.o. female, DOB - April 08, 1921, OVZ:[id number]  Admit date - 02/03/2017   Admitting Physician Lance Coon, MD  Outpatient Primary MD for the patient is [name], MD   LOS - 0  Subjective: Patient doing better however oxygen sats dropped with activity As per case management cannot arrange home oxygen due to non-chronic medical condition   Review of Systems:   CONSTITUTIONAL: No documented fever. No fatigue, weakness. No weight gain, no weight loss.  EYES: No blurry or double vision.  ENT: No tinnitus. No postnasal drip. No redness of the oropharynx.  RESPIRATORY: No cough, no wheeze, no hemoptysis. No dyspnea.  CARDIOVASCULAR: No chest pain. No orthopnea. No palpitations. No syncope.  GASTROINTESTINAL: No nausea, no vomiting or diarrhea. No abdominal pain. No melena or hematochezia.  GENITOURINARY: No dysuria or hematuria.  ENDOCRINE: No polyuria or nocturia. No heat or cold intolerance.  HEMATOLOGY: No anemia. No bruising. No bleeding.  INTEGUMENTARY: No rashes. No lesions.  MUSCULOSKELETAL: Positive rib pain as well as left leg and foot pain NEUROLOGIC: No numbness, tingling, or ataxia. No seizure-type activity.  PSYCHIATRIC: No anxiety. No insomnia. No ADD.    Vitals:   Vitals:   02/05/17 1617 02/06/17 0112 02/06/17 0821 02/06/17 0900  BP:  (!) 158/92 (!) 170/73 (!) 160/82  Pulse:  78 77   Resp:  19 14   Temp:  99 F (37.2 C) 97.8 F (36.6 C)   TempSrc:  Oral Oral   SpO2: 93% 95% 95%   Weight:      Height:        Wt Readings from Last 3 Encounters:  02/04/17 126 lb 9.6 oz (57.4 kg)  12/30/16 127 lb (57.6 kg)  12/09/16 125 lb (56.7 kg)     Intake/Output Summary (Last 24 hours) at  02/06/17 1452 Last data filed at 02/06/17 0944  Gross per 24 hour  Intake              840 ml  Output              500 ml  Net              340 ml    Physical Exam:   GENERAL: Pleasant-appearing in no apparent distress.  HEAD, EYES, EARS, NOSE AND THROAT: Atraumatic, normocephalic. Extraocular muscles are intact. Pupils equal and reactive to light. Sclerae anicteric.  No conjunctival injection. No oro-pharyngeal erythema.  NECK: Supple. There is no jugular venous distention. No bruits, no lymphadenopathy, no thyromegaly.  HEART: Regular rate and rhythm,. No murmurs, no rubs, no clicks.  LUNGS: Clear to auscultation bilaterally. No rales or rhonchi. No wheezes.  ABDOMEN: Soft, flat, nontender, nondistended. Has good bowel sounds. No hepatosplenomegaly appreciated.  EXTREMITIES: No evidence of any cyanosis, clubbing, or peripheral edema.  +2 pedal and radial pulses bilaterally.  NEUROLOGIC: The patient is alert, awake, and oriented x3 with no focal motor or sensory deficits appreciated bilaterally.  SKIN: Moist and warm with no rashes appreciated.  Psych: Not anxious, depressed LN: No inguinal LN enlargement    Antibiotics   Anti-infectives    None      Medications   Scheduled Meds: . docusate sodium  200 mg Oral BID  . enoxaparin (LOVENOX) injection  40 mg Subcutaneous Q24H  . [START ON 02/07/2017] furosemide  20 mg Oral Daily  . hydrOXYzine  25 mg Oral BID  . mouth rinse  15 mL Mouth Rinse BID  . pantoprazole  40 mg Oral Daily  . polyethylene glycol  17 g Oral Daily  . ramipril  5 mg Oral Daily  . senna  1 tablet Oral Daily  . sertraline  50 mg Oral Daily   Continuous Infusions: PRN Meds:.acetaminophen, guaiFENesin-dextromethorphan, ketorolac, ondansetron **OR** ondansetron (ZOFRAN) IV, phenol, traMADol   Data Review:   Micro Results No results found for this or any previous visit (from the past 240 hour(s)).  Radiology Reports Dg Ribs Unilateral W/chest  Left  Result Date: 02/03/2017 CLINICAL DATA:  83 y/o F; status post fall with left-sided rib pain. EXAM: LEFT RIBS AND CHEST - 3+ VIEW COMPARISON:  None. FINDINGS: Mildly displaced fractures of the left sixth and eighth ribs. No additional appreciable rib fracture identified. IMPRESSION: Mildly displaced fractures of the left sixth and eighth ribs. No additional appreciable rib fracture identified. Electronically Signed   By: Kristine Garbe M.D.   On: 02/03/2017 20:56   Dg Scapula Left  Result Date: 02/03/2017 CLINICAL DATA:  Tripped over garden hose and fell on left side. Left shoulder pain. Concern for scapular injury. Initial encounter. EXAM: LEFT SCAPULA - 2+ VIEWS COMPARISON:  Left shoulder radiographs performed earlier today at 8:16 p.m. FINDINGS: The left scapula appears grossly intact. The left humeral head remains seated at the glenoid fossa. Mild degenerative change is noted at the left acromioclavicular joint. The previously noted mildly displaced left lateral eighth rib fracture is not imaged on this study. IMPRESSION: No evidence of fracture or dislocation. Left scapula appears intact. Electronically Signed   By: Garald Balding M.D.   On: 02/03/2017 22:13   Dg Ankle Complete Left  Result Date: 02/03/2017 CLINICAL DATA:  Tripped over garden hose and fell on left side. Left ankle pain. Initial encounter. EXAM: LEFT ANKLE COMPLETE - 3+ VIEW COMPARISON:  Left tibia/fibula radiographs performed 01/12/2015 FINDINGS: There is no evidence of fracture or dislocation. The ankle mortise is intact; the interosseous space is within normal limits. No talar tilt or subluxation is seen. The joint spaces are preserved. Mild medial soft tissue swelling is noted. Diffuse vascular calcifications are seen. IMPRESSION: 1. No evidence of fracture or dislocation. 2. Diffuse vascular calcifications seen. Electronically Signed   By: Garald Balding M.D.   On: 02/03/2017 22:18   Ct Head Wo Contrast  Result  Date: 02/03/2017 CLINICAL DATA:  Tripped over a garden hose and fell onto LEFT side, complaining of LEFT-sided  shoulder pain, bruising to LEFT side of face, history hypertension EXAM: CT HEAD WITHOUT CONTRAST CT CERVICAL SPINE WITHOUT CONTRAST TECHNIQUE: Multidetector CT imaging of the head and cervical spine was performed following the standard protocol without intravenous contrast. Multiplanar CT image reconstructions of the cervical spine were also generated. COMPARISON:  CT head 09/02/2015 FINDINGS: CT HEAD FINDINGS Brain: Generalized atrophy. Normal ventricular morphology. No midline shift or mass effect. Small vessel chronic ischemic changes of deep cerebral white matter. No intracranial hemorrhage, mass lesion, evidence of acute infarction, or extra-axial fluid collection. Vascular: Atherosclerotic calcifications of the internal carotid and vertebral arteries at skullbase. Skull: Intact Sinuses/Orbits: Chronic mucosal thickening and partial opacity of the LEFT maxillary sinus with osseous wall thickening. Remaining paranasal sinuses and mastoid air cells clear Other: N/A CT CERVICAL SPINE FINDINGS Alignment: 3.3 mm of anterolisthesis at C3-C4. Less than 2 mm of retrolisthesis at C4-C5, C5-C6, and C6-C7. Skull base and vertebrae: Visualized skullbase intact. Vertebral body heights maintained. No fracture or bone destruction. Mild scattered facet degenerative changes cervical spine. Soft tissues and spinal canal: Prevertebral soft tissues normal thickness. Atherosclerotic calcifications at the carotid bifurcations bilaterally and at the great vessels in the lower cervical region. Disc levels: Multilevel disc space narrowing and scattered endplate spurs. Scattered bony neural foraminal narrowing. Upper chest: Biapical lung scarring. Other: Degenerative changes BILATERAL temporomandibular joints. IMPRESSION: Atrophy with small-vessel chronic ischemic changes of deep cerebral white matter. No acute intracranial  abnormalities. Chronic LEFT maxillary sinus disease. Osseous demineralization with degenerative disc and facet disease changes of the cervical spine associated with 3.3 mm of anterolisthesis at C3-C4 and mild degrees of retrolisthesis at C4-C5 through C6-C7. No acute fractures identified. Electronically Signed   By: Lavonia Dana M.D.   On: 02/03/2017 20:19   Ct Cervical Spine Wo Contrast  Result Date: 02/03/2017 CLINICAL DATA:  Tripped over a garden hose and fell onto LEFT side, complaining of LEFT-sided shoulder pain, bruising to LEFT side of face, history hypertension EXAM: CT HEAD WITHOUT CONTRAST CT CERVICAL SPINE WITHOUT CONTRAST TECHNIQUE: Multidetector CT imaging of the head and cervical spine was performed following the standard protocol without intravenous contrast. Multiplanar CT image reconstructions of the cervical spine were also generated. COMPARISON:  CT head 09/02/2015 FINDINGS: CT HEAD FINDINGS Brain: Generalized atrophy. Normal ventricular morphology. No midline shift or mass effect. Small vessel chronic ischemic changes of deep cerebral white matter. No intracranial hemorrhage, mass lesion, evidence of acute infarction, or extra-axial fluid collection. Vascular: Atherosclerotic calcifications of the internal carotid and vertebral arteries at skullbase. Skull: Intact Sinuses/Orbits: Chronic mucosal thickening and partial opacity of the LEFT maxillary sinus with osseous wall thickening. Remaining paranasal sinuses and mastoid air cells clear Other: N/A CT CERVICAL SPINE FINDINGS Alignment: 3.3 mm of anterolisthesis at C3-C4. Less than 2 mm of retrolisthesis at C4-C5, C5-C6, and C6-C7. Skull base and vertebrae: Visualized skullbase intact. Vertebral body heights maintained. No fracture or bone destruction. Mild scattered facet degenerative changes cervical spine. Soft tissues and spinal canal: Prevertebral soft tissues normal thickness. Atherosclerotic calcifications at the carotid bifurcations  bilaterally and at the great vessels in the lower cervical region. Disc levels: Multilevel disc space narrowing and scattered endplate spurs. Scattered bony neural foraminal narrowing. Upper chest: Biapical lung scarring. Other: Degenerative changes BILATERAL temporomandibular joints. IMPRESSION: Atrophy with small-vessel chronic ischemic changes of deep cerebral white matter. No acute intracranial abnormalities. Chronic LEFT maxillary sinus disease. Osseous demineralization with degenerative disc and facet disease changes of the cervical spine associated with 3.3 mm  of anterolisthesis at C3-C4 and mild degrees of retrolisthesis at C4-C5 through C6-C7. No acute fractures identified. Electronically Signed   By: Lavonia Dana M.D.   On: 02/03/2017 20:19   Dg Chest Port 1 View  Result Date: 02/06/2017 CLINICAL DATA:  Shortness of breath.  DVT.  Fall.  Rib fractures. EXAM: PORTABLE CHEST 1 VIEW COMPARISON:  Rib series 619 2018.  Chest x-ray 01/29/2015. FINDINGS: Mediastinum hilar structures normal. Heart size normal. No pulmonary venous congestion. Left lower lobe atelectasis and consolidation with left-sided pleural effusion. No pneumothorax . Biapical pleural thickening consistent scarring. Prior lower thoracic vertebroplasty. Left lower rib fractures best identified by prior rib series. IMPRESSION: 1. Left lower lobe left lower lobe atelectasis and consolidation and associated small left pleural effusion. 2. Left lower rib fractures best identified by prior rib series. No pneumothorax . Electronically Signed   By: Marcello Moores  Register   On: 02/06/2017 07:35   Dg Shoulder Left  Result Date: 02/03/2017 CLINICAL DATA:  83 year old female with left shoulder pain. EXAM: LEFT SHOULDER - 2+ VIEW COMPARISON:  Left rib radiograph dated 02/03/2017 and the left ovary radiograph dated 02/03/2017 FINDINGS: There is no acute fracture of the left shoulder. No dislocation. There are chronic degenerative changes of the left humeral  head. The bones are osteopenic. Partially visualized minimally displaced left lateral rib fracture. This is better evaluated on the rib series radiograph. Multilevel thoracic compression deformities and vertebroplasty changes. Stable cardiac silhouette. IMPRESSION: 1. No acute fracture or dislocation of the left shoulder. 2. Minimally displaced fracture of the lateral aspect of a left rib. Electronically Signed   By: Anner Crete M.D.   On: 02/03/2017 20:58     CBC  Recent Labs Lab 02/03/17 2259 02/04/17 0509  WBC 9.5 6.7  HGB 11.3* 11.6*  HCT 33.5* 34.2*  PLT 138* 137*  MCV 96.4 96.0  MCH 32.6 32.5  MCHC 33.8 33.9  RDW 14.1 14.2  LYMPHSABS 1.2  --   MONOABS 0.6  --   EOSABS 0.0  --   BASOSABS 0.0  --     Chemistries   Recent Labs Lab 02/03/17 2259 02/04/17 0509  NA 133* 132*  K 4.1 4.2  CL 99* 98*  CO2 25 28  GLUCOSE 123* 124*  BUN 23* 19  CREATININE 0.95 0.93  CALCIUM 9.2 9.0   ------------------------------------------------------------------------------------------------------------------ estimated creatinine clearance is 34 mL/min (by C-G formula based on SCr of 0.93 mg/dL). ------------------------------------------------------------------------------------------------------------------ No results for input(s): HGBA1C in the last 72 hours. ------------------------------------------------------------------------------------------------------------------ No results for input(s): CHOL, HDL, LDLCALC, TRIG, CHOLHDL, LDLDIRECT in the last 72 hours. ------------------------------------------------------------------------------------------------------------------ No results for input(s): TSH, T4TOTAL, T3FREE, THYROIDAB in the last 72 hours.  Invalid input(s): FREET3 ------------------------------------------------------------------------------------------------------------------ No results for input(s): VITAMINB12, FOLATE, FERRITIN, TIBC, IRON, RETICCTPCT in the  last 72 hours.  Coagulation profile No results for input(s): INR, PROTIME in the last 168 hours.  No results for input(s): DDIMER in the last 72 hours.  Cardiac Enzymes No results for input(s): CKMB, TROPONINI, MYOGLOBIN in the last 168 hours.  Invalid input(s): CK ------------------------------------------------------------------------------------------------------------------ Invalid input(s): [place]  Patient is a 83 year old after a mechanical fall now in severe discomfort due to rib fractures  1.  Rib fractures - after mechanical fall,  Pain controled  2. Hypoxia suspect due to patient not able to take deep breaths. I will repeat a chest x-ray in the morning. Try to wean patient's oxygen  3. Essential hypertension continue ramipril  4 Depression:  Continue Zoloft  5. GERD continue omeprazole  6. dvt proph: lovenox        Code Status Orders        Start     Ordered   02/04/17 0238  Full code  Continuous     02/04/17 0237    Code Status History    Date Active Date Inactive Code Status Order ID Comments User Context   09/06/2015 12:46 PM 09/06/2015  7:07 PM Full Code [id number]  Hessie Knows, MD Inpatient   01/24/2015  2:30 PM 01/26/2015  7:07 PM Full Code [id number]  Max Sane, MD Inpatient   01/12/2015 10:48 PM 01/15/2015  2:28 PM Full Code [id number]  Aldean Jewett, MD Inpatient    Advance Directive Documentation     Most Recent Value  Type of Advance Directive  Healthcare Power of Attorney  Pre-existing out of facility DNR order (yellow form or pink MOST form)  -  "MOST" Form in Place?  -           Consults   none   DVT Prophylaxis  Lovenox   Lab Results  Component Value Date   PLT 137 (L) 02/04/2017     Time Spent in minutes   20min  Greater than 50% of time spent in care coordination and counseling patient regarding the condition and plan of care.   Dustin Flock M.D on 02/06/2017 at 2:52 PM  Between 7am to 6pm -  Pager - [phone]  After 6pm go to [email or web address] - password [email or web address] [facility]   Office  [phone]

## 2017-02-06 NOTE — Care Management (Signed)
Discharge cancelled yesterday due to O2 sats dropping. Improved today. It is anticipated patient will discharge today home with [facility] for home PT.

## 2017-02-06 NOTE — Plan of Care (Signed)
Problem: Ineffective Breathing Pattern Goal: Ability to achieve and maintain adequate cardiopulmonary perfusion will improve Outcome: Progressing Patient continues to use the Incentive spirometer every hour. Patient now able to maintain SO2 above 93 on room air.

## 2017-02-06 NOTE — Progress Notes (Signed)
[place] at [facility]                                                                                                                                                                                  Patient Demographics   Kristianna Saperstein, is a 83 y.o. female, DOB - 09-12-1920, EVO:[id number]  Admit date - 02/03/2017   Admitting Physician Lance Coon, MD  Outpatient Primary MD for the patient is [name], MD   LOS - 0  Subjective: Pt doing better but still requiring oxygne  Review of Systems:   CONSTITUTIONAL: No documented fever. No fatigue, weakness. No weight gain, no weight loss.  EYES: No blurry or double vision.  ENT: No tinnitus. No postnasal drip. No redness of the oropharynx.  RESPIRATORY: No cough, no wheeze, no hemoptysis. No dyspnea.  CARDIOVASCULAR: No chest pain. No orthopnea. No palpitations. No syncope.  GASTROINTESTINAL: No nausea, no vomiting or diarrhea. No abdominal pain. No melena or hematochezia.  GENITOURINARY: No dysuria or hematuria.  ENDOCRINE: No polyuria or nocturia. No heat or cold intolerance.  HEMATOLOGY: No anemia. No bruising. No bleeding.  INTEGUMENTARY: No rashes. No lesions.  MUSCULOSKELETAL: Positive rib pain as well as left leg and foot pain NEUROLOGIC: No numbness, tingling, or ataxia. No seizure-type activity.  PSYCHIATRIC: No anxiety. No insomnia. No ADD.    Vitals:   Vitals:   02/05/17 1617 02/06/17 0112 02/06/17 0821 02/06/17 0900  BP:  (!) 158/92 (!) 170/73 (!) 160/82  Pulse:  78 77   Resp:  19 14   Temp:  99 F (37.2 C) 97.8 F (36.6 C)   TempSrc:  Oral Oral   SpO2: 93% 95% 95%   Weight:      Height:        Wt Readings from Last 3 Encounters:  02/04/17 126 lb 9.6 oz (57.4 kg)  12/30/16 127 lb (57.6 kg)  12/09/16 125 lb (56.7 kg)     Intake/Output Summary (Last 24 hours) at 02/06/17 1458 Last data filed at 02/06/17 0944  Gross per 24 hour  Intake              840 ml  Output               500 ml  Net              340 ml    Physical Exam:   GENERAL: Pleasant-appearing in no apparent distress.  HEAD, EYES, EARS, NOSE AND THROAT: Atraumatic, normocephalic. Extraocular muscles are intact. Pupils equal and reactive to light. Sclerae anicteric. No conjunctival injection. No oro-pharyngeal erythema.  NECK: Supple. There is no jugular venous distention. No  bruits, no lymphadenopathy, no thyromegaly.  HEART: Regular rate and rhythm,. No murmurs, no rubs, no clicks.  LUNGS: Clear to auscultation bilaterally. No rales or rhonchi. No wheezes.  ABDOMEN: Soft, flat, nontender, nondistended. Has good bowel sounds. No hepatosplenomegaly appreciated.  EXTREMITIES: No evidence of any cyanosis, clubbing, or peripheral edema.  +2 pedal and radial pulses bilaterally.  NEUROLOGIC: The patient is alert, awake, and oriented x3 with no focal motor or sensory deficits appreciated bilaterally.  SKIN: Moist and warm with no rashes appreciated.  Psych: Not anxious, depressed LN: No inguinal LN enlargement    Antibiotics   Anti-infectives    None      Medications   Scheduled Meds: . docusate sodium  200 mg Oral BID  . enoxaparin (LOVENOX) injection  40 mg Subcutaneous Q24H  . [START ON 02/07/2017] furosemide  20 mg Oral Daily  . hydrOXYzine  25 mg Oral BID  . loratadine  10 mg Oral Daily  . mouth rinse  15 mL Mouth Rinse BID  . pantoprazole  40 mg Oral Daily  . polyethylene glycol  17 g Oral Daily  . ramipril  5 mg Oral Daily  . senna  1 tablet Oral Daily  . sertraline  50 mg Oral Daily   Continuous Infusions: PRN Meds:.acetaminophen, guaiFENesin-dextromethorphan, ketorolac, ondansetron **OR** ondansetron (ZOFRAN) IV, phenol, traMADol   Data Review:   Micro Results No results found for this or any previous visit (from the past 240 hour(s)).  Radiology Reports Dg Ribs Unilateral W/chest Left  Result Date: 02/03/2017 CLINICAL DATA:  83 y/o F; status post fall with left-sided  rib pain. EXAM: LEFT RIBS AND CHEST - 3+ VIEW COMPARISON:  None. FINDINGS: Mildly displaced fractures of the left sixth and eighth ribs. No additional appreciable rib fracture identified. IMPRESSION: Mildly displaced fractures of the left sixth and eighth ribs. No additional appreciable rib fracture identified. Electronically Signed   By: Kristine Garbe M.D.   On: 02/03/2017 20:56   Dg Scapula Left  Result Date: 02/03/2017 CLINICAL DATA:  Tripped over garden hose and fell on left side. Left shoulder pain. Concern for scapular injury. Initial encounter. EXAM: LEFT SCAPULA - 2+ VIEWS COMPARISON:  Left shoulder radiographs performed earlier today at 8:16 p.m. FINDINGS: The left scapula appears grossly intact. The left humeral head remains seated at the glenoid fossa. Mild degenerative change is noted at the left acromioclavicular joint. The previously noted mildly displaced left lateral eighth rib fracture is not imaged on this study. IMPRESSION: No evidence of fracture or dislocation. Left scapula appears intact. Electronically Signed   By: Garald Balding M.D.   On: 02/03/2017 22:13   Dg Ankle Complete Left  Result Date: 02/03/2017 CLINICAL DATA:  Tripped over garden hose and fell on left side. Left ankle pain. Initial encounter. EXAM: LEFT ANKLE COMPLETE - 3+ VIEW COMPARISON:  Left tibia/fibula radiographs performed 01/12/2015 FINDINGS: There is no evidence of fracture or dislocation. The ankle mortise is intact; the interosseous space is within normal limits. No talar tilt or subluxation is seen. The joint spaces are preserved. Mild medial soft tissue swelling is noted. Diffuse vascular calcifications are seen. IMPRESSION: 1. No evidence of fracture or dislocation. 2. Diffuse vascular calcifications seen. Electronically Signed   By: Garald Balding M.D.   On: 02/03/2017 22:18   Ct Head Wo Contrast  Result Date: 02/03/2017 CLINICAL DATA:  Tripped over a garden hose and fell onto LEFT side,  complaining of LEFT-sided shoulder pain, bruising to LEFT side of face,  history hypertension EXAM: CT HEAD WITHOUT CONTRAST CT CERVICAL SPINE WITHOUT CONTRAST TECHNIQUE: Multidetector CT imaging of the head and cervical spine was performed following the standard protocol without intravenous contrast. Multiplanar CT image reconstructions of the cervical spine were also generated. COMPARISON:  CT head 09/02/2015 FINDINGS: CT HEAD FINDINGS Brain: Generalized atrophy. Normal ventricular morphology. No midline shift or mass effect. Small vessel chronic ischemic changes of deep cerebral white matter. No intracranial hemorrhage, mass lesion, evidence of acute infarction, or extra-axial fluid collection. Vascular: Atherosclerotic calcifications of the internal carotid and vertebral arteries at skullbase. Skull: Intact Sinuses/Orbits: Chronic mucosal thickening and partial opacity of the LEFT maxillary sinus with osseous wall thickening. Remaining paranasal sinuses and mastoid air cells clear Other: N/A CT CERVICAL SPINE FINDINGS Alignment: 3.3 mm of anterolisthesis at C3-C4. Less than 2 mm of retrolisthesis at C4-C5, C5-C6, and C6-C7. Skull base and vertebrae: Visualized skullbase intact. Vertebral body heights maintained. No fracture or bone destruction. Mild scattered facet degenerative changes cervical spine. Soft tissues and spinal canal: Prevertebral soft tissues normal thickness. Atherosclerotic calcifications at the carotid bifurcations bilaterally and at the great vessels in the lower cervical region. Disc levels: Multilevel disc space narrowing and scattered endplate spurs. Scattered bony neural foraminal narrowing. Upper chest: Biapical lung scarring. Other: Degenerative changes BILATERAL temporomandibular joints. IMPRESSION: Atrophy with small-vessel chronic ischemic changes of deep cerebral white matter. No acute intracranial abnormalities. Chronic LEFT maxillary sinus disease. Osseous demineralization with  degenerative disc and facet disease changes of the cervical spine associated with 3.3 mm of anterolisthesis at C3-C4 and mild degrees of retrolisthesis at C4-C5 through C6-C7. No acute fractures identified. Electronically Signed   By: Lavonia Dana M.D.   On: 02/03/2017 20:19   Ct Cervical Spine Wo Contrast  Result Date: 02/03/2017 CLINICAL DATA:  Tripped over a garden hose and fell onto LEFT side, complaining of LEFT-sided shoulder pain, bruising to LEFT side of face, history hypertension EXAM: CT HEAD WITHOUT CONTRAST CT CERVICAL SPINE WITHOUT CONTRAST TECHNIQUE: Multidetector CT imaging of the head and cervical spine was performed following the standard protocol without intravenous contrast. Multiplanar CT image reconstructions of the cervical spine were also generated. COMPARISON:  CT head 09/02/2015 FINDINGS: CT HEAD FINDINGS Brain: Generalized atrophy. Normal ventricular morphology. No midline shift or mass effect. Small vessel chronic ischemic changes of deep cerebral white matter. No intracranial hemorrhage, mass lesion, evidence of acute infarction, or extra-axial fluid collection. Vascular: Atherosclerotic calcifications of the internal carotid and vertebral arteries at skullbase. Skull: Intact Sinuses/Orbits: Chronic mucosal thickening and partial opacity of the LEFT maxillary sinus with osseous wall thickening. Remaining paranasal sinuses and mastoid air cells clear Other: N/A CT CERVICAL SPINE FINDINGS Alignment: 3.3 mm of anterolisthesis at C3-C4. Less than 2 mm of retrolisthesis at C4-C5, C5-C6, and C6-C7. Skull base and vertebrae: Visualized skullbase intact. Vertebral body heights maintained. No fracture or bone destruction. Mild scattered facet degenerative changes cervical spine. Soft tissues and spinal canal: Prevertebral soft tissues normal thickness. Atherosclerotic calcifications at the carotid bifurcations bilaterally and at the great vessels in the lower cervical region. Disc levels:  Multilevel disc space narrowing and scattered endplate spurs. Scattered bony neural foraminal narrowing. Upper chest: Biapical lung scarring. Other: Degenerative changes BILATERAL temporomandibular joints. IMPRESSION: Atrophy with small-vessel chronic ischemic changes of deep cerebral white matter. No acute intracranial abnormalities. Chronic LEFT maxillary sinus disease. Osseous demineralization with degenerative disc and facet disease changes of the cervical spine associated with 3.3 mm of anterolisthesis at C3-C4 and mild degrees of  retrolisthesis at C4-C5 through C6-C7. No acute fractures identified. Electronically Signed   By: Lavonia Dana M.D.   On: 02/03/2017 20:19   Dg Chest Port 1 View  Result Date: 02/06/2017 CLINICAL DATA:  Shortness of breath.  DVT.  Fall.  Rib fractures. EXAM: PORTABLE CHEST 1 VIEW COMPARISON:  Rib series 619 2018.  Chest x-ray 01/29/2015. FINDINGS: Mediastinum hilar structures normal. Heart size normal. No pulmonary venous congestion. Left lower lobe atelectasis and consolidation with left-sided pleural effusion. No pneumothorax . Biapical pleural thickening consistent scarring. Prior lower thoracic vertebroplasty. Left lower rib fractures best identified by prior rib series. IMPRESSION: 1. Left lower lobe left lower lobe atelectasis and consolidation and associated small left pleural effusion. 2. Left lower rib fractures best identified by prior rib series. No pneumothorax . Electronically Signed   By: Marcello Moores  Register   On: 02/06/2017 07:35   Dg Shoulder Left  Result Date: 02/03/2017 CLINICAL DATA:  83 year old female with left shoulder pain. EXAM: LEFT SHOULDER - 2+ VIEW COMPARISON:  Left rib radiograph dated 02/03/2017 and the left ovary radiograph dated 02/03/2017 FINDINGS: There is no acute fracture of the left shoulder. No dislocation. There are chronic degenerative changes of the left humeral head. The bones are osteopenic. Partially visualized minimally displaced left  lateral rib fracture. This is better evaluated on the rib series radiograph. Multilevel thoracic compression deformities and vertebroplasty changes. Stable cardiac silhouette. IMPRESSION: 1. No acute fracture or dislocation of the left shoulder. 2. Minimally displaced fracture of the lateral aspect of a left rib. Electronically Signed   By: Anner Crete M.D.   On: 02/03/2017 20:58     CBC  Recent Labs Lab 02/03/17 2259 02/04/17 0509  WBC 9.5 6.7  HGB 11.3* 11.6*  HCT 33.5* 34.2*  PLT 138* 137*  MCV 96.4 96.0  MCH 32.6 32.5  MCHC 33.8 33.9  RDW 14.1 14.2  LYMPHSABS 1.2  --   MONOABS 0.6  --   EOSABS 0.0  --   BASOSABS 0.0  --     Chemistries   Recent Labs Lab 02/03/17 2259 02/04/17 0509  NA 133* 132*  K 4.1 4.2  CL 99* 98*  CO2 25 28  GLUCOSE 123* 124*  BUN 23* 19  CREATININE 0.95 0.93  CALCIUM 9.2 9.0   ------------------------------------------------------------------------------------------------------------------ estimated creatinine clearance is 34 mL/min (by C-G formula based on SCr of 0.93 mg/dL). ------------------------------------------------------------------------------------------------------------------ No results for input(s): HGBA1C in the last 72 hours. ------------------------------------------------------------------------------------------------------------------ No results for input(s): CHOL, HDL, LDLCALC, TRIG, CHOLHDL, LDLDIRECT in the last 72 hours. ------------------------------------------------------------------------------------------------------------------ No results for input(s): TSH, T4TOTAL, T3FREE, THYROIDAB in the last 72 hours.  Invalid input(s): FREET3 ------------------------------------------------------------------------------------------------------------------ No results for input(s): VITAMINB12, FOLATE, FERRITIN, TIBC, IRON, RETICCTPCT in the last 72 hours.  Coagulation profile No results for input(s): INR, PROTIME in  the last 168 hours.  No results for input(s): DDIMER in the last 72 hours.  Cardiac Enzymes No results for input(s): CKMB, TROPONINI, MYOGLOBIN in the last 168 hours.  Invalid input(s): CK ------------------------------------------------------------------------------------------------------------------ Invalid input(s): [place]  Patient is a 83 year old after a mechanical fall now in severe discomfort due to rib fractures  1.  Rib fractures - after mechanical fall,  Pain controledTo new current therapy  2. Hypoxia chest x-ray shows effusion as well as atelectasis continue current therapy I will add chest physical therapy continue incentive spirometry  3. Essential hypertension continue ramipril  4 Depression: Continue Zoloft  5. GERD continue omeprazole  6.  dvt proph: lovenox       Suspect discharge tomorrow    Code Status Orders        Start     Ordered   02/04/17 0238  Full code  Continuous     02/04/17 0237    Code Status History    Date Active Date Inactive Code Status Order ID Comments User Context   09/06/2015 12:46 PM 09/06/2015  7:07 PM Full Code [id number]  Hessie Knows, MD Inpatient   01/24/2015  2:30 PM 01/26/2015  7:07 PM Full Code [id number]  Max Sane, MD Inpatient   01/12/2015 10:48 PM 01/15/2015  2:28 PM Full Code [id number]  Aldean Jewett, MD Inpatient    Advance Directive Documentation     Most Recent Value  Type of Advance Directive  Healthcare Power of Attorney  Pre-existing out of facility DNR order (yellow form or pink MOST form)  -  "MOST" Form in Place?  -           Consults   none   DVT Prophylaxis  Lovenox   Lab Results  Component Value Date   PLT 137 (L) 02/04/2017     Time Spent in minutes   72min  Greater than 50% of time spent in care coordination and counseling patient regarding the condition and plan of care.   Dustin Flock M.D on 02/06/2017 at 2:58 PM  Between 7am to 6pm - Pager -  [phone]  After 6pm go to [email or web address] - password [email or web address] [place] [facility]   Office  [phone]

## 2017-02-06 NOTE — Telephone Encounter (Signed)
Were would you like to work her in?

## 2017-02-06 NOTE — Telephone Encounter (Signed)
Kayla Galloway from [facility] called to schedule pt for HFU. Pt needs a 1 week follow up with Dr. Nicki Reaper. Pt is being discharged today and will be going home.

## 2017-02-06 NOTE — Telephone Encounter (Signed)
Spoke with Kayla Galloway at [facility] he is very concerned. Patient fell on Tuesday outside, she was asked to not go outside and fell from the heat and broke two ribs which with a normal person wouldn't be bad.  Yesterday they did a PT evaluation and she passed with flying colors according to the PT and team, but he said they didn't see how her oxygen levels went from 87% to 50% and that her pain control/ Management was not good.  He stated they believe she is fine to go home with Home health only.  He is working on possibly having a home care 24 hour group come into the home if she does go to the house.  I gave the suggestions of the assisted living facilities and he will look into them also.  I explained that when she is discharged I will be calling for a transitional care call and that he can let us know details of what they are going to do with her care needs. He appreciated the follow up call.

## 2017-02-07 DIAGNOSIS — I10 Essential (primary) hypertension: Secondary | ICD-10-CM | POA: Diagnosis not present

## 2017-02-07 DIAGNOSIS — S2239XA Fracture of one rib, unspecified side, initial encounter for closed fracture: Secondary | ICD-10-CM | POA: Diagnosis not present

## 2017-02-07 DIAGNOSIS — K219 Gastro-esophageal reflux disease without esophagitis: Secondary | ICD-10-CM | POA: Diagnosis not present

## 2017-02-07 MED ORDER — OXYCODONE HCL ER 10 MG PO T12A
10.0000 mg | EXTENDED_RELEASE_TABLET | Freq: Two times a day (BID) | ORAL | Status: DC
  Administered 2017-02-07 – 2017-02-08 (×3): 10 mg via ORAL
  Filled 2017-02-07 (×2): qty 1

## 2017-02-07 MED ORDER — ACETAMINOPHEN 325 MG PO TABS
650.0000 mg | ORAL_TABLET | Freq: Three times a day (TID) | ORAL | Status: DC
  Administered 2017-02-07 – 2017-02-10 (×11): 650 mg via ORAL
  Filled 2017-02-07 (×12): qty 2

## 2017-02-07 MED ORDER — POLYETHYLENE GLYCOL 3350 17 G PO PACK: 17.0000 g | PACK | Freq: Every day | ORAL | Status: DC

## 2017-02-07 NOTE — Progress Notes (Signed)
Physical Therapy Treatment Patient Details Name: Kayla Galloway MRN: [id number] DOB: June 21, 1921 Today's Date: 02/07/2017    History of Present Illness Pt is a 83 yo F admitted to acute care with L 6 and 8 rib fx after undergoing a fall. Prior to admission, pt at modI level, using RW for ambulation. PMH includes: anemia, arthritis, hx of DVT, hx of obesity, cancer, depression, GERD, spider veins, Gout, hypercholesterolemia, hyperkalemia, HTN, OP, PVD, renal vein thrombosis, and retroperitoneal bleed.     PT Comments    Pt agreed on 3rd attempt this am.  Had pain meds prior to session.  She was more lethargic after pain medication.  She required increased assist for bed mobility and transfer this am due to lethargy. Further gait deferred for safety. Participated in exercises as described below.  Family in for session and will be able to provide 24 hour care upon discharge.  Recommended bedside commode.  They have wheelchair and walker at home.     Follow Up Recommendations  Home health PT;Supervision for mobility/OOB     Equipment Recommendations  3in1 (PT)    Recommendations for Other Services       Precautions / Restrictions Precautions Precautions: Fall Restrictions Weight Bearing Restrictions: No    Mobility  Bed Mobility Overal bed mobility: Needs Assistance Bed Mobility: Supine to Sit     Supine to sit: Mod assist     General bed mobility comments: mod assist with increased pain in L ribs.  Transfers Overall transfer level: Needs assistance Equipment used: Rolling walker (2 wheeled) Transfers: Sit to/from Stand Sit to Stand: Min assist            Ambulation/Gait Ambulation/Gait assistance: Min assist Ambulation Distance (Feet): 3 Feet Assistive device: Rolling walker (2 wheeled) Gait Pattern/deviations: Decreased step length - right;Decreased step length - left;Step-through pattern   Gait velocity interpretation: <1.8 ft/sec, indicative of risk for  recurrent falls     Stairs            Wheelchair Mobility    Modified Rankin (Stroke Patients Only)       Balance Overall balance assessment: Needs assistance Sitting-balance support: Bilateral upper extremity supported;Feet supported Sitting balance-Leahy Scale: Fair     Standing balance support: Bilateral upper extremity supported Standing balance-Leahy Scale: Fair                              Cognition Arousal/Alertness: Lethargic;Suspect due to medications Behavior During Therapy: WFL for tasks assessed/performed Overall Cognitive Status: Within Functional Limits for tasks assessed                                        Exercises Other Exercises Other Exercises: seated AROM BLE x 10 andkle pumps, LAQ, marches.    General Comments        Pertinent Vitals/Pain Pain Assessment: Faces Faces Pain Scale: Hurts even more Pain Location: L LE and ribs Pain Descriptors / Indicators: Discomfort;Aching;Constant Pain Intervention(s): Limited activity within patient's tolerance;Premedicated before session    Home Living                      Prior Function            PT Goals (current goals can now be found in the care plan section) Progress towards PT goals: Progressing toward goals  Frequency    7X/week      PT Plan Current plan remains appropriate    Co-evaluation              AM-PAC PT "6 Clicks" Daily Activity  Outcome Measure  Difficulty turning over in bed (including adjusting bedclothes, sheets and blankets)?: Total Difficulty moving from lying on back to sitting on the side of the bed? : Total Difficulty sitting down on and standing up from a chair with arms (e.g., wheelchair, bedside commode, etc,.)?: Total Help needed moving to and from a bed to chair (including a wheelchair)?: A Little Help needed walking in hospital room?: A Lot Help needed climbing 3-5 steps with a railing? : A Lot 6 Click  Score: 10    End of Session Equipment Utilized During Treatment: Gait belt Activity Tolerance: Patient limited by pain Patient left: in chair;with call bell/phone within reach;with chair alarm set;with family/visitor present   Pain - Right/Left: Left Pain - part of body: Leg     Time: 1010-1027 PT Time Calculation (min) (ACUTE ONLY): 17 min  Charges:  $Therapeutic Exercise: 8-22 mins                    G Codes:       Chesley Noon, PTA 02/07/17, 11:20 AM

## 2017-02-07 NOTE — Progress Notes (Signed)
[place] at [facility]                                                                                                                                                                                  Patient Demographics   Kayla Galloway, is a 83 y.o. female, DOB - October 23, 1920, WFU:[id number]  Admit date - 02/03/2017   Admitting Physician Lance Coon, MD  Outpatient Primary MD for the patient is [name], MD   Subjective: Pt doing better but still requiring oxygne  Review of Systems:   CONSTITUTIONAL: No documented fever. No fatigue, weakness. No weight gain, no weight loss.  EYES: No blurry or double vision.  ENT: No tinnitus. No postnasal drip. No redness of the oropharynx.  RESPIRATORY: No cough, no wheeze, no hemoptysis. No dyspnea.  CARDIOVASCULAR: No chest pain. No orthopnea. No palpitations. No syncope.  GASTROINTESTINAL: No nausea, no vomiting or diarrhea. No abdominal pain. No melena or hematochezia.  GENITOURINARY: No dysuria or hematuria.  ENDOCRINE: No polyuria or nocturia. No heat or cold intolerance.  HEMATOLOGY: No anemia. No bruising. No bleeding.  INTEGUMENTARY: No rashes. No lesions.  MUSCULOSKELETAL: Positive rib pain as well as left leg and foot pain NEUROLOGIC: No numbness, tingling, or ataxia. No seizure-type activity.  PSYCHIATRIC: No anxiety. No insomnia. No ADD.    Vitals:   Vitals:   02/06/17 1514 02/06/17 1625 02/06/17 2358 02/07/17 0726  BP: (!) 170/59 (!) 152/86 (!) 154/79 (!) 145/73  Pulse: 76  79 72  Resp:   19 17  Temp:   98.3 F (36.8 C) 97.6 F (36.4 C)  TempSrc:   Oral Oral  SpO2:   95% 93%  Weight:      Height:        Wt Readings from Last 3 Encounters:  02/04/17 57.4 kg (126 lb 9.6 oz)  12/30/16 57.6 kg (127 lb)  12/09/16 56.7 kg (125 lb)     Intake/Output Summary (Last 24 hours) at 02/07/17 1319 Last data filed at 02/07/17 0939  Gross per 24 hour  Intake              600 ml  Output                 0 ml  Net              600 ml    Physical Exam:   GENERAL: Pleasant-appearing in no apparent distress.  HEAD, EYES, EARS, NOSE AND THROAT: Atraumatic, normocephalic. Extraocular muscles are intact. Pupils equal and reactive to light. Sclerae anicteric. No conjunctival injection. No oro-pharyngeal erythema.  NECK: Supple. There is no jugular venous distention. No bruits,  no lymphadenopathy, no thyromegaly.  HEART: Regular rate and rhythm,. No murmurs, no rubs, no clicks.  LUNGS: Clear to auscultation bilaterally. No rales or rhonchi. No wheezes.  ABDOMEN: Soft, flat, nontender, nondistended. Has good bowel sounds. No hepatosplenomegaly appreciated.  EXTREMITIES: No evidence of any cyanosis, clubbing, or peripheral edema. +2 pedal and radial pulses bilaterally.  NEUROLOGIC: The patient is alert, awake, and oriented x3 with no focal motor or sensory deficits appreciated bilaterally.  SKIN: Moist and warm with no rashes appreciated.  Psych: Not anxious, depressed LN: No inguinal LN enlargement    Antibiotics   Anti-infectives    None      Medications   Scheduled Meds: . acetaminophen  650 mg Oral TID  . docusate sodium  200 mg Oral BID  . enoxaparin (LOVENOX) injection  40 mg Subcutaneous Q24H  . furosemide  20 mg Oral Daily  . hydrOXYzine  25 mg Oral BID  . loratadine  10 mg Oral Daily  . mouth rinse  15 mL Mouth Rinse BID  . oxyCODONE  10 mg Oral Q12H  . pantoprazole  40 mg Oral Daily  . polyethylene glycol  17 g Oral Daily  . ramipril  5 mg Oral Daily  . senna  1 tablet Oral Daily  . sertraline  50 mg Oral Daily   Continuous Infusions: PRN Meds:.guaiFENesin-dextromethorphan, ketorolac, ondansetron **OR** ondansetron (ZOFRAN) IV, phenol, traMADol   Data Review:   Micro Results No results found for this or any previous visit (from the past 240 hour(s)).  Radiology Reports Dg Ribs Unilateral W/chest Left  Result Date: 02/03/2017 CLINICAL DATA:  83 y/o F;  status post fall with left-sided rib pain. EXAM: LEFT RIBS AND CHEST - 3+ VIEW COMPARISON:  None. FINDINGS: Mildly displaced fractures of the left sixth and eighth ribs. No additional appreciable rib fracture identified. IMPRESSION: Mildly displaced fractures of the left sixth and eighth ribs. No additional appreciable rib fracture identified. Electronically Signed   By: Kristine Garbe M.D.   On: 02/03/2017 20:56   Dg Scapula Left  Result Date: 02/03/2017 CLINICAL DATA:  Tripped over garden hose and fell on left side. Left shoulder pain. Concern for scapular injury. Initial encounter. EXAM: LEFT SCAPULA - 2+ VIEWS COMPARISON:  Left shoulder radiographs performed earlier today at 8:16 p.m. FINDINGS: The left scapula appears grossly intact. The left humeral head remains seated at the glenoid fossa. Mild degenerative change is noted at the left acromioclavicular joint. The previously noted mildly displaced left lateral eighth rib fracture is not imaged on this study. IMPRESSION: No evidence of fracture or dislocation. Left scapula appears intact. Electronically Signed   By: Garald Balding M.D.   On: 02/03/2017 22:13   Dg Ankle Complete Left  Result Date: 02/03/2017 CLINICAL DATA:  Tripped over garden hose and fell on left side. Left ankle pain. Initial encounter. EXAM: LEFT ANKLE COMPLETE - 3+ VIEW COMPARISON:  Left tibia/fibula radiographs performed 01/12/2015 FINDINGS: There is no evidence of fracture or dislocation. The ankle mortise is intact; the interosseous space is within normal limits. No talar tilt or subluxation is seen. The joint spaces are preserved. Mild medial soft tissue swelling is noted. Diffuse vascular calcifications are seen. IMPRESSION: 1. No evidence of fracture or dislocation. 2. Diffuse vascular calcifications seen. Electronically Signed   By: Garald Balding M.D.   On: 02/03/2017 22:18   Ct Head Wo Contrast  Result Date: 02/03/2017 CLINICAL DATA:  Tripped over a garden  hose and fell onto LEFT side, complaining  of LEFT-sided shoulder pain, bruising to LEFT side of face, history hypertension EXAM: CT HEAD WITHOUT CONTRAST CT CERVICAL SPINE WITHOUT CONTRAST TECHNIQUE: Multidetector CT imaging of the head and cervical spine was performed following the standard protocol without intravenous contrast. Multiplanar CT image reconstructions of the cervical spine were also generated. COMPARISON:  CT head 09/02/2015 FINDINGS: CT HEAD FINDINGS Brain: Generalized atrophy. Normal ventricular morphology. No midline shift or mass effect. Small vessel chronic ischemic changes of deep cerebral white matter. No intracranial hemorrhage, mass lesion, evidence of acute infarction, or extra-axial fluid collection. Vascular: Atherosclerotic calcifications of the internal carotid and vertebral arteries at skullbase. Skull: Intact Sinuses/Orbits: Chronic mucosal thickening and partial opacity of the LEFT maxillary sinus with osseous wall thickening. Remaining paranasal sinuses and mastoid air cells clear Other: N/A CT CERVICAL SPINE FINDINGS Alignment: 3.3 mm of anterolisthesis at C3-C4. Less than 2 mm of retrolisthesis at C4-C5, C5-C6, and C6-C7. Skull base and vertebrae: Visualized skullbase intact. Vertebral body heights maintained. No fracture or bone destruction. Mild scattered facet degenerative changes cervical spine. Soft tissues and spinal canal: Prevertebral soft tissues normal thickness. Atherosclerotic calcifications at the carotid bifurcations bilaterally and at the great vessels in the lower cervical region. Disc levels: Multilevel disc space narrowing and scattered endplate spurs. Scattered bony neural foraminal narrowing. Upper chest: Biapical lung scarring. Other: Degenerative changes BILATERAL temporomandibular joints. IMPRESSION: Atrophy with small-vessel chronic ischemic changes of deep cerebral white matter. No acute intracranial abnormalities. Chronic LEFT maxillary sinus disease.  Osseous demineralization with degenerative disc and facet disease changes of the cervical spine associated with 3.3 mm of anterolisthesis at C3-C4 and mild degrees of retrolisthesis at C4-C5 through C6-C7. No acute fractures identified. Electronically Signed   By: Lavonia Dana M.D.   On: 02/03/2017 20:19   Ct Cervical Spine Wo Contrast  Result Date: 02/03/2017 CLINICAL DATA:  Tripped over a garden hose and fell onto LEFT side, complaining of LEFT-sided shoulder pain, bruising to LEFT side of face, history hypertension EXAM: CT HEAD WITHOUT CONTRAST CT CERVICAL SPINE WITHOUT CONTRAST TECHNIQUE: Multidetector CT imaging of the head and cervical spine was performed following the standard protocol without intravenous contrast. Multiplanar CT image reconstructions of the cervical spine were also generated. COMPARISON:  CT head 09/02/2015 FINDINGS: CT HEAD FINDINGS Brain: Generalized atrophy. Normal ventricular morphology. No midline shift or mass effect. Small vessel chronic ischemic changes of deep cerebral white matter. No intracranial hemorrhage, mass lesion, evidence of acute infarction, or extra-axial fluid collection. Vascular: Atherosclerotic calcifications of the internal carotid and vertebral arteries at skullbase. Skull: Intact Sinuses/Orbits: Chronic mucosal thickening and partial opacity of the LEFT maxillary sinus with osseous wall thickening. Remaining paranasal sinuses and mastoid air cells clear Other: N/A CT CERVICAL SPINE FINDINGS Alignment: 3.3 mm of anterolisthesis at C3-C4. Less than 2 mm of retrolisthesis at C4-C5, C5-C6, and C6-C7. Skull base and vertebrae: Visualized skullbase intact. Vertebral body heights maintained. No fracture or bone destruction. Mild scattered facet degenerative changes cervical spine. Soft tissues and spinal canal: Prevertebral soft tissues normal thickness. Atherosclerotic calcifications at the carotid bifurcations bilaterally and at the great vessels in the lower  cervical region. Disc levels: Multilevel disc space narrowing and scattered endplate spurs. Scattered bony neural foraminal narrowing. Upper chest: Biapical lung scarring. Other: Degenerative changes BILATERAL temporomandibular joints. IMPRESSION: Atrophy with small-vessel chronic ischemic changes of deep cerebral white matter. No acute intracranial abnormalities. Chronic LEFT maxillary sinus disease. Osseous demineralization with degenerative disc and facet disease changes of the cervical spine associated with  3.3 mm of anterolisthesis at C3-C4 and mild degrees of retrolisthesis at C4-C5 through C6-C7. No acute fractures identified. Electronically Signed   By: Lavonia Dana M.D.   On: 02/03/2017 20:19   Dg Chest Port 1 View  Result Date: 02/06/2017 CLINICAL DATA:  Shortness of breath.  DVT.  Fall.  Rib fractures. EXAM: PORTABLE CHEST 1 VIEW COMPARISON:  Rib series 619 2018.  Chest x-ray 01/29/2015. FINDINGS: Mediastinum hilar structures normal. Heart size normal. No pulmonary venous congestion. Left lower lobe atelectasis and consolidation with left-sided pleural effusion. No pneumothorax . Biapical pleural thickening consistent scarring. Prior lower thoracic vertebroplasty. Left lower rib fractures best identified by prior rib series. IMPRESSION: 1. Left lower lobe left lower lobe atelectasis and consolidation and associated small left pleural effusion. 2. Left lower rib fractures best identified by prior rib series. No pneumothorax . Electronically Signed   By: Marcello Moores  Register   On: 02/06/2017 07:35   Dg Shoulder Left  Result Date: 02/03/2017 CLINICAL DATA:  83 year old female with left shoulder pain. EXAM: LEFT SHOULDER - 2+ VIEW COMPARISON:  Left rib radiograph dated 02/03/2017 and the left ovary radiograph dated 02/03/2017 FINDINGS: There is no acute fracture of the left shoulder. No dislocation. There are chronic degenerative changes of the left humeral head. The bones are osteopenic. Partially  visualized minimally displaced left lateral rib fracture. This is better evaluated on the rib series radiograph. Multilevel thoracic compression deformities and vertebroplasty changes. Stable cardiac silhouette. IMPRESSION: 1. No acute fracture or dislocation of the left shoulder. 2. Minimally displaced fracture of the lateral aspect of a left rib. Electronically Signed   By: Anner Crete M.D.   On: 02/03/2017 20:58     CBC  Recent Labs Lab 02/03/17 2259 02/04/17 0509  WBC 9.5 6.7  HGB 11.3* 11.6*  HCT 33.5* 34.2*  PLT 138* 137*  MCV 96.4 96.0  MCH 32.6 32.5  MCHC 33.8 33.9  RDW 14.1 14.2  LYMPHSABS 1.2  --   MONOABS 0.6  --   EOSABS 0.0  --   BASOSABS 0.0  --     Chemistries   Recent Labs Lab 02/03/17 2259 02/04/17 0509  NA 133* 132*  K 4.1 4.2  CL 99* 98*  CO2 25 28  GLUCOSE 123* 124*  BUN 23* 19  CREATININE 0.95 0.93  CALCIUM 9.2 9.0   ------------------------------------------------------------------------------------------------------------------ estimated creatinine clearance is 34 mL/min (by C-G formula based on SCr of 0.93 mg/dL). ------------------------------------------------------------------------------------------------------------------ No results for input(s): HGBA1C in the last 72 hours. ------------------------------------------------------------------------------------------------------------------ No results for input(s): CHOL, HDL, LDLCALC, TRIG, CHOLHDL, LDLDIRECT in the last 72 hours. ------------------------------------------------------------------------------------------------------------------ No results for input(s): TSH, T4TOTAL, T3FREE, THYROIDAB in the last 72 hours.  Invalid input(s): FREET3 ------------------------------------------------------------------------------------------------------------------ No results for input(s): VITAMINB12, FOLATE, FERRITIN, TIBC, IRON, RETICCTPCT in the last 72 hours.  Coagulation profile No  results for input(s): INR, PROTIME in the last 168 hours.  No results for input(s): DDIMER in the last 72 hours.  Cardiac Enzymes No results for input(s): CKMB, TROPONINI, MYOGLOBIN in the last 168 hours.  Invalid input(s): CK ------------------------------------------------------------------------------------------------------------------ Invalid input(s): [place]  Patient is a 83 year old after a mechanical fall now in severe discomfort due to rib fractures  1.  Rib fractures - after mechanical fall,  Pain not controlled well, and she is moaning, when I saw the pt. Added long acting oxycontin to help better control.  2. Hypoxia chest x-ray shows effusion as well as atelectasis continue current therapy  Encourage chest physical therapy continue incentive spirometry  3. Essential hypertension continue ramipril  4 Depression: Continue Zoloft  5. GERD continue omeprazole  6. dvt proph: lovenox     Suspect discharge tomorrow    Code Status Orders        Start     Ordered   02/04/17 0238  Full code  Continuous     02/04/17 0237    Code Status History    Date Active Date Inactive Code Status Order ID Comments User Context   09/06/2015 12:46 PM 09/06/2015  7:07 PM Full Code [id number]  Hessie Knows, MD Inpatient   01/24/2015  2:30 PM 01/26/2015  7:07 PM Full Code [id number]  Max Sane, MD Inpatient   01/12/2015 10:48 PM 01/15/2015  2:28 PM Full Code [id number]  Aldean Jewett, MD Inpatient    Advance Directive Documentation     Most Recent Value  Type of Advance Directive  Healthcare Power of Attorney  Pre-existing out of facility DNR order (yellow form or pink MOST form)  -  "MOST" Form in Place?  -       Consults   none   DVT Prophylaxis  Lovenox   Lab Results  Component Value Date   PLT 137 (L) 02/04/2017     Time Spent in minutes   16min  Greater than 50% of time spent in care coordination and counseling patient regarding the  condition and plan of care.   Vaughan Basta M.D on 02/07/2017 at 1:19 PM  Between 7am to 6pm - Pager - [phone]  After 6pm go to [email or web address] - password [email or web address] [facility]   Office  [phone]

## 2017-02-08 DIAGNOSIS — S2239XA Fracture of one rib, unspecified side, initial encounter for closed fracture: Secondary | ICD-10-CM | POA: Diagnosis not present

## 2017-02-08 DIAGNOSIS — I10 Essential (primary) hypertension: Secondary | ICD-10-CM | POA: Diagnosis not present

## 2017-02-08 DIAGNOSIS — K219 Gastro-esophageal reflux disease without esophagitis: Secondary | ICD-10-CM | POA: Diagnosis not present

## 2017-02-08 LAB — BASIC METABOLIC PANEL
ANION GAP: 7 (ref 5–15)
BUN: 42 mg/dL — AB (ref 6–20)
CALCIUM: 8.5 mg/dL — AB (ref 8.9–10.3)
CO2: 25 mmol/L (ref 22–32)
CREATININE: 1.4 mg/dL — AB (ref 0.44–1.00)
Chloride: 93 mmol/L — ABNORMAL LOW (ref 101–111)
GFR calc Af Amer: 36 mL/min — ABNORMAL LOW (ref >60)
GFR, EST NON AFRICAN AMERICAN: 31 mL/min — AB (ref >60)
GLUCOSE: 102 mg/dL — AB (ref 65–99)
Potassium: 4.7 mmol/L (ref 3.5–5.1)
Sodium: 125 mmol/L — ABNORMAL LOW (ref 135–145)

## 2017-02-08 LAB — CBC
HEMATOCRIT: 31.8 % — AB (ref 35.0–47.0)
Hemoglobin: 11 g/dL — ABNORMAL LOW (ref 12.0–16.0)
MCH: 32.5 pg (ref 26.0–34.0)
MCHC: 34.7 g/dL (ref 32.0–36.0)
MCV: 93.6 fL (ref 80.0–100.0)
PLATELETS: 166 10*3/uL (ref 150–440)
RBC: 3.4 MIL/uL — ABNORMAL LOW (ref 3.80–5.20)
RDW: 14.2 % (ref 11.5–14.5)
WBC: 8.7 10*3/uL (ref 3.6–11.0)

## 2017-02-08 MED ORDER — SENNOSIDES-DOCUSATE SODIUM 8.6-50 MG PO TABS
1.0000 | ORAL_TABLET | Freq: Two times a day (BID) | ORAL | Status: DC
  Administered 2017-02-08 – 2017-02-10 (×4): 1 via ORAL
  Filled 2017-02-08 (×4): qty 1

## 2017-02-08 MED ORDER — OXYCODONE HCL ER 15 MG PO T12A
15.0000 mg | EXTENDED_RELEASE_TABLET | Freq: Two times a day (BID) | ORAL | Status: DC
  Administered 2017-02-08 – 2017-02-10 (×4): 15 mg via ORAL
  Filled 2017-02-08 (×4): qty 1

## 2017-02-08 MED ORDER — OXYCODONE-ACETAMINOPHEN 5-325 MG PO TABS
1.0000 | ORAL_TABLET | Freq: Four times a day (QID) | ORAL | Status: DC | PRN
  Administered 2017-02-10: 1 via ORAL
  Filled 2017-02-08: qty 1

## 2017-02-08 MED ORDER — POLYETHYLENE GLYCOL 3350 17 G PO PACK: 17.0000 g | PACK | Freq: Every day | ORAL | Status: DC | PRN

## 2017-02-08 MED ORDER — HEPARIN SODIUM (PORCINE) 5000 UNIT/ML IJ SOLN
5000.0000 [IU] | Freq: Three times a day (TID) | INTRAMUSCULAR | Status: DC
  Administered 2017-02-08 – 2017-02-10 (×5): 5000 [IU] via SUBCUTANEOUS
  Filled 2017-02-08 (×5): qty 1

## 2017-02-08 MED ORDER — SODIUM CHLORIDE 0.9 % IV SOLN
INTRAVENOUS | Status: AC
  Administered 2017-02-08 – 2017-02-09 (×2): via INTRAVENOUS

## 2017-02-08 NOTE — Progress Notes (Signed)
Physical Therapy Treatment Patient Details Name: Kayla Galloway MRN: [id number] DOB: August 23, 1920 Today's Date: 02/08/2017    History of Present Illness Pt is a 83 yo F admitted to acute care with L 6 and 8 rib fx after undergoing a fall. Prior to admission, pt at modI level, using RW for ambulation. PMH includes: anemia, arthritis, hx of DVT, hx of obesity, cancer, depression, GERD, spider veins, Gout, hypercholesterolemia, hyperkalemia, HTN, OP, PVD, renal vein thrombosis, and retroperitoneal bleed.     PT Comments    Pt in recliner this am, initially declining therapy due to pain but agrees with encouragement and education regarding importance of mobility.  She was able to stand today with min assist and ambulate 18' to door, take a seated rest then ambulate 14' back to bedside commode.  While she is able to increase her ambulation distance today, she continues to require close min assist due to balance and general weakness.    She stated she has 3 stairs into her home with 1 rail.  She feels she is unable to attempt stair training today due to pain and fatigue.  Discussed with RN.  Anticipate discharge tomorrow to home with family support.  Will need stair trials before discharge.   Follow Up Recommendations  Home health PT;Supervision for mobility/OOB     Equipment Recommendations  3in1 (PT)    Recommendations for Other Services       Precautions / Restrictions Precautions Precautions: Fall Precaution Comments: rib fx Restrictions Weight Bearing Restrictions: No    Mobility  Bed Mobility               General bed mobility comments: in chair  Transfers Overall transfer level: Needs assistance Equipment used: Rolling walker (2 wheeled) Transfers: Sit to/from Stand Sit to Stand: Min assist            Ambulation/Gait Ambulation/Gait assistance: Min assist Ambulation Distance (Feet): 18 Feet Assistive device: Rolling walker (2 wheeled) Gait  Pattern/deviations: Step-through pattern;Decreased step length - right;Decreased step length - left   Gait velocity interpretation: <1.8 ft/sec, indicative of risk for recurrent falls General Gait Details: 18' seated rest 14'   Stairs            Wheelchair Mobility    Modified Rankin (Stroke Patients Only)       Balance Overall balance assessment: Needs assistance;History of Falls Sitting-balance support: Bilateral upper extremity supported;Feet supported Sitting balance-Leahy Scale: Fair     Standing balance support: Bilateral upper extremity supported Standing balance-Leahy Scale: Fair                              Cognition Arousal/Alertness: Awake/alert Behavior During Therapy: WFL for tasks assessed/performed Overall Cognitive Status: Within Functional Limits for tasks assessed                                        Exercises      General Comments        Pertinent Vitals/Pain Pain Assessment: Faces Faces Pain Scale: Hurts even more Pain Location: L LE and ribs Pain Descriptors / Indicators: Discomfort;Aching;Constant Pain Intervention(s): Limited activity within patient's tolerance    Home Living                      Prior Function  PT Goals (current goals can now be found in the care plan section) Progress towards PT goals: Progressing toward goals    Frequency    7X/week      PT Plan Current plan remains appropriate    Co-evaluation              AM-PAC PT "6 Clicks" Daily Activity  Outcome Measure  Difficulty turning over in bed (including adjusting bedclothes, sheets and blankets)?: Total Difficulty moving from lying on back to sitting on the side of the bed? : Total Difficulty sitting down on and standing up from a chair with arms (e.g., wheelchair, bedside commode, etc,.)?: Total Help needed moving to and from a bed to chair (including a wheelchair)?: A Little Help needed  walking in hospital room?: A Little Help needed climbing 3-5 steps with a railing? : A Lot 6 Click Score: 11    End of Session Equipment Utilized During Treatment: Gait belt Activity Tolerance: Patient limited by pain Patient left: Other (comment);with call bell/phone within reach;with family/visitor present Nurse Communication: Mobility status Pain - Right/Left: Left Pain - part of body: Leg     Time: 4320-0379 PT Time Calculation (min) (ACUTE ONLY): 23 min  Charges:  $Gait Training: 8-22 mins $Therapeutic Activity: 8-22 mins                    G Codes:       Chesley Noon, PTA 02/08/17, 11:12 AM

## 2017-02-08 NOTE — Progress Notes (Signed)
[facility]                                                                                                                                                                                  Patient Demographics   Kayla Galloway, is a 83 y.o. female, DOB - [birth year], HKV:[id number]  Admit date - 02/03/2017   Admitting Physician Lance Coon, MD  Outpatient Primary MD for the patient is [name], MD   Subjective: Pt doing better but , continue to have pain.  Review of Systems:   CONSTITUTIONAL: No documented fever. No fatigue, weakness. No weight gain, no weight loss.  EYES: No blurry or double vision.  ENT: No tinnitus. No postnasal drip. No redness of the oropharynx.  RESPIRATORY: No cough, no wheeze, no hemoptysis. No dyspnea.  CARDIOVASCULAR: No chest pain. No orthopnea. No palpitations. No syncope.  GASTROINTESTINAL: No nausea, no vomiting or diarrhea. No abdominal pain. No melena or hematochezia.  GENITOURINARY: No dysuria or hematuria.  ENDOCRINE: No polyuria or nocturia. No heat or cold intolerance.  HEMATOLOGY: No anemia. No bruising. No bleeding.  INTEGUMENTARY: No rashes. No lesions.  MUSCULOSKELETAL: Positive rib pain as well as left leg and foot pain NEUROLOGIC: No numbness, tingling, or ataxia. No seizure-type activity.  PSYCHIATRIC: No anxiety. No insomnia. No ADD.    Vitals:   Vitals:   02/07/17 0726 02/07/17 1334 02/07/17 2335 02/08/17 0736  BP: (!) 145/73 (!) 120/58 128/64 126/61  Pulse: 72 87 72 74  Resp: 17 20 18    Temp: 97.6 F (36.4 C) 97.2 F (36.2 C) 98.2 F (36.8 C) 97.5 F (36.4 C)  TempSrc: Oral Axillary Oral Oral  SpO2: 93% 91% 93% 94%  Weight:      Height:        Wt Readings from Last 3 Encounters:  02/04/17 57.4 kg (126 lb 9.6 oz)  12/30/16 57.6 kg (127 lb)  12/09/16 56.7 kg (125 lb)     Intake/Output Summary (Last 24 hours) at 02/08/17 1235 Last data filed at 02/08/17 1130  Gross per 24  hour  Intake             1120 ml  Output              152 ml  Net              968 ml    Physical Exam:   GENERAL: Pleasant-appearing in no apparent distress.  HEAD, EYES, EARS, NOSE AND THROAT: Atraumatic, normocephalic. Extraocular muscles are intact. Pupils equal and reactive to light. Sclerae anicteric. No conjunctival injection. No oro-pharyngeal erythema.  NECK: Supple. There is no jugular venous  distention. No bruits, no lymphadenopathy, no thyromegaly.  HEART: Regular rate and rhythm,. No murmurs, no rubs, no clicks.  LUNGS: Clear to auscultation bilaterally. No rales or rhonchi. No wheezes.  ABDOMEN: Soft, flat, nontender, nondistended. Has good bowel sounds. No hepatosplenomegaly appreciated.  EXTREMITIES: No evidence of any cyanosis, clubbing, or peripheral edema. +2 radial pulses bilaterally.  NEUROLOGIC: The patient is alert, awake, and oriented x3 with no focal motor or sensory deficits appreciated bilaterally.  SKIN: Moist and warm with no rashes appreciated.  Psych: Not anxious, depressed LN: No inguinal LN enlargement    Antibiotics   Anti-infectives    None      Medications   Scheduled Meds: . acetaminophen  650 mg Oral TID  . docusate sodium  200 mg Oral BID  . enoxaparin (LOVENOX) injection  40 mg Subcutaneous Q24H  . hydrOXYzine  25 mg Oral BID  . loratadine  10 mg Oral Daily  . mouth rinse  15 mL Mouth Rinse BID  . oxyCODONE  15 mg Oral Q12H  . pantoprazole  40 mg Oral Daily  . polyethylene glycol  17 g Oral Daily  . ramipril  5 mg Oral Daily  . senna-docusate  1 tablet Oral BID  . sertraline  50 mg Oral Daily   Continuous Infusions: . sodium chloride     PRN Meds:.guaiFENesin-dextromethorphan, ondansetron **OR** ondansetron (ZOFRAN) IV, oxyCODONE-acetaminophen, phenol, polyethylene glycol, traMADol   Data Review:   Micro Results No results found for this or any previous visit (from the past 240 hour(s)).  Radiology Reports Dg Ribs  Unilateral W/chest Left  Result Date: 02/03/2017 CLINICAL DATA:  83 y/o F; status post fall with left-sided rib pain. EXAM: LEFT RIBS AND CHEST - 3+ VIEW COMPARISON:  None. FINDINGS: Mildly displaced fractures of the left sixth and eighth ribs. No additional appreciable rib fracture identified. IMPRESSION: Mildly displaced fractures of the left sixth and eighth ribs. No additional appreciable rib fracture identified. Electronically Signed   By: Kristine Garbe M.D.   On: 02/03/2017 20:56   Dg Scapula Left  Result Date: 02/03/2017 CLINICAL DATA:  Tripped over garden hose and fell on left side. Left shoulder pain. Concern for scapular injury. Initial encounter. EXAM: LEFT SCAPULA - 2+ VIEWS COMPARISON:  Left shoulder radiographs performed earlier today at 8:16 p.m. FINDINGS: The left scapula appears grossly intact. The left humeral head remains seated at the glenoid fossa. Mild degenerative change is noted at the left acromioclavicular joint. The previously noted mildly displaced left lateral eighth rib fracture is not imaged on this study. IMPRESSION: No evidence of fracture or dislocation. Left scapula appears intact. Electronically Signed   By: Garald Balding M.D.   On: 02/03/2017 22:13   Dg Ankle Complete Left  Result Date: 02/03/2017 CLINICAL DATA:  Tripped over garden hose and fell on left side. Left ankle pain. Initial encounter. EXAM: LEFT ANKLE COMPLETE - 3+ VIEW COMPARISON:  Left tibia/fibula radiographs performed 01/12/2015 FINDINGS: There is no evidence of fracture or dislocation. The ankle mortise is intact; the interosseous space is within normal limits. No talar tilt or subluxation is seen. The joint spaces are preserved. Mild medial soft tissue swelling is noted. Diffuse vascular calcifications are seen. IMPRESSION: 1. No evidence of fracture or dislocation. 2. Diffuse vascular calcifications seen. Electronically Signed   By: Garald Balding M.D.   On: 02/03/2017 22:18   Ct Head Wo  Contrast  Result Date: 02/03/2017 CLINICAL DATA:  Tripped over a garden hose and fell onto LEFT  side, complaining of LEFT-sided shoulder pain, bruising to LEFT side of face, history hypertension EXAM: CT HEAD WITHOUT CONTRAST CT CERVICAL SPINE WITHOUT CONTRAST TECHNIQUE: Multidetector CT imaging of the head and cervical spine was performed following the standard protocol without intravenous contrast. Multiplanar CT image reconstructions of the cervical spine were also generated. COMPARISON:  CT head 09/02/2015 FINDINGS: CT HEAD FINDINGS Brain: Generalized atrophy. Normal ventricular morphology. No midline shift or mass effect. Small vessel chronic ischemic changes of deep cerebral white matter. No intracranial hemorrhage, mass lesion, evidence of acute infarction, or extra-axial fluid collection. Vascular: Atherosclerotic calcifications of the internal carotid and vertebral arteries at skullbase. Skull: Intact Sinuses/Orbits: Chronic mucosal thickening and partial opacity of the LEFT maxillary sinus with osseous wall thickening. Remaining paranasal sinuses and mastoid air cells clear Other: N/A CT CERVICAL SPINE FINDINGS Alignment: 3.3 mm of anterolisthesis at C3-C4. Less than 2 mm of retrolisthesis at C4-C5, C5-C6, and C6-C7. Skull base and vertebrae: Visualized skullbase intact. Vertebral body heights maintained. No fracture or bone destruction. Mild scattered facet degenerative changes cervical spine. Soft tissues and spinal canal: Prevertebral soft tissues normal thickness. Atherosclerotic calcifications at the carotid bifurcations bilaterally and at the great vessels in the lower cervical region. Disc levels: Multilevel disc space narrowing and scattered endplate spurs. Scattered bony neural foraminal narrowing. Upper chest: Biapical lung scarring. Other: Degenerative changes BILATERAL temporomandibular joints. IMPRESSION: Atrophy with small-vessel chronic ischemic changes of deep cerebral white matter. No  acute intracranial abnormalities. Chronic LEFT maxillary sinus disease. Osseous demineralization with degenerative disc and facet disease changes of the cervical spine associated with 3.3 mm of anterolisthesis at C3-C4 and mild degrees of retrolisthesis at C4-C5 through C6-C7. No acute fractures identified. Electronically Signed   By: Lavonia Dana M.D.   On: 02/03/2017 20:19   Ct Cervical Spine Wo Contrast  Result Date: 02/03/2017 CLINICAL DATA:  Tripped over a garden hose and fell onto LEFT side, complaining of LEFT-sided shoulder pain, bruising to LEFT side of face, history hypertension EXAM: CT HEAD WITHOUT CONTRAST CT CERVICAL SPINE WITHOUT CONTRAST TECHNIQUE: Multidetector CT imaging of the head and cervical spine was performed following the standard protocol without intravenous contrast. Multiplanar CT image reconstructions of the cervical spine were also generated. COMPARISON:  CT head 09/02/2015 FINDINGS: CT HEAD FINDINGS Brain: Generalized atrophy. Normal ventricular morphology. No midline shift or mass effect. Small vessel chronic ischemic changes of deep cerebral white matter. No intracranial hemorrhage, mass lesion, evidence of acute infarction, or extra-axial fluid collection. Vascular: Atherosclerotic calcifications of the internal carotid and vertebral arteries at skullbase. Skull: Intact Sinuses/Orbits: Chronic mucosal thickening and partial opacity of the LEFT maxillary sinus with osseous wall thickening. Remaining paranasal sinuses and mastoid air cells clear Other: N/A CT CERVICAL SPINE FINDINGS Alignment: 3.3 mm of anterolisthesis at C3-C4. Less than 2 mm of retrolisthesis at C4-C5, C5-C6, and C6-C7. Skull base and vertebrae: Visualized skullbase intact. Vertebral body heights maintained. No fracture or bone destruction. Mild scattered facet degenerative changes cervical spine. Soft tissues and spinal canal: Prevertebral soft tissues normal thickness. Atherosclerotic calcifications at the  carotid bifurcations bilaterally and at the great vessels in the lower cervical region. Disc levels: Multilevel disc space narrowing and scattered endplate spurs. Scattered bony neural foraminal narrowing. Upper chest: Biapical lung scarring. Other: Degenerative changes BILATERAL temporomandibular joints. IMPRESSION: Atrophy with small-vessel chronic ischemic changes of deep cerebral white matter. No acute intracranial abnormalities. Chronic LEFT maxillary sinus disease. Osseous demineralization with degenerative disc and facet disease changes of the cervical spine  associated with 3.3 mm of anterolisthesis at C3-C4 and mild degrees of retrolisthesis at C4-C5 through C6-C7. No acute fractures identified. Electronically Signed   By: Lavonia Dana M.D.   On: 02/03/2017 20:19   Dg Chest Port 1 View  Result Date: 02/06/2017 CLINICAL DATA:  Shortness of breath.  DVT.  Fall.  Rib fractures. EXAM: PORTABLE CHEST 1 VIEW COMPARISON:  Rib series 619 2018.  Chest x-ray 01/29/2015. FINDINGS: Mediastinum hilar structures normal. Heart size normal. No pulmonary venous congestion. Left lower lobe atelectasis and consolidation with left-sided pleural effusion. No pneumothorax . Biapical pleural thickening consistent scarring. Prior lower thoracic vertebroplasty. Left lower rib fractures best identified by prior rib series. IMPRESSION: 1. Left lower lobe left lower lobe atelectasis and consolidation and associated small left pleural effusion. 2. Left lower rib fractures best identified by prior rib series. No pneumothorax . Electronically Signed   By: Marcello Moores  Register   On: 02/06/2017 07:35   Dg Shoulder Left  Result Date: 02/03/2017 CLINICAL DATA:  83 year old female with left shoulder pain. EXAM: LEFT SHOULDER - 2+ VIEW COMPARISON:  Left rib radiograph dated 02/03/2017 and the left ovary radiograph dated 02/03/2017 FINDINGS: There is no acute fracture of the left shoulder. No dislocation. There are chronic degenerative  changes of the left humeral head. The bones are osteopenic. Partially visualized minimally displaced left lateral rib fracture. This is better evaluated on the rib series radiograph. Multilevel thoracic compression deformities and vertebroplasty changes. Stable cardiac silhouette. IMPRESSION: 1. No acute fracture or dislocation of the left shoulder. 2. Minimally displaced fracture of the lateral aspect of a left rib. Electronically Signed   By: Anner Crete M.D.   On: 02/03/2017 20:58     CBC  Recent Labs Lab 02/03/17 2259 02/04/17 0509 02/08/17 1028  WBC 9.5 6.7 8.7  HGB 11.3* 11.6* 11.0*  HCT 33.5* 34.2* 31.8*  PLT 138* 137* 166  MCV 96.4 96.0 93.6  MCH 32.6 32.5 32.5  MCHC 33.8 33.9 34.7  RDW 14.1 14.2 14.2  LYMPHSABS 1.2  --   --   MONOABS 0.6  --   --   EOSABS 0.0  --   --   BASOSABS 0.0  --   --     Chemistries   Recent Labs Lab 02/03/17 2259 02/04/17 0509 02/08/17 1028  NA 133* 132* 125*  K 4.1 4.2 4.7  CL 99* 98* 93*  CO2 25 28 25   GLUCOSE 123* 124* 102*  BUN 23* 19 42*  CREATININE 0.95 0.93 1.40*  CALCIUM 9.2 9.0 8.5*   ------------------------------------------------------------------------------------------------------------------ estimated creatinine clearance is 22.6 mL/min (A) (by C-G formula based on SCr of 1.4 mg/dL (H)). ------------------------------------------------------------------------------------------------------------------ No results for input(s): HGBA1C in the last 72 hours. ------------------------------------------------------------------------------------------------------------------ No results for input(s): CHOL, HDL, LDLCALC, TRIG, CHOLHDL, LDLDIRECT in the last 72 hours. ------------------------------------------------------------------------------------------------------------------ No results for input(s): TSH, T4TOTAL, T3FREE, THYROIDAB in the last 72 hours.  Invalid input(s):  FREET3 ------------------------------------------------------------------------------------------------------------------ No results for input(s): VITAMINB12, FOLATE, FERRITIN, TIBC, IRON, RETICCTPCT in the last 72 hours.  Coagulation profile No results for input(s): INR, PROTIME in the last 168 hours.  No results for input(s): DDIMER in the last 72 hours.  Cardiac Enzymes No results for input(s): CKMB, TROPONINI, MYOGLOBIN in the last 168 hours.  Invalid input(s): CK ------------------------------------------------------------------------------------------------------------------ Invalid input(s): [place]  Patient is a 83 year old after a mechanical fall now in severe discomfort due to rib fractures  1.  Rib fractures - after mechanical fall,  Pain not controlled well, and she is moaning, when I saw the pt. Added long acting oxycontin to help better control.   Still had pain- increase oxycontin dose and add percocet for breakthrough pain.  2. Hypoxia chest x-ray shows effusion as well as atelectasis continue current therapy   Encourage chest physical therapy continue incentive spirometry   On room air now.  3. Essential hypertension continue ramipril  4 Depression: Continue Zoloft  5. GERD continue omeprazole  6. dvt proph: lovenox  7. Constipation- give oral constipation meds.     Suspect discharge tomorrow    Code Status Orders        Start     Ordered   02/04/17 0238  Full code  Continuous     02/04/17 0237    Code Status History    Date Active Date Inactive Code Status Order ID Comments User Context   09/06/2015 12:46 PM 09/06/2015  7:07 PM Full Code [id number]  Hessie Knows, MD Inpatient   01/24/2015  2:30 PM 01/26/2015  7:07 PM Full Code [id number]  Max Sane, MD Inpatient   01/12/2015 10:48 PM 01/15/2015  2:28 PM Full Code [id number]  Aldean Jewett, MD Inpatient    Advance Directive Documentation     Most Recent Value  Type of  Advance Directive  Healthcare Power of Attorney  Pre-existing out of facility DNR order (yellow form or pink MOST form)  -  "MOST" Form in Place?  -     Consults   none   DVT Prophylaxis  Lovenox   Lab Results  Component Value Date   PLT 166 02/08/2017     Time Spent in minutes   35 min  Greater than 50% of time spent in care coordination and counseling patient regarding the condition and plan of care.   Vaughan Basta M.D on 02/08/2017 at 12:35 PM  Between 7am to 6pm - Pager - [phone]  After 6pm go to [email or web address] - password [email or web address] [facility]   Office  [phone]

## 2017-02-08 NOTE — Telephone Encounter (Signed)
Needs TCM call and need to know how pt doing.  Will need to find a f/u appt for her.

## 2017-02-08 NOTE — Progress Notes (Signed)
83 y/o F ordered Lovenox for DVT prophylaxis.   Lab Results  Component Value Date   CREATININE 1.40 (H) 02/08/2017   CREATININE 0.93 02/04/2017   CREATININE 0.95 02/03/2017     Estimated Creatinine Clearance: 22.6 mL/min (A) (by C-G formula based on SCr of 1.4 mg/dL (H)).  Filed Weights   02/03/17 1940 02/04/17 0307  Weight: 123 lb (55.8 kg) 126 lb 9.6 oz (57.4 kg)   Body mass index is 21.07 kg/m.  Will convert patient to [facility], [place] for significantly increased SCr.   Ulice Dash, PharmD Clinical Pharmacist

## 2017-02-09 ENCOUNTER — Telehealth: Payer: Self-pay | Admitting: Internal Medicine

## 2017-02-09 DIAGNOSIS — R079 Chest pain, unspecified: Secondary | ICD-10-CM | POA: Diagnosis present

## 2017-02-09 DIAGNOSIS — E871 Hypo-osmolality and hyponatremia: Secondary | ICD-10-CM | POA: Diagnosis present

## 2017-02-09 DIAGNOSIS — Z85828 Personal history of other malignant neoplasm of skin: Secondary | ICD-10-CM | POA: Diagnosis not present

## 2017-02-09 DIAGNOSIS — Z7901 Long term (current) use of anticoagulants: Secondary | ICD-10-CM | POA: Diagnosis not present

## 2017-02-09 DIAGNOSIS — Z880 Allergy status to penicillin: Secondary | ICD-10-CM | POA: Diagnosis not present

## 2017-02-09 DIAGNOSIS — S0181XD Laceration without foreign body of other part of head, subsequent encounter: Secondary | ICD-10-CM | POA: Diagnosis not present

## 2017-02-09 DIAGNOSIS — I10 Essential (primary) hypertension: Secondary | ICD-10-CM | POA: Diagnosis not present

## 2017-02-09 DIAGNOSIS — K219 Gastro-esophageal reflux disease without esophagitis: Secondary | ICD-10-CM | POA: Diagnosis not present

## 2017-02-09 DIAGNOSIS — S2239XA Fracture of one rib, unspecified side, initial encounter for closed fracture: Secondary | ICD-10-CM | POA: Diagnosis not present

## 2017-02-09 DIAGNOSIS — M1990 Unspecified osteoarthritis, unspecified site: Secondary | ICD-10-CM | POA: Diagnosis present

## 2017-02-09 DIAGNOSIS — W010XXA Fall on same level from slipping, tripping and stumbling without subsequent striking against object, initial encounter: Secondary | ICD-10-CM | POA: Diagnosis present

## 2017-02-09 DIAGNOSIS — S2242XD Multiple fractures of ribs, left side, subsequent encounter for fracture with routine healing: Secondary | ICD-10-CM | POA: Diagnosis not present

## 2017-02-09 DIAGNOSIS — Z881 Allergy status to other antibiotic agents status: Secondary | ICD-10-CM | POA: Diagnosis not present

## 2017-02-09 DIAGNOSIS — M109 Gout, unspecified: Secondary | ICD-10-CM | POA: Diagnosis present

## 2017-02-09 DIAGNOSIS — J9811 Atelectasis: Secondary | ICD-10-CM | POA: Diagnosis present

## 2017-02-09 DIAGNOSIS — W19XXXD Unspecified fall, subsequent encounter: Secondary | ICD-10-CM | POA: Diagnosis not present

## 2017-02-09 DIAGNOSIS — Z888 Allergy status to other drugs, medicaments and biological substances status: Secondary | ICD-10-CM | POA: Diagnosis not present

## 2017-02-09 DIAGNOSIS — S2242XA Multiple fractures of ribs, left side, initial encounter for closed fracture: Secondary | ICD-10-CM | POA: Diagnosis present

## 2017-02-09 DIAGNOSIS — Z886 Allergy status to analgesic agent status: Secondary | ICD-10-CM | POA: Diagnosis not present

## 2017-02-09 DIAGNOSIS — R1010 Upper abdominal pain, unspecified: Secondary | ICD-10-CM | POA: Diagnosis not present

## 2017-02-09 DIAGNOSIS — Z885 Allergy status to narcotic agent status: Secondary | ICD-10-CM | POA: Diagnosis not present

## 2017-02-09 DIAGNOSIS — Z86718 Personal history of other venous thrombosis and embolism: Secondary | ICD-10-CM | POA: Diagnosis not present

## 2017-02-09 DIAGNOSIS — F329 Major depressive disorder, single episode, unspecified: Secondary | ICD-10-CM | POA: Diagnosis present

## 2017-02-09 DIAGNOSIS — Z79899 Other long term (current) drug therapy: Secondary | ICD-10-CM | POA: Diagnosis not present

## 2017-02-09 DIAGNOSIS — E7800 Pure hypercholesterolemia, unspecified: Secondary | ICD-10-CM | POA: Diagnosis present

## 2017-02-09 DIAGNOSIS — Z7401 Bed confinement status: Secondary | ICD-10-CM | POA: Diagnosis not present

## 2017-02-09 DIAGNOSIS — M6281 Muscle weakness (generalized): Secondary | ICD-10-CM | POA: Diagnosis not present

## 2017-02-09 LAB — BASIC METABOLIC PANEL
Anion gap: 5 (ref 5–15)
BUN: 41 mg/dL — ABNORMAL HIGH (ref 6–20)
CALCIUM: 8 mg/dL — AB (ref 8.9–10.3)
CO2: 25 mmol/L (ref 22–32)
CREATININE: 1.04 mg/dL — AB (ref 0.44–1.00)
Chloride: 96 mmol/L — ABNORMAL LOW (ref 101–111)
GFR calc non Af Amer: 45 mL/min — ABNORMAL LOW (ref >60)
GFR, EST AFRICAN AMERICAN: 52 mL/min — AB (ref >60)
Glucose, Bld: 93 mg/dL (ref 65–99)
Potassium: 4.9 mmol/L (ref 3.5–5.1)
Sodium: 126 mmol/L — ABNORMAL LOW (ref 135–145)

## 2017-02-09 MED ORDER — RAMIPRIL 10 MG PO CAPS
10.0000 mg | ORAL_CAPSULE | Freq: Every day | ORAL | Status: DC
  Administered 2017-02-10: 10 mg via ORAL
  Filled 2017-02-09: qty 1

## 2017-02-09 MED ORDER — RAMIPRIL 5 MG PO CAPS
5.0000 mg | ORAL_CAPSULE | Freq: Once | ORAL | Status: AC
  Administered 2017-02-09: 5 mg via ORAL
  Filled 2017-02-09: qty 1

## 2017-02-09 NOTE — Telephone Encounter (Signed)
Pt nephew called wanting to speak to [employer]) regarding the step down to a senior home. He has some questions to talk further about it. Thank you!  Call pt @ [phone]

## 2017-02-09 NOTE — Telephone Encounter (Signed)
Just FYI, Patient discharge was canceled on 02/05/17 patient maybe discharged today.

## 2017-02-09 NOTE — Progress Notes (Signed)
Kayla Galloway at [facility]                                                                                                                                                                                  Patient Demographics   Kayla Galloway, is a 83 y.o. female, DOB - [birth year], OAC:[id number]  Admit date - 02/03/2017   Admitting Physician Lance Coon, MD  Outpatient Primary MD for the patient is [name], MD   Subjective: Pt doing better but , continue to have pain. Patient's family is in the room and they are concerned with her decreased functional ability and shortness of breath on ambulation.  Review of Systems:   CONSTITUTIONAL: No documented fever. No fatigue, weakness. No weight gain, no weight loss.  EYES: No blurry or double vision.  ENT: No tinnitus. No postnasal drip. No redness of the oropharynx.  RESPIRATORY: No cough, no wheeze, no hemoptysis. No dyspnea.  CARDIOVASCULAR: No chest pain. No orthopnea. No palpitations. No syncope.  GASTROINTESTINAL: No nausea, no vomiting or diarrhea. No abdominal pain. No melena or hematochezia.  GENITOURINARY: No dysuria or hematuria.  ENDOCRINE: No polyuria or nocturia. No heat or cold intolerance.  HEMATOLOGY: No anemia. No bruising. No bleeding.  INTEGUMENTARY: No rashes. No lesions.  MUSCULOSKELETAL: Positive rib pain as well as left leg and foot pain NEUROLOGIC: No numbness, tingling, or ataxia. No seizure-type activity.  PSYCHIATRIC: No anxiety. No insomnia. No ADD.    Vitals:   Vitals:   02/07/17 2335 02/08/17 0736 02/08/17 1435 02/09/17 0756  BP: 128/64 126/61 (!) 115/45 (!) 169/58  Pulse: 72 74 71 67  Resp: 18  18 18   Temp:  97.5 F (36.4 C) 98 F (36.7 C) 97.6 F (36.4 C)  TempSrc: Oral Oral Oral Axillary  SpO2: 93% 94% 97% 97%  Weight:      Height:        Wt Readings from Last 3 Encounters:  02/04/17 57.4 kg (126 lb 9.6 oz)  12/30/16 57.6 kg (127 lb)  12/09/16 56.7 kg (125  lb)     Intake/Output Summary (Last 24 hours) at 02/09/17 1413 Last data filed at 02/09/17 1319  Gross per 24 hour  Intake          1176.67 ml  Output              200 ml  Net           976.67 ml    Physical Exam:   GENERAL: Pleasant-appearing in no apparent distress.  HEAD, EYES, EARS, NOSE AND THROAT: Atraumatic, normocephalic. Extraocular muscles are intact. Pupils equal and reactive to light. Sclerae anicteric. No conjunctival  injection. No oro-pharyngeal erythema.  NECK: Supple. There is no jugular venous distention. No bruits, no lymphadenopathy, no thyromegaly.  HEART: Regular rate and rhythm,. No murmurs, no rubs, no clicks.  LUNGS: Clear to auscultation bilaterally. No rales or rhonchi. No wheezes.  ABDOMEN: Soft, flat, nontender, nondistended. Has good bowel sounds. No hepatosplenomegaly appreciated.  EXTREMITIES: No evidence of any cyanosis, clubbing, or peripheral edema. +2 radial pulses bilaterally.  NEUROLOGIC: The patient is alert, awake, and oriented x3 with no focal motor or sensory deficits appreciated bilaterally. Generalized weakness. SKIN: Moist and warm with no rashes appreciated.  Psych: Not anxious, depressed LN: No inguinal LN enlargement    Antibiotics   Anti-infectives    None      Medications   Scheduled Meds: . acetaminophen  650 mg Oral TID  . docusate sodium  200 mg Oral BID  . heparin subcutaneous  5,000 Units Subcutaneous Q8H  . hydrOXYzine  25 mg Oral BID  . loratadine  10 mg Oral Daily  . mouth rinse  15 mL Mouth Rinse BID  . oxyCODONE  15 mg Oral Q12H  . pantoprazole  40 mg Oral Daily  . polyethylene glycol  17 g Oral Daily  . ramipril  5 mg Oral Daily  . senna-docusate  1 tablet Oral BID  . sertraline  50 mg Oral Daily   Continuous Infusions:  PRN Meds:.guaiFENesin-dextromethorphan, ondansetron **OR** ondansetron (ZOFRAN) IV, oxyCODONE-acetaminophen, phenol, polyethylene glycol, traMADol   Data Review:   Micro Results No  results found for this or any previous visit (from the past 240 hour(s)).  Radiology Reports Dg Ribs Unilateral W/chest Left  Result Date: 02/03/2017 CLINICAL DATA:  83 y/o F; status post fall with left-sided rib pain. EXAM: LEFT RIBS AND CHEST - 3+ VIEW COMPARISON:  None. FINDINGS: Mildly displaced fractures of the left sixth and eighth ribs. No additional appreciable rib fracture identified. IMPRESSION: Mildly displaced fractures of the left sixth and eighth ribs. No additional appreciable rib fracture identified. Electronically Signed   By: Kristine Garbe M.D.   On: 02/03/2017 20:56   Dg Scapula Left  Result Date: 02/03/2017 CLINICAL DATA:  Tripped over garden hose and fell on left side. Left shoulder pain. Concern for scapular injury. Initial encounter. EXAM: LEFT SCAPULA - 2+ VIEWS COMPARISON:  Left shoulder radiographs performed earlier today at 8:16 p.m. FINDINGS: The left scapula appears grossly intact. The left humeral head remains seated at the glenoid fossa. Mild degenerative change is noted at the left acromioclavicular joint. The previously noted mildly displaced left lateral eighth rib fracture is not imaged on this study. IMPRESSION: No evidence of fracture or dislocation. Left scapula appears intact. Electronically Signed   By: Garald Balding M.D.   On: 02/03/2017 22:13   Dg Ankle Complete Left  Result Date: 02/03/2017 CLINICAL DATA:  Tripped over garden hose and fell on left side. Left ankle pain. Initial encounter. EXAM: LEFT ANKLE COMPLETE - 3+ VIEW COMPARISON:  Left tibia/fibula radiographs performed 01/12/2015 FINDINGS: There is no evidence of fracture or dislocation. The ankle mortise is intact; the interosseous space is within normal limits. No talar tilt or subluxation is seen. The joint spaces are preserved. Mild medial soft tissue swelling is noted. Diffuse vascular calcifications are seen. IMPRESSION: 1. No evidence of fracture or dislocation. 2. Diffuse vascular  calcifications seen. Electronically Signed   By: Garald Balding M.D.   On: 02/03/2017 22:18   Ct Head Wo Contrast  Result Date: 02/03/2017 CLINICAL DATA:  Tripped over a  garden hose and fell onto LEFT side, complaining of LEFT-sided shoulder pain, bruising to LEFT side of face, history hypertension EXAM: CT HEAD WITHOUT CONTRAST CT CERVICAL SPINE WITHOUT CONTRAST TECHNIQUE: Multidetector CT imaging of the head and cervical spine was performed following the standard protocol without intravenous contrast. Multiplanar CT image reconstructions of the cervical spine were also generated. COMPARISON:  CT head 09/02/2015 FINDINGS: CT HEAD FINDINGS Brain: Generalized atrophy. Normal ventricular morphology. No midline shift or mass effect. Small vessel chronic ischemic changes of deep cerebral white matter. No intracranial hemorrhage, mass lesion, evidence of acute infarction, or extra-axial fluid collection. Vascular: Atherosclerotic calcifications of the internal carotid and vertebral arteries at skullbase. Skull: Intact Sinuses/Orbits: Chronic mucosal thickening and partial opacity of the LEFT maxillary sinus with osseous wall thickening. Remaining paranasal sinuses and mastoid air cells clear Other: N/A CT CERVICAL SPINE FINDINGS Alignment: 3.3 mm of anterolisthesis at C3-C4. Less than 2 mm of retrolisthesis at C4-C5, C5-C6, and C6-C7. Skull base and vertebrae: Visualized skullbase intact. Vertebral body heights maintained. No fracture or bone destruction. Mild scattered facet degenerative changes cervical spine. Soft tissues and spinal canal: Prevertebral soft tissues normal thickness. Atherosclerotic calcifications at the carotid bifurcations bilaterally and at the great vessels in the lower cervical region. Disc levels: Multilevel disc space narrowing and scattered endplate spurs. Scattered bony neural foraminal narrowing. Upper chest: Biapical lung scarring. Other: Degenerative changes BILATERAL temporomandibular  joints. IMPRESSION: Atrophy with small-vessel chronic ischemic changes of deep cerebral white matter. No acute intracranial abnormalities. Chronic LEFT maxillary sinus disease. Osseous demineralization with degenerative disc and facet disease changes of the cervical spine associated with 3.3 mm of anterolisthesis at C3-C4 and mild degrees of retrolisthesis at C4-C5 through C6-C7. No acute fractures identified. Electronically Signed   By: Lavonia Dana M.D.   On: 02/03/2017 20:19   Ct Cervical Spine Wo Contrast  Result Date: 02/03/2017 CLINICAL DATA:  Tripped over a garden hose and fell onto LEFT side, complaining of LEFT-sided shoulder pain, bruising to LEFT side of face, history hypertension EXAM: CT HEAD WITHOUT CONTRAST CT CERVICAL SPINE WITHOUT CONTRAST TECHNIQUE: Multidetector CT imaging of the head and cervical spine was performed following the standard protocol without intravenous contrast. Multiplanar CT image reconstructions of the cervical spine were also generated. COMPARISON:  CT head 09/02/2015 FINDINGS: CT HEAD FINDINGS Brain: Generalized atrophy. Normal ventricular morphology. No midline shift or mass effect. Small vessel chronic ischemic changes of deep cerebral white matter. No intracranial hemorrhage, mass lesion, evidence of acute infarction, or extra-axial fluid collection. Vascular: Atherosclerotic calcifications of the internal carotid and vertebral arteries at skullbase. Skull: Intact Sinuses/Orbits: Chronic mucosal thickening and partial opacity of the LEFT maxillary sinus with osseous wall thickening. Remaining paranasal sinuses and mastoid air cells clear Other: N/A CT CERVICAL SPINE FINDINGS Alignment: 3.3 mm of anterolisthesis at C3-C4. Less than 2 mm of retrolisthesis at C4-C5, C5-C6, and C6-C7. Skull base and vertebrae: Visualized skullbase intact. Vertebral body heights maintained. No fracture or bone destruction. Mild scattered facet degenerative changes cervical spine. Soft tissues  and spinal canal: Prevertebral soft tissues normal thickness. Atherosclerotic calcifications at the carotid bifurcations bilaterally and at the great vessels in the lower cervical region. Disc levels: Multilevel disc space narrowing and scattered endplate spurs. Scattered bony neural foraminal narrowing. Upper chest: Biapical lung scarring. Other: Degenerative changes BILATERAL temporomandibular joints. IMPRESSION: Atrophy with small-vessel chronic ischemic changes of deep cerebral white matter. No acute intracranial abnormalities. Chronic LEFT maxillary sinus disease. Osseous demineralization with degenerative disc and facet  disease changes of the cervical spine associated with 3.3 mm of anterolisthesis at C3-C4 and mild degrees of retrolisthesis at C4-C5 through C6-C7. No acute fractures identified. Electronically Signed   By: Lavonia Dana M.D.   On: 02/03/2017 20:19   Dg Chest Port 1 View  Result Date: 02/06/2017 CLINICAL DATA:  Shortness of breath.  DVT.  Fall.  Rib fractures. EXAM: PORTABLE CHEST 1 VIEW COMPARISON:  Rib series 619 2018.  Chest x-ray 01/29/2015. FINDINGS: Mediastinum hilar structures normal. Heart size normal. No pulmonary venous congestion. Left lower lobe atelectasis and consolidation with left-sided pleural effusion. No pneumothorax . Biapical pleural thickening consistent scarring. Prior lower thoracic vertebroplasty. Left lower rib fractures best identified by prior rib series. IMPRESSION: 1. Left lower lobe left lower lobe atelectasis and consolidation and associated small left pleural effusion. 2. Left lower rib fractures best identified by prior rib series. No pneumothorax . Electronically Signed   By: Marcello Moores  Register   On: 02/06/2017 07:35   Dg Shoulder Left  Result Date: 02/03/2017 CLINICAL DATA:  83 year old female with left shoulder pain. EXAM: LEFT SHOULDER - 2+ VIEW COMPARISON:  Left rib radiograph dated 02/03/2017 and the left ovary radiograph dated 02/03/2017 FINDINGS:  There is no acute fracture of the left shoulder. No dislocation. There are chronic degenerative changes of the left humeral head. The bones are osteopenic. Partially visualized minimally displaced left lateral rib fracture. This is better evaluated on the rib series radiograph. Multilevel thoracic compression deformities and vertebroplasty changes. Stable cardiac silhouette. IMPRESSION: 1. No acute fracture or dislocation of the left shoulder. 2. Minimally displaced fracture of the lateral aspect of a left rib. Electronically Signed   By: Anner Crete M.D.   On: 02/03/2017 20:58     CBC  Recent Labs Lab 02/03/17 2259 02/04/17 0509 02/08/17 1028  WBC 9.5 6.7 8.7  HGB 11.3* 11.6* 11.0*  HCT 33.5* 34.2* 31.8*  PLT 138* 137* 166  MCV 96.4 96.0 93.6  MCH 32.6 32.5 32.5  MCHC 33.8 33.9 34.7  RDW 14.1 14.2 14.2  LYMPHSABS 1.2  --   --   MONOABS 0.6  --   --   EOSABS 0.0  --   --   BASOSABS 0.0  --   --     Chemistries   Recent Labs Lab 02/03/17 2259 02/04/17 0509 02/08/17 1028 02/09/17 0327  NA 133* 132* 125* 126*  K 4.1 4.2 4.7 4.9  CL 99* 98* 93* 96*  CO2 25 28 25 25   GLUCOSE 123* 124* 102* 93  BUN 23* 19 42* 41*  CREATININE 0.95 0.93 1.40* 1.04*  CALCIUM 9.2 9.0 8.5* 8.0*   ------------------------------------------------------------------------------------------------------------------ estimated creatinine clearance is 30.4 mL/min (A) (by C-G formula based on SCr of 1.04 mg/dL (H)). ------------------------------------------------------------------------------------------------------------------ No results for input(s): HGBA1C in the last 72 hours. ------------------------------------------------------------------------------------------------------------------ No results for input(s): CHOL, HDL, LDLCALC, TRIG, CHOLHDL, LDLDIRECT in the last 72 hours. ------------------------------------------------------------------------------------------------------------------ No  results for input(s): TSH, T4TOTAL, T3FREE, THYROIDAB in the last 72 hours.  Invalid input(s): FREET3 ------------------------------------------------------------------------------------------------------------------ No results for input(s): VITAMINB12, FOLATE, FERRITIN, TIBC, IRON, RETICCTPCT in the last 72 hours.  Coagulation profile No results for input(s): INR, PROTIME in the last 168 hours.  No results for input(s): DDIMER in the last 72 hours.  Cardiac Enzymes No results for input(s): CKMB, TROPONINI, MYOGLOBIN in the last 168 hours.  Invalid input(s): CK ------------------------------------------------------------------------------------------------------------------ Invalid input(s): [place]  Patient is a 83 year old after a mechanical fall now in  severe discomfort due to rib fractures  #  Rib fractures - after mechanical fall,  Pain not controlled well, as per her.  Added long acting oxycontin to help better control.  Still had pain- increase oxycontin dose and add percocet for breakthrough pain.   Pt did not ask for any percocet, I encouraged her to ask the nurse for pain meds, if she have pain;  # Hypoxia chest x-ray shows effusion as well as atelectasis continue current therapy   Encourage chest physical therapy continue incentive spirometry   On room air now.  # hyponatremia    Likely due to lasix use.   NS and hydration now, monitor.    Also had some Ac renal failure- but it improved with fluids.    # Essential hypertension continue ramipril  # Depression: Continue Zoloft  # GERD continue omeprazole  # dvt proph: lovenox  # Constipation- give oral constipation meds.     Suspect discharge tomorrow    Code Status Orders        Start     Ordered   02/04/17 0238  Full code  Continuous     02/04/17 0237    Code Status History    Date Active Date Inactive Code Status Order ID Comments User Context   09/06/2015 12:46 PM 09/06/2015   7:07 PM Full Code [id number]  Hessie Knows, MD Inpatient   01/24/2015  2:30 PM 01/26/2015  7:07 PM Full Code [id number]  Max Sane, MD Inpatient   01/12/2015 10:48 PM 01/15/2015  2:28 PM Full Code [id number]  Aldean Jewett, MD Inpatient    Advance Directive Documentation     Most Recent Value  Type of Advance Directive  Healthcare Power of Attorney  Pre-existing out of facility DNR order (yellow form or pink MOST form)  -  "MOST" Form in Place?  -     Consults   none   DVT Prophylaxis  Lovenox   Lab Results  Component Value Date   PLT 166 02/08/2017     Time Spent in minutes   35 min  Greater than 50% of time spent in care coordination and counseling patient regarding the condition and plan of care.   Vaughan Basta M.D on 02/09/2017 at 2:13 PM  Between 7am to 6pm - Pager - [phone]  After 6pm go to [email or web address] - password [email or web address] [facility]   Office  [phone]

## 2017-02-09 NOTE — NC FL2 (Signed)
[place] LEVEL OF CARE SCREENING TOOL     IDENTIFICATION  Patient Name: Kayla Galloway Birthdate: February 25, 1921 Sex: female Admission Date (Current Location): 02/03/2017  [place] and [place] Number:  Engineering geologist and Address:  [facility], [address] [place], [place] [address]      Provider Number: [id number]  Attending Physician Name and Address:  Vaughan Basta, *  Relative Name and Phone Number:       Current Level of Care: Hospital Recommended Level of Care: [place] Prior Approval Number:    Date Approved/Denied:   PASRR Number:  [id number] )  Discharge Plan: SNF    Current Diagnoses: Patient Active Problem List   Diagnosis Date Noted  . Hyponatremia 02/09/2017  . Rib fractures 02/04/2017  . GERD (gastroesophageal reflux disease) 02/04/2017  . Skin lesions 11/09/2016  . Bilateral lower extremity edema 11/04/2016  . Swelling of limb 08/22/2016  . Postphlebitic syndrome without complication 57/84/6962  . S/P IVC filter 06/24/2016  . Swelling of right lower extremity 06/11/2016  . Lesion of neck 03/23/2015  . Laceration of face 02/05/2015  . Chest pain 02/05/2015  . Hyperkalemia   . Cellulitis and abscess of foot 01/24/2015  . Cellulitis 01/12/2015  . Laceration of left leg 07/24/2014  . Laceration of left upper extremity 07/24/2014  . Weight loss 04/11/2014  . Urinary tract infectious disease 04/06/2014  . Rash, drug 07/18/2013  . Stasis ulcer ([place]) 07/07/2013  . Anemia 06/28/2012  . Hypertension 06/28/2012  . Hypercholesterolemia 06/28/2012  . Renal insufficiency 06/28/2012  . Osteoporosis 06/28/2012    Orientation RESPIRATION BLADDER Height & Weight     Self, Time, Situation, Place  Normal Continent Weight: 126 lb 9.6 oz (57.4 kg) Height:  5\' 5"  (165.1 cm)  BEHAVIORAL SYMPTOMS/MOOD NEUROLOGICAL BOWEL NUTRITION STATUS   (none)  (none) Continent Diet (Diet:  Dysphagia 3  (mechanical soft);Thin liquidLiquids provided via: Cup;Straw (monitor any straw use))  AMBULATORY STATUS COMMUNICATION OF NEEDS Skin   Limited Assist Verbally Other (Comment) (Non-pressure wound on left knee )                       Personal Care Assistance Level of Assistance  Bathing, Feeding, Dressing Bathing Assistance: Limited assistance Feeding assistance: Independent Dressing Assistance: Limited assistance     Functional Limitations Info  Sight, Hearing, Speech Sight Info: Adequate Hearing Info: Adequate Speech Info: Adequate    SPECIAL CARE FACTORS FREQUENCY  PT (By licensed PT), OT (By licensed OT)     PT Frequency:  (5) OT Frequency:  (5)            Contractures      Additional Factors Info  Code Status, Allergies Code Status Info:  (Full Code. ) Allergies Info:  (Cefuroxime Axetil, Doxycycline, Advil Ibuprofen, Celebrex, Celecoxib, Daypro Oxaprozin, Etodolac, Macrobid Nitrofurantoin Monohyd Macro, Penicillins, Percocet Oxycodone-acetaminophen, Tramadol, Valium Diazepam, Neosporin Neomycin-bacitracin Zn-polymyx)           Current Medications (02/09/2017):  This is the current hospital active medication list Current Facility-Administered Medications  Medication Dose Route Frequency Provider Last Rate Last Dose  . acetaminophen (TYLENOL) tablet 650 mg  650 mg Oral TID Vaughan Basta, MD   650 mg at 02/09/17 1634  . docusate sodium (COLACE) capsule 200 mg  200 mg Oral BID Dustin Flock, MD   200 mg at 02/09/17 0856  . guaiFENesin-dextromethorphan (ROBITUSSIN DM) 100-10 MG/5ML syrup 5 mL  5 mL Oral Q4H  PRN Lance Coon, MD   5 mL at 02/05/17 2203  . heparin injection 5,000 Units  5,000 Units Subcutaneous Q8H Janki, Dike, RPH   5,000 Units at 02/09/17 1542  . hydrOXYzine (ATARAX/VISTARIL) tablet 25 mg  25 mg Oral BID Dustin Flock, MD   25 mg at 02/09/17 0856  . loratadine (CLARITIN) tablet 10 mg  10 mg Oral Daily Dustin Flock, MD   10 mg  at 02/09/17 0855  . MEDLINE mouth rinse  15 mL Mouth Rinse BID Lance Coon, MD   15 mL at 02/08/17 2219  . ondansetron (ZOFRAN) tablet 4 mg  4 mg Oral Q6H PRN Lance Coon, MD       Or  . ondansetron [facility]) injection 4 mg  4 mg Intravenous Q6H PRN Lance Coon, MD   4 mg at 02/06/17 1217  . oxyCODONE (OXYCONTIN) 12 hr tablet 15 mg  15 mg Oral Q12H Vaughan Basta, MD   15 mg at 02/09/17 0855  . oxyCODONE-acetaminophen (PERCOCET/ROXICET) 5-325 MG per tablet 1 tablet  1 tablet Oral Q6H PRN Vaughan Basta, MD      . pantoprazole (PROTONIX) EC tablet 40 mg  40 mg Oral Daily Lance Coon, MD   40 mg at 02/09/17 0855  . phenol (CHLORASEPTIC) mouth spray 1 spray  1 spray Mouth/Throat PRN Dustin Flock, MD      . polyethylene glycol (MIRALAX / GLYCOLAX) packet 17 g  17 g Oral Daily Dustin Flock, MD   17 g at 02/09/17 0856  . polyethylene glycol (MIRALAX / GLYCOLAX) packet 17 g  17 g Oral Daily PRN Vaughan Basta, MD      . Derrill Memo ON 02/10/2017] ramipril (ALTACE) capsule 10 mg  10 mg Oral Daily Vaughan Basta, MD      . senna-docusate (Senokot-S) tablet 1 tablet  1 tablet Oral BID Vaughan Basta, MD   1 tablet at 02/09/17 0855  . sertraline (ZOLOFT) tablet 50 mg  50 mg Oral Daily Lance Coon, MD   50 mg at 02/09/17 0855  . traMADol (ULTRAM) tablet 50 mg  50 mg Oral Q6H PRN Dustin Flock, MD   50 mg at 02/09/17 1520     Discharge Medications: Please see discharge summary for a list of discharge medications.  Relevant Imaging Results:  Relevant Lab Results:   Additional Information  (SSN: [id number] Allergies continued: Vicodin Hydrocodone-acetaminophen)  Sample, Veronia Beets, LCSW

## 2017-02-09 NOTE — Progress Notes (Signed)
Pts BP 166/57. MD Molli Hazard notified. Orders received to give ramipril 5 mg X1 now and change daily dose to ramipril 10 mg instaed of 5 mg starting tomm morning.

## 2017-02-09 NOTE — Care Management (Addendum)
This RNCM received call from [facility] with Patient Experience that nephew to patient and health care power attorney has contacted them after attempting to reach out to floor CM/CSW. This RNCM reviewed patient's chart and did not see stated nephews name (Dr. Marcie Mowers [phone]). This RNCM contacted patient's emergency contact Carrie Rumley/sister and she states that he should be listed at her HCPOA and that this information should be on file. I have placed RNCM consult and notified RNCM covering floor of this request. Per sister patient "repeats herself but has not been determined to be demented however she is in a lot of pain and receiving pain medications". This RNCM requested that HCPOA form be brought to this admission; covering RNCM to meet with patient for permission to speak with Dr. Gerlean Ren. This RNCM spoke with patient--She states Dr. Gerlean Ren he is HCPOA and she appreciates RNCM speaking with him. She states she has front-wheeled walker which she typically uses at home. Oxygen is acute. She is not certain "who" will stay with her in the home. This RNCM left message for Dr. Gerlean Ren to call this RNCM to discuss discharge planning.  This RNCM received call back from Dr. Gerlean Ren. He states that he has been requesting to speak with CSW to discuss long-term placement and he understands that this would not be covered under Medicare- I agreed. He states he has been to [facility] however he hopes that CSW can provide more choices to LTC. CSW updated and that Dr. Gerlean Ren hopes to be able to talk to [place] today.

## 2017-02-09 NOTE — Clinical Social Work Placement (Signed)
   CLINICAL SOCIAL WORK PLACEMENT  NOTE  Date:  02/09/2017  Patient Details  Name: Kayla Galloway MRN: [id number] Date of Birth: [birth year]  Clinical Social Work is seeking post-discharge placement for this patient at the [place] level of care (*CSW will initial, date and re-position this form in  chart as items are completed):  Yes   Patient/family provided with [facility] Work Department's list of facilities offering this level of care within the geographic area requested by the patient (or if unable, by the patient's family).  Yes   Patient/family informed of their freedom to choose among providers that offer the needed level of care, that participate in Medicare, Medicaid or managed care program needed by the patient, have an available bed and are willing to accept the patient.  Yes   Patient/family informed of [facility]'s ownership interest in [facility] and [facility], as well as of the fact that they are under no obligation to receive care at these facilities.  PASRR submitted to EDS on       PASRR number received on       Existing PASRR number confirmed on 02/09/17     FL2 transmitted to all facilities in geographic area requested by pt/family on 02/09/17     FL2 transmitted to all facilities within larger geographic area on       Patient informed that his/her managed care company has contracts with or will negotiate with certain facilities, including the following:            Patient/family informed of bed offers received.  Patient chooses bed at       Physician recommends and patient chooses bed at      Patient to be transferred to   on  .  Patient to be transferred to facility by       Patient family notified on   of transfer.  Name of family member notified:        PHYSICIAN       Additional Comment:    _______________________________________________ Ailany Koren, Veronia Beets, LCSW 02/09/2017, 5:35 PM

## 2017-02-09 NOTE — Care Management (Signed)
Case discussed with attending. Patient remains weak. NA 126. Changed to inpatient. PT continues to recommend home health PT. Set up with [facility]. Following progression.

## 2017-02-09 NOTE — Progress Notes (Signed)
Physical Therapy Treatment Patient Details Name: Kayla Galloway MRN: [id number] DOB: July 01, 1921 Today's Date: 02/09/2017    History of Present Illness Pt is a 83 yo F admitted to acute care with L 6 and 8 rib fx after undergoing a fall. Prior to admission, pt at modI level, using RW for ambulation. PMH includes: anemia, arthritis, hx of DVT, hx of obesity, cancer, depression, GERD, spider veins, Gout, hypercholesterolemia, hyperkalemia, HTN, OP, PVD, renal vein thrombosis, and retroperitoneal bleed.     PT Comments    Pt is a pleasant 83 y.o. F, admitted to acute care for with L 6 and 8 rib fracture. Pt performs bed mobility with minA secondary to pain through L ribs. Tranfers and ambulation performed with supervision, due to pain, impaired LE strength, and impaired endurance. Pt amb 30 ft to toilet followed by a bout of 100 ft, 50 ft, and then 20 ft, requiring rest breaks due to impaired endurance and pain. However, she is able to amb household distances, demonstrated by amb max 100 ft before needing rest break. Pt on room air throughout entirety of treatment, with O2 sats dropping to 83% following amb and returning quickly to >90% with rest and pursed lipped breathing. Note: pulse oximeter was not reading well and inconsistent this am. Pt presented with initial slight dizziness with standing, which dissipated quickly; no other signs/symptoms noted with exertion. Pt presents with the following deficits: pain, strength, balance, and endurance, but is progressing well towards functional goals. Overall, pt responded well to today's treatment with no adverse affects. Pt would benefit from cont skilled PT to address the previously mentioned impairments and promote return to PLOF. Currently recommending HHPT, pending d/c.     Follow Up Recommendations  Home health PT;Supervision for mobility/OOB     Equipment Recommendations  3in1 (PT)    Recommendations for Other Services       Precautions /  Restrictions Precautions Precautions: Fall Precaution Comments: rib fx Restrictions Weight Bearing Restrictions: No    Mobility  Bed Mobility Overal bed mobility: Needs Assistance Bed Mobility: Supine to Sit     Supine to sit: Min assist     General bed mobility comments: Greatly improved since prios sessions. Pt able to initiate sitting with supervision and head of bed elevated. Once pt was half-way seated, SPT provided minA for pt to obtain erect trunk and scoot to the EOB, secondary to c/o pain in L ribs.   Transfers Overall transfer level: Needs assistance Equipment used: Rolling walker (2 wheeled) Transfers: Sit to/from Stand Sit to Stand: Supervision         General transfer comment: Supervision with STS; pt able to maintain correct body mechanics and safety, requiring min verbal cues for scooting to the edge of bed/chair. with lower surfaces, pt required minA to initiate standing, but cont to maintain correct mechanics and safety awareness.   Ambulation/Gait Ambulation/Gait assistance: Supervision Ambulation Distance (Feet): 200 Feet Assistive device: Rolling walker (2 wheeled) Gait Pattern/deviations: Shuffle;Decreased step length - right;Decreased step length - left;Step-through pattern Gait velocity: 1.11 Gait velocity interpretation: <1.8 ft/sec, indicative of risk for recurrent falls General Gait Details: Pt demonstrated reciprical gait pattern, but is shuffleing her feet more today due to pain and LE weakness. Supervision with amb as pt maintains correct body mechanics, good safety awareness, and good obstacle navigation skills. She was also able to demonstrate dual tasking of carrying on conversation with family member while amb, maintaing supervision level. Pt limited by pain and reduced levels  of endurance.      Stairs            Wheelchair Mobility    Modified Rankin (Stroke Patients Only)       Balance                                             Cognition Arousal/Alertness: Awake/alert Behavior During Therapy: WFL for tasks assessed/performed Overall Cognitive Status: Within Functional Limits for tasks assessed                                        Exercises Other Exercises Other Exercises: Supine therex with supervision to B LE's x12 reps: ankle pumps, quad sets  Other Exercises: Toileting: requires minA for hygiene purposes only, secondary to pain with trunk flexion. Supervision for transfers and management of gown.     General Comments        Pertinent Vitals/Pain Pain Assessment: 0-10 Pain Score: 7  Pain Location: L ribs  Pain Descriptors / Indicators: Discomfort;Aching;Constant (> with movement.) Pain Intervention(s): Limited activity within patient's tolerance;Monitored during session;Premedicated before session;Patient requesting pain meds-RN notified    Home Living                      Prior Function            PT Goals (current goals can now be found in the care plan section) Acute Rehab PT Goals Patient Stated Goal: To walk around.  PT Goal Formulation: With patient Time For Goal Achievement: 02/18/17 Potential to Achieve Goals: Good Progress towards PT goals: Progressing toward goals    Frequency    7X/week      PT Plan Current plan remains appropriate    Co-evaluation              AM-PAC PT "6 Clicks" Daily Activity  Outcome Measure  Difficulty turning over in bed (including adjusting bedclothes, sheets and blankets)?: Total Difficulty moving from lying on back to sitting on the side of the bed? : Total Difficulty sitting down on and standing up from a chair with arms (e.g., wheelchair, bedside commode, etc,.)?: Total Help needed moving to and from a bed to chair (including a wheelchair)?: A Little Help needed walking in hospital room?: A Little Help needed climbing 3-5 steps with a railing? : A Lot 6 Click Score: 11    End of  Session Equipment Utilized During Treatment: Gait belt Activity Tolerance: Patient limited by pain Patient left: in chair;with call bell/phone within reach;with chair alarm set;with family/visitor present Nurse Communication: Mobility status;Patient requests pain meds PT Visit Diagnosis: Unsteadiness on feet (R26.81);Other abnormalities of gait and mobility (R26.89);Muscle weakness (generalized) (M62.81);History of falling (Z91.81);Pain     Time: 0926-1008 PT Time Calculation (min) (ACUTE ONLY): 42 min  Charges:  $Gait Training: 8-22 mins $Therapeutic Exercise: 8-22 mins $Therapeutic Activity: 8-22 mins                    G Codes:  Functional Assessment Tool Used: AM-PAC 6 Clicks Basic Mobility    Oran Rein PT, SPT   Bevelyn Ngo 02/09/2017, 12:25 PM

## 2017-02-09 NOTE — Clinical Social Work Note (Signed)
Clinical Social Work Assessment  Patient Details  Name: Kayla Galloway MRN: [id number] Date of Birth: 01-21-1921  Date of referral:  02/09/17               Reason for consult:  Facility Placement                Permission sought to share information with:  Chartered certified accountant granted to share information::  Yes, Verbal Permission Granted  Name::      Kayla Galloway (Private Pay)   Agency::   [facility]   Relationship::     Contact Information:     Housing/Transportation Living arrangements for the past 2 months:  [place] of Information:  Power of Attorney Patient Interpreter Needed:  None Criminal Activity/Legal Involvement Pertinent to Current Situation/Hospitalization:  No - Comment as needed Significant Relationships:  Other Family Members Lives with:  Self Do you feel safe going back to the place where you live?  Yes Need for family participation in patient care:  Yes (Comment)  Care giving concerns:  Patient lives alone in [place].    Social Worker assessment / plan:  Holiday representative (CSW) received verbal consult from RN case manager that patient's nephew/HPOA Kayla Galloway [phone] wants to discuss private pay options for SNF. CSW met with patient's nephew Kayla Galloway outside of patient's room at his request. Per Kayla Galloway he is HPOA and brought the paper work that is now on the chart. Per Kayla Galloway patient lives alone in [place] and his mother does a lot for her at home. CSW explained that PT is recommending home health and her insurance will not pay for SNF. CSW discussed private pay options at SNF. Kayla Galloway is agreeable to private pay for short term rehab and requested a referral be sent to [facility] and [facility]. FL2 complete and faxed out. CSW will continue to follow and assist as needed.    Employment status:  Disabled (Comment on whether or not currently receiving Disability), Retired Office manager PT Recommendations:  Home with [place] / Referral to community resources:  [place]  Patient/Family's Response to care:  Patient's nephew is agreeable to private pay for SNF for patient.   Patient/Family's Understanding of and Emotional Response to Diagnosis, Current Treatment, and Prognosis:  Patient's nephew was very pleasant and thanked CSW for assistance.   Emotional Assessment Appearance:  Appears stated age Attitude/Demeanor/Rapport:  Unable to Assess Affect (typically observed):  Unable to Assess Orientation:  Oriented to Self, Oriented to Place, Oriented to  Time, Oriented to Situation Alcohol / Substance use:  Not Applicable Psych involvement (Current and /or in the community):  No (Comment)  Discharge Needs  Concerns to be addressed:  Discharge Planning Concerns Readmission within the last 30 days:  No Current discharge risk:  Chronically ill, Lives alone Barriers to Discharge:  Continued Medical Work up   [employer], Kayla Beets, LCSW 02/09/2017, 5:36 PM

## 2017-02-09 NOTE — Telephone Encounter (Signed)
Noted.  Thank you.  Just let me know if I need to do anything.

## 2017-02-09 NOTE — Telephone Encounter (Signed)
Nephew called back it was a mistake who he spoke to he states it was Kayla Galloway that he spoke to. He states they are possibly going to discharge his Aunt today and he would like to get paperwork going for [employer]. Thank you!

## 2017-02-09 NOTE — Telephone Encounter (Signed)
Spoke to patient family. They are having trouble getting socialworker at hospital to meet to set her up for rehab. I have given the number to [facility] to patient experience at [facility]. He will call us if any problems.

## 2017-02-09 NOTE — Telephone Encounter (Signed)
Noted. If he needs anything let me know, thanks

## 2017-02-10 DIAGNOSIS — W19XXXD Unspecified fall, subsequent encounter: Secondary | ICD-10-CM | POA: Diagnosis not present

## 2017-02-10 DIAGNOSIS — M6281 Muscle weakness (generalized): Secondary | ICD-10-CM | POA: Diagnosis not present

## 2017-02-10 DIAGNOSIS — S0181XD Laceration without foreign body of other part of head, subsequent encounter: Secondary | ICD-10-CM | POA: Diagnosis not present

## 2017-02-10 DIAGNOSIS — R1010 Upper abdominal pain, unspecified: Secondary | ICD-10-CM | POA: Diagnosis not present

## 2017-02-10 DIAGNOSIS — K219 Gastro-esophageal reflux disease without esophagitis: Secondary | ICD-10-CM | POA: Diagnosis not present

## 2017-02-10 DIAGNOSIS — S2239XA Fracture of one rib, unspecified side, initial encounter for closed fracture: Secondary | ICD-10-CM | POA: Diagnosis not present

## 2017-02-10 DIAGNOSIS — Z7401 Bed confinement status: Secondary | ICD-10-CM | POA: Diagnosis not present

## 2017-02-10 DIAGNOSIS — M8000XD Age-related osteoporosis with current pathological fracture, unspecified site, subsequent encounter for fracture with routine healing: Secondary | ICD-10-CM | POA: Diagnosis not present

## 2017-02-10 DIAGNOSIS — E441 Mild protein-calorie malnutrition: Secondary | ICD-10-CM | POA: Diagnosis not present

## 2017-02-10 DIAGNOSIS — S2242XD Multiple fractures of ribs, left side, subsequent encounter for fracture with routine healing: Secondary | ICD-10-CM | POA: Diagnosis not present

## 2017-02-10 DIAGNOSIS — I10 Essential (primary) hypertension: Secondary | ICD-10-CM | POA: Diagnosis not present

## 2017-02-10 DIAGNOSIS — E871 Hypo-osmolality and hyponatremia: Secondary | ICD-10-CM | POA: Diagnosis not present

## 2017-02-10 LAB — BASIC METABOLIC PANEL
Anion gap: 6 (ref 5–15)
BUN: 31 mg/dL — AB (ref 6–20)
CALCIUM: 8.6 mg/dL — AB (ref 8.9–10.3)
CO2: 26 mmol/L (ref 22–32)
Chloride: 98 mmol/L — ABNORMAL LOW (ref 101–111)
Creatinine, Ser: 0.95 mg/dL (ref 0.44–1.00)
GFR calc Af Amer: 58 mL/min — ABNORMAL LOW (ref >60)
GFR, EST NON AFRICAN AMERICAN: 50 mL/min — AB (ref >60)
GLUCOSE: 91 mg/dL (ref 65–99)
POTASSIUM: 4.6 mmol/L (ref 3.5–5.1)
SODIUM: 130 mmol/L — AB (ref 135–145)

## 2017-02-10 MED ORDER — GUAIFENESIN-DM 100-10 MG/5ML PO SYRP: 5.0000 mL | ORAL_SOLUTION | ORAL | 0 refills | Status: DC | PRN

## 2017-02-10 MED ORDER — HYDROCODONE-ACETAMINOPHEN 5-325 MG PO TABS: 1.0000 | ORAL_TABLET | Freq: Four times a day (QID) | ORAL | 0 refills | Status: DC | PRN

## 2017-02-10 MED ORDER — OXYCODONE HCL ER 15 MG PO T12A: 15.0000 mg | EXTENDED_RELEASE_TABLET | Freq: Two times a day (BID) | ORAL | 0 refills | Status: DC

## 2017-02-10 MED ORDER — TRAMADOL HCL 50 MG PO TABS: 50.0000 mg | ORAL_TABLET | Freq: Four times a day (QID) | ORAL | 0 refills | Status: DC | PRN

## 2017-02-10 MED ORDER — POLYETHYLENE GLYCOL 3350 17 G PO PACK
17.0000 g | PACK | Freq: Every day | ORAL | Status: DC
  Administered 2017-02-10: 17 g via ORAL
  Filled 2017-02-10: qty 1

## 2017-02-10 MED ORDER — FUROSEMIDE 20 MG PO TABS: 20.0000 mg | ORAL_TABLET | ORAL | 0 refills | Status: DC

## 2017-02-10 MED ORDER — AMLODIPINE BESYLATE 5 MG PO TABS
5.0000 mg | ORAL_TABLET | Freq: Every day | ORAL | Status: DC
  Administered 2017-02-10: 5 mg via ORAL
  Filled 2017-02-10: qty 1

## 2017-02-10 MED ORDER — POLYETHYLENE GLYCOL 3350 17 G PO PACK: 17.0000 g | PACK | Freq: Every day | ORAL | 0 refills | Status: DC | PRN

## 2017-02-10 MED ORDER — RAMIPRIL 10 MG PO CAPS: 10.0000 mg | ORAL_CAPSULE | Freq: Every day | ORAL | 0 refills | Status: DC

## 2017-02-10 MED ORDER — DOCUSATE SODIUM 100 MG PO CAPS: 200.0000 mg | ORAL_CAPSULE | Freq: Two times a day (BID) | ORAL | 0 refills | Status: DC

## 2017-02-10 NOTE — Progress Notes (Signed)
Clinical Education officer, museum (CSW) presented bed offers to patient's nephew Marcie Mowers and he chose [facility]. Per [facility] admissions coordinator at [facility] patient can come today under [facility] and doesn't have to pay up front. Magda Paganini reported that she would speak to Elta Guadeloupe about paying privately if [facility] cuts patient off. CSW also made Elta Guadeloupe aware of the risk of paying privately. Elta Guadeloupe verbalized his understanding.   Patient is medically stable for D/C to [place] today room 401. RN will call report to 400 hall RN and arrange EMS for transport. CSW sent D/C orders to [facility] via [facility]. Patient is aware of above. Patient's nephew Elta Guadeloupe is aware of above. Please reconsult if future social work needs arise. CSW signing off.   [employer], LCSW [phone]

## 2017-02-10 NOTE — Progress Notes (Signed)
PT Cancellation Note  Patient Details Name: Kayla Galloway MRN: [id number] DOB: 06/04/1921   Cancelled Treatment:    Reason Eval/Treat Not Completed: Other (comment) Treatment attempted. Currently unable to treat, as pt has been eating breakfast >1 hr. SPT attempted multiple times and pt refused. Will re-attempt, time permitting.    Bevelyn Ngo 02/10/2017, 9:57 AM

## 2017-02-10 NOTE — Progress Notes (Signed)
Pts BP 175/65, MD Vachanni notified. Per MD give scheduled ramipril 10 mg now.

## 2017-02-10 NOTE — Care Management Important Message (Signed)
Important Message  Patient Details  Name: Kayla Galloway MRN: [id number] Date of Birth: 12-Mar-1921   Medicare Important Message Given:  Yes    Jolly Mango, RN 02/10/2017, 10:47 AM

## 2017-02-10 NOTE — Care Management (Addendum)
Chart reviewed. Patient is to go to SNF private pay. [facility] notified.

## 2017-02-10 NOTE — Progress Notes (Signed)
Report called Claiborne Billings at [facility]. EMS called for transportation.

## 2017-02-10 NOTE — Clinical Social Work Placement (Signed)
   CLINICAL SOCIAL WORK PLACEMENT  NOTE  Date:  02/10/2017  Patient Details  Name: Kayla Galloway MRN: [id number] Date of Birth: 09-Nov-1920  Clinical Social Work is seeking post-discharge placement for this patient at the [place] level of care (*CSW will initial, date and re-position this form in  chart as items are completed):  Yes   Patient/family provided with [facility] Work Department's list of facilities offering this level of care within the geographic area requested by the patient (or if unable, by the patient's family).  Yes   Patient/family informed of their freedom to choose among providers that offer the needed level of care, that participate in Medicare, Medicaid or managed care program needed by the patient, have an available bed and are willing to accept the patient.  Yes   Patient/family informed of [facility]'s ownership interest in [facility] and [facility], as well as of the fact that they are under no obligation to receive care at these facilities.  PASRR submitted to EDS on       PASRR number received on       Existing PASRR number confirmed on 02/09/17     FL2 transmitted to all facilities in geographic area requested by pt/family on 02/09/17     FL2 transmitted to all facilities within larger geographic area on       Patient informed that his/her managed care company has contracts with or will negotiate with certain facilities, including the following:        Yes   Patient/family informed of bed offers received.  Patient chooses bed at  [facility])     Physician recommends and patient chooses bed at      Patient to be transferred to  [facility] ) on 02/10/17.  Patient to be transferred to facility by  [facility] )     Patient family notified on 02/10/17 of transfer.  Name of family member notified:   (Patient's nephew Marcie Mowers is aware of D/C today. )     PHYSICIAN        Additional Comment:    _______________________________________________ Sharie Amorin, Veronia Beets, LCSW 02/10/2017, 12:49 PM

## 2017-02-10 NOTE — Progress Notes (Signed)
EMS here to transport pt. 

## 2017-02-10 NOTE — Discharge Summary (Signed)
Kayla Galloway at [facility], 83 y.o., DOB 05-16-1921, MRN [id number]. Admission date: 02/03/2017 Discharge Date 02/10/2017 Primary MD [name], MD Admitting Physician Lance Coon, MD  Admission Diagnosis  Facial laceration, initial encounter [S01.81XA] Fall, initial encounter [W19.XXXA] Closed fracture of multiple ribs of left side, initial encounter [S22.42XA]  Discharge Diagnosis   Principal Problem:   Rib fractures left sided 6-8 minimally displaced rib fracture   Hypertension   GERD (gastroesophageal reflux disease)   depression   gerd   hyponatremia   Kayla Galloway  is a 83 y.o. female who presents with Fall and subsequent rib fractures. Patient had a mechanical fall she was trying to step over a garden hose. She has 6th and 8th minimally displaced rib fractures. Her pain is significantly difficult to control in the ED. Hospitalists were called for admission. Patient was admitted for pain control with supportive care. She is doing much better her pain is now improved. She was seen PT who recommended home health. But due to generalized weakness family had concerns and suggested to send her to Rehab.  She had hypoxia with walking initially during hospital stay- due to atelactesis- which improved with better pain control and incentive spirometry use.  Had Hyponatremia due to use of lasix- which We stopepd and given some Iv fluids, finally improved.   Advised to take lasix only 3 times a week rather than taking daily now/  Suggest - NO anticoagulants due to her increased risk of falls.  Hypertension- increased ramipril dose and stable.   Consults  none  Significant Tests:  See full reports for all details     Dg Ribs Unilateral W/chest Left  Result Date: 02/03/2017 CLINICAL DATA:  83 y/o F; status post fall with left-sided rib pain. EXAM: LEFT RIBS AND CHEST - 3+ VIEW COMPARISON:  None. FINDINGS: Mildly displaced  fractures of the left sixth and eighth ribs. No additional appreciable rib fracture identified. IMPRESSION: Mildly displaced fractures of the left sixth and eighth ribs. No additional appreciable rib fracture identified. Electronically Signed   By: Kristine Garbe M.D.   On: 02/03/2017 20:56   Dg Scapula Left  Result Date: 02/03/2017 CLINICAL DATA:  Tripped over garden hose and fell on left side. Left shoulder pain. Concern for scapular injury. Initial encounter. EXAM: LEFT SCAPULA - 2+ VIEWS COMPARISON:  Left shoulder radiographs performed earlier today at 8:16 p.m. FINDINGS: The left scapula appears grossly intact. The left humeral head remains seated at the glenoid fossa. Mild degenerative change is noted at the left acromioclavicular joint. The previously noted mildly displaced left lateral eighth rib fracture is not imaged on this study. IMPRESSION: No evidence of fracture or dislocation. Left scapula appears intact. Electronically Signed   By: Garald Balding M.D.   On: 02/03/2017 22:13   Dg Ankle Complete Left  Result Date: 02/03/2017 CLINICAL DATA:  Tripped over garden hose and fell on left side. Left ankle pain. Initial encounter. EXAM: LEFT ANKLE COMPLETE - 3+ VIEW COMPARISON:  Left tibia/fibula radiographs performed 01/12/2015 FINDINGS: There is no evidence of fracture or dislocation. The ankle mortise is intact; the interosseous space is within normal limits. No talar tilt or subluxation is seen. The joint spaces are preserved. Mild medial soft tissue swelling is noted. Diffuse vascular calcifications are seen. IMPRESSION: 1. No evidence of fracture or dislocation. 2. Diffuse vascular calcifications seen. Electronically Signed   By: Garald Balding M.D.   On: 02/03/2017 22:18  Ct Head Wo Contrast  Result Date: 02/03/2017 CLINICAL DATA:  Tripped over a garden hose and fell onto LEFT side, complaining of LEFT-sided shoulder pain, bruising to LEFT side of face, history hypertension EXAM:  CT HEAD WITHOUT CONTRAST CT CERVICAL SPINE WITHOUT CONTRAST TECHNIQUE: Multidetector CT imaging of the head and cervical spine was performed following the standard protocol without intravenous contrast. Multiplanar CT image reconstructions of the cervical spine were also generated. COMPARISON:  CT head 09/02/2015 FINDINGS: CT HEAD FINDINGS Brain: Generalized atrophy. Normal ventricular morphology. No midline shift or mass effect. Small vessel chronic ischemic changes of deep cerebral white matter. No intracranial hemorrhage, mass lesion, evidence of acute infarction, or extra-axial fluid collection. Vascular: Atherosclerotic calcifications of the internal carotid and vertebral arteries at skullbase. Skull: Intact Sinuses/Orbits: Chronic mucosal thickening and partial opacity of the LEFT maxillary sinus with osseous wall thickening. Remaining paranasal sinuses and mastoid air cells clear Other: N/A CT CERVICAL SPINE FINDINGS Alignment: 3.3 mm of anterolisthesis at C3-C4. Less than 2 mm of retrolisthesis at C4-C5, C5-C6, and C6-C7. Skull base and vertebrae: Visualized skullbase intact. Vertebral body heights maintained. No fracture or bone destruction. Mild scattered facet degenerative changes cervical spine. Soft tissues and spinal canal: Prevertebral soft tissues normal thickness. Atherosclerotic calcifications at the carotid bifurcations bilaterally and at the great vessels in the lower cervical region. Disc levels: Multilevel disc space narrowing and scattered endplate spurs. Scattered bony neural foraminal narrowing. Upper chest: Biapical lung scarring. Other: Degenerative changes BILATERAL temporomandibular joints. IMPRESSION: Atrophy with small-vessel chronic ischemic changes of deep cerebral white matter. No acute intracranial abnormalities. Chronic LEFT maxillary sinus disease. Osseous demineralization with degenerative disc and facet disease changes of the cervical spine associated with 3.3 mm of  anterolisthesis at C3-C4 and mild degrees of retrolisthesis at C4-C5 through C6-C7. No acute fractures identified. Electronically Signed   By: Lavonia Dana M.D.   On: 02/03/2017 20:19   Ct Cervical Spine Wo Contrast  Result Date: 02/03/2017 CLINICAL DATA:  Tripped over a garden hose and fell onto LEFT side, complaining of LEFT-sided shoulder pain, bruising to LEFT side of face, history hypertension EXAM: CT HEAD WITHOUT CONTRAST CT CERVICAL SPINE WITHOUT CONTRAST TECHNIQUE: Multidetector CT imaging of the head and cervical spine was performed following the standard protocol without intravenous contrast. Multiplanar CT image reconstructions of the cervical spine were also generated. COMPARISON:  CT head 09/02/2015 FINDINGS: CT HEAD FINDINGS Brain: Generalized atrophy. Normal ventricular morphology. No midline shift or mass effect. Small vessel chronic ischemic changes of deep cerebral white matter. No intracranial hemorrhage, mass lesion, evidence of acute infarction, or extra-axial fluid collection. Vascular: Atherosclerotic calcifications of the internal carotid and vertebral arteries at skullbase. Skull: Intact Sinuses/Orbits: Chronic mucosal thickening and partial opacity of the LEFT maxillary sinus with osseous wall thickening. Remaining paranasal sinuses and mastoid air cells clear Other: N/A CT CERVICAL SPINE FINDINGS Alignment: 3.3 mm of anterolisthesis at C3-C4. Less than 2 mm of retrolisthesis at C4-C5, C5-C6, and C6-C7. Skull base and vertebrae: Visualized skullbase intact. Vertebral body heights maintained. No fracture or bone destruction. Mild scattered facet degenerative changes cervical spine. Soft tissues and spinal canal: Prevertebral soft tissues normal thickness. Atherosclerotic calcifications at the carotid bifurcations bilaterally and at the great vessels in the lower cervical region. Disc levels: Multilevel disc space narrowing and scattered endplate spurs. Scattered bony neural foraminal  narrowing. Upper chest: Biapical lung scarring. Other: Degenerative changes BILATERAL temporomandibular joints. IMPRESSION: Atrophy with small-vessel chronic ischemic changes of deep cerebral white matter. No  acute intracranial abnormalities. Chronic LEFT maxillary sinus disease. Osseous demineralization with degenerative disc and facet disease changes of the cervical spine associated with 3.3 mm of anterolisthesis at C3-C4 and mild degrees of retrolisthesis at C4-C5 through C6-C7. No acute fractures identified. Electronically Signed   By: Lavonia Dana M.D.   On: 02/03/2017 20:19   Dg Chest Port 1 View  Result Date: 02/06/2017 CLINICAL DATA:  Shortness of breath.  DVT.  Fall.  Rib fractures. EXAM: PORTABLE CHEST 1 VIEW COMPARISON:  Rib series 619 2018.  Chest x-ray 01/29/2015. FINDINGS: Mediastinum hilar structures normal. Heart size normal. No pulmonary venous congestion. Left lower lobe atelectasis and consolidation with left-sided pleural effusion. No pneumothorax . Biapical pleural thickening consistent scarring. Prior lower thoracic vertebroplasty. Left lower rib fractures best identified by prior rib series. IMPRESSION: 1. Left lower lobe left lower lobe atelectasis and consolidation and associated small left pleural effusion. 2. Left lower rib fractures best identified by prior rib series. No pneumothorax . Electronically Signed   By: Marcello Moores  Register   On: 02/06/2017 07:35   Dg Shoulder Left  Result Date: 02/03/2017 CLINICAL DATA:  83 year old female with left shoulder pain. EXAM: LEFT SHOULDER - 2+ VIEW COMPARISON:  Left rib radiograph dated 02/03/2017 and the left ovary radiograph dated 02/03/2017 FINDINGS: There is no acute fracture of the left shoulder. No dislocation. There are chronic degenerative changes of the left humeral head. The bones are osteopenic. Partially visualized minimally displaced left lateral rib fracture. This is better evaluated on the rib series radiograph. Multilevel  thoracic compression deformities and vertebroplasty changes. Stable cardiac silhouette. IMPRESSION: 1. No acute fracture or dislocation of the left shoulder. 2. Minimally displaced fracture of the lateral aspect of a left rib. Electronically Signed   By: Anner Crete M.D.   On: 02/03/2017 20:58       Today   Subjective:   Kayla Galloway  feels better pain under control  Objective:   Blood pressure (!) 164/61, pulse 74, temperature 98.2 F (36.8 C), temperature source Oral, resp. rate 18, height 5\' 5"  (1.651 m), weight 57.4 kg (126 lb 9.6 oz), SpO2 95 %.  .  Intake/Output Summary (Last 24 hours) at 02/10/17 1024 Last data filed at 02/09/17 1800  Gross per 24 hour  Intake              120 ml  Output                0 ml  Net              120 ml    Exam VITAL SIGNS: Blood pressure (!) 164/61, pulse 74, temperature 98.2 F (36.8 C), temperature source Oral, resp. rate 18, height 5\' 5"  (1.651 m), weight 57.4 kg (126 lb 9.6 oz), SpO2 95 %.  GENERAL:  83 y.o.-year-old patient lying in the bed with no acute distress.  EYES: Pupils equal, round, reactive to light and accommodation. No scleral icterus. Extraocular muscles intact.  HEENT: Head atraumatic, normocephalic. Oropharynx and nasopharynx clear.  NECK:  Supple, no jugular venous distention. No thyroid enlargement, no tenderness.  LUNGS: Normal breath sounds bilaterally, no wheezing, rales,rhonchi or crepitation. No use of accessory muscles of respiration.  CARDIOVASCULAR: S1, S2 normal. No murmurs, rubs, or gallops.  ABDOMEN: Soft, nontender, nondistended. Bowel sounds present. No organomegaly or mass.  EXTREMITIES: No pedal edema, cyanosis, or clubbing.  NEUROLOGIC: Cranial nerves II through XII are intact. Muscle strength 4/5 in all extremities. Sensation intact. Gait not  checked.  PSYCHIATRIC: The patient is alert and oriented x 3.  SKIN: No obvious rash, lesion, or ulcer.   Data Review     CBC w Diff:  Lab Results   Component Value Date   WBC 8.7 02/08/2017   HGB 11.0 (L) 02/08/2017   HGB 9.8 (L) 08/06/2012   HCT 31.8 (L) 02/08/2017   HCT 28.2 (L) 08/06/2012   PLT 166 02/08/2017   PLT 134 (L) 08/06/2012   LYMPHOPCT 13 02/03/2017   LYMPHOPCT 22.5 08/06/2012   MONOPCT 6 02/03/2017   MONOPCT 8.1 08/06/2012   EOSPCT 0 02/03/2017   EOSPCT 2.1 08/06/2012   BASOPCT 0 02/03/2017   BASOPCT 0.3 08/06/2012   CMP:  Lab Results  Component Value Date   NA 130 (L) 02/10/2017   NA 131 (L) 08/06/2012   K 4.6 02/10/2017   K 4.3 08/04/2012   CL 98 (L) 02/10/2017   CL 96 (L) 08/04/2012   CO2 26 02/10/2017   CO2 25 08/04/2012   BUN 31 (H) 02/10/2017   BUN 16 08/04/2012   CREATININE 0.95 02/10/2017   CREATININE 1.28 08/04/2012   PROT 7.3 11/04/2016   PROT 6.8 08/04/2012   ALBUMIN 4.3 11/04/2016   ALBUMIN 3.6 08/04/2012   BILITOT 0.4 11/04/2016   BILITOT 0.5 08/04/2012   ALKPHOS 61 11/04/2016   ALKPHOS 67 08/04/2012   AST 23 11/04/2016   AST 29 08/04/2012   ALT 12 11/04/2016   ALT 22 08/04/2012  .  Micro Results No results found for this or any previous visit (from the past 240 hour(s)).      Code Status Orders        Start     Ordered   02/04/17 0238  Full code  Continuous     02/04/17 0237    Code Status History    Date Active Date Inactive Code Status Order ID Comments User Context   09/06/2015 12:46 PM 09/06/2015  7:07 PM Full Code [id number]  Hessie Knows, MD Inpatient   01/24/2015  2:30 PM 01/26/2015  7:07 PM Full Code [id number]  Max Sane, MD Inpatient   01/12/2015 10:48 PM 01/15/2015  2:28 PM Full Code [id number]  Aldean Jewett, MD Inpatient    Advance Directive Documentation     Most Recent Value  Type of Advance Directive  Healthcare Power of Attorney  Pre-existing out of facility DNR order (yellow form or pink MOST form)  -  "MOST" Form in Place?  -          Follow-up Information    [name], MD Follow up on 02/13/2017.   Specialty:  Internal  Medicine Why:  [id number] office will call Patient with appt Contact information: [address] [place] [address] [phone]           Discharge Medications   Allergies as of 02/10/2017      Reactions   Cefuroxime Axetil Rash   Pt states that she does fine with Keflex.     Doxycycline Nausea Only, Other (See Comments)   Reaction:  Weight loss    Advil [ibuprofen] Swelling   Celebrex [celecoxib] Nausea Only   Daypro [oxaprozin] Other (See Comments)   Reaction:  Dizziness    Etodolac Nausea Only   Macrobid [nitrofurantoin Monohyd Macro] Other (See Comments)   Reaction:  Burning    Penicillins Other (See Comments)   Reaction:  Burning    Percocet [oxycodone-acetaminophen] Nausea Only, Swelling   Tramadol Other (See Comments)  Reaction:  Unknown    Valium [diazepam] Other (See Comments)   Reaction:  Dizziness    Neosporin [neomycin-bacitracin Zn-polymyx] Rash   Vicodin [hydrocodone-acetaminophen] Nausea Only      Medication List    TAKE these medications   acetaminophen 650 MG CR tablet Commonly known as:  TYLENOL Take 650 mg by mouth every 8 (eight) hours as needed for pain.   bifidobacterium infantis capsule Take 1 capsule by mouth daily.   Calcium 600-200 MG-UNIT tablet Take 1 tablet by mouth 3 (three) times daily with meals.   docusate sodium 100 MG capsule Commonly known as:  COLACE Take 2 capsules (200 mg total) by mouth 2 (two) times daily.   furosemide 20 MG tablet Commonly known as:  LASIX Take 1 tablet (20 mg total) by mouth 3 (three) times a week. Start taking on:  02/11/2017 What changed:  when to take this   Glucosamine-Chondroitin 250-200 MG Tabs Take 1 tablet by mouth 2 (two) times daily.   guaiFENesin-dextromethorphan 100-10 MG/5ML syrup Commonly known as:  ROBITUSSIN DM Take 5 mLs by mouth every 4 (four) hours as needed for cough.   HYDROcodone-acetaminophen 5-325 MG tablet Commonly known as:   NORCO/VICODIN Take 1 tablet by mouth every 6 (six) hours as needed for severe pain.   hydrOXYzine 10 MG tablet Commonly known as:  ATARAX/VISTARIL Take 25 mg by mouth 3 (three) times daily as needed for itching.   omeprazole 20 MG capsule Commonly known as:  PRILOSEC Take 1 capsule by mouth  daily What changed:  how much to take  how to take this  when to take this  additional instructions   oxyCODONE 15 mg 12 hr tablet Commonly known as:  OXYCONTIN Take 1 tablet (15 mg total) by mouth every 12 (twelve) hours.   polyethylene glycol packet Commonly known as:  MIRALAX / GLYCOLAX Take 17 g by mouth daily as needed for mild constipation.   ramipril 10 MG capsule Commonly known as:  ALTACE Take 1 capsule (10 mg total) by mouth daily. Start taking on:  02/11/2017 What changed:  medication strength  how much to take  how to take this  when to take this  additional instructions   sertraline 50 MG tablet Commonly known as:  ZOLOFT Take 1 tablet by mouth  daily What changed:  how much to take  how to take this  when to take this  additional instructions   traMADol 50 MG tablet Commonly known as:  ULTRAM Take 1 tablet (50 mg total) by mouth every 6 (six) hours as needed for moderate pain.       Total Time in preparing paper work, data evaluation and todays exam - 35 minutes  Vaughan Basta M.D on 02/10/2017 at 10:24 AM  [facility]   Office  [phone]

## 2017-02-11 DIAGNOSIS — E441 Mild protein-calorie malnutrition: Secondary | ICD-10-CM | POA: Diagnosis not present

## 2017-02-11 DIAGNOSIS — E871 Hypo-osmolality and hyponatremia: Secondary | ICD-10-CM | POA: Diagnosis not present

## 2017-02-11 DIAGNOSIS — M8000XD Age-related osteoporosis with current pathological fracture, unspecified site, subsequent encounter for fracture with routine healing: Secondary | ICD-10-CM | POA: Diagnosis not present

## 2017-02-11 NOTE — Telephone Encounter (Signed)
FYI , patient discharged to [facility] for skilled nursing will continue to follow.

## 2017-02-11 NOTE — Telephone Encounter (Signed)
Patient discharged yesterday late afternoon, will have LPN completed TCM today, thanks

## 2017-02-11 NOTE — Telephone Encounter (Signed)
Patient was discharged to Skill nursing at [facility] will follow for discharge home for TCM.

## 2017-02-12 ENCOUNTER — Other Ambulatory Visit: Payer: Self-pay | Admitting: *Deleted

## 2017-02-12 NOTE — Patient Outreach (Signed)
[place] [facility]) Care Management  02/12/2017  Kayla Galloway 04-03-1921 [id number]   Met with patient and her sister. Patient reports she lives alone, her sister and nephew assist her but she is mostly independent up until recent fall with injury. Patient does not have any children and her nephew is her POA.  RNCM discussed [facility] care management and left brochure for patient and family.   Met with [name], SW at facility. She reports that patient nephew has POA and he wants patient to be placed in LTC. He is looking into financials and Medicaid.   Plan to follow up on discharge plans with patient or SW to assess for [facility] care management needs. Royetta Crochet. Laymond Purser, RN, BSN, [place] [phone]) Business Cell  [phone]) Toll Free Office

## 2017-02-19 ENCOUNTER — Other Ambulatory Visit: Payer: Self-pay | Admitting: *Deleted

## 2017-02-19 NOTE — Patient Outreach (Signed)
[place] [facility]) Care Management  02/19/2017  Kayla Galloway 1920/09/13 [id number]   Spoke with [name], SW at facility. She reports that nephew plans to keep patient at facility LTC, patient continues to be a 2 person assist and had been living home alone and independent before fall.   Plan to sign off at this time as patient will remain LTC and no [facility] community care management needs assessed.  Royetta Crochet. Laymond Purser, RN, BSN, [place] [phone]) Business Cell  [phone]) Toll Free Office

## 2017-03-05 DIAGNOSIS — Z79899 Other long term (current) drug therapy: Secondary | ICD-10-CM | POA: Diagnosis not present

## 2017-03-05 DIAGNOSIS — N390 Urinary tract infection, site not specified: Secondary | ICD-10-CM | POA: Diagnosis not present

## 2017-03-05 DIAGNOSIS — R319 Hematuria, unspecified: Secondary | ICD-10-CM | POA: Diagnosis not present

## 2017-03-05 DIAGNOSIS — S2242XD Multiple fractures of ribs, left side, subsequent encounter for fracture with routine healing: Secondary | ICD-10-CM | POA: Diagnosis not present

## 2017-03-12 DIAGNOSIS — I10 Essential (primary) hypertension: Secondary | ICD-10-CM | POA: Diagnosis not present

## 2017-03-12 DIAGNOSIS — S2242XD Multiple fractures of ribs, left side, subsequent encounter for fracture with routine healing: Secondary | ICD-10-CM | POA: Diagnosis not present

## 2017-03-12 DIAGNOSIS — M1990 Unspecified osteoarthritis, unspecified site: Secondary | ICD-10-CM | POA: Diagnosis not present

## 2017-03-12 DIAGNOSIS — D649 Anemia, unspecified: Secondary | ICD-10-CM | POA: Diagnosis not present

## 2017-03-12 DIAGNOSIS — I739 Peripheral vascular disease, unspecified: Secondary | ICD-10-CM | POA: Diagnosis not present

## 2017-03-12 DIAGNOSIS — Z9181 History of falling: Secondary | ICD-10-CM | POA: Diagnosis not present

## 2017-03-12 DIAGNOSIS — N390 Urinary tract infection, site not specified: Secondary | ICD-10-CM | POA: Diagnosis not present

## 2017-03-12 DIAGNOSIS — Z87891 Personal history of nicotine dependence: Secondary | ICD-10-CM | POA: Diagnosis not present

## 2017-03-12 DIAGNOSIS — M810 Age-related osteoporosis without current pathological fracture: Secondary | ICD-10-CM | POA: Diagnosis not present

## 2017-03-12 DIAGNOSIS — K219 Gastro-esophageal reflux disease without esophagitis: Secondary | ICD-10-CM | POA: Diagnosis not present

## 2017-03-12 DIAGNOSIS — Z792 Long term (current) use of antibiotics: Secondary | ICD-10-CM | POA: Diagnosis not present

## 2017-03-17 ENCOUNTER — Telehealth: Payer: Self-pay | Admitting: Internal Medicine

## 2017-03-17 NOTE — Telephone Encounter (Signed)
Ok

## 2017-03-17 NOTE — Telephone Encounter (Signed)
L/m to call office on v/m

## 2017-03-17 NOTE — Telephone Encounter (Signed)
Left message to return call to our office.  

## 2017-03-17 NOTE — Telephone Encounter (Signed)
Kayla Galloway from [facility] home health called and is looking for verbal order for nursing for 1x4 and ot eval. Please advise, thank you!  Call Kayla Galloway @ [phone]

## 2017-03-17 NOTE — Telephone Encounter (Signed)
Ok to give verbal 

## 2017-03-18 DIAGNOSIS — I739 Peripheral vascular disease, unspecified: Secondary | ICD-10-CM | POA: Diagnosis not present

## 2017-03-18 DIAGNOSIS — Z87891 Personal history of nicotine dependence: Secondary | ICD-10-CM | POA: Diagnosis not present

## 2017-03-18 DIAGNOSIS — D649 Anemia, unspecified: Secondary | ICD-10-CM | POA: Diagnosis not present

## 2017-03-18 DIAGNOSIS — Z9181 History of falling: Secondary | ICD-10-CM | POA: Diagnosis not present

## 2017-03-18 DIAGNOSIS — K219 Gastro-esophageal reflux disease without esophagitis: Secondary | ICD-10-CM | POA: Diagnosis not present

## 2017-03-18 DIAGNOSIS — Z792 Long term (current) use of antibiotics: Secondary | ICD-10-CM | POA: Diagnosis not present

## 2017-03-18 DIAGNOSIS — M810 Age-related osteoporosis without current pathological fracture: Secondary | ICD-10-CM | POA: Diagnosis not present

## 2017-03-18 DIAGNOSIS — S2242XD Multiple fractures of ribs, left side, subsequent encounter for fracture with routine healing: Secondary | ICD-10-CM | POA: Diagnosis not present

## 2017-03-18 DIAGNOSIS — I10 Essential (primary) hypertension: Secondary | ICD-10-CM | POA: Diagnosis not present

## 2017-03-18 DIAGNOSIS — N390 Urinary tract infection, site not specified: Secondary | ICD-10-CM | POA: Diagnosis not present

## 2017-03-18 DIAGNOSIS — M1990 Unspecified osteoarthritis, unspecified site: Secondary | ICD-10-CM | POA: Diagnosis not present

## 2017-03-20 DIAGNOSIS — Z792 Long term (current) use of antibiotics: Secondary | ICD-10-CM | POA: Diagnosis not present

## 2017-03-20 DIAGNOSIS — D649 Anemia, unspecified: Secondary | ICD-10-CM | POA: Diagnosis not present

## 2017-03-20 DIAGNOSIS — I10 Essential (primary) hypertension: Secondary | ICD-10-CM | POA: Diagnosis not present

## 2017-03-20 DIAGNOSIS — S2242XD Multiple fractures of ribs, left side, subsequent encounter for fracture with routine healing: Secondary | ICD-10-CM | POA: Diagnosis not present

## 2017-03-20 DIAGNOSIS — N390 Urinary tract infection, site not specified: Secondary | ICD-10-CM | POA: Diagnosis not present

## 2017-03-20 DIAGNOSIS — K219 Gastro-esophageal reflux disease without esophagitis: Secondary | ICD-10-CM | POA: Diagnosis not present

## 2017-03-20 DIAGNOSIS — M1990 Unspecified osteoarthritis, unspecified site: Secondary | ICD-10-CM | POA: Diagnosis not present

## 2017-03-20 DIAGNOSIS — Z87891 Personal history of nicotine dependence: Secondary | ICD-10-CM | POA: Diagnosis not present

## 2017-03-20 DIAGNOSIS — Z9181 History of falling: Secondary | ICD-10-CM | POA: Diagnosis not present

## 2017-03-20 DIAGNOSIS — M810 Age-related osteoporosis without current pathological fracture: Secondary | ICD-10-CM | POA: Diagnosis not present

## 2017-03-20 DIAGNOSIS — I739 Peripheral vascular disease, unspecified: Secondary | ICD-10-CM | POA: Diagnosis not present

## 2017-03-20 NOTE — Telephone Encounter (Signed)
Left message to return call to our office. I have also called patient to see if they had another contact number and they did not have.

## 2017-03-23 DIAGNOSIS — Z87891 Personal history of nicotine dependence: Secondary | ICD-10-CM | POA: Diagnosis not present

## 2017-03-23 DIAGNOSIS — N390 Urinary tract infection, site not specified: Secondary | ICD-10-CM | POA: Diagnosis not present

## 2017-03-23 DIAGNOSIS — I10 Essential (primary) hypertension: Secondary | ICD-10-CM | POA: Diagnosis not present

## 2017-03-23 DIAGNOSIS — M810 Age-related osteoporosis without current pathological fracture: Secondary | ICD-10-CM | POA: Diagnosis not present

## 2017-03-23 DIAGNOSIS — S2242XD Multiple fractures of ribs, left side, subsequent encounter for fracture with routine healing: Secondary | ICD-10-CM | POA: Diagnosis not present

## 2017-03-23 DIAGNOSIS — Z792 Long term (current) use of antibiotics: Secondary | ICD-10-CM | POA: Diagnosis not present

## 2017-03-23 DIAGNOSIS — I739 Peripheral vascular disease, unspecified: Secondary | ICD-10-CM | POA: Diagnosis not present

## 2017-03-23 DIAGNOSIS — Z9181 History of falling: Secondary | ICD-10-CM | POA: Diagnosis not present

## 2017-03-23 DIAGNOSIS — M1990 Unspecified osteoarthritis, unspecified site: Secondary | ICD-10-CM | POA: Diagnosis not present

## 2017-03-23 DIAGNOSIS — D649 Anemia, unspecified: Secondary | ICD-10-CM | POA: Diagnosis not present

## 2017-03-23 DIAGNOSIS — K219 Gastro-esophageal reflux disease without esophagitis: Secondary | ICD-10-CM | POA: Diagnosis not present

## 2017-03-23 NOTE — Telephone Encounter (Signed)
Called back information given

## 2017-03-23 NOTE — Telephone Encounter (Signed)
vm left (from [place]) on referral line regarding call that she missed regarding patient. Please cb [phone]

## 2017-03-25 ENCOUNTER — Encounter: Payer: Self-pay | Admitting: Internal Medicine

## 2017-03-25 ENCOUNTER — Ambulatory Visit (INDEPENDENT_AMBULATORY_CARE_PROVIDER_SITE_OTHER): Payer: Medicare Other | Admitting: Internal Medicine

## 2017-03-25 ENCOUNTER — Other Ambulatory Visit: Payer: Self-pay | Admitting: Internal Medicine

## 2017-03-25 ENCOUNTER — Ambulatory Visit (INDEPENDENT_AMBULATORY_CARE_PROVIDER_SITE_OTHER): Payer: Medicare Other

## 2017-03-25 VITALS — BP 128/68 | HR 85 | Temp 98.5°F | Resp 14

## 2017-03-25 DIAGNOSIS — M546 Pain in thoracic spine: Secondary | ICD-10-CM | POA: Diagnosis not present

## 2017-03-25 DIAGNOSIS — R634 Abnormal weight loss: Secondary | ICD-10-CM | POA: Diagnosis not present

## 2017-03-25 DIAGNOSIS — M8000XD Age-related osteoporosis with current pathological fracture, unspecified site, subsequent encounter for fracture with routine healing: Secondary | ICD-10-CM

## 2017-03-25 DIAGNOSIS — E7800 Pure hypercholesterolemia, unspecified: Secondary | ICD-10-CM

## 2017-03-25 DIAGNOSIS — R600 Localized edema: Secondary | ICD-10-CM

## 2017-03-25 DIAGNOSIS — I10 Essential (primary) hypertension: Secondary | ICD-10-CM

## 2017-03-25 DIAGNOSIS — S2242XD Multiple fractures of ribs, left side, subsequent encounter for fracture with routine healing: Secondary | ICD-10-CM | POA: Diagnosis not present

## 2017-03-25 DIAGNOSIS — D649 Anemia, unspecified: Secondary | ICD-10-CM

## 2017-03-25 DIAGNOSIS — N289 Disorder of kidney and ureter, unspecified: Secondary | ICD-10-CM | POA: Diagnosis not present

## 2017-03-25 DIAGNOSIS — R918 Other nonspecific abnormal finding of lung field: Secondary | ICD-10-CM | POA: Diagnosis not present

## 2017-03-25 DIAGNOSIS — S22000A Wedge compression fracture of unspecified thoracic vertebra, initial encounter for closed fracture: Secondary | ICD-10-CM | POA: Diagnosis not present

## 2017-03-25 DIAGNOSIS — R938 Abnormal findings on diagnostic imaging of other specified body structures: Secondary | ICD-10-CM | POA: Diagnosis not present

## 2017-03-25 DIAGNOSIS — K219 Gastro-esophageal reflux disease without esophagitis: Secondary | ICD-10-CM

## 2017-03-25 DIAGNOSIS — E871 Hypo-osmolality and hyponatremia: Secondary | ICD-10-CM | POA: Diagnosis not present

## 2017-03-25 DIAGNOSIS — R9389 Abnormal findings on diagnostic imaging of other specified body structures: Secondary | ICD-10-CM

## 2017-03-25 DIAGNOSIS — M549 Dorsalgia, unspecified: Secondary | ICD-10-CM | POA: Insufficient documentation

## 2017-03-25 LAB — CBC WITH DIFFERENTIAL/PLATELET
BASOS PCT: 0.4 % (ref 0.0–3.0)
Basophils Absolute: 0 10*3/uL (ref 0.0–0.1)
EOS PCT: 2.1 % (ref 0.0–5.0)
Eosinophils Absolute: 0.2 10*3/uL (ref 0.0–0.7)
HCT: 33.5 % — ABNORMAL LOW (ref 36.0–46.0)
Hemoglobin: 10.9 g/dL — ABNORMAL LOW (ref 12.0–15.0)
LYMPHS ABS: 1.5 10*3/uL (ref 0.7–4.0)
Lymphocytes Relative: 18.5 % (ref 12.0–46.0)
MCHC: 32.5 g/dL (ref 30.0–36.0)
MCV: 95.3 fl (ref 78.0–100.0)
MONO ABS: 0.9 10*3/uL (ref 0.1–1.0)
Monocytes Relative: 11.1 % (ref 3.0–12.0)
NEUTROS ABS: 5.4 10*3/uL (ref 1.4–7.7)
NEUTROS PCT: 67.9 % (ref 43.0–77.0)
Platelets: 267 10*3/uL (ref 150.0–400.0)
RBC: 3.52 Mil/uL — AB (ref 3.87–5.11)
RDW: 14 % (ref 11.5–15.5)
WBC: 8 10*3/uL (ref 4.0–10.5)

## 2017-03-25 LAB — BASIC METABOLIC PANEL
BUN: 19 mg/dL (ref 6–23)
CALCIUM: 9 mg/dL (ref 8.4–10.5)
CO2: 27 mEq/L (ref 19–32)
Chloride: 100 mEq/L (ref 96–112)
Creatinine, Ser: 0.97 mg/dL (ref 0.40–1.20)
GFR: 56.86 mL/min — AB (ref >60.00)
GLUCOSE: 96 mg/dL (ref 70–99)
POTASSIUM: 4.3 meq/L (ref 3.5–5.1)
SODIUM: 132 meq/L — AB (ref 135–145)

## 2017-03-25 LAB — HEPATIC FUNCTION PANEL
ALBUMIN: 3.4 g/dL — AB (ref 3.5–5.2)
ALT: 11 U/L (ref 0–35)
AST: 15 U/L (ref 0–37)
Alkaline Phosphatase: 68 U/L (ref 39–117)
BILIRUBIN TOTAL: 0.3 mg/dL (ref 0.2–1.2)
Bilirubin, Direct: 0.1 mg/dL (ref 0.0–0.3)
Total Protein: 6.6 g/dL (ref 6.0–8.3)

## 2017-03-25 NOTE — Progress Notes (Signed)
Order placed for f/u labs.  

## 2017-03-25 NOTE — Progress Notes (Signed)
Patient ID: Kayla Galloway, female   DOB: [birth year], 83 y.o.   MRN: [id number]   Subjective:    Patient ID: Kayla Galloway, female    DOB: 05-16-1921, 83 y.o.   MRN: [id number]  HPI  Patient here for a scheduled follow up.  She is accompanied by her nephew Elta Guadeloupe - POA), sister and her caretaker Quita Skye).  History obtained from all of them.  Had a fall 02/03/17.  Suffered multiple rib fractures.  Was placed in rehab.  Recently discharged to home.  Has 24/7 sitters.  Increased cost with this.  Her nephew is POA and is coordinating her care.  Physical therapy coming out to her house.  Just started.  She ambulates with a walker.  Has been limited over the last few days secondary to increased back pain. Pain localized - in between her shoulder blades.  No fall.  No known injury.  States she was walking with her walker and felt something pull in her upper back.  Increased pain since.  She is eating.  No nausea or vomiting.  On abdominal pain.  Previous diarrhea.  This has resolved.  Discussed the possibility of assisted living.  She declines.     Past Medical History:  Diagnosis Date  . Anemia   . Arthritis   . Cancer ([place])    skin ca lesion of scalp- removed  . Depression   . DVT (deep venous thrombosis), left    bilateral, IVC filter 2010  . GERD (gastroesophageal reflux disease)   . Gout   . Hypercholesterolemia   . Hyperkalemia   . Hypertension    Dr. [name]  . Nephrolithiasis   . Obesity   . Osteoporosis   . Peripheral vascular disease ([place])   . 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 Spider veins    Past Surgical History:  Procedure Laterality Date  . CARDIAC CATHETERIZATION     2010 Does not see a cardiac  doctor  . Colonsopy    . DENTAL SURGERY    . ECTOPIC PREGNANCY SURGERY    . EYE SURGERY Bilateral 2011,2012   cataracts  . FRACTURE SURGERY  2009   hip, rod  from knee to hip  . HEMORRHOID SURGERY    . IVC filter Left    pt states it has turned side ways and could not be removed.  Marland Kitchen KYPHOPLASTY  09/03/2012   per patient, she has had kypho x 2:  Surgeon: Winfield Cunas, MD;  Location: [facility] [facility];  Service: Neurosurgery;  Laterality: N/A;  Thoracic eight Kyphoplasty  . OPEN REDUCTION INTERNAL FIXATION (ORIF) DISTAL RADIAL FRACTURE Left 09/06/2015   Procedure: OPEN REDUCTION INTERNAL FIXATION (ORIF) DISTAL RADIAL FRACTURE;  Surgeon: Hessie Knows, MD;  Location: [facility] OR[facility];  Service: Orthopedics;  Laterality: Left;  . RIGHT OOPHORECTOMY     partial-  . SKIN CANCER EXCISION     top of head  . TONSILLECTOMY     age 27  . VERTEBROPLASTY  12/31/2011   Procedure: VERTEBROPLASTY;  Surgeon: Winfield Cunas, MD;  Location: [place] [facility];  Service: Neurosurgery;  Laterality: N/A;  Thoracic eleven vertebroplasty   Family History  Problem Relation Age of Onset  . Heart disease Father        myocardial infarction  . Heart disease Brother        myocardial infarction  .  Lymphoma Sister   . Anesthesia problems Neg Hx   . Breast cancer Neg Hx   . Colon cancer Neg Hx    Social History   Social History  . Marital status: Widowed    Spouse name: N/A  . Number of children: 0  . Years of education: N/A   Social History Main Topics  . Smoking status: Never Smoker  . Smokeless tobacco: Never Used  . Alcohol use No  . Drug use: No  . Sexual activity: No   Other Topics Concern  . None   Social History Narrative  . None    Outpatient Encounter Prescriptions as of 03/25/2017  Medication Sig  . acetaminophen (TYLENOL) 650 MG CR tablet Take 650 mg by mouth every 8 (eight) hours as needed for pain.   . bifidobacterium infantis (ALIGN) capsule Take 1 capsule by mouth daily.  . Calcium 600-200 MG-UNIT tablet Take 1 tablet by mouth 3 (three) times daily with meals.  . cholecalciferol (VITAMIN D) 1000 units tablet Take 1,000 Units by mouth daily.  Marland Kitchen docusate  sodium (COLACE) 100 MG capsule Take 2 capsules (200 mg total) by mouth 2 (two) times daily.  . furosemide (LASIX) 20 MG tablet Take 1 tablet (20 mg total) by mouth 3 (three) times a week.  . Glucosamine-Chondroitin 250-200 MG TABS Take 1 tablet by mouth 2 (two) times daily.   . hydrOXYzine (ATARAX/VISTARIL) 10 MG tablet Take 25 mg by mouth 3 (three) times daily as needed for itching.  Marland Kitchen omeprazole (PRILOSEC) 20 MG capsule Take 1 capsule by mouth  daily (Patient taking differently: Take 20 mg by mouth daily. )  . phenylephrine (,USE FOR PREPARATION-H,) 0.25 % suppository Place 1 suppository rectally 2 (two) times daily.  . polyethylene glycol (MIRALAX / GLYCOLAX) packet Take 17 g by mouth daily as needed for mild constipation.  . ramipril (ALTACE) 10 MG capsule Take 1 capsule (10 mg total) by mouth daily.  . sertraline (ZOLOFT) 50 MG tablet Take 1 tablet by mouth  daily (Patient taking differently: Take 50 mg by mouth daily. )  . traMADol (ULTRAM) 50 MG tablet Take 1 tablet (50 mg total) by mouth every 6 (six) hours as needed for moderate pain. (Patient not taking: Reported on 03/25/2017)  . [DISCONTINUED] guaiFENesin-dextromethorphan (ROBITUSSIN DM) 100-10 MG/5ML syrup Take 5 mLs by mouth every 4 (four) hours as needed for cough.  . [DISCONTINUED] HYDROcodone-acetaminophen (NORCO/VICODIN) 5-325 MG tablet Take 1 tablet by mouth every 6 (six) hours as needed for severe pain.  . [DISCONTINUED] oxyCODONE (OXYCONTIN) 15 mg 12 hr tablet Take 1 tablet (15 mg total) by mouth every 12 (twelve) hours.   No facility-administered encounter medications on file as of 03/25/2017.     Review of Systems  Constitutional: Positive for fatigue. Negative for appetite change and unexpected weight change.  HENT: Negative for congestion and sinus pressure.   Respiratory: Negative for cough, chest tightness and shortness of breath.   Cardiovascular: Negative for chest pain, palpitations and leg swelling.       Lower  extremity swelling much improved.    Gastrointestinal: Negative for abdominal pain, nausea and vomiting.       Diarrhea resolved.    Genitourinary: Negative for difficulty urinating and dysuria.  Musculoskeletal: Positive for back pain. Negative for joint swelling.  Skin: Negative for color change and rash.  Neurological: Negative for dizziness, light-headedness and headaches.  Psychiatric/Behavioral: Negative for agitation and dysphoric mood.       Objective:  Physical Exam  Constitutional: She appears well-developed and well-nourished. No distress.  HENT:  Nose: Nose normal.  Mouth/Throat: Oropharynx is clear and moist.  Neck: Neck supple. No thyromegaly present.  Cardiovascular: Normal rate and regular rhythm.   Pulmonary/Chest: Breath sounds normal. No respiratory distress. She has no wheezes.  Abdominal: Soft. Bowel sounds are normal. There is no tenderness.  Musculoskeletal: She exhibits no edema or tenderness.  Increased pain in her left lateral ribs with deep breathing.  Increased pain to palpation thoracic spine.    Lymphadenopathy:    She has no cervical adenopathy.  Skin: No rash noted. No erythema.  No bruising.   Psychiatric: She has a normal mood and affect. Her behavior is normal.    BP 128/68   Pulse 85   Temp 98.5 F (36.9 C) (Oral)   Resp 14   SpO2 93%  Wt Readings from Last 3 Encounters:  02/04/17 126 lb 9.6 oz (57.4 kg)  12/30/16 127 lb (57.6 kg)  12/09/16 125 lb (56.7 kg)     Lab Results  Component Value Date   WBC 8.0 03/25/2017   HGB 10.9 (L) 03/25/2017   HCT 33.5 (L) 03/25/2017   PLT 267.0 03/25/2017   GLUCOSE 96 03/25/2017   CHOL 229 (H) 07/12/2012   TRIG 87.0 07/12/2012   HDL 86.30 07/12/2012   LDLDIRECT 135.9 07/12/2012   ALT 11 03/25/2017   AST 15 03/25/2017   NA 132 (L) 03/25/2017   K 4.3 03/25/2017   CL 100 03/25/2017   CREATININE 0.97 03/25/2017   BUN 19 03/25/2017   CO2 27 03/25/2017   TSH 1.61 06/11/2016   INR 1.00  01/24/2015    Dg Ribs Unilateral W/chest Left  Result Date: 02/03/2017 CLINICAL DATA:  83 y/o F; status post fall with left-sided rib pain. EXAM: LEFT RIBS AND CHEST - 3+ VIEW COMPARISON:  None. FINDINGS: Mildly displaced fractures of the left sixth and eighth ribs. No additional appreciable rib fracture identified. IMPRESSION: Mildly displaced fractures of the left sixth and eighth ribs. No additional appreciable rib fracture identified. Electronically Signed   By: Kristine Garbe M.D.   On: 02/03/2017 20:56   Dg Scapula Left  Result Date: 02/03/2017 CLINICAL DATA:  Tripped over garden hose and fell on left side. Left shoulder pain. Concern for scapular injury. Initial encounter. EXAM: LEFT SCAPULA - 2+ VIEWS COMPARISON:  Left shoulder radiographs performed earlier today at 8:16 p.m. FINDINGS: The left scapula appears grossly intact. The left humeral head remains seated at the glenoid fossa. Mild degenerative change is noted at the left acromioclavicular joint. The previously noted mildly displaced left lateral eighth rib fracture is not imaged on this study. IMPRESSION: No evidence of fracture or dislocation. Left scapula appears intact. Electronically Signed   By: Garald Balding M.D.   On: 02/03/2017 22:13   Dg Ankle Complete Left  Result Date: 02/03/2017 CLINICAL DATA:  Tripped over garden hose and fell on left side. Left ankle pain. Initial encounter. EXAM: LEFT ANKLE COMPLETE - 3+ VIEW COMPARISON:  Left tibia/fibula radiographs performed 01/12/2015 FINDINGS: There is no evidence of fracture or dislocation. The ankle mortise is intact; the interosseous space is within normal limits. No talar tilt or subluxation is seen. The joint spaces are preserved. Mild medial soft tissue swelling is noted. Diffuse vascular calcifications are seen. IMPRESSION: 1. No evidence of fracture or dislocation. 2. Diffuse vascular calcifications seen. Electronically Signed   By: Garald Balding M.D.   On:  02/03/2017 22:18  Ct Head Wo Contrast  Result Date: 02/03/2017 CLINICAL DATA:  Tripped over a garden hose and fell onto LEFT side, complaining of LEFT-sided shoulder pain, bruising to LEFT side of face, history hypertension EXAM: CT HEAD WITHOUT CONTRAST CT CERVICAL SPINE WITHOUT CONTRAST TECHNIQUE: Multidetector CT imaging of the head and cervical spine was performed following the standard protocol without intravenous contrast. Multiplanar CT image reconstructions of the cervical spine were also generated. COMPARISON:  CT head 09/02/2015 FINDINGS: CT HEAD FINDINGS Brain: Generalized atrophy. Normal ventricular morphology. No midline shift or mass effect. Small vessel chronic ischemic changes of deep cerebral white matter. No intracranial hemorrhage, mass lesion, evidence of acute infarction, or extra-axial fluid collection. Vascular: Atherosclerotic calcifications of the internal carotid and vertebral arteries at skullbase. Skull: Intact Sinuses/Orbits: Chronic mucosal thickening and partial opacity of the LEFT maxillary sinus with osseous wall thickening. Remaining paranasal sinuses and mastoid air cells clear Other: N/A CT CERVICAL SPINE FINDINGS Alignment: 3.3 mm of anterolisthesis at C3-C4. Less than 2 mm of retrolisthesis at C4-C5, C5-C6, and C6-C7. Skull base and vertebrae: Visualized skullbase intact. Vertebral body heights maintained. No fracture or bone destruction. Mild scattered facet degenerative changes cervical spine. Soft tissues and spinal canal: Prevertebral soft tissues normal thickness. Atherosclerotic calcifications at the carotid bifurcations bilaterally and at the great vessels in the lower cervical region. Disc levels: Multilevel disc space narrowing and scattered endplate spurs. Scattered bony neural foraminal narrowing. Upper chest: Biapical lung scarring. Other: Degenerative changes BILATERAL temporomandibular joints. IMPRESSION: Atrophy with small-vessel chronic ischemic changes of  deep cerebral white matter. No acute intracranial abnormalities. Chronic LEFT maxillary sinus disease. Osseous demineralization with degenerative disc and facet disease changes of the cervical spine associated with 3.3 mm of anterolisthesis at C3-C4 and mild degrees of retrolisthesis at C4-C5 through C6-C7. No acute fractures identified. Electronically Signed   By: Lavonia Dana M.D.   On: 02/03/2017 20:19   Ct Cervical Spine Wo Contrast  Result Date: 02/03/2017 CLINICAL DATA:  Tripped over a garden hose and fell onto LEFT side, complaining of LEFT-sided shoulder pain, bruising to LEFT side of face, history hypertension EXAM: CT HEAD WITHOUT CONTRAST CT CERVICAL SPINE WITHOUT CONTRAST TECHNIQUE: Multidetector CT imaging of the head and cervical spine was performed following the standard protocol without intravenous contrast. Multiplanar CT image reconstructions of the cervical spine were also generated. COMPARISON:  CT head 09/02/2015 FINDINGS: CT HEAD FINDINGS Brain: Generalized atrophy. Normal ventricular morphology. No midline shift or mass effect. Small vessel chronic ischemic changes of deep cerebral white matter. No intracranial hemorrhage, mass lesion, evidence of acute infarction, or extra-axial fluid collection. Vascular: Atherosclerotic calcifications of the internal carotid and vertebral arteries at skullbase. Skull: Intact Sinuses/Orbits: Chronic mucosal thickening and partial opacity of the LEFT maxillary sinus with osseous wall thickening. Remaining paranasal sinuses and mastoid air cells clear Other: N/A CT CERVICAL SPINE FINDINGS Alignment: 3.3 mm of anterolisthesis at C3-C4. Less than 2 mm of retrolisthesis at C4-C5, C5-C6, and C6-C7. Skull base and vertebrae: Visualized skullbase intact. Vertebral body heights maintained. No fracture or bone destruction. Mild scattered facet degenerative changes cervical spine. Soft tissues and spinal canal: Prevertebral soft tissues normal thickness.  Atherosclerotic calcifications at the carotid bifurcations bilaterally and at the great vessels in the lower cervical region. Disc levels: Multilevel disc space narrowing and scattered endplate spurs. Scattered bony neural foraminal narrowing. Upper chest: Biapical lung scarring. Other: Degenerative changes BILATERAL temporomandibular joints. IMPRESSION: Atrophy with small-vessel chronic ischemic changes of deep cerebral white matter. No acute  intracranial abnormalities. Chronic LEFT maxillary sinus disease. Osseous demineralization with degenerative disc and facet disease changes of the cervical spine associated with 3.3 mm of anterolisthesis at C3-C4 and mild degrees of retrolisthesis at C4-C5 through C6-C7. No acute fractures identified. Electronically Signed   By: Lavonia Dana M.D.   On: 02/03/2017 20:19   Dg Shoulder Left  Result Date: 02/03/2017 CLINICAL DATA:  83 year old female with left shoulder pain. EXAM: LEFT SHOULDER - 2+ VIEW COMPARISON:  Left rib radiograph dated 02/03/2017 and the left ovary radiograph dated 02/03/2017 FINDINGS: There is no acute fracture of the left shoulder. No dislocation. There are chronic degenerative changes of the left humeral head. The bones are osteopenic. Partially visualized minimally displaced left lateral rib fracture. This is better evaluated on the rib series radiograph. Multilevel thoracic compression deformities and vertebroplasty changes. Stable cardiac silhouette. IMPRESSION: 1. No acute fracture or dislocation of the left shoulder. 2. Minimally displaced fracture of the lateral aspect of a left rib. Electronically Signed   By: Anner Crete M.D.   On: 02/03/2017 20:58       Assessment & Plan:   Problem List Items Addressed This Visit    Anemia    hgb has been stable.  Recheck cbc.        Back pain    Increased pain over the last 3 days.  Tylenol helps.  Discussed taking scheduled tylenol.  Check xray.  Has a h/o compression fractures.         Relevant Orders   DG Thoracic Spine 2 View (Completed)   Bilateral lower extremity edema    Much improved.  Lesions - resolved.        GERD (gastroesophageal reflux disease)    Controlled on omeprazole.        Hypercholesterolemia    Follow lipid panel.       Relevant Orders   Hepatic function panel (Completed)   Hypertension    Blood pressure on recheck improved.  Same medication regimen.  Follow pressures.  Follow metabolic panel.        Relevant Orders   CBC with Differential/Platelet (Completed)   Basic metabolic panel (Completed)   Hyponatremia    Found during recent hospitalization - low sodium.  Was improving.  Recheck metabolic panel.       Osteoporosis    Desires no further medication.  Has declined further testing.        Relevant Medications   cholecalciferol (VITAMIN D) 1000 units tablet   Renal insufficiency    Stay hydrated.  Recheck metabolic panel.        Rib fractures    Recent rib fractures.  Pain improved.  Follow.  Instructed on importance of taking good deed breaths.  Follow.        Weight loss    Weight stable from the previous checks.  States has good appetite.  Drinks ensure. Follow.        Other Visit Diagnoses    Abnormal CXR    -  Primary   Relevant Orders   DG Chest 2 View (Completed)     I spent 45 minutes with the patient and more than 50% of the time was spent in consultation regarding the above.  Time spent obtaining history regarding her hospitalization and recent rehab.  Also discussed her current symptoms and living situation.  Discussed plans for evaluation and treatment.     [name], MD

## 2017-03-26 ENCOUNTER — Other Ambulatory Visit: Payer: Self-pay | Admitting: Internal Medicine

## 2017-03-26 ENCOUNTER — Encounter: Payer: Self-pay | Admitting: Internal Medicine

## 2017-03-26 ENCOUNTER — Other Ambulatory Visit (INDEPENDENT_AMBULATORY_CARE_PROVIDER_SITE_OTHER): Payer: Medicare Other

## 2017-03-26 DIAGNOSIS — M810 Age-related osteoporosis without current pathological fracture: Secondary | ICD-10-CM | POA: Diagnosis not present

## 2017-03-26 DIAGNOSIS — D649 Anemia, unspecified: Secondary | ICD-10-CM | POA: Diagnosis not present

## 2017-03-26 DIAGNOSIS — N390 Urinary tract infection, site not specified: Secondary | ICD-10-CM | POA: Diagnosis not present

## 2017-03-26 DIAGNOSIS — I739 Peripheral vascular disease, unspecified: Secondary | ICD-10-CM | POA: Diagnosis not present

## 2017-03-26 DIAGNOSIS — Z9181 History of falling: Secondary | ICD-10-CM | POA: Diagnosis not present

## 2017-03-26 DIAGNOSIS — I10 Essential (primary) hypertension: Secondary | ICD-10-CM | POA: Diagnosis not present

## 2017-03-26 DIAGNOSIS — Z792 Long term (current) use of antibiotics: Secondary | ICD-10-CM | POA: Diagnosis not present

## 2017-03-26 DIAGNOSIS — Z87891 Personal history of nicotine dependence: Secondary | ICD-10-CM | POA: Diagnosis not present

## 2017-03-26 DIAGNOSIS — S2242XD Multiple fractures of ribs, left side, subsequent encounter for fracture with routine healing: Secondary | ICD-10-CM | POA: Diagnosis not present

## 2017-03-26 DIAGNOSIS — M1990 Unspecified osteoarthritis, unspecified site: Secondary | ICD-10-CM | POA: Diagnosis not present

## 2017-03-26 DIAGNOSIS — K219 Gastro-esophageal reflux disease without esophagitis: Secondary | ICD-10-CM | POA: Diagnosis not present

## 2017-03-26 LAB — VITAMIN B12: VITAMIN B 12: 357 pg/mL (ref 211–911)

## 2017-03-26 LAB — FERRITIN: FERRITIN: 190.8 ng/mL (ref 10.0–291.0)

## 2017-03-26 LAB — IBC PANEL
IRON: 27 ug/dL — AB (ref 42–145)
SATURATION RATIOS: 9.2 % — AB (ref 20.0–50.0)
TRANSFERRIN: 210 mg/dL — AB (ref 212.0–360.0)

## 2017-03-26 NOTE — Assessment & Plan Note (Signed)
Stay hydrated.  Recheck metabolic panel.   

## 2017-03-26 NOTE — Assessment & Plan Note (Signed)
Weight stable from the previous checks.  States has good appetite.  Drinks ensure. Follow.

## 2017-03-26 NOTE — Assessment & Plan Note (Signed)
Much improved.  Lesions - resolved.

## 2017-03-26 NOTE — Assessment & Plan Note (Signed)
Follow lipid panel.   

## 2017-03-26 NOTE — Assessment & Plan Note (Signed)
Desires no further medication.  Has declined further testing.

## 2017-03-26 NOTE — Assessment & Plan Note (Signed)
Blood pressure on recheck improved.  Same medication regimen.  Follow pressures.  Follow metabolic panel.   

## 2017-03-26 NOTE — Assessment & Plan Note (Signed)
hgb has been stable.  Recheck cbc.

## 2017-03-26 NOTE — Assessment & Plan Note (Signed)
Increased pain over the last 3 days.  Tylenol helps.  Discussed taking scheduled tylenol.  Check xray.  Has a h/o compression fractures.

## 2017-03-26 NOTE — Assessment & Plan Note (Signed)
Found during recent hospitalization - low sodium.  Was improving.  Recheck metabolic panel.

## 2017-03-26 NOTE — Assessment & Plan Note (Signed)
Recent rib fractures.  Pain improved.  Follow.  Instructed on importance of taking good deed breaths.  Follow.

## 2017-03-26 NOTE — Assessment & Plan Note (Signed)
Controlled on omeprazole.   

## 2017-03-27 ENCOUNTER — Telehealth: Payer: Self-pay | Admitting: *Deleted

## 2017-03-27 NOTE — Telephone Encounter (Signed)
Please advise 

## 2017-03-27 NOTE — Telephone Encounter (Signed)
Ok

## 2017-03-27 NOTE — Telephone Encounter (Signed)
Left Kayla Galloway a message to return call back.

## 2017-03-27 NOTE — Telephone Encounter (Signed)
[facility]phanie from [facility] has requested verbal orders for occupational therapy orders with the frequency 1 time a week for 1 week and 2 times a week for 4 weeks Contact Colletta [place] [phone]

## 2017-03-30 ENCOUNTER — Other Ambulatory Visit: Payer: Self-pay | Admitting: Internal Medicine

## 2017-03-30 ENCOUNTER — Telehealth: Payer: Self-pay | Admitting: Internal Medicine

## 2017-03-30 DIAGNOSIS — N390 Urinary tract infection, site not specified: Secondary | ICD-10-CM | POA: Diagnosis not present

## 2017-03-30 DIAGNOSIS — M546 Pain in thoracic spine: Secondary | ICD-10-CM

## 2017-03-30 DIAGNOSIS — Z9181 History of falling: Secondary | ICD-10-CM | POA: Diagnosis not present

## 2017-03-30 DIAGNOSIS — S22000D Wedge compression fracture of unspecified thoracic vertebra, subsequent encounter for fracture with routine healing: Secondary | ICD-10-CM

## 2017-03-30 DIAGNOSIS — I739 Peripheral vascular disease, unspecified: Secondary | ICD-10-CM | POA: Diagnosis not present

## 2017-03-30 DIAGNOSIS — M1990 Unspecified osteoarthritis, unspecified site: Secondary | ICD-10-CM | POA: Diagnosis not present

## 2017-03-30 DIAGNOSIS — D649 Anemia, unspecified: Secondary | ICD-10-CM | POA: Diagnosis not present

## 2017-03-30 DIAGNOSIS — M810 Age-related osteoporosis without current pathological fracture: Secondary | ICD-10-CM | POA: Diagnosis not present

## 2017-03-30 DIAGNOSIS — Z792 Long term (current) use of antibiotics: Secondary | ICD-10-CM | POA: Diagnosis not present

## 2017-03-30 DIAGNOSIS — K219 Gastro-esophageal reflux disease without esophagitis: Secondary | ICD-10-CM | POA: Diagnosis not present

## 2017-03-30 DIAGNOSIS — I10 Essential (primary) hypertension: Secondary | ICD-10-CM | POA: Diagnosis not present

## 2017-03-30 DIAGNOSIS — Z87891 Personal history of nicotine dependence: Secondary | ICD-10-CM | POA: Diagnosis not present

## 2017-03-30 DIAGNOSIS — S2242XD Multiple fractures of ribs, left side, subsequent encounter for fracture with routine healing: Secondary | ICD-10-CM | POA: Diagnosis not present

## 2017-03-30 NOTE — Telephone Encounter (Signed)
I have reviewed results with caregiver today. This message was received any information that I can give them?

## 2017-03-30 NOTE — Telephone Encounter (Signed)
Hold on PT until I can get ortho to evaluate and give recs.  Order placed for urgent referral.  She was only taking one tylenol previously and this was helping.  If she is still only taking one tylenol tid then can increase to two tylenol tid.

## 2017-03-30 NOTE — Progress Notes (Signed)
Order placed for ortho referral.   

## 2017-03-30 NOTE — Telephone Encounter (Signed)
Called and spoke to Kayla Galloway Pt will their on Wednesday and she needed specific things that should be avoided. Bending, stretching ect.  Patient is only taking tylenol otc up to tid depending on pain. Her pain right now is 7/10. She was given script for tramadol but she does not have any longer.

## 2017-03-30 NOTE — Telephone Encounter (Signed)
Kayla Galloway [phone] called from [facility] regarding compression fracture level of fracture any restrictions. Which specialist she will be going to? Pt wants to know if she can stronger pain medication?  Pt does not want hydrocodone. vm is ok.

## 2017-03-30 NOTE — Telephone Encounter (Signed)
I had mentioned Dr Sharlet Salina initially, but if I can get her in earlier with ortho - we can refer to ortho.  Regarding pain medication, what is she taking now and how?

## 2017-03-30 NOTE — Telephone Encounter (Signed)
Kayla Galloway has been informed. 

## 2017-03-31 DIAGNOSIS — I739 Peripheral vascular disease, unspecified: Secondary | ICD-10-CM | POA: Diagnosis not present

## 2017-03-31 DIAGNOSIS — M810 Age-related osteoporosis without current pathological fracture: Secondary | ICD-10-CM | POA: Diagnosis not present

## 2017-03-31 DIAGNOSIS — K219 Gastro-esophageal reflux disease without esophagitis: Secondary | ICD-10-CM | POA: Diagnosis not present

## 2017-03-31 DIAGNOSIS — D649 Anemia, unspecified: Secondary | ICD-10-CM | POA: Diagnosis not present

## 2017-03-31 DIAGNOSIS — I10 Essential (primary) hypertension: Secondary | ICD-10-CM | POA: Diagnosis not present

## 2017-03-31 DIAGNOSIS — Z792 Long term (current) use of antibiotics: Secondary | ICD-10-CM | POA: Diagnosis not present

## 2017-03-31 DIAGNOSIS — Z9181 History of falling: Secondary | ICD-10-CM | POA: Diagnosis not present

## 2017-03-31 DIAGNOSIS — S2242XD Multiple fractures of ribs, left side, subsequent encounter for fracture with routine healing: Secondary | ICD-10-CM | POA: Diagnosis not present

## 2017-03-31 DIAGNOSIS — M1990 Unspecified osteoarthritis, unspecified site: Secondary | ICD-10-CM | POA: Diagnosis not present

## 2017-03-31 DIAGNOSIS — Z87891 Personal history of nicotine dependence: Secondary | ICD-10-CM | POA: Diagnosis not present

## 2017-03-31 DIAGNOSIS — N390 Urinary tract infection, site not specified: Secondary | ICD-10-CM | POA: Diagnosis not present

## 2017-03-31 NOTE — Telephone Encounter (Signed)
Kayla Galloway lvm and said that she missed a call from Dr.Dettman's office. She said that she had stepped away for a minute. Please cb [phone]  Waiting on [facility] for ortho appt.

## 2017-03-31 NOTE — Telephone Encounter (Signed)
Spoke with PT gave information still need to call patient.

## 2017-03-31 NOTE — Telephone Encounter (Signed)
Called patient as well as PT and l/m in regards to both questions.

## 2017-04-02 NOTE — Telephone Encounter (Signed)
Lm to call back

## 2017-04-03 NOTE — Telephone Encounter (Signed)
Called patient l/m.

## 2017-04-06 DIAGNOSIS — M4854XA Collapsed vertebra, not elsewhere classified, thoracic region, initial encounter for fracture: Secondary | ICD-10-CM | POA: Diagnosis not present

## 2017-04-07 ENCOUNTER — Ambulatory Visit: Payer: Self-pay | Admitting: Internal Medicine

## 2017-04-08 ENCOUNTER — Telehealth: Payer: Self-pay | Admitting: Internal Medicine

## 2017-04-08 DIAGNOSIS — N390 Urinary tract infection, site not specified: Secondary | ICD-10-CM | POA: Diagnosis not present

## 2017-04-08 DIAGNOSIS — I739 Peripheral vascular disease, unspecified: Secondary | ICD-10-CM | POA: Diagnosis not present

## 2017-04-08 DIAGNOSIS — M1990 Unspecified osteoarthritis, unspecified site: Secondary | ICD-10-CM | POA: Diagnosis not present

## 2017-04-08 DIAGNOSIS — M810 Age-related osteoporosis without current pathological fracture: Secondary | ICD-10-CM | POA: Diagnosis not present

## 2017-04-08 DIAGNOSIS — Z792 Long term (current) use of antibiotics: Secondary | ICD-10-CM | POA: Diagnosis not present

## 2017-04-08 DIAGNOSIS — K219 Gastro-esophageal reflux disease without esophagitis: Secondary | ICD-10-CM | POA: Diagnosis not present

## 2017-04-08 DIAGNOSIS — Z87891 Personal history of nicotine dependence: Secondary | ICD-10-CM | POA: Diagnosis not present

## 2017-04-08 DIAGNOSIS — I10 Essential (primary) hypertension: Secondary | ICD-10-CM | POA: Diagnosis not present

## 2017-04-08 DIAGNOSIS — S2242XD Multiple fractures of ribs, left side, subsequent encounter for fracture with routine healing: Secondary | ICD-10-CM | POA: Diagnosis not present

## 2017-04-08 DIAGNOSIS — Z9181 History of falling: Secondary | ICD-10-CM | POA: Diagnosis not present

## 2017-04-08 DIAGNOSIS — D649 Anemia, unspecified: Secondary | ICD-10-CM | POA: Diagnosis not present

## 2017-04-08 NOTE — Telephone Encounter (Signed)
Lana from [facility] called and is requesting verbal orders for change in visitation, and also to report of crackling in both upper lungs and low bacterial. She is requesting skilled nursing for 1x 1 for 3 weeks, 1 every other week for 5 weeks, and 3 as needed. Please advise, thank you!  Call [place] @ [phone]

## 2017-04-09 NOTE — Telephone Encounter (Signed)
Ok.  Noted.  I will be out of the office this pm and will not be back until next week.  If acute symptoms, needs to be evaluated.  [facility] for nursing orders.

## 2017-04-09 NOTE — Telephone Encounter (Signed)
Not sure that I understand the message.  What is "low bacterial"?  If concern regarding sob, infection, etc, needs to be evaluated.  Ok to for orders of nursing.

## 2017-04-09 NOTE — Telephone Encounter (Signed)
Sorry I believe she ment low bilaterally.   I have left a message to have Kayla Galloway call me back.

## 2017-04-09 NOTE — Telephone Encounter (Signed)
Family informed.

## 2017-04-09 NOTE — Telephone Encounter (Signed)
Please advise if okay with orders I will call Lana back, thanks

## 2017-04-14 ENCOUNTER — Telehealth: Payer: Self-pay | Admitting: *Deleted

## 2017-04-14 NOTE — Telephone Encounter (Signed)
Left a message for Elane Fritz  To return a call again.

## 2017-04-14 NOTE — Telephone Encounter (Signed)
Lanna from [facility] has requested continue care for skilled care to monitor lung sound, edema and pain. With a frequency of 1 time a week of every other week with a total of 3 visit.  Kayla Galloway contact [phone] A voice maill can be left

## 2017-04-15 NOTE — Telephone Encounter (Signed)
Left message to return call to our office.  

## 2017-04-16 NOTE — Telephone Encounter (Signed)
Verbal given to [facility] for nursing visit. Will call with any problems.

## 2017-04-17 ENCOUNTER — Other Ambulatory Visit: Payer: Self-pay | Admitting: Internal Medicine

## 2017-04-17 DIAGNOSIS — I739 Peripheral vascular disease, unspecified: Secondary | ICD-10-CM | POA: Diagnosis not present

## 2017-04-17 DIAGNOSIS — D649 Anemia, unspecified: Secondary | ICD-10-CM | POA: Diagnosis not present

## 2017-04-17 DIAGNOSIS — Z87891 Personal history of nicotine dependence: Secondary | ICD-10-CM | POA: Diagnosis not present

## 2017-04-17 DIAGNOSIS — Z9181 History of falling: Secondary | ICD-10-CM | POA: Diagnosis not present

## 2017-04-17 DIAGNOSIS — K219 Gastro-esophageal reflux disease without esophagitis: Secondary | ICD-10-CM | POA: Diagnosis not present

## 2017-04-17 DIAGNOSIS — F329 Major depressive disorder, single episode, unspecified: Secondary | ICD-10-CM | POA: Diagnosis not present

## 2017-04-17 DIAGNOSIS — I10 Essential (primary) hypertension: Secondary | ICD-10-CM | POA: Diagnosis not present

## 2017-04-17 DIAGNOSIS — M1990 Unspecified osteoarthritis, unspecified site: Secondary | ICD-10-CM | POA: Diagnosis not present

## 2017-04-17 DIAGNOSIS — M810 Age-related osteoporosis without current pathological fracture: Secondary | ICD-10-CM | POA: Diagnosis not present

## 2017-04-17 DIAGNOSIS — N390 Urinary tract infection, site not specified: Secondary | ICD-10-CM | POA: Diagnosis not present

## 2017-04-17 DIAGNOSIS — Z792 Long term (current) use of antibiotics: Secondary | ICD-10-CM | POA: Diagnosis not present

## 2017-04-20 ENCOUNTER — Other Ambulatory Visit: Payer: Self-pay | Admitting: Internal Medicine

## 2017-04-20 MED ORDER — RAMIPRIL 5 MG PO CAPS: 5.0000 mg | ORAL_CAPSULE | Freq: Every day | ORAL | 0 refills | Status: DC

## 2017-04-20 NOTE — Progress Notes (Signed)
Corrected medications.  Ramipril 5mg  added to med list.  D/c'd hydroxyzine.

## 2017-04-29 DIAGNOSIS — K219 Gastro-esophageal reflux disease without esophagitis: Secondary | ICD-10-CM | POA: Diagnosis not present

## 2017-04-29 DIAGNOSIS — Z792 Long term (current) use of antibiotics: Secondary | ICD-10-CM | POA: Diagnosis not present

## 2017-04-29 DIAGNOSIS — N390 Urinary tract infection, site not specified: Secondary | ICD-10-CM | POA: Diagnosis not present

## 2017-04-29 DIAGNOSIS — I10 Essential (primary) hypertension: Secondary | ICD-10-CM | POA: Diagnosis not present

## 2017-04-29 DIAGNOSIS — Z9181 History of falling: Secondary | ICD-10-CM | POA: Diagnosis not present

## 2017-04-29 DIAGNOSIS — M810 Age-related osteoporosis without current pathological fracture: Secondary | ICD-10-CM | POA: Diagnosis not present

## 2017-04-29 DIAGNOSIS — D649 Anemia, unspecified: Secondary | ICD-10-CM | POA: Diagnosis not present

## 2017-04-29 DIAGNOSIS — I739 Peripheral vascular disease, unspecified: Secondary | ICD-10-CM | POA: Diagnosis not present

## 2017-04-29 DIAGNOSIS — S2242XD Multiple fractures of ribs, left side, subsequent encounter for fracture with routine healing: Secondary | ICD-10-CM | POA: Diagnosis not present

## 2017-04-29 DIAGNOSIS — Z87891 Personal history of nicotine dependence: Secondary | ICD-10-CM | POA: Diagnosis not present

## 2017-04-29 DIAGNOSIS — M1990 Unspecified osteoarthritis, unspecified site: Secondary | ICD-10-CM | POA: Diagnosis not present

## 2017-05-13 ENCOUNTER — Other Ambulatory Visit: Payer: Self-pay | Admitting: Internal Medicine

## 2017-05-14 DIAGNOSIS — K219 Gastro-esophageal reflux disease without esophagitis: Secondary | ICD-10-CM | POA: Diagnosis not present

## 2017-05-14 DIAGNOSIS — M810 Age-related osteoporosis without current pathological fracture: Secondary | ICD-10-CM | POA: Diagnosis not present

## 2017-05-14 DIAGNOSIS — I10 Essential (primary) hypertension: Secondary | ICD-10-CM | POA: Diagnosis not present

## 2017-05-14 DIAGNOSIS — N390 Urinary tract infection, site not specified: Secondary | ICD-10-CM | POA: Diagnosis not present

## 2017-05-14 DIAGNOSIS — I739 Peripheral vascular disease, unspecified: Secondary | ICD-10-CM | POA: Diagnosis not present

## 2017-05-14 DIAGNOSIS — S2242XD Multiple fractures of ribs, left side, subsequent encounter for fracture with routine healing: Secondary | ICD-10-CM | POA: Diagnosis not present

## 2017-05-14 DIAGNOSIS — Z87891 Personal history of nicotine dependence: Secondary | ICD-10-CM | POA: Diagnosis not present

## 2017-05-14 DIAGNOSIS — D649 Anemia, unspecified: Secondary | ICD-10-CM | POA: Diagnosis not present

## 2017-05-14 DIAGNOSIS — Z792 Long term (current) use of antibiotics: Secondary | ICD-10-CM | POA: Diagnosis not present

## 2017-05-14 DIAGNOSIS — M1990 Unspecified osteoarthritis, unspecified site: Secondary | ICD-10-CM | POA: Diagnosis not present

## 2017-05-14 DIAGNOSIS — Z9181 History of falling: Secondary | ICD-10-CM | POA: Diagnosis not present

## 2017-05-16 ENCOUNTER — Other Ambulatory Visit: Payer: Self-pay | Admitting: Internal Medicine

## 2017-05-20 ENCOUNTER — Other Ambulatory Visit: Payer: Self-pay | Admitting: Internal Medicine

## 2017-06-10 ENCOUNTER — Ambulatory Visit (INDEPENDENT_AMBULATORY_CARE_PROVIDER_SITE_OTHER): Payer: Medicare Other | Admitting: Internal Medicine

## 2017-06-10 VITALS — BP 140/64 | HR 82 | Temp 98.0°F | Resp 14 | Wt 119.0 lb

## 2017-06-10 DIAGNOSIS — R21 Rash and other nonspecific skin eruption: Secondary | ICD-10-CM

## 2017-06-10 DIAGNOSIS — E7800 Pure hypercholesterolemia, unspecified: Secondary | ICD-10-CM | POA: Diagnosis not present

## 2017-06-10 DIAGNOSIS — D649 Anemia, unspecified: Secondary | ICD-10-CM | POA: Diagnosis not present

## 2017-06-10 DIAGNOSIS — E871 Hypo-osmolality and hyponatremia: Secondary | ICD-10-CM | POA: Diagnosis not present

## 2017-06-10 DIAGNOSIS — R634 Abnormal weight loss: Secondary | ICD-10-CM

## 2017-06-10 DIAGNOSIS — I10 Essential (primary) hypertension: Secondary | ICD-10-CM | POA: Diagnosis not present

## 2017-06-10 DIAGNOSIS — L97209 Non-pressure chronic ulcer of unspecified calf with unspecified severity: Secondary | ICD-10-CM

## 2017-06-10 DIAGNOSIS — R600 Localized edema: Secondary | ICD-10-CM | POA: Diagnosis not present

## 2017-06-10 DIAGNOSIS — I872 Venous insufficiency (chronic) (peripheral): Secondary | ICD-10-CM

## 2017-06-10 MED ORDER — NYSTATIN 100000 UNIT/GM EX CREA: 1.0000 "application " | TOPICAL_CREAM | Freq: Two times a day (BID) | CUTANEOUS | 1 refills | Status: DC

## 2017-06-10 NOTE — Progress Notes (Signed)
Patient ID: Kayla Galloway, female   DOB: 07-24-1921, 83 y.o.   MRN: [id number]   Subjective:    Patient ID: Kayla Galloway, female    DOB: May 04, 1921, 83 y.o.   MRN: [id number]  HPI  Patient here for a scheduled follow up.  She is accompanied by her nephew.  She is getting around better.  Decreased pain.  Breathing stable.  No chest pain.  No acid reflux.  No abdominal pain.  Bowels moving.  Swelling better - lower extremities.  Weight has decreased.  Eating.  Rash under her breast.     Past Medical History:  Diagnosis Date  . Anemia   . Arthritis   . Cancer ([place])    skin ca lesion of scalp- removed  . Depression   . DVT (deep venous thrombosis), left    bilateral, IVC filter 2010  . GERD (gastroesophageal reflux disease)   . Gout   . Hypercholesterolemia   . Hyperkalemia   . Hypertension    Dr. [name]  . Nephrolithiasis   . Obesity   . Osteoporosis   . Peripheral vascular disease ([place])   . 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 Spider veins    Past Surgical History:  Procedure Laterality Date  . CARDIAC CATHETERIZATION     2010 Does not see a cardiac  doctor  . Colonsopy    . DENTAL SURGERY    . ECTOPIC PREGNANCY SURGERY    . EYE SURGERY Bilateral 2011,2012   cataracts  . FRACTURE SURGERY  2009   hip, rod from knee to hip  . HEMORRHOID SURGERY    . IVC filter Left    pt states it has turned side ways and could not be removed.  Marland Kitchen KYPHOPLASTY  09/03/2012   per patient, she has had kypho x 2:  Surgeon: Winfield Cunas, MD;  Location: [place] [facility];  Service: Neurosurgery;  Laterality: N/A;  Thoracic eight Kyphoplasty  . OPEN REDUCTION INTERNAL FIXATION (ORIF) DISTAL RADIAL FRACTURE Left 09/06/2015   Procedure: OPEN REDUCTION INTERNAL FIXATION (ORIF) DISTAL RADIAL FRACTURE;  Surgeon: Hessie Knows, MD;  Location: [facility];  Service: Orthopedics;  Laterality:  Left;  . RIGHT OOPHORECTOMY     partial-  . SKIN CANCER EXCISION     top of head  . TONSILLECTOMY     age 13  . VERTEBROPLASTY  12/31/2011   Procedure: VERTEBROPLASTY;  Surgeon: Winfield Cunas, MD;  Location: [place] [facility];  Service: Neurosurgery;  Laterality: N/A;  Thoracic eleven vertebroplasty   Family History  Problem Relation Age of Onset  . Heart disease Father        myocardial infarction  . Heart disease Brother        myocardial infarction  . Lymphoma Sister   . Anesthesia problems Neg Hx   . Breast cancer Neg Hx   . Colon cancer Neg Hx    Social History   Social History  . Marital status: Widowed    Spouse name: N/A  . Number of children: 0  . Years of education: N/A   Social History Main Topics  . Smoking status: Never Smoker  . Smokeless tobacco: Never Used  . Alcohol use No  . Drug use: No  . Sexual activity: No   Other Topics Concern  . None   Social History Narrative  . None  Outpatient Encounter Prescriptions as of 06/10/2017  Medication Sig  . acetaminophen (TYLENOL) 650 MG CR tablet Take 650 mg by mouth every 8 (eight) hours as needed for pain.   . bifidobacterium infantis (ALIGN) capsule Take 1 capsule by mouth daily.  . Calcium 600-200 MG-UNIT tablet Take 1 tablet by mouth 3 (three) times daily with meals.  . cholecalciferol (VITAMIN D) 1000 units tablet Take 1,000 Units by mouth daily.  Marland Kitchen docusate sodium (COLACE) 100 MG capsule Take 2 capsules (200 mg total) by mouth 2 (two) times daily.  . furosemide (LASIX) 20 MG tablet Take 1 tablet (20 mg total) by mouth 3 (three) times a week.  . Glucosamine-Chondroitin 250-200 MG TABS Take 1 tablet by mouth 2 (two) times daily.   Marland Kitchen omeprazole (PRILOSEC) 20 MG capsule TAKE 1 CAPSULE BY MOUTH ONCE DAILY  . phenylephrine (,USE FOR PREPARATION-H,) 0.25 % suppository Place 1 suppository rectally 2 (two) times daily.  . polyethylene glycol (MIRALAX / GLYCOLAX) packet Take 17 g by mouth daily as needed for  mild constipation.  . ramipril (ALTACE) 5 MG capsule TAKE 1 CAPSULE BY MOUTH ONCE DAILY  . sertraline (ZOLOFT) 50 MG tablet TAKE 1 TABLET BY MOUTH ONCE DAILY  . traMADol (ULTRAM) 50 MG tablet Take 1 tablet (50 mg total) by mouth every 6 (six) hours as needed for moderate pain.  Marland Kitchen nystatin cream (MYCOSTATIN) Apply 1 application topically 2 (two) times daily.   No facility-administered encounter medications on file as of 06/10/2017.     Review of Systems  Constitutional: Negative for appetite change and unexpected weight change.  HENT: Negative for congestion and sinus pressure.   Respiratory: Negative for chest tightness.        Breathing stable.    Cardiovascular: Negative for chest pain and palpitations.       Leg swelling improved.    Gastrointestinal: Negative for abdominal pain, diarrhea, nausea and vomiting.  Genitourinary: Negative for difficulty urinating and dysuria.  Musculoskeletal: Negative for joint swelling and myalgias.  Skin:       Stasis changes.    Neurological: Negative for dizziness, light-headedness and headaches.  Psychiatric/Behavioral: Negative for agitation and dysphoric mood.       Objective:    Physical Exam  Constitutional: She appears well-developed and well-nourished. No distress.  HENT:  Nose: Nose normal.  Mouth/Throat: Oropharynx is clear and moist.  Neck: Neck supple. No thyromegaly present.  Cardiovascular: Normal rate and regular rhythm.   Pulmonary/Chest: Breath sounds normal. No respiratory distress. She has no wheezes.  Abdominal: Soft. Bowel sounds are normal. There is no tenderness.  Musculoskeletal: She exhibits no edema or tenderness.  Lymphadenopathy:    She has no cervical adenopathy.  Skin:  Venostasis changes.  Erythematous rash under both breast which appears to be c/w yeast.   Psychiatric: She has a normal mood and affect. Her behavior is normal.    BP 140/64 (BP Location: Left Arm, Patient Position: Sitting, Cuff Size:  Normal)   Pulse 82   Temp 98 F (36.7 C) (Oral)   Resp 14   Wt 119 lb (54 kg)   BMI 19.80 kg/m  Wt Readings from Last 3 Encounters:  06/10/17 119 lb (54 kg)  02/04/17 126 lb 9.6 oz (57.4 kg)  12/30/16 127 lb (57.6 kg)     Lab Results  Component Value Date   WBC 8.0 03/25/2017   HGB 10.9 (L) 03/25/2017   HCT 33.5 (L) 03/25/2017   PLT 267.0 03/25/2017   GLUCOSE  96 03/25/2017   CHOL 229 (H) 07/12/2012   TRIG 87.0 07/12/2012   HDL 86.30 07/12/2012   LDLDIRECT 135.9 07/12/2012   ALT 11 03/25/2017   AST 15 03/25/2017   NA 132 (L) 03/25/2017   K 4.3 03/25/2017   CL 100 03/25/2017   CREATININE 0.97 03/25/2017   BUN 19 03/25/2017   CO2 27 03/25/2017   TSH 1.61 06/11/2016   INR 1.00 01/24/2015    Dg Ribs Unilateral W/chest Left  Result Date: 02/03/2017 CLINICAL DATA:  83 y/o F; status post fall with left-sided rib pain. EXAM: LEFT RIBS AND CHEST - 3+ VIEW COMPARISON:  None. FINDINGS: Mildly displaced fractures of the left sixth and eighth ribs. No additional appreciable rib fracture identified. IMPRESSION: Mildly displaced fractures of the left sixth and eighth ribs. No additional appreciable rib fracture identified. Electronically Signed   By: Kristine Garbe M.D.   On: 02/03/2017 20:56   Dg Scapula Left  Result Date: 02/03/2017 CLINICAL DATA:  Tripped over garden hose and fell on left side. Left shoulder pain. Concern for scapular injury. Initial encounter. EXAM: LEFT SCAPULA - 2+ VIEWS COMPARISON:  Left shoulder radiographs performed earlier today at 8:16 p.m. FINDINGS: The left scapula appears grossly intact. The left humeral head remains seated at the glenoid fossa. Mild degenerative change is noted at the left acromioclavicular joint. The previously noted mildly displaced left lateral eighth rib fracture is not imaged on this study. IMPRESSION: No evidence of fracture or dislocation. Left scapula appears intact. Electronically Signed   By: Garald Balding M.D.   On:  02/03/2017 22:13   Dg Ankle Complete Left  Result Date: 02/03/2017 CLINICAL DATA:  Tripped over garden hose and fell on left side. Left ankle pain. Initial encounter. EXAM: LEFT ANKLE COMPLETE - 3+ VIEW COMPARISON:  Left tibia/fibula radiographs performed 01/12/2015 FINDINGS: There is no evidence of fracture or dislocation. The ankle mortise is intact; the interosseous space is within normal limits. No talar tilt or subluxation is seen. The joint spaces are preserved. Mild medial soft tissue swelling is noted. Diffuse vascular calcifications are seen. IMPRESSION: 1. No evidence of fracture or dislocation. 2. Diffuse vascular calcifications seen. Electronically Signed   By: Garald Balding M.D.   On: 02/03/2017 22:18   Ct Head Wo Contrast  Result Date: 02/03/2017 CLINICAL DATA:  Tripped over a garden hose and fell onto LEFT side, complaining of LEFT-sided shoulder pain, bruising to LEFT side of face, history hypertension EXAM: CT HEAD WITHOUT CONTRAST CT CERVICAL SPINE WITHOUT CONTRAST TECHNIQUE: Multidetector CT imaging of the head and cervical spine was performed following the standard protocol without intravenous contrast. Multiplanar CT image reconstructions of the cervical spine were also generated. COMPARISON:  CT head 09/02/2015 FINDINGS: CT HEAD FINDINGS Brain: Generalized atrophy. Normal ventricular morphology. No midline shift or mass effect. Small vessel chronic ischemic changes of deep cerebral white matter. No intracranial hemorrhage, mass lesion, evidence of acute infarction, or extra-axial fluid collection. Vascular: Atherosclerotic calcifications of the internal carotid and vertebral arteries at skullbase. Skull: Intact Sinuses/Orbits: Chronic mucosal thickening and partial opacity of the LEFT maxillary sinus with osseous wall thickening. Remaining paranasal sinuses and mastoid air cells clear Other: N/A CT CERVICAL SPINE FINDINGS Alignment: 3.3 mm of anterolisthesis at C3-C4. Less than 2 mm of  retrolisthesis at C4-C5, C5-C6, and C6-C7. Skull base and vertebrae: Visualized skullbase intact. Vertebral body heights maintained. No fracture or bone destruction. Mild scattered facet degenerative changes cervical spine. Soft tissues and spinal canal: Prevertebral soft  tissues normal thickness. Atherosclerotic calcifications at the carotid bifurcations bilaterally and at the great vessels in the lower cervical region. Disc levels: Multilevel disc space narrowing and scattered endplate spurs. Scattered bony neural foraminal narrowing. Upper chest: Biapical lung scarring. Other: Degenerative changes BILATERAL temporomandibular joints. IMPRESSION: Atrophy with small-vessel chronic ischemic changes of deep cerebral white matter. No acute intracranial abnormalities. Chronic LEFT maxillary sinus disease. Osseous demineralization with degenerative disc and facet disease changes of the cervical spine associated with 3.3 mm of anterolisthesis at C3-C4 and mild degrees of retrolisthesis at C4-C5 through C6-C7. No acute fractures identified. Electronically Signed   By: Lavonia Dana M.D.   On: 02/03/2017 20:19   Ct Cervical Spine Wo Contrast  Result Date: 02/03/2017 CLINICAL DATA:  Tripped over a garden hose and fell onto LEFT side, complaining of LEFT-sided shoulder pain, bruising to LEFT side of face, history hypertension EXAM: CT HEAD WITHOUT CONTRAST CT CERVICAL SPINE WITHOUT CONTRAST TECHNIQUE: Multidetector CT imaging of the head and cervical spine was performed following the standard protocol without intravenous contrast. Multiplanar CT image reconstructions of the cervical spine were also generated. COMPARISON:  CT head 09/02/2015 FINDINGS: CT HEAD FINDINGS Brain: Generalized atrophy. Normal ventricular morphology. No midline shift or mass effect. Small vessel chronic ischemic changes of deep cerebral white matter. No intracranial hemorrhage, mass lesion, evidence of acute infarction, or extra-axial fluid  collection. Vascular: Atherosclerotic calcifications of the internal carotid and vertebral arteries at skullbase. Skull: Intact Sinuses/Orbits: Chronic mucosal thickening and partial opacity of the LEFT maxillary sinus with osseous wall thickening. Remaining paranasal sinuses and mastoid air cells clear Other: N/A CT CERVICAL SPINE FINDINGS Alignment: 3.3 mm of anterolisthesis at C3-C4. Less than 2 mm of retrolisthesis at C4-C5, C5-C6, and C6-C7. Skull base and vertebrae: Visualized skullbase intact. Vertebral body heights maintained. No fracture or bone destruction. Mild scattered facet degenerative changes cervical spine. Soft tissues and spinal canal: Prevertebral soft tissues normal thickness. Atherosclerotic calcifications at the carotid bifurcations bilaterally and at the great vessels in the lower cervical region. Disc levels: Multilevel disc space narrowing and scattered endplate spurs. Scattered bony neural foraminal narrowing. Upper chest: Biapical lung scarring. Other: Degenerative changes BILATERAL temporomandibular joints. IMPRESSION: Atrophy with small-vessel chronic ischemic changes of deep cerebral white matter. No acute intracranial abnormalities. Chronic LEFT maxillary sinus disease. Osseous demineralization with degenerative disc and facet disease changes of the cervical spine associated with 3.3 mm of anterolisthesis at C3-C4 and mild degrees of retrolisthesis at C4-C5 through C6-C7. No acute fractures identified. Electronically Signed   By: Lavonia Dana M.D.   On: 02/03/2017 20:19   Dg Shoulder Left  Result Date: 02/03/2017 CLINICAL DATA:  83 year old female with left shoulder pain. EXAM: LEFT SHOULDER - 2+ VIEW COMPARISON:  Left rib radiograph dated 02/03/2017 and the left ovary radiograph dated 02/03/2017 FINDINGS: There is no acute fracture of the left shoulder. No dislocation. There are chronic degenerative changes of the left humeral head. The bones are osteopenic. Partially visualized  minimally displaced left lateral rib fracture. This is better evaluated on the rib series radiograph. Multilevel thoracic compression deformities and vertebroplasty changes. Stable cardiac silhouette. IMPRESSION: 1. No acute fracture or dislocation of the left shoulder. 2. Minimally displaced fracture of the lateral aspect of a left rib. Electronically Signed   By: Anner Crete M.D.   On: 02/03/2017 20:58       Assessment & Plan:   Problem List Items Addressed This Visit    Anemia    hgb stable -  10.9 last check.  Follow cbc.        Bilateral lower extremity edema    Swelling of lower extremities improved.  Follow.        Hypercholesterolemia    Followed lipid panel.        Hypertension    Blood pressure stable.  Continue same medication regimen.  Follow pressures.  Follow metabolic panel.        Hyponatremia    Last sodium stable.  Follow.        Stasis ulcer ([place])    Previous ulcer.  Skin improved.  Swelling improved.        Weight loss    Eating better.  No nausea or vomiting.  Follow.         Other Visit Diagnoses    Rash    -  Primary   C/W yeast.  treat with nystatin.  keep area dry.         [name], MD

## 2017-06-12 ENCOUNTER — Ambulatory Visit (INDEPENDENT_AMBULATORY_CARE_PROVIDER_SITE_OTHER): Payer: Self-pay | Admitting: Vascular Surgery

## 2017-06-13 ENCOUNTER — Encounter: Payer: Self-pay | Admitting: Internal Medicine

## 2017-06-13 NOTE — Assessment & Plan Note (Signed)
Followed lipid panel.   

## 2017-06-13 NOTE — Assessment & Plan Note (Signed)
Last sodium stable.  Follow.

## 2017-06-13 NOTE — Assessment & Plan Note (Signed)
Swelling of lower extremities improved.  Follow.

## 2017-06-13 NOTE — Assessment & Plan Note (Signed)
Eating better.  No nausea or vomiting.  Follow.

## 2017-06-13 NOTE — Assessment & Plan Note (Signed)
hgb stable - 10.9 last check.  Follow cbc.

## 2017-06-13 NOTE — Assessment & Plan Note (Signed)
Previous ulcer.  Skin improved.  Swelling improved.

## 2017-06-13 NOTE — Assessment & Plan Note (Signed)
Blood pressure stable.  Continue same medication regimen.  Follow pressures.  Follow metabolic panel.

## 2017-07-05 ENCOUNTER — Other Ambulatory Visit: Payer: Self-pay | Admitting: Internal Medicine

## 2017-09-08 ENCOUNTER — Telehealth: Payer: Self-pay | Admitting: Internal Medicine

## 2017-09-09 ENCOUNTER — Other Ambulatory Visit: Payer: Self-pay | Admitting: Internal Medicine

## 2017-09-14 ENCOUNTER — Encounter: Payer: Self-pay | Admitting: Internal Medicine

## 2017-09-14 ENCOUNTER — Ambulatory Visit (INDEPENDENT_AMBULATORY_CARE_PROVIDER_SITE_OTHER): Payer: Medicare Other | Admitting: Internal Medicine

## 2017-09-14 VITALS — BP 112/60 | HR 70 | Temp 97.7°F | Resp 16 | Wt 124.0 lb

## 2017-09-14 DIAGNOSIS — N289 Disorder of kidney and ureter, unspecified: Secondary | ICD-10-CM | POA: Diagnosis not present

## 2017-09-14 DIAGNOSIS — I10 Essential (primary) hypertension: Secondary | ICD-10-CM | POA: Diagnosis not present

## 2017-09-14 DIAGNOSIS — L989 Disorder of the skin and subcutaneous tissue, unspecified: Secondary | ICD-10-CM

## 2017-09-14 DIAGNOSIS — E7800 Pure hypercholesterolemia, unspecified: Secondary | ICD-10-CM

## 2017-09-14 DIAGNOSIS — I872 Venous insufficiency (chronic) (peripheral): Secondary | ICD-10-CM

## 2017-09-14 DIAGNOSIS — M8000XD Age-related osteoporosis with current pathological fracture, unspecified site, subsequent encounter for fracture with routine healing: Secondary | ICD-10-CM | POA: Diagnosis not present

## 2017-09-14 DIAGNOSIS — K219 Gastro-esophageal reflux disease without esophagitis: Secondary | ICD-10-CM

## 2017-09-14 DIAGNOSIS — L97209 Non-pressure chronic ulcer of unspecified calf with unspecified severity: Secondary | ICD-10-CM

## 2017-09-14 DIAGNOSIS — R600 Localized edema: Secondary | ICD-10-CM | POA: Diagnosis not present

## 2017-09-14 DIAGNOSIS — D649 Anemia, unspecified: Secondary | ICD-10-CM

## 2017-09-14 LAB — CBC WITH DIFFERENTIAL/PLATELET
BASOS ABS: 0 10*3/uL (ref 0.0–0.1)
BASOS PCT: 0.5 % (ref 0.0–3.0)
EOS ABS: 0.1 10*3/uL (ref 0.0–0.7)
Eosinophils Relative: 2.9 % (ref 0.0–5.0)
HEMATOCRIT: 33.1 % — AB (ref 36.0–46.0)
HEMOGLOBIN: 11.2 g/dL — AB (ref 12.0–15.0)
LYMPHS PCT: 27.5 % (ref 12.0–46.0)
Lymphs Abs: 1.3 10*3/uL (ref 0.7–4.0)
MCHC: 33.9 g/dL (ref 30.0–36.0)
MCV: 99.8 fl (ref 78.0–100.0)
MONOS PCT: 10.8 % (ref 3.0–12.0)
Monocytes Absolute: 0.5 10*3/uL (ref 0.1–1.0)
Neutro Abs: 2.9 10*3/uL (ref 1.4–7.7)
Neutrophils Relative %: 58.3 % (ref 43.0–77.0)
Platelets: 159 10*3/uL (ref 150.0–400.0)
RBC: 3.32 Mil/uL — ABNORMAL LOW (ref 3.87–5.11)
RDW: 14.1 % (ref 11.5–15.5)
WBC: 4.9 10*3/uL (ref 4.0–10.5)

## 2017-09-14 LAB — HEPATIC FUNCTION PANEL
ALT: 14 U/L (ref 0–35)
AST: 23 U/L (ref 0–37)
Albumin: 4.3 g/dL (ref 3.5–5.2)
Alkaline Phosphatase: 55 U/L (ref 39–117)
BILIRUBIN DIRECT: 0.1 mg/dL (ref 0.0–0.3)
BILIRUBIN TOTAL: 0.5 mg/dL (ref 0.2–1.2)
Total Protein: 7 g/dL (ref 6.0–8.3)

## 2017-09-14 LAB — VITAMIN D 25 HYDROXY (VIT D DEFICIENCY, FRACTURES): VITD: 63.56 ng/mL (ref 30.00–100.00)

## 2017-09-14 LAB — BASIC METABOLIC PANEL
BUN: 33 mg/dL — ABNORMAL HIGH (ref 6–23)
CALCIUM: 9.1 mg/dL (ref 8.4–10.5)
CO2: 27 meq/L (ref 19–32)
CREATININE: 1.47 mg/dL — AB (ref 0.40–1.20)
Chloride: 101 mEq/L (ref 96–112)
GFR: 35.16 mL/min — ABNORMAL LOW (ref >60.00)
GLUCOSE: 94 mg/dL (ref 70–99)
Potassium: 4.3 mEq/L (ref 3.5–5.1)
Sodium: 136 mEq/L (ref 135–145)

## 2017-09-14 LAB — FERRITIN: Ferritin: 115.8 ng/mL (ref 10.0–291.0)

## 2017-09-14 LAB — TSH: TSH: 2.2 u[IU]/mL (ref 0.35–4.50)

## 2017-09-14 NOTE — Patient Instructions (Signed)
telfa -  pads

## 2017-09-14 NOTE — Progress Notes (Signed)
Patient ID: Kayla Galloway, female   DOB: [birth year], 84 y.o.   MRN: [id number]   Subjective:    Patient ID: Kayla Galloway, female    DOB: 1920-09-21, 84 y.o.   MRN: [id number]  HPI  Patient here for a scheduled follow up.  She is accompanied by her sister and her nephew.  History obtained from all of them.  She is doing relatively well.  Eating well.  Discussed importance of staying hydrated.  No chest pain.  Breathing stable.  Able to get around ok.  She does have skin tear and lesion - left buttock/hip.  She ripped a bandage off.  They have been treating with triple abx cream and covering.  States is getting better.  Also has a few small open lesions lower legs.  She has been picking.  No increased redness.  Swelling better.  No abdominal pain.  Bowels moving.  No urine problems reported.     Past Medical History:  Diagnosis Date  . Anemia   . Arthritis   . Cancer ([place])    skin ca lesion of scalp- removed  . Depression   . DVT (deep venous thrombosis), left    bilateral, IVC filter 2010  . GERD (gastroesophageal reflux disease)   . Gout   . Hypercholesterolemia   . Hyperkalemia   . Hypertension    Dr. [name]  . Nephrolithiasis   . Obesity   . Osteoporosis   . Peripheral vascular disease ([place])   . 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 Spider veins    Past Surgical History:  Procedure Laterality Date  . CARDIAC CATHETERIZATION     2010 Does not see a cardiac  doctor  . Colonsopy    . DENTAL SURGERY    . ECTOPIC PREGNANCY SURGERY    . EYE SURGERY Bilateral 2011,2012   cataracts  . FRACTURE SURGERY  2009   hip, rod from knee to hip  . HEMORRHOID SURGERY    . IVC filter Left    pt states it has turned side ways and could not be removed.  Marland Kitchen KYPHOPLASTY  09/03/2012   per patient, she has had kypho x 2:  Surgeon: Winfield Cunas, MD;  Location: [facility];   Service: Neurosurgery;  Laterality: N/A;  Thoracic eight Kyphoplasty  . OPEN REDUCTION INTERNAL FIXATION (ORIF) DISTAL RADIAL FRACTURE Left 09/06/2015   Procedure: OPEN REDUCTION INTERNAL FIXATION (ORIF) DISTAL RADIAL FRACTURE;  Surgeon: Hessie Knows, MD;  Location: [facility];  Service: Orthopedics;  Laterality: Left;  . RIGHT OOPHORECTOMY     partial-  . SKIN CANCER EXCISION     top of head  . TONSILLECTOMY     age 63  . VERTEBROPLASTY  12/31/2011   Procedure: VERTEBROPLASTY;  Surgeon: Winfield Cunas, MD;  Location: [facility];  Service: Neurosurgery;  Laterality: N/A;  Thoracic eleven vertebroplasty   Family History  Problem Relation Age of Onset  . Heart disease Father        myocardial infarction  . Heart disease Brother        myocardial infarction  . Lymphoma Sister   . Anesthesia problems Neg Hx   . Breast cancer Neg Hx   . Colon cancer Neg Hx    Social History   Socioeconomic History  . Marital status: Widowed    Spouse name: None  .  Number of children: 0  . Years of education: None  . Highest education level: None  Social Needs  . Financial resource strain: None  . Food insecurity - worry: None  . Food insecurity - inability: None  . Transportation needs - medical: None  . Transportation needs - non-medical: None  Occupational History  . None  Tobacco Use  . Smoking status: Never Smoker  . Smokeless tobacco: Never Used  Substance and Sexual Activity  . Alcohol use: No    Alcohol/week: 0.0 oz  . Drug use: No  . Sexual activity: No  Other Topics Concern  . None  Social History Narrative  . None    Outpatient Encounter Medications as of 09/14/2017  Medication Sig  . acetaminophen (TYLENOL) 650 MG CR tablet Take 650 mg by mouth every 8 (eight) hours as needed for pain.   . bifidobacterium infantis (ALIGN) capsule Take 1 capsule by mouth daily.  . Calcium 600-200 MG-UNIT tablet Take 1 tablet by mouth 3 (three) times daily with meals.  . cholecalciferol  (VITAMIN D) 1000 units tablet Take 1,000 Units by mouth daily.  Marland Kitchen docusate sodium (COLACE) 100 MG capsule Take 2 capsules (200 mg total) by mouth 2 (two) times daily.  . furosemide (LASIX) 20 MG tablet Take 1 tablet (20 mg total) by mouth 3 (three) times a week.  . Glucosamine-Chondroitin 250-200 MG TABS Take 1 tablet by mouth 2 (two) times daily.   . mupirocin ointment (BACTROBAN) 2 % Apply 1 application topically 2 (two) times daily.  Marland Kitchen nystatin cream (MYCOSTATIN) Apply 1 application topically 2 (two) times daily.  Marland Kitchen omeprazole (PRILOSEC) 20 MG capsule TAKE 1 CAPSULE BY MOUTH ONCE DAILY  . phenylephrine (,USE FOR PREPARATION-H,) 0.25 % suppository Place 1 suppository rectally 2 (two) times daily.  . polyethylene glycol (MIRALAX / GLYCOLAX) packet Take 17 g by mouth daily as needed for mild constipation.  . ramipril (ALTACE) 5 MG capsule TAKE 1 CAPSULE BY MOUTH ONCE DAILY  . sertraline (ZOLOFT) 50 MG tablet TAKE 1 TABLET BY MOUTH ONCE DAILY  . traMADol (ULTRAM) 50 MG tablet Take 1 tablet (50 mg total) by mouth every 6 (six) hours as needed for moderate pain.  Marland Kitchen triamcinolone cream (KENALOG) 0.5 %    No facility-administered encounter medications on file as of 09/14/2017.     Review of Systems  Constitutional: Negative for appetite change and fever.  HENT: Negative for congestion and sinus pressure.   Respiratory: Negative for cough, chest tightness and shortness of breath.   Cardiovascular: Negative for chest pain and palpitations.       Leg swelling - improved.    Gastrointestinal: Negative for abdominal pain, diarrhea, nausea and vomiting.  Genitourinary: Negative for difficulty urinating and dysuria.  Musculoskeletal: Negative for joint swelling and myalgias.  Skin: Negative for rash.       Skin lesions and tear as outlined.    Neurological: Negative for dizziness, light-headedness and headaches.  Psychiatric/Behavioral: Negative for agitation and dysphoric mood.       Objective:      Physical Exam  Constitutional: She appears well-developed and well-nourished. No distress.  HENT:  Nose: Nose normal.  Mouth/Throat: Oropharynx is clear and moist.  Neck: Neck supple. No thyromegaly present.  Cardiovascular: Normal rate and regular rhythm.  Pulmonary/Chest: Breath sounds normal. No respiratory distress. She has no wheezes.  Abdominal: Soft. Bowel sounds are normal. There is no tenderness.  Musculoskeletal: She exhibits no tenderness.  Some pedal and lower extremity swelling -  improved.  One open lesion on right leg and two small open lesions on lower left leg.  No surrounding erythema.  Lymphadenopathy:    She has no cervical adenopathy.  Skin: No rash noted. No erythema.  Psychiatric: She has a normal mood and affect. Her behavior is normal.    BP 112/60 (BP Location: Right Arm, Patient Position: Sitting, Cuff Size: Normal)   Pulse 70   Temp 97.7 F (36.5 C) (Oral)   Resp 16   Wt 124 lb (56.2 kg)   SpO2 98%   BMI 20.63 kg/m  Wt Readings from Last 3 Encounters:  09/14/17 124 lb (56.2 kg)  06/10/17 119 lb (54 kg)  02/04/17 126 lb 9.6 oz (57.4 kg)     Lab Results  Component Value Date   WBC 4.9 09/14/2017   HGB 11.2 (L) 09/14/2017   HCT 33.1 (L) 09/14/2017   PLT 159.0 09/14/2017   GLUCOSE 94 09/14/2017   CHOL 229 (H) 07/12/2012   TRIG 87.0 07/12/2012   HDL 86.30 07/12/2012   LDLDIRECT 135.9 07/12/2012   ALT 14 09/14/2017   AST 23 09/14/2017   NA 136 09/14/2017   K 4.3 09/14/2017   CL 101 09/14/2017   CREATININE 1.47 (H) 09/14/2017   BUN 33 (H) 09/14/2017   CO2 27 09/14/2017   TSH 2.20 09/14/2017   INR 1.00 01/24/2015    Dg Ribs Unilateral W/chest Left  Result Date: 02/03/2017 CLINICAL DATA:  84 y/o F; status post fall with left-sided rib pain. EXAM: LEFT RIBS AND CHEST - 3+ VIEW COMPARISON:  None. FINDINGS: Mildly displaced fractures of the left sixth and eighth ribs. No additional appreciable rib fracture identified. IMPRESSION: Mildly  displaced fractures of the left sixth and eighth ribs. No additional appreciable rib fracture identified. Electronically Signed   By: Kristine Garbe M.D.   On: 02/03/2017 20:56   Dg Scapula Left  Result Date: 02/03/2017 CLINICAL DATA:  Tripped over garden hose and fell on left side. Left shoulder pain. Concern for scapular injury. Initial encounter. EXAM: LEFT SCAPULA - 2+ VIEWS COMPARISON:  Left shoulder radiographs performed earlier today at 8:16 p.m. FINDINGS: The left scapula appears grossly intact. The left humeral head remains seated at the glenoid fossa. Mild degenerative change is noted at the left acromioclavicular joint. The previously noted mildly displaced left lateral eighth rib fracture is not imaged on this study. IMPRESSION: No evidence of fracture or dislocation. Left scapula appears intact. Electronically Signed   By: Garald Balding M.D.   On: 02/03/2017 22:13   Dg Ankle Complete Left  Result Date: 02/03/2017 CLINICAL DATA:  Tripped over garden hose and fell on left side. Left ankle pain. Initial encounter. EXAM: LEFT ANKLE COMPLETE - 3+ VIEW COMPARISON:  Left tibia/fibula radiographs performed 01/12/2015 FINDINGS: There is no evidence of fracture or dislocation. The ankle mortise is intact; the interosseous space is within normal limits. No talar tilt or subluxation is seen. The joint spaces are preserved. Mild medial soft tissue swelling is noted. Diffuse vascular calcifications are seen. IMPRESSION: 1. No evidence of fracture or dislocation. 2. Diffuse vascular calcifications seen. Electronically Signed   By: Garald Balding M.D.   On: 02/03/2017 22:18   Ct Head Wo Contrast  Result Date: 02/03/2017 CLINICAL DATA:  Tripped over a garden hose and fell onto LEFT side, complaining of LEFT-sided shoulder pain, bruising to LEFT side of face, history hypertension EXAM: CT HEAD WITHOUT CONTRAST CT CERVICAL SPINE WITHOUT CONTRAST TECHNIQUE: Multidetector CT imaging of the  head and  cervical spine was performed following the standard protocol without intravenous contrast. Multiplanar CT image reconstructions of the cervical spine were also generated. COMPARISON:  CT head 09/02/2015 FINDINGS: CT HEAD FINDINGS Brain: Generalized atrophy. Normal ventricular morphology. No midline shift or mass effect. Small vessel chronic ischemic changes of deep cerebral white matter. No intracranial hemorrhage, mass lesion, evidence of acute infarction, or extra-axial fluid collection. Vascular: Atherosclerotic calcifications of the internal carotid and vertebral arteries at skullbase. Skull: Intact Sinuses/Orbits: Chronic mucosal thickening and partial opacity of the LEFT maxillary sinus with osseous wall thickening. Remaining paranasal sinuses and mastoid air cells clear Other: N/A CT CERVICAL SPINE FINDINGS Alignment: 3.3 mm of anterolisthesis at C3-C4. Less than 2 mm of retrolisthesis at C4-C5, C5-C6, and C6-C7. Skull base and vertebrae: Visualized skullbase intact. Vertebral body heights maintained. No fracture or bone destruction. Mild scattered facet degenerative changes cervical spine. Soft tissues and spinal canal: Prevertebral soft tissues normal thickness. Atherosclerotic calcifications at the carotid bifurcations bilaterally and at the great vessels in the lower cervical region. Disc levels: Multilevel disc space narrowing and scattered endplate spurs. Scattered bony neural foraminal narrowing. Upper chest: Biapical lung scarring. Other: Degenerative changes BILATERAL temporomandibular joints. IMPRESSION: Atrophy with small-vessel chronic ischemic changes of deep cerebral white matter. No acute intracranial abnormalities. Chronic LEFT maxillary sinus disease. Osseous demineralization with degenerative disc and facet disease changes of the cervical spine associated with 3.3 mm of anterolisthesis at C3-C4 and mild degrees of retrolisthesis at C4-C5 through C6-C7. No acute fractures identified.  Electronically Signed   By: Lavonia Dana M.D.   On: 02/03/2017 20:19   Ct Cervical Spine Wo Contrast  Result Date: 02/03/2017 CLINICAL DATA:  Tripped over a garden hose and fell onto LEFT side, complaining of LEFT-sided shoulder pain, bruising to LEFT side of face, history hypertension EXAM: CT HEAD WITHOUT CONTRAST CT CERVICAL SPINE WITHOUT CONTRAST TECHNIQUE: Multidetector CT imaging of the head and cervical spine was performed following the standard protocol without intravenous contrast. Multiplanar CT image reconstructions of the cervical spine were also generated. COMPARISON:  CT head 09/02/2015 FINDINGS: CT HEAD FINDINGS Brain: Generalized atrophy. Normal ventricular morphology. No midline shift or mass effect. Small vessel chronic ischemic changes of deep cerebral white matter. No intracranial hemorrhage, mass lesion, evidence of acute infarction, or extra-axial fluid collection. Vascular: Atherosclerotic calcifications of the internal carotid and vertebral arteries at skullbase. Skull: Intact Sinuses/Orbits: Chronic mucosal thickening and partial opacity of the LEFT maxillary sinus with osseous wall thickening. Remaining paranasal sinuses and mastoid air cells clear Other: N/A CT CERVICAL SPINE FINDINGS Alignment: 3.3 mm of anterolisthesis at C3-C4. Less than 2 mm of retrolisthesis at C4-C5, C5-C6, and C6-C7. Skull base and vertebrae: Visualized skullbase intact. Vertebral body heights maintained. No fracture or bone destruction. Mild scattered facet degenerative changes cervical spine. Soft tissues and spinal canal: Prevertebral soft tissues normal thickness. Atherosclerotic calcifications at the carotid bifurcations bilaterally and at the great vessels in the lower cervical region. Disc levels: Multilevel disc space narrowing and scattered endplate spurs. Scattered bony neural foraminal narrowing. Upper chest: Biapical lung scarring. Other: Degenerative changes BILATERAL temporomandibular joints.  IMPRESSION: Atrophy with small-vessel chronic ischemic changes of deep cerebral white matter. No acute intracranial abnormalities. Chronic LEFT maxillary sinus disease. Osseous demineralization with degenerative disc and facet disease changes of the cervical spine associated with 3.3 mm of anterolisthesis at C3-C4 and mild degrees of retrolisthesis at C4-C5 through C6-C7. No acute fractures identified. Electronically Signed   By: Lavonia Dana  M.D.   On: 02/03/2017 20:19   Dg Shoulder Left  Result Date: 02/03/2017 CLINICAL DATA:  84 year old female with left shoulder pain. EXAM: LEFT SHOULDER - 2+ VIEW COMPARISON:  Left rib radiograph dated 02/03/2017 and the left ovary radiograph dated 02/03/2017 FINDINGS: There is no acute fracture of the left shoulder. No dislocation. There are chronic degenerative changes of the left humeral head. The bones are osteopenic. Partially visualized minimally displaced left lateral rib fracture. This is better evaluated on the rib series radiograph. Multilevel thoracic compression deformities and vertebroplasty changes. Stable cardiac silhouette. IMPRESSION: 1. No acute fracture or dislocation of the left shoulder. 2. Minimally displaced fracture of the lateral aspect of a left rib. Electronically Signed   By: Anner Crete M.D.   On: 02/03/2017 20:58       Assessment & Plan:   Problem List Items Addressed This Visit    Anemia - Primary    hgb has bee stable.  Recheck cbc.       Relevant Orders   CBC with Differential/Platelet (Completed)   Ferritin (Completed)   Bilateral lower extremity edema    Improved.  Follow.       GERD (gastroesophageal reflux disease)    Controlled on omeprazole.        Hypercholesterolemia    Follow lipid panel.       Relevant Orders   Hepatic function panel (Completed)   Hypertension    Blood pressure under good control.  Continue same medication regimen.  Follow pressures.  Follow metabolic panel.        Relevant Orders    TSH (Completed)   Basic metabolic panel (Completed)   Osteoporosis    Has declined further testing and medication.  Check vitamin D level.        Relevant Orders   VITAMIN D 25 Hydroxy (Vit-D Deficiency, Fractures) (Completed)   Renal insufficiency    Stay hydrated.  Recheck metabolic panel.        Skin lesions    Few open lesions on her lower extremities.  No surrounding erythema.  Discussed the need to stop picking the area.  Continue treatment as they are doing.  bactroban if needed.  Follow.  Also has skin tear/lesion - left buttock/hip.  No surrounding erythema.  Continue treatment as they are doing.  Improving. Follow closely.       Stasis ulcer ([place])    Small open lesions on lower legs as outlined.  Continue current treatment.  Swelling improved.  Follow.            [name], MD

## 2017-09-15 ENCOUNTER — Encounter: Payer: Self-pay | Admitting: Internal Medicine

## 2017-09-15 MED ORDER — MUPIROCIN 2 % EX OINT: 1.0000 "application " | TOPICAL_OINTMENT | Freq: Two times a day (BID) | CUTANEOUS | 0 refills | Status: DC

## 2017-09-15 NOTE — Assessment & Plan Note (Signed)
Blood pressure under good control.  Continue same medication regimen.  Follow pressures.  Follow metabolic panel.   

## 2017-09-15 NOTE — Assessment & Plan Note (Signed)
hgb has bee stable.  Recheck cbc.

## 2017-09-15 NOTE — Assessment & Plan Note (Signed)
Few open lesions on her lower extremities.  No surrounding erythema.  Discussed the need to stop picking the area.  Continue treatment as they are doing.  bactroban if needed.  Follow.  Also has skin tear/lesion - left buttock/hip.  No surrounding erythema.  Continue treatment as they are doing.  Improving. Follow closely.

## 2017-09-15 NOTE — Assessment & Plan Note (Signed)
Improved.  Follow.  

## 2017-09-15 NOTE — Assessment & Plan Note (Signed)
Stay hydrated.  Recheck metabolic panel.

## 2017-09-15 NOTE — Assessment & Plan Note (Signed)
Follow lipid panel.   

## 2017-09-15 NOTE — Assessment & Plan Note (Signed)
Has declined further testing and medication.  Check vitamin D level.

## 2017-09-15 NOTE — Assessment & Plan Note (Signed)
Small open lesions on lower legs as outlined.  Continue current treatment.  Swelling improved.  Follow.

## 2017-09-15 NOTE — Assessment & Plan Note (Signed)
Controlled on omeprazole.   

## 2017-09-16 ENCOUNTER — Other Ambulatory Visit: Payer: Self-pay | Admitting: Internal Medicine

## 2017-09-16 DIAGNOSIS — R7989 Other specified abnormal findings of blood chemistry: Secondary | ICD-10-CM

## 2017-09-16 NOTE — Progress Notes (Signed)
Order placed for f/u met b 

## 2017-09-24 ENCOUNTER — Other Ambulatory Visit (INDEPENDENT_AMBULATORY_CARE_PROVIDER_SITE_OTHER): Payer: Medicare Other

## 2017-09-24 ENCOUNTER — Encounter: Payer: Self-pay | Admitting: Internal Medicine

## 2017-09-24 ENCOUNTER — Ambulatory Visit (INDEPENDENT_AMBULATORY_CARE_PROVIDER_SITE_OTHER): Payer: Medicare Other | Admitting: Internal Medicine

## 2017-09-24 VITALS — BP 148/78 | HR 74 | Temp 98.2°F | Resp 16 | Wt 123.2 lb

## 2017-09-24 DIAGNOSIS — N949 Unspecified condition associated with female genital organs and menstrual cycle: Secondary | ICD-10-CM | POA: Diagnosis not present

## 2017-09-24 DIAGNOSIS — R600 Localized edema: Secondary | ICD-10-CM

## 2017-09-24 DIAGNOSIS — R7989 Other specified abnormal findings of blood chemistry: Secondary | ICD-10-CM | POA: Diagnosis not present

## 2017-09-24 DIAGNOSIS — I10 Essential (primary) hypertension: Secondary | ICD-10-CM | POA: Diagnosis not present

## 2017-09-24 DIAGNOSIS — R300 Dysuria: Secondary | ICD-10-CM | POA: Diagnosis not present

## 2017-09-24 LAB — BASIC METABOLIC PANEL
BUN: 30 mg/dL — ABNORMAL HIGH (ref 6–23)
CALCIUM: 9 mg/dL (ref 8.4–10.5)
CHLORIDE: 104 meq/L (ref 96–112)
CO2: 26 meq/L (ref 19–32)
Creatinine, Ser: 1.3 mg/dL — ABNORMAL HIGH (ref 0.40–1.20)
GFR: 40.51 mL/min — ABNORMAL LOW (ref >60.00)
GLUCOSE: 107 mg/dL — AB (ref 70–99)
Potassium: 4.6 mEq/L (ref 3.5–5.1)
Sodium: 136 mEq/L (ref 135–145)

## 2017-09-24 LAB — POCT URINALYSIS DIP (MANUAL ENTRY)
BILIRUBIN UA: NEGATIVE
GLUCOSE UA: NEGATIVE mg/dL
Ketones, POC UA: NEGATIVE mg/dL
Leukocytes, UA: NEGATIVE
Nitrite, UA: NEGATIVE
Protein Ur, POC: NEGATIVE mg/dL
RBC UA: NEGATIVE
Spec Grav, UA: 1.015 (ref 1.010–1.025)
UROBILINOGEN UA: 0.2 U/dL
pH, UA: 5.5 (ref 5.0–8.0)

## 2017-09-24 MED ORDER — NYSTATIN 100000 UNIT/GM EX CREA: 1.0000 "application " | TOPICAL_CREAM | Freq: Two times a day (BID) | CUTANEOUS | 1 refills | Status: DC

## 2017-09-24 NOTE — Progress Notes (Signed)
Patient ID: Kayla Galloway, female   DOB: May 18, 1921, 84 y.o.   MRN: [id number]   Subjective:    Patient ID: Kayla Galloway, female    DOB: 04/15/1921, 84 y.o.   MRN: [id number]  HPI  Patient here as a work in appt with concerns regarding some burning with urination.  States symptoms started recently.  Describes noticing some burning when she urinates.  Described more as noticing some burning when the urine touches the vaginal area.  No hematuria.  No vaginal discharge.  No abdominal pain. Eating and drinking.  States has been drinking cranberry juice.  No increased urinary frequency.  No fever.  No vomiting or nausea.    Past Medical History:  Diagnosis Date  . Anemia   . Arthritis   . Cancer ([place])    skin ca lesion of scalp- removed  . Depression   . DVT (deep venous thrombosis), left    bilateral, IVC filter 2010  . GERD (gastroesophageal reflux disease)   . Gout   . Hypercholesterolemia   . Hyperkalemia   . Hypertension    Dr. [name]  . Nephrolithiasis   . Obesity   . Osteoporosis   . Peripheral vascular disease ([place])   . 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 Spider veins    Past Surgical History:  Procedure Laterality Date  . CARDIAC CATHETERIZATION     2010 Does not see a cardiac  doctor  . Colonsopy    . DENTAL SURGERY    . ECTOPIC PREGNANCY SURGERY    . EYE SURGERY Bilateral 2011,2012   cataracts  . FRACTURE SURGERY  2009   hip, rod from knee to hip  . HEMORRHOID SURGERY    . IVC filter Left    pt states it has turned side ways and could not be removed.  Marland Kitchen KYPHOPLASTY  09/03/2012   per patient, she has had kypho x 2:  Surgeon: Winfield Cunas, MD;  Location: [place];  Service: Neurosurgery;  Laterality: N/A;  Thoracic eight Kyphoplasty  . OPEN REDUCTION INTERNAL FIXATION (ORIF) DISTAL RADIAL FRACTURE Left 09/06/2015   Procedure: OPEN REDUCTION  INTERNAL FIXATION (ORIF) DISTAL RADIAL FRACTURE;  Surgeon: Hessie Knows, MD;  Location: [facility];  Service: Orthopedics;  Laterality: Left;  . RIGHT OOPHORECTOMY     partial-  . SKIN CANCER EXCISION     top of head  . TONSILLECTOMY     age 67  . VERTEBROPLASTY  12/31/2011   Procedure: VERTEBROPLASTY;  Surgeon: Winfield Cunas, MD;  Location: [place] [facility];  Service: Neurosurgery;  Laterality: N/A;  Thoracic eleven vertebroplasty   Family History  Problem Relation Age of Onset  . Heart disease Father        myocardial infarction  . Heart disease Brother        myocardial infarction  . Lymphoma Sister   . Anesthesia problems Neg Hx   . Breast cancer Neg Hx   . Colon cancer Neg Hx    Social History   Socioeconomic History  . Marital status: Widowed    Spouse name: None  . Number of children: 0  . Years of education: None  . Highest education level: None  Social Needs  . Financial resource strain: None  . Food insecurity - worry: None  . Food insecurity - inability: None  . Transportation needs -  medical: None  . Transportation needs - non-medical: None  Occupational History  . None  Tobacco Use  . Smoking status: Never Smoker  . Smokeless tobacco: Never Used  Substance and Sexual Activity  . Alcohol use: No    Alcohol/week: 0.0 oz  . Drug use: No  . Sexual activity: No  Other Topics Concern  . None  Social History Narrative  . None    Outpatient Encounter Medications as of 09/24/2017  Medication Sig  . acetaminophen (TYLENOL) 650 MG CR tablet Take 650 mg by mouth every 8 (eight) hours as needed for pain.   . bifidobacterium infantis (ALIGN) capsule Take 1 capsule by mouth daily.  . Calcium 600-200 MG-UNIT tablet Take 1 tablet by mouth 3 (three) times daily with meals.  . cholecalciferol (VITAMIN D) 1000 units tablet Take 1,000 Units by mouth daily.  Marland Kitchen docusate sodium (COLACE) 100 MG capsule Take 2 capsules (200 mg total) by mouth 2 (two) times daily.  . furosemide  (LASIX) 20 MG tablet Take 1 tablet (20 mg total) by mouth 3 (three) times a week.  . Glucosamine-Chondroitin 250-200 MG TABS Take 1 tablet by mouth 2 (two) times daily.   . mupirocin ointment (BACTROBAN) 2 % Apply 1 application topically 2 (two) times daily.  Marland Kitchen nystatin cream (MYCOSTATIN) Apply 1 application topically 2 (two) times daily.  Marland Kitchen omeprazole (PRILOSEC) 20 MG capsule TAKE 1 CAPSULE BY MOUTH ONCE DAILY  . phenylephrine (,USE FOR PREPARATION-H,) 0.25 % suppository Place 1 suppository rectally 2 (two) times daily.  . polyethylene glycol (MIRALAX / GLYCOLAX) packet Take 17 g by mouth daily as needed for mild constipation.  . ramipril (ALTACE) 5 MG capsule TAKE 1 CAPSULE BY MOUTH ONCE DAILY  . sertraline (ZOLOFT) 50 MG tablet TAKE 1 TABLET BY MOUTH ONCE DAILY  . traMADol (ULTRAM) 50 MG tablet Take 1 tablet (50 mg total) by mouth every 6 (six) hours as needed for moderate pain.  Marland Kitchen triamcinolone cream (KENALOG) 0.5 %   . [DISCONTINUED] nystatin cream (MYCOSTATIN) Apply 1 application topically 2 (two) times daily.   No facility-administered encounter medications on file as of 09/24/2017.     Review of Systems  Constitutional: Negative for appetite change and fever.  Gastrointestinal: Negative for abdominal pain, diarrhea, nausea and vomiting.  Genitourinary:       Burning with urination.  Vaginal irritation.  No vaginal discharge.    Musculoskeletal: Negative for back pain and myalgias.  Skin: Negative for color change and rash.  Psychiatric/Behavioral: Negative for agitation and dysphoric mood.       Objective:    Physical Exam  Constitutional: She appears well-developed and well-nourished. No distress.  Neck: Neck supple.  Cardiovascular: Normal rate and regular rhythm.  Pulmonary/Chest: Breath sounds normal. No respiratory distress. She has no wheezes.  Abdominal: Soft. Bowel sounds are normal. There is no tenderness.  Genitourinary:  Genitourinary Comments: Normal external  genitalia - some minimal erythema.    Lymphadenopathy:    She has no cervical adenopathy.  Psychiatric: She has a normal mood and affect. Her behavior is normal.    BP (!) 148/78 (BP Location: Left Arm, Patient Position: Sitting, Cuff Size: Normal)   Pulse 74   Temp 98.2 F (36.8 C) (Oral)   Resp 16   Wt 123 lb 3.2 oz (55.9 kg)   SpO2 95%   BMI 20.50 kg/m  Wt Readings from Last 3 Encounters:  09/24/17 123 lb 3.2 oz (55.9 kg)  09/14/17 124 lb (56.2 kg)  06/10/17 119 lb (54 kg)     Lab Results  Component Value Date   WBC 4.9 09/14/2017   HGB 11.2 (L) 09/14/2017   HCT 33.1 (L) 09/14/2017   PLT 159.0 09/14/2017   GLUCOSE 107 (H) 09/24/2017   CHOL 229 (H) 07/12/2012   TRIG 87.0 07/12/2012   HDL 86.30 07/12/2012   LDLDIRECT 135.9 07/12/2012   ALT 14 09/14/2017   AST 23 09/14/2017   NA 136 09/24/2017   K 4.6 09/24/2017   CL 104 09/24/2017   CREATININE 1.30 (H) 09/24/2017   BUN 30 (H) 09/24/2017   CO2 26 09/24/2017   TSH 2.20 09/14/2017   INR 1.00 01/24/2015       Assessment & Plan:   Problem List Items Addressed This Visit    Bilateral lower extremity edema    Swelling improved.        Hypertension    Blood pressure has been under good control.  Slight elevation today.  Follow.  Hold on making changes.       Other Visit Diagnoses    Burning with urination    -  Primary   Relevant Orders   POCT urinalysis dipstick (Completed)   Urine Culture (Completed)   Urine Microscopic Only (Completed)   Vaginal burning       vaginal irritation as outlined.  will check urinalysis to confirm no infection.  nystatin cream as directed.  follow.        [name], MD

## 2017-09-25 ENCOUNTER — Encounter: Payer: Self-pay | Admitting: *Deleted

## 2017-09-25 LAB — URINE CULTURE
MICRO NUMBER:: 90166934
SPECIMEN QUALITY: ADEQUATE

## 2017-09-25 LAB — URINALYSIS, MICROSCOPIC ONLY

## 2017-09-27 ENCOUNTER — Encounter: Payer: Self-pay | Admitting: Internal Medicine

## 2017-09-27 NOTE — Assessment & Plan Note (Signed)
Swelling improved.   

## 2017-09-27 NOTE — Assessment & Plan Note (Signed)
Blood pressure has been under good control.  Slight elevation today.  Follow.  Hold on making changes.

## 2017-11-09 ENCOUNTER — Telehealth: Payer: Self-pay | Admitting: Internal Medicine

## 2017-11-12 ENCOUNTER — Other Ambulatory Visit: Payer: Self-pay | Admitting: Internal Medicine

## 2017-11-12 NOTE — Telephone Encounter (Signed)
The pharmacy called to see if the medication can be filled, if not contact pt for reason

## 2017-11-12 NOTE — Telephone Encounter (Signed)
Original prescription written by provider that is not at this practice; does Dr Nicki Reaper want to continue this medication? Will route to office for review.

## 2017-11-13 NOTE — Telephone Encounter (Signed)
Rx for Furosemide sent to pharmacy.

## 2017-12-03 ENCOUNTER — Ambulatory Visit: Payer: Self-pay

## 2017-12-22 ENCOUNTER — Ambulatory Visit: Payer: Self-pay

## 2017-12-30 ENCOUNTER — Encounter: Payer: Self-pay | Admitting: Internal Medicine

## 2017-12-30 ENCOUNTER — Ambulatory Visit
Admission: RE | Admit: 2017-12-30 | Discharge: 2017-12-30 | Disposition: A | Discharge status: Home / Self Care | Payer: Medicare Other | Source: Ambulatory Visit | Attending: Internal Medicine | Admitting: Internal Medicine

## 2017-12-30 ENCOUNTER — Ambulatory Visit (INDEPENDENT_AMBULATORY_CARE_PROVIDER_SITE_OTHER): Payer: Medicare Other | Admitting: Internal Medicine

## 2017-12-30 VITALS — BP 140/82 | HR 77 | Temp 97.7°F | Resp 18 | Wt 126.8 lb

## 2017-12-30 DIAGNOSIS — N289 Disorder of kidney and ureter, unspecified: Secondary | ICD-10-CM | POA: Diagnosis not present

## 2017-12-30 DIAGNOSIS — D649 Anemia, unspecified: Secondary | ICD-10-CM

## 2017-12-30 DIAGNOSIS — I83009 Varicose veins of unspecified lower extremity with ulcer of unspecified site: Secondary | ICD-10-CM

## 2017-12-30 DIAGNOSIS — E7800 Pure hypercholesterolemia, unspecified: Secondary | ICD-10-CM | POA: Diagnosis not present

## 2017-12-30 DIAGNOSIS — K219 Gastro-esophageal reflux disease without esophagitis: Secondary | ICD-10-CM | POA: Diagnosis not present

## 2017-12-30 DIAGNOSIS — R600 Localized edema: Secondary | ICD-10-CM | POA: Diagnosis not present

## 2017-12-30 DIAGNOSIS — I872 Venous insufficiency (chronic) (peripheral): Secondary | ICD-10-CM | POA: Diagnosis not present

## 2017-12-30 DIAGNOSIS — L97909 Non-pressure chronic ulcer of unspecified part of unspecified lower leg with unspecified severity: Secondary | ICD-10-CM | POA: Diagnosis not present

## 2017-12-30 DIAGNOSIS — L97209 Non-pressure chronic ulcer of unspecified calf with unspecified severity: Secondary | ICD-10-CM | POA: Diagnosis not present

## 2017-12-30 DIAGNOSIS — I10 Essential (primary) hypertension: Secondary | ICD-10-CM

## 2017-12-30 DIAGNOSIS — M7989 Other specified soft tissue disorders: Secondary | ICD-10-CM | POA: Insufficient documentation

## 2017-12-30 MED ORDER — NYSTATIN 100000 UNIT/GM EX CREA: 1.0000 "application " | TOPICAL_CREAM | Freq: Two times a day (BID) | CUTANEOUS | 1 refills | Status: DC

## 2017-12-30 MED ORDER — SULFAMETHOXAZOLE-TRIMETHOPRIM 800-160 MG PO TABS: 1.0000 | ORAL_TABLET | Freq: Two times a day (BID) | ORAL | 0 refills | Status: DC

## 2017-12-30 MED ORDER — TRIAMCINOLONE ACETONIDE 0.5 % EX CREA: TOPICAL_CREAM | Freq: Two times a day (BID) | CUTANEOUS | 0 refills | Status: DC

## 2017-12-30 NOTE — Patient Instructions (Signed)
Take a probiotic daily while you are on the antibiotics and for two weeks after completing the antibiotics.   

## 2017-12-30 NOTE — Progress Notes (Signed)
Patient ID: Kayla Galloway, female   DOB: Aug 28, 1920, 84 y.o.   MRN: [id number]   Subjective:    Patient ID: Kayla Galloway, female    DOB: 05/28/1921, 84 y.o.   MRN: [id number]  HPI  Patient here for a scheduled follow up.  She is having problems with increased swelling and redness of her legs.  She has been scratching.  Reports increased itching.  States otherwise doing well.  No chest pain.  Breathing stable.  Eating well.  No nausea or vomiting.  Bowels moving.  No urine change.  Picks at her skin.  Increased scratching.  Some swelling.     Past Medical History:  Diagnosis Date  . Anemia   . Arthritis   . Cancer ([place])    skin ca lesion of scalp- removed  . Depression   . DVT (deep venous thrombosis), left    bilateral, IVC filter 2010  . GERD (gastroesophageal reflux disease)   . Gout   . Hypercholesterolemia   . Hyperkalemia   . Hypertension    Dr. [name]  . Nephrolithiasis   . Obesity   . Osteoporosis   . Peripheral vascular disease ([place])   . 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 Spider veins    Past Surgical History:  Procedure Laterality Date  . CARDIAC CATHETERIZATION     2010 Does not see a cardiac  doctor  . Colonsopy    . DENTAL SURGERY    . ECTOPIC PREGNANCY SURGERY    . EYE SURGERY Bilateral 2011,2012   cataracts  . FRACTURE SURGERY  2009   hip, rod from knee to hip  . HEMORRHOID SURGERY    . IVC filter Left    pt states it has turned side ways and could not be removed.  Marland Kitchen KYPHOPLASTY  09/03/2012   per patient, she has had kypho x 2:  Surgeon: Winfield Cunas, MD;  Location: [place] [facility];  Service: Neurosurgery;  Laterality: N/A;  Thoracic eight Kyphoplasty  . OPEN REDUCTION INTERNAL FIXATION (ORIF) DISTAL RADIAL FRACTURE Left 09/06/2015   Procedure: OPEN REDUCTION INTERNAL FIXATION (ORIF) DISTAL RADIAL FRACTURE;  Surgeon: Hessie Knows, MD;   Location: [facility];  Service: Orthopedics;  Laterality: Left;  . RIGHT OOPHORECTOMY     partial-  . SKIN CANCER EXCISION     top of head  . TONSILLECTOMY     age 40  . VERTEBROPLASTY  12/31/2011   Procedure: VERTEBROPLASTY;  Surgeon: Winfield Cunas, MD;  Location: [place] [facility];  Service: Neurosurgery;  Laterality: N/A;  Thoracic eleven vertebroplasty   Family History  Problem Relation Age of Onset  . Heart disease Father        myocardial infarction  . Heart disease Brother        myocardial infarction  . Lymphoma Sister   . Anesthesia problems Neg Hx   . Breast cancer Neg Hx   . Colon cancer Neg Hx    Social History   Socioeconomic History  . Marital status: Widowed    Spouse name: Not on file  . Number of children: 0  . Years of education: Not on file  . Highest education level: Not on file  Occupational History  . Not on file  Social Needs  . Financial resource strain: Not on file  . Food insecurity:    Worry: Not on  file    Inability: Not on file  . Transportation needs:    Medical: Not on file    Non-medical: Not on file  Tobacco Use  . Smoking status: Never Smoker  . Smokeless tobacco: Never Used  Substance and Sexual Activity  . Alcohol use: No    Alcohol/week: 0.0 oz  . Drug use: No  . Sexual activity: Never  Lifestyle  . Physical activity:    Days per week: Not on file    Minutes per session: Not on file  . Stress: Not on file  Relationships  . Social connections:    Talks on phone: Not on file    Gets together: Not on file    Attends religious service: Not on file    Active member of club or organization: Not on file    Attends meetings of clubs or organizations: Not on file    Relationship status: Not on file  Other Topics Concern  . Not on file  Social History Narrative  . Not on file    Outpatient Encounter Medications as of 12/30/2017  Medication Sig  . acetaminophen (TYLENOL) 650 MG CR tablet Take 650 mg by mouth every 8 (eight) hours  as needed for pain.   . bifidobacterium infantis (ALIGN) capsule Take 1 capsule by mouth daily.  . Calcium 600-200 MG-UNIT tablet Take 1 tablet by mouth 3 (three) times daily with meals.  . cholecalciferol (VITAMIN D) 1000 units tablet Take 1,000 Units by mouth daily.  Marland Kitchen docusate sodium (COLACE) 100 MG capsule Take 2 capsules (200 mg total) by mouth 2 (two) times daily.  . furosemide (LASIX) 20 MG tablet Take 1 tablet (20 mg total) by mouth 3 (three) times a week.  . furosemide (LASIX) 20 MG tablet Take 1 tablet (20 mg total) by mouth daily.  . Glucosamine-Chondroitin 250-200 MG TABS Take 1 tablet by mouth 2 (two) times daily.   . mupirocin ointment (BACTROBAN) 2 % Apply 1 application topically 2 (two) times daily.  Marland Kitchen nystatin cream (MYCOSTATIN) Apply 1 application topically 2 (two) times daily. Apply under breast  . omeprazole (PRILOSEC) 20 MG capsule TAKE 1 CAPSULE BY MOUTH ONCE DAILY  . phenylephrine (,USE FOR PREPARATION-H,) 0.25 % suppository Place 1 suppository rectally 2 (two) times daily.  . polyethylene glycol (MIRALAX / GLYCOLAX) packet Take 17 g by mouth daily as needed for mild constipation.  . ramipril (ALTACE) 5 MG capsule TAKE 1 CAPSULE BY MOUTH ONCE DAILY  . sertraline (ZOLOFT) 50 MG tablet TAKE 1 TABLET BY MOUTH ONCE DAILY  . sulfamethoxazole-trimethoprim (BACTRIM DS,SEPTRA DS) 800-160 MG tablet Take 1 tablet by mouth 2 (two) times daily.  . traMADol (ULTRAM) 50 MG tablet Take 1 tablet (50 mg total) by mouth every 6 (six) hours as needed for moderate pain.  Marland Kitchen triamcinolone cream (KENALOG) 0.5 % Apply topically 2 (two) times daily.  . [DISCONTINUED] nystatin cream (MYCOSTATIN) Apply 1 application topically 2 (two) times daily.  . [DISCONTINUED] triamcinolone cream (KENALOG) 0.5 %    No facility-administered encounter medications on file as of 12/30/2017.     Review of Systems  Constitutional: Negative for appetite change and fever.  HENT: Negative for congestion and sinus  pressure.   Respiratory: Negative for cough, chest tightness and shortness of breath.   Cardiovascular: Positive for leg swelling. Negative for chest pain and palpitations.  Gastrointestinal: Negative for abdominal pain, nausea and vomiting.  Genitourinary: Negative for difficulty urinating and dysuria.  Musculoskeletal: Negative for joint swelling and  myalgias.  Skin:       Increased redness lower extremities.  Small open lesions.    Neurological: Negative for dizziness, light-headedness and headaches.  Psychiatric/Behavioral: Negative for agitation and dysphoric mood.       Objective:    Physical Exam  Constitutional: She appears well-developed and well-nourished. No distress.  HENT:  Nose: Nose normal.  Mouth/Throat: Oropharynx is clear and moist.  Neck: Neck supple. No thyromegaly present.  Cardiovascular: Normal rate and regular rhythm.  Pulmonary/Chest: Breath sounds normal. No respiratory distress. She has no wheezes.  Abdominal: Soft. Bowel sounds are normal. There is no tenderness.  Musculoskeletal: She exhibits edema.  Lower extremity swelling and redness.  (pedal and lower leg).  Increased tenderness to palpation.  Right leg > left.   Lymphadenopathy:    She has no cervical adenopathy.  Skin: There is erythema.  Psychiatric: She has a normal mood and affect. Her behavior is normal.    BP 140/82 (BP Location: Left Arm, Patient Position: Sitting, Cuff Size: Normal)   Pulse 77   Temp 97.7 F (36.5 C) (Oral)   Resp 18   Wt 126 lb 12.8 oz (57.5 kg)   SpO2 97%   BMI 21.10 kg/m  Wt Readings from Last 3 Encounters:  12/30/17 126 lb 12.8 oz (57.5 kg)  09/24/17 123 lb 3.2 oz (55.9 kg)  09/14/17 124 lb (56.2 kg)     Lab Results  Component Value Date   WBC 4.9 09/14/2017   HGB 11.2 (L) 09/14/2017   HCT 33.1 (L) 09/14/2017   PLT 159.0 09/14/2017   GLUCOSE 107 (H) 09/24/2017   CHOL 229 (H) 07/12/2012   TRIG 87.0 07/12/2012   HDL 86.30 07/12/2012   LDLDIRECT  135.9 07/12/2012   ALT 14 09/14/2017   AST 23 09/14/2017   NA 136 09/24/2017   K 4.6 09/24/2017   CL 104 09/24/2017   CREATININE 1.30 (H) 09/24/2017   BUN 30 (H) 09/24/2017   CO2 26 09/24/2017   TSH 2.20 09/14/2017   INR 1.00 01/24/2015    Dg Ribs Unilateral W/chest Left  Result Date: 02/03/2017 CLINICAL DATA:  84 y/o F; status post fall with left-sided rib pain. EXAM: LEFT RIBS AND CHEST - 3+ VIEW COMPARISON:  None. FINDINGS: Mildly displaced fractures of the left sixth and eighth ribs. No additional appreciable rib fracture identified. IMPRESSION: Mildly displaced fractures of the left sixth and eighth ribs. No additional appreciable rib fracture identified. Electronically Signed   By: Kristine Garbe M.D.   On: 02/03/2017 20:56   Dg Scapula Left  Result Date: 02/03/2017 CLINICAL DATA:  Tripped over garden hose and fell on left side. Left shoulder pain. Concern for scapular injury. Initial encounter. EXAM: LEFT SCAPULA - 2+ VIEWS COMPARISON:  Left shoulder radiographs performed earlier today at 8:16 p.m. FINDINGS: The left scapula appears grossly intact. The left humeral head remains seated at the glenoid fossa. Mild degenerative change is noted at the left acromioclavicular joint. The previously noted mildly displaced left lateral eighth rib fracture is not imaged on this study. IMPRESSION: No evidence of fracture or dislocation. Left scapula appears intact. Electronically Signed   By: Garald Balding M.D.   On: 02/03/2017 22:13   Dg Ankle Complete Left  Result Date: 02/03/2017 CLINICAL DATA:  Tripped over garden hose and fell on left side. Left ankle pain. Initial encounter. EXAM: LEFT ANKLE COMPLETE - 3+ VIEW COMPARISON:  Left tibia/fibula radiographs performed 01/12/2015 FINDINGS: There is no evidence of fracture or dislocation.  The ankle mortise is intact; the interosseous space is within normal limits. No talar tilt or subluxation is seen. The joint spaces are preserved. Mild  medial soft tissue swelling is noted. Diffuse vascular calcifications are seen. IMPRESSION: 1. No evidence of fracture or dislocation. 2. Diffuse vascular calcifications seen. Electronically Signed   By: Garald Balding M.D.   On: 02/03/2017 22:18   Ct Head Wo Contrast  Result Date: 02/03/2017 CLINICAL DATA:  Tripped over a garden hose and fell onto LEFT side, complaining of LEFT-sided shoulder pain, bruising to LEFT side of face, history hypertension EXAM: CT HEAD WITHOUT CONTRAST CT CERVICAL SPINE WITHOUT CONTRAST TECHNIQUE: Multidetector CT imaging of the head and cervical spine was performed following the standard protocol without intravenous contrast. Multiplanar CT image reconstructions of the cervical spine were also generated. COMPARISON:  CT head 09/02/2015 FINDINGS: CT HEAD FINDINGS Brain: Generalized atrophy. Normal ventricular morphology. No midline shift or mass effect. Small vessel chronic ischemic changes of deep cerebral white matter. No intracranial hemorrhage, mass lesion, evidence of acute infarction, or extra-axial fluid collection. Vascular: Atherosclerotic calcifications of the internal carotid and vertebral arteries at skullbase. Skull: Intact Sinuses/Orbits: Chronic mucosal thickening and partial opacity of the LEFT maxillary sinus with osseous wall thickening. Remaining paranasal sinuses and mastoid air cells clear Other: N/A CT CERVICAL SPINE FINDINGS Alignment: 3.3 mm of anterolisthesis at C3-C4. Less than 2 mm of retrolisthesis at C4-C5, C5-C6, and C6-C7. Skull base and vertebrae: Visualized skullbase intact. Vertebral body heights maintained. No fracture or bone destruction. Mild scattered facet degenerative changes cervical spine. Soft tissues and spinal canal: Prevertebral soft tissues normal thickness. Atherosclerotic calcifications at the carotid bifurcations bilaterally and at the great vessels in the lower cervical region. Disc levels: Multilevel disc space narrowing and  scattered endplate spurs. Scattered bony neural foraminal narrowing. Upper chest: Biapical lung scarring. Other: Degenerative changes BILATERAL temporomandibular joints. IMPRESSION: Atrophy with small-vessel chronic ischemic changes of deep cerebral white matter. No acute intracranial abnormalities. Chronic LEFT maxillary sinus disease. Osseous demineralization with degenerative disc and facet disease changes of the cervical spine associated with 3.3 mm of anterolisthesis at C3-C4 and mild degrees of retrolisthesis at C4-C5 through C6-C7. No acute fractures identified. Electronically Signed   By: Lavonia Dana M.D.   On: 02/03/2017 20:19   Ct Cervical Spine Wo Contrast  Result Date: 02/03/2017 CLINICAL DATA:  Tripped over a garden hose and fell onto LEFT side, complaining of LEFT-sided shoulder pain, bruising to LEFT side of face, history hypertension EXAM: CT HEAD WITHOUT CONTRAST CT CERVICAL SPINE WITHOUT CONTRAST TECHNIQUE: Multidetector CT imaging of the head and cervical spine was performed following the standard protocol without intravenous contrast. Multiplanar CT image reconstructions of the cervical spine were also generated. COMPARISON:  CT head 09/02/2015 FINDINGS: CT HEAD FINDINGS Brain: Generalized atrophy. Normal ventricular morphology. No midline shift or mass effect. Small vessel chronic ischemic changes of deep cerebral white matter. No intracranial hemorrhage, mass lesion, evidence of acute infarction, or extra-axial fluid collection. Vascular: Atherosclerotic calcifications of the internal carotid and vertebral arteries at skullbase. Skull: Intact Sinuses/Orbits: Chronic mucosal thickening and partial opacity of the LEFT maxillary sinus with osseous wall thickening. Remaining paranasal sinuses and mastoid air cells clear Other: N/A CT CERVICAL SPINE FINDINGS Alignment: 3.3 mm of anterolisthesis at C3-C4. Less than 2 mm of retrolisthesis at C4-C5, C5-C6, and C6-C7. Skull base and vertebrae:  Visualized skullbase intact. Vertebral body heights maintained. No fracture or bone destruction. Mild scattered facet degenerative changes cervical spine. Soft  tissues and spinal canal: Prevertebral soft tissues normal thickness. Atherosclerotic calcifications at the carotid bifurcations bilaterally and at the great vessels in the lower cervical region. Disc levels: Multilevel disc space narrowing and scattered endplate spurs. Scattered bony neural foraminal narrowing. Upper chest: Biapical lung scarring. Other: Degenerative changes BILATERAL temporomandibular joints. IMPRESSION: Atrophy with small-vessel chronic ischemic changes of deep cerebral white matter. No acute intracranial abnormalities. Chronic LEFT maxillary sinus disease. Osseous demineralization with degenerative disc and facet disease changes of the cervical spine associated with 3.3 mm of anterolisthesis at C3-C4 and mild degrees of retrolisthesis at C4-C5 through C6-C7. No acute fractures identified. Electronically Signed   By: Lavonia Dana M.D.   On: 02/03/2017 20:19   Dg Shoulder Left  Result Date: 02/03/2017 CLINICAL DATA:  84 year old female with left shoulder pain. EXAM: LEFT SHOULDER - 2+ VIEW COMPARISON:  Left rib radiograph dated 02/03/2017 and the left ovary radiograph dated 02/03/2017 FINDINGS: There is no acute fracture of the left shoulder. No dislocation. There are chronic degenerative changes of the left humeral head. The bones are osteopenic. Partially visualized minimally displaced left lateral rib fracture. This is better evaluated on the rib series radiograph. Multilevel thoracic compression deformities and vertebroplasty changes. Stable cardiac silhouette. IMPRESSION: 1. No acute fracture or dislocation of the left shoulder. 2. Minimally displaced fracture of the lateral aspect of a left rib. Electronically Signed   By: Anner Crete M.D.   On: 02/03/2017 20:58       Assessment & Plan:   Problem List Items Addressed  This Visit    Anemia    hgb has been stable.  Follow cbc.       Bilateral lower extremity edema    Some issues with increased swelling (R>L).  Increased redness.  Some changes c/w stasis changes and stasis dermatitis.  Concern over possible cellulitis as well.  Increased warmth and redness.  Treat with bactrim.  She has taken and tolerated.  Probiotic as directed.  Also TCC cream topically.  Will get her back in with Dr Ola Spurr.  Elevate legs.        Relevant Orders   Ambulatory referral to Infectious Disease   GERD (gastroesophageal reflux disease)    Controlled on current regimen.  Follow.       Hypercholesterolemia    Follow lipid panel.        Hypertension    Blood pressure has been under reasonable control.  Follow.        Renal insufficiency    Follow metabolic panel.  Avoid antiinflammatories.        Stasis ulcer ([place])    Small open lesions on lower extremities.  Redness as outlined.  Treat with abx.  TCC cream to help with stasis dermatitis.  Refer back to ID.         Other Visit Diagnoses    Swelling of right lower extremity    -  Primary   Relevant Orders   US Venous Img Lower Unilateral Right (Completed)   Ambulatory referral to Infectious Disease   Stasis ulcer, unspecified laterality ([place])   (Chronic)     Relevant Orders   Ambulatory referral to Infectious Disease       [name], MD

## 2018-01-01 NOTE — Assessment & Plan Note (Signed)
Some issues with increased swelling (R>L).  Increased redness.  Some changes c/w stasis changes and stasis dermatitis.  Concern over possible cellulitis as well.  Increased warmth and redness.  Treat with bactrim.  She has taken and tolerated.  Probiotic as directed.  Also TCC cream topically.  Will get her back in with Dr Ola Spurr.  Elevate legs.

## 2018-01-10 ENCOUNTER — Encounter: Payer: Self-pay | Admitting: Internal Medicine

## 2018-01-10 NOTE — Assessment & Plan Note (Signed)
Follow metabolic panel.  Avoid antiinflammatories.   

## 2018-01-10 NOTE — Assessment & Plan Note (Signed)
Blood pressure has been under reasonable control.  Follow.   

## 2018-01-10 NOTE — Assessment & Plan Note (Signed)
Controlled on current regimen.  Follow.  

## 2018-01-10 NOTE — Assessment & Plan Note (Signed)
Follow lipid panel.   

## 2018-01-10 NOTE — Assessment & Plan Note (Signed)
hgb has been stable.  Follow cbc.

## 2018-01-10 NOTE — Assessment & Plan Note (Signed)
Small open lesions on lower extremities.  Redness as outlined.  Treat with abx.  TCC cream to help with stasis dermatitis.  Refer back to ID.

## 2018-01-15 ENCOUNTER — Telehealth: Payer: Self-pay | Admitting: *Deleted

## 2018-01-15 NOTE — Telephone Encounter (Signed)
Kayla Galloway's sister [name] is declining the referral for right now, stating that she does not have an ulcer at this time. She said her sister will pick at flakes or dry areas of skin, causing breakdown, redness, and infection. [name] usually calls to make appointments as needed.  Landis Gandy, RN

## 2018-02-15 ENCOUNTER — Ambulatory Visit (INDEPENDENT_AMBULATORY_CARE_PROVIDER_SITE_OTHER): Payer: Medicare Other | Admitting: Internal Medicine

## 2018-02-15 ENCOUNTER — Other Ambulatory Visit: Payer: Self-pay | Admitting: Internal Medicine

## 2018-02-15 ENCOUNTER — Encounter: Payer: Self-pay | Admitting: Internal Medicine

## 2018-02-15 VITALS — BP 126/78 | HR 62 | Temp 97.5°F | Resp 18 | Wt 126.2 lb

## 2018-02-15 DIAGNOSIS — I10 Essential (primary) hypertension: Secondary | ICD-10-CM

## 2018-02-15 DIAGNOSIS — L989 Disorder of the skin and subcutaneous tissue, unspecified: Secondary | ICD-10-CM | POA: Diagnosis not present

## 2018-02-15 DIAGNOSIS — E7800 Pure hypercholesterolemia, unspecified: Secondary | ICD-10-CM

## 2018-02-15 DIAGNOSIS — D649 Anemia, unspecified: Secondary | ICD-10-CM

## 2018-02-15 DIAGNOSIS — R600 Localized edema: Secondary | ICD-10-CM

## 2018-02-15 DIAGNOSIS — N289 Disorder of kidney and ureter, unspecified: Secondary | ICD-10-CM

## 2018-02-15 DIAGNOSIS — K219 Gastro-esophageal reflux disease without esophagitis: Secondary | ICD-10-CM

## 2018-02-15 LAB — IBC PANEL
Iron: 118 ug/dL (ref 42–145)
Saturation Ratios: 31.3 % (ref 20.0–50.0)
Transferrin: 269 mg/dL (ref 212.0–360.0)

## 2018-02-15 LAB — CBC WITH DIFFERENTIAL/PLATELET
Basophils Absolute: 0 10*3/uL (ref 0.0–0.1)
Basophils Relative: 0.4 % (ref 0.0–3.0)
Eosinophils Absolute: 0.1 10*3/uL (ref 0.0–0.7)
Eosinophils Relative: 1.2 % (ref 0.0–5.0)
HEMATOCRIT: 31.6 % — AB (ref 36.0–46.0)
HEMOGLOBIN: 10.8 g/dL — AB (ref 12.0–15.0)
LYMPHS PCT: 26.5 % (ref 12.0–46.0)
Lymphs Abs: 1.4 10*3/uL (ref 0.7–4.0)
MCHC: 34.1 g/dL (ref 30.0–36.0)
MCV: 100.1 fl — AB (ref 78.0–100.0)
MONOS PCT: 13.6 % — AB (ref 3.0–12.0)
Monocytes Absolute: 0.7 10*3/uL (ref 0.1–1.0)
NEUTROS ABS: 3.2 10*3/uL (ref 1.4–7.7)
Neutrophils Relative %: 58.3 % (ref 43.0–77.0)
PLATELETS: 139 10*3/uL — AB (ref 150.0–400.0)
RBC: 3.16 Mil/uL — AB (ref 3.87–5.11)
RDW: 14.3 % (ref 11.5–15.5)
WBC: 5.4 10*3/uL (ref 4.0–10.5)

## 2018-02-15 LAB — BASIC METABOLIC PANEL
BUN: 32 mg/dL — AB (ref 6–23)
CO2: 29 meq/L (ref 19–32)
CREATININE: 1.24 mg/dL — AB (ref 0.40–1.20)
Calcium: 9.4 mg/dL (ref 8.4–10.5)
Chloride: 100 mEq/L (ref 96–112)
GFR: 42.75 mL/min — ABNORMAL LOW (ref >60.00)
GLUCOSE: 95 mg/dL (ref 70–99)
Potassium: 4.7 mEq/L (ref 3.5–5.1)
Sodium: 135 mEq/L (ref 135–145)

## 2018-02-15 LAB — VITAMIN B12: Vitamin B-12: 501 pg/mL (ref 211–911)

## 2018-02-15 LAB — FERRITIN: Ferritin: 125.2 ng/mL (ref 10.0–291.0)

## 2018-02-15 NOTE — Progress Notes (Signed)
Order placed for f/u lab.   

## 2018-02-15 NOTE — Progress Notes (Signed)
Patient ID: Kayla Galloway, female   DOB: 04-24-1921, 84 y.o.   MRN: [id number]   Subjective:    Patient ID: Kayla Galloway, female    DOB: [birth year], 85 y.o.   MRN: [id number]  HPI  Patient here for a scheduled follow up.  She is accompanied by her sister.  History obtained from both of them.  She is doing better.  Feels better.  Legs doing better.  Decreased swelling.  Decreased redness.  Eating.  No nausea or vomiting.  Bowels moving.  No urine change.  No chest pain.  Breathing stable.    Past Medical History:  Diagnosis Date  . Anemia   . Arthritis   . Cancer ([place])    skin ca lesion of scalp- removed  . Depression   . DVT (deep venous thrombosis), left    bilateral, IVC filter 2010  . GERD (gastroesophageal reflux disease)   . Gout   . Hypercholesterolemia   . Hyperkalemia   . Hypertension    Dr. [name]  . Nephrolithiasis   . Obesity   . Osteoporosis   . Peripheral vascular disease ([place])   . 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 Spider veins    Past Surgical History:  Procedure Laterality Date  . CARDIAC CATHETERIZATION     2010 Does not see a cardiac  doctor  . Colonsopy    . DENTAL SURGERY    . ECTOPIC PREGNANCY SURGERY    . EYE SURGERY Bilateral 2011,2012   cataracts  . FRACTURE SURGERY  2009   hip, rod from knee to hip  . HEMORRHOID SURGERY    . IVC filter Left    pt states it has turned side ways and could not be removed.  Marland Kitchen KYPHOPLASTY  09/03/2012   per patient, she has had kypho x 2:  Surgeon: Winfield Cunas, MD;  Location: [facility];  Service: Neurosurgery;  Laterality: N/A;  Thoracic eight Kyphoplasty  . OPEN REDUCTION INTERNAL FIXATION (ORIF) DISTAL RADIAL FRACTURE Left 09/06/2015   Procedure: OPEN REDUCTION INTERNAL FIXATION (ORIF) DISTAL RADIAL FRACTURE;  Surgeon: Hessie Knows, MD;  Location: [facility] ORS;  Service: Orthopedics;   Laterality: Left;  . RIGHT OOPHORECTOMY     partial-  . SKIN CANCER EXCISION     top of head  . TONSILLECTOMY     age 36  . VERTEBROPLASTY  12/31/2011   Procedure: VERTEBROPLASTY;  Surgeon: Winfield Cunas, MD;  Location: [facility];  Service: Neurosurgery;  Laterality: N/A;  Thoracic eleven vertebroplasty   Family History  Problem Relation Age of Onset  . Heart disease Father        myocardial infarction  . Heart disease Brother        myocardial infarction  . Lymphoma Sister   . Anesthesia problems Neg Hx   . Breast cancer Neg Hx   . Colon cancer Neg Hx    Social History   Socioeconomic History  . Marital status: Widowed    Spouse name: Not on file  . Number of children: 0  . Years of education: Not on file  . Highest education level: Not on file  Occupational History  . Not on file  Social Needs  . Financial resource strain: Not on file  . Food insecurity:    Worry: Not on file    Inability: Not on file  .  Transportation needs:    Medical: Not on file    Non-medical: Not on file  Tobacco Use  . Smoking status: Never Smoker  . Smokeless tobacco: Never Used  Substance and Sexual Activity  . Alcohol use: No    Alcohol/week: 0.0 oz  . Drug use: No  . Sexual activity: Never  Lifestyle  . Physical activity:    Days per week: Not on file    Minutes per session: Not on file  . Stress: Not on file  Relationships  . Social connections:    Talks on phone: Not on file    Gets together: Not on file    Attends religious service: Not on file    Active member of club or organization: Not on file    Attends meetings of clubs or organizations: Not on file    Relationship status: Not on file  Other Topics Concern  . Not on file  Social History Narrative  . Not on file    Outpatient Encounter Medications as of 02/15/2018  Medication Sig  . acetaminophen (TYLENOL) 650 MG CR tablet Take 650 mg by mouth every 8 (eight) hours as needed for pain.   . bifidobacterium  infantis (ALIGN) capsule Take 1 capsule by mouth daily.  . Calcium 600-200 MG-UNIT tablet Take 1 tablet by mouth 3 (three) times daily with meals.  . cholecalciferol (VITAMIN D) 1000 units tablet Take 1,000 Units by mouth daily.  Marland Kitchen docusate sodium (COLACE) 100 MG capsule Take 2 capsules (200 mg total) by mouth 2 (two) times daily.  . furosemide (LASIX) 20 MG tablet Take 1 tablet (20 mg total) by mouth daily.  . Glucosamine-Chondroitin 250-200 MG TABS Take 1 tablet by mouth 2 (two) times daily.   . mupirocin ointment (BACTROBAN) 2 % Apply 1 application topically 2 (two) times daily.  Marland Kitchen nystatin cream (MYCOSTATIN) Apply 1 application topically 2 (two) times daily. Apply under breast  . omeprazole (PRILOSEC) 20 MG capsule TAKE 1 CAPSULE BY MOUTH ONCE DAILY  . phenylephrine (,USE FOR PREPARATION-H,) 0.25 % suppository Place 1 suppository rectally 2 (two) times daily.  . polyethylene glycol (MIRALAX / GLYCOLAX) packet Take 17 g by mouth daily as needed for mild constipation.  . ramipril (ALTACE) 5 MG capsule TAKE 1 CAPSULE BY MOUTH ONCE DAILY  . sertraline (ZOLOFT) 50 MG tablet TAKE 1 TABLET BY MOUTH ONCE DAILY  . sulfamethoxazole-trimethoprim (BACTRIM DS,SEPTRA DS) 800-160 MG tablet Take 1 tablet by mouth 2 (two) times daily.  . traMADol (ULTRAM) 50 MG tablet Take 1 tablet (50 mg total) by mouth every 6 (six) hours as needed for moderate pain.  Marland Kitchen triamcinolone cream (KENALOG) 0.5 % Apply topically 2 (two) times daily.  . [DISCONTINUED] furosemide (LASIX) 20 MG tablet Take 1 tablet (20 mg total) by mouth 3 (three) times a week.   No facility-administered encounter medications on file as of 02/15/2018.     Review of Systems  Constitutional: Negative for appetite change and unexpected weight change.  HENT: Negative for congestion and sinus pressure.   Respiratory: Negative for cough, chest tightness and shortness of breath.   Cardiovascular: Negative for chest pain and palpitations.       Leg  swelling improved.    Gastrointestinal: Negative for abdominal pain, diarrhea, nausea and vomiting.  Genitourinary: Negative for difficulty urinating and dysuria.  Musculoskeletal: Negative for joint swelling and myalgias.  Skin: Negative for color change and rash.  Neurological: Negative for dizziness, light-headedness and headaches.  Psychiatric/Behavioral: Negative for agitation  and dysphoric mood.       Objective:    Physical Exam  BP 126/78 (BP Location: Left Arm, Patient Position: Sitting, Cuff Size: Normal)   Pulse 62   Temp (!) 97.5 F (36.4 C) (Oral)   Resp 18   Wt 126 lb 3.2 oz (57.2 kg)   SpO2 96%   BMI 21.00 kg/m  Wt Readings from Last 3 Encounters:  02/15/18 126 lb 3.2 oz (57.2 kg)  12/30/17 126 lb 12.8 oz (57.5 kg)  09/24/17 123 lb 3.2 oz (55.9 kg)     Lab Results  Component Value Date   WBC 5.4 02/15/2018   HGB 10.8 (L) 02/15/2018   HCT 31.6 (L) 02/15/2018   PLT 139.0 (L) 02/15/2018   GLUCOSE 95 02/15/2018   CHOL 229 (H) 07/12/2012   TRIG 87.0 07/12/2012   HDL 86.30 07/12/2012   LDLDIRECT 135.9 07/12/2012   ALT 14 09/14/2017   AST 23 09/14/2017   NA 135 02/15/2018   K 4.7 02/15/2018   CL 100 02/15/2018   CREATININE 1.24 (H) 02/15/2018   BUN 32 (H) 02/15/2018   CO2 29 02/15/2018   TSH 2.20 09/14/2017   INR 1.00 01/24/2015    US Venous Img Lower Unilateral Right  Result Date: 12/30/2017 CLINICAL DATA:  Right lower extremity edema and erythema. EXAM: RIGHT LOWER EXTREMITY VENOUS DOPPLER ULTRASOUND TECHNIQUE: Gray-scale sonography with graded compression, as well as color Doppler and duplex ultrasound were performed to evaluate the lower extremity deep venous systems from the level of the common femoral vein and including the common femoral, femoral, profunda femoral, popliteal and calf veins including the posterior tibial, peroneal and gastrocnemius veins when visible. The superficial great saphenous vein was also interrogated. Spectral Doppler was  utilized to evaluate flow at rest and with distal augmentation maneuvers in the common femoral, femoral and popliteal veins. COMPARISON:  06/11/2016 FINDINGS: Contralateral Common Femoral Vein: Respiratory phasicity is normal and symmetric with the symptomatic side. No evidence of thrombus. Normal compressibility. Common Femoral Vein: No evidence of thrombus. Normal compressibility, respiratory phasicity and response to augmentation. Saphenofemoral Junction: No evidence of thrombus. Normal compressibility and flow on color Doppler imaging. Profunda Femoral Vein: No evidence of thrombus. Normal compressibility and flow on color Doppler imaging. Femoral Vein: No evidence of thrombus. Normal compressibility, respiratory phasicity and response to augmentation. Popliteal Vein: No evidence of thrombus. Normal compressibility, respiratory phasicity and response to augmentation. Calf Veins: No evidence of thrombus. Normal compressibility and flow on color Doppler imaging. Superficial Great Saphenous Vein: No evidence of thrombus. Normal compressibility. Venous Reflux:  None. Other Findings: No evidence of superficial thrombophlebitis or abnormal fluid collection. Multiple right inguinal lymph nodes identified, similar in size to prior ultrasound. None are overtly enlarged. These may be reactive lymph nodes. IMPRESSION: No evidence of right lower extremity deep venous thrombosis. Electronically Signed   By: Aletta Edouard M.D.   On: 12/30/2017 15:41       Assessment & Plan:   Problem List Items Addressed This Visit    Anemia - Primary    hgb has been stable.  Follow cbc.        Relevant Orders   Ferritin (Completed)   CBC with Differential/Platelet (Completed)   Vitamin B12 (Completed)   IBC panel (Completed)   Bilateral lower extremity edema    Improved.  On lasix daily now.  Follow met b.        GERD (gastroesophageal reflux disease)    Controlled on current regimen.  Hypercholesterolemia     Follow lipid panel.        Hypertension    Blood pressure under good control.  Continue same medication regimen.  Follow pressures.  Follow metabolic panel.        Relevant Orders   Basic metabolic panel (Completed)   Renal insufficiency    On lasix.  Recheck metabolic panel today.        Skin lesions    Improved.  No evidence of infection.  Follow.            [name], MD

## 2018-02-17 ENCOUNTER — Telehealth: Payer: Self-pay | Admitting: Internal Medicine

## 2018-02-17 NOTE — Telephone Encounter (Signed)
Copied from [place] [id number]. Topic: Quick Communication - Lab Results >> Feb 17, 2018  4:25 PM Lars Masson, LPN wrote: Called patient to inform them of lab results. When patient returns call, triage nurse may disclose results. >> Feb 17, 2018  4:41 PM Bea Graff, NT wrote: Pt calling back to get her lab results.

## 2018-02-18 NOTE — Assessment & Plan Note (Signed)
Improved.  No evidence of infection.  Follow.

## 2018-02-18 NOTE — Assessment & Plan Note (Signed)
Follow lipid panel.   

## 2018-02-18 NOTE — Assessment & Plan Note (Signed)
Controlled on current regimen.   

## 2018-02-18 NOTE — Assessment & Plan Note (Signed)
Blood pressure under good control.  Continue same medication regimen.  Follow pressures.  Follow metabolic panel.   

## 2018-02-18 NOTE — Assessment & Plan Note (Signed)
On lasix.  Recheck metabolic panel today.

## 2018-02-18 NOTE — Assessment & Plan Note (Signed)
Improved.  On lasix daily now.  Follow met b.

## 2018-02-18 NOTE — Assessment & Plan Note (Signed)
hgb has been stable.  Follow cbc.  

## 2018-02-19 NOTE — Telephone Encounter (Signed)
LMTCB OK for PEC to give results. 

## 2018-03-22 ENCOUNTER — Other Ambulatory Visit: Payer: Self-pay

## 2018-03-22 ENCOUNTER — Encounter: Payer: Self-pay | Admitting: Emergency Medicine

## 2018-03-22 ENCOUNTER — Inpatient Hospital Stay
Admission: EM | Admit: 2018-03-22 | Discharge: 2018-03-25 | DRG: 535 | Disposition: A | Discharge status: Skilled Nursing Facility | Payer: Medicare Other | Attending: Internal Medicine | Admitting: Internal Medicine

## 2018-03-22 ENCOUNTER — Emergency Department: Payer: Medicare Other

## 2018-03-22 DIAGNOSIS — S32511A Fracture of superior rim of right pubis, initial encounter for closed fracture: Secondary | ICD-10-CM | POA: Diagnosis not present

## 2018-03-22 DIAGNOSIS — Z85828 Personal history of other malignant neoplasm of skin: Secondary | ICD-10-CM | POA: Diagnosis not present

## 2018-03-22 DIAGNOSIS — Z886 Allergy status to analgesic agent status: Secondary | ICD-10-CM | POA: Diagnosis not present

## 2018-03-22 DIAGNOSIS — J189 Pneumonia, unspecified organism: Secondary | ICD-10-CM | POA: Diagnosis present

## 2018-03-22 DIAGNOSIS — S32592A Other specified fracture of left pubis, initial encounter for closed fracture: Secondary | ICD-10-CM | POA: Diagnosis not present

## 2018-03-22 DIAGNOSIS — M25572 Pain in left ankle and joints of left foot: Secondary | ICD-10-CM | POA: Diagnosis not present

## 2018-03-22 DIAGNOSIS — W19XXXD Unspecified fall, subsequent encounter: Secondary | ICD-10-CM | POA: Diagnosis not present

## 2018-03-22 DIAGNOSIS — K219 Gastro-esophageal reflux disease without esophagitis: Secondary | ICD-10-CM | POA: Diagnosis present

## 2018-03-22 DIAGNOSIS — W1830XA Fall on same level, unspecified, initial encounter: Secondary | ICD-10-CM | POA: Diagnosis present

## 2018-03-22 DIAGNOSIS — N179 Acute kidney failure, unspecified: Secondary | ICD-10-CM | POA: Diagnosis present

## 2018-03-22 DIAGNOSIS — D638 Anemia in other chronic diseases classified elsewhere: Secondary | ICD-10-CM | POA: Diagnosis present

## 2018-03-22 DIAGNOSIS — M810 Age-related osteoporosis without current pathological fracture: Secondary | ICD-10-CM | POA: Diagnosis present

## 2018-03-22 DIAGNOSIS — I739 Peripheral vascular disease, unspecified: Secondary | ICD-10-CM | POA: Diagnosis present

## 2018-03-22 DIAGNOSIS — S32591A Other specified fracture of right pubis, initial encounter for closed fracture: Secondary | ICD-10-CM

## 2018-03-22 DIAGNOSIS — R339 Retention of urine, unspecified: Secondary | ICD-10-CM | POA: Diagnosis present

## 2018-03-22 DIAGNOSIS — E785 Hyperlipidemia, unspecified: Secondary | ICD-10-CM | POA: Diagnosis not present

## 2018-03-22 DIAGNOSIS — Z881 Allergy status to other antibiotic agents status: Secondary | ICD-10-CM

## 2018-03-22 DIAGNOSIS — S3289XA Fracture of other parts of pelvis, initial encounter for closed fracture: Secondary | ICD-10-CM | POA: Diagnosis present

## 2018-03-22 DIAGNOSIS — D649 Anemia, unspecified: Secondary | ICD-10-CM | POA: Diagnosis not present

## 2018-03-22 DIAGNOSIS — R52 Pain, unspecified: Secondary | ICD-10-CM

## 2018-03-22 DIAGNOSIS — Z880 Allergy status to penicillin: Secondary | ICD-10-CM | POA: Diagnosis not present

## 2018-03-22 DIAGNOSIS — Z86718 Personal history of other venous thrombosis and embolism: Secondary | ICD-10-CM

## 2018-03-22 DIAGNOSIS — J9601 Acute respiratory failure with hypoxia: Secondary | ICD-10-CM

## 2018-03-22 DIAGNOSIS — W19XXXA Unspecified fall, initial encounter: Secondary | ICD-10-CM | POA: Diagnosis not present

## 2018-03-22 DIAGNOSIS — S32501A Unspecified fracture of right pubis, initial encounter for closed fracture: Secondary | ICD-10-CM | POA: Diagnosis not present

## 2018-03-22 DIAGNOSIS — I10 Essential (primary) hypertension: Secondary | ICD-10-CM | POA: Diagnosis present

## 2018-03-22 DIAGNOSIS — Y92009 Unspecified place in unspecified non-institutional (private) residence as the place of occurrence of the external cause: Secondary | ICD-10-CM | POA: Diagnosis not present

## 2018-03-22 DIAGNOSIS — Z885 Allergy status to narcotic agent status: Secondary | ICD-10-CM | POA: Diagnosis not present

## 2018-03-22 DIAGNOSIS — S32511D Fracture of superior rim of right pubis, subsequent encounter for fracture with routine healing: Secondary | ICD-10-CM | POA: Diagnosis not present

## 2018-03-22 DIAGNOSIS — Z9181 History of falling: Secondary | ICD-10-CM

## 2018-03-22 DIAGNOSIS — S93402A Sprain of unspecified ligament of left ankle, initial encounter: Secondary | ICD-10-CM | POA: Diagnosis present

## 2018-03-22 DIAGNOSIS — Z66 Do not resuscitate: Secondary | ICD-10-CM | POA: Diagnosis present

## 2018-03-22 DIAGNOSIS — S99912A Unspecified injury of left ankle, initial encounter: Secondary | ICD-10-CM | POA: Diagnosis not present

## 2018-03-22 DIAGNOSIS — E7800 Pure hypercholesterolemia, unspecified: Secondary | ICD-10-CM | POA: Diagnosis present

## 2018-03-22 DIAGNOSIS — M1990 Unspecified osteoarthritis, unspecified site: Secondary | ICD-10-CM | POA: Diagnosis not present

## 2018-03-22 DIAGNOSIS — M7989 Other specified soft tissue disorders: Secondary | ICD-10-CM | POA: Diagnosis not present

## 2018-03-22 DIAGNOSIS — Z95828 Presence of other vascular implants and grafts: Secondary | ICD-10-CM

## 2018-03-22 DIAGNOSIS — F329 Major depressive disorder, single episode, unspecified: Secondary | ICD-10-CM | POA: Diagnosis present

## 2018-03-22 DIAGNOSIS — Z79899 Other long term (current) drug therapy: Secondary | ICD-10-CM

## 2018-03-22 DIAGNOSIS — F0390 Unspecified dementia without behavioral disturbance: Secondary | ICD-10-CM | POA: Diagnosis present

## 2018-03-22 DIAGNOSIS — S32591D Other specified fracture of right pubis, subsequent encounter for fracture with routine healing: Secondary | ICD-10-CM | POA: Diagnosis not present

## 2018-03-22 DIAGNOSIS — R0602 Shortness of breath: Secondary | ICD-10-CM | POA: Diagnosis not present

## 2018-03-22 DIAGNOSIS — Z888 Allergy status to other drugs, medicaments and biological substances status: Secondary | ICD-10-CM

## 2018-03-22 DIAGNOSIS — M109 Gout, unspecified: Secondary | ICD-10-CM | POA: Diagnosis not present

## 2018-03-22 DIAGNOSIS — Z7401 Bed confinement status: Secondary | ICD-10-CM | POA: Diagnosis not present

## 2018-03-22 DIAGNOSIS — R609 Edema, unspecified: Secondary | ICD-10-CM | POA: Diagnosis not present

## 2018-03-22 DIAGNOSIS — S2242XD Multiple fractures of ribs, left side, subsequent encounter for fracture with routine healing: Secondary | ICD-10-CM | POA: Diagnosis not present

## 2018-03-22 DIAGNOSIS — M25551 Pain in right hip: Secondary | ICD-10-CM | POA: Diagnosis not present

## 2018-03-22 DIAGNOSIS — S329XXA Fracture of unspecified parts of lumbosacral spine and pelvis, initial encounter for closed fracture: Secondary | ICD-10-CM | POA: Diagnosis not present

## 2018-03-22 DIAGNOSIS — S0181XD Laceration without foreign body of other part of head, subsequent encounter: Secondary | ICD-10-CM | POA: Diagnosis not present

## 2018-03-22 DIAGNOSIS — M6281 Muscle weakness (generalized): Secondary | ICD-10-CM | POA: Diagnosis not present

## 2018-03-22 LAB — COMPREHENSIVE METABOLIC PANEL
ALK PHOS: 33 U/L — AB (ref 38–126)
ALT: 19 U/L (ref 0–44)
AST: 30 U/L (ref 15–41)
Albumin: 3.8 g/dL (ref 3.5–5.0)
Anion gap: 11 (ref 5–15)
BUN: 32 mg/dL — AB (ref 8–23)
CALCIUM: 9.2 mg/dL (ref 8.9–10.3)
CHLORIDE: 100 mmol/L (ref 98–111)
CO2: 24 mmol/L (ref 22–32)
CREATININE: 1.13 mg/dL — AB (ref 0.44–1.00)
GFR, EST AFRICAN AMERICAN: 47 mL/min — AB (ref >60)
GFR, EST NON AFRICAN AMERICAN: 40 mL/min — AB (ref >60)
Glucose, Bld: 134 mg/dL — ABNORMAL HIGH (ref 70–99)
Potassium: 4.2 mmol/L (ref 3.5–5.1)
Sodium: 135 mmol/L (ref 135–145)
Total Bilirubin: 0.7 mg/dL (ref 0.3–1.2)
Total Protein: 6.3 g/dL — ABNORMAL LOW (ref 6.5–8.1)

## 2018-03-22 LAB — URINALYSIS, ROUTINE W REFLEX MICROSCOPIC
Bilirubin Urine: NEGATIVE
GLUCOSE, UA: NEGATIVE mg/dL
KETONES UR: NEGATIVE mg/dL
Leukocytes, UA: NEGATIVE
Nitrite: NEGATIVE
PROTEIN: NEGATIVE mg/dL
Specific Gravity, Urine: 1.011 (ref 1.005–1.030)
pH: 5 (ref 5.0–8.0)

## 2018-03-22 LAB — PROTIME-INR
INR: 1.08
Prothrombin Time: 13.9 seconds (ref 11.4–15.2)

## 2018-03-22 LAB — CBC WITH DIFFERENTIAL/PLATELET
BASOS ABS: 0 10*3/uL (ref 0–0.1)
Basophils Relative: 0 %
Eosinophils Absolute: 0 10*3/uL (ref 0–0.7)
Eosinophils Relative: 0 %
HCT: 32.8 % — ABNORMAL LOW (ref 35.0–47.0)
HEMOGLOBIN: 11.1 g/dL — AB (ref 12.0–16.0)
LYMPHS ABS: 0.9 10*3/uL — AB (ref 1.0–3.6)
LYMPHS PCT: 9 %
MCH: 34 pg (ref 26.0–34.0)
MCHC: 34 g/dL (ref 32.0–36.0)
MCV: 100.2 fL — AB (ref 80.0–100.0)
Monocytes Absolute: 0.7 10*3/uL (ref 0.2–0.9)
Monocytes Relative: 7 %
NEUTROS PCT: 84 %
Neutro Abs: 8.8 10*3/uL — ABNORMAL HIGH (ref 1.4–6.5)
Platelets: 119 10*3/uL — ABNORMAL LOW (ref 150–440)
RBC: 3.27 MIL/uL — AB (ref 3.80–5.20)
RDW: 13.7 % (ref 11.5–14.5)
WBC: 10.4 10*3/uL (ref 3.6–11.0)

## 2018-03-22 LAB — TROPONIN I

## 2018-03-22 LAB — APTT: APTT: 30 s (ref 24–36)

## 2018-03-22 MED ORDER — LEVOFLOXACIN IN D5W 750 MG/150ML IV SOLN
750.0000 mg | Freq: Once | INTRAVENOUS | Status: AC
  Administered 2018-03-23: 750 mg via INTRAVENOUS
  Filled 2018-03-22: qty 150

## 2018-03-22 MED ORDER — MORPHINE SULFATE (PF) 2 MG/ML IV SOLN
2.0000 mg | INTRAVENOUS | Status: DC | PRN
  Administered 2018-03-23: 2 mg via INTRAVENOUS
  Filled 2018-03-22: qty 1

## 2018-03-22 MED ORDER — VANCOMYCIN HCL IN DEXTROSE 1-5 GM/200ML-% IV SOLN
1000.0000 mg | Freq: Once | INTRAVENOUS | Status: AC
  Administered 2018-03-23: 1000 mg via INTRAVENOUS
  Filled 2018-03-22: qty 200

## 2018-03-22 MED ORDER — MORPHINE SULFATE (PF) 4 MG/ML IV SOLN
4.0000 mg | Freq: Once | INTRAVENOUS | Status: DC
  Filled 2018-03-22: qty 1

## 2018-03-22 MED ORDER — SODIUM CHLORIDE 0.9 % IV BOLUS (SEPSIS)
500.0000 mL | Freq: Once | INTRAVENOUS | Status: AC
  Administered 2018-03-23: 500 mL via INTRAVENOUS

## 2018-03-22 MED ORDER — MORPHINE SULFATE (PF) 2 MG/ML IV SOLN
2.0000 mg | Freq: Once | INTRAVENOUS | Status: AC
Start: 2018-03-22 — End: 2018-03-22
  Administered 2018-03-22: 2 mg via INTRAVENOUS
  Filled 2018-03-22: qty 1

## 2018-03-22 NOTE — ED Notes (Signed)
Pt assisted onto bedpan (x3 assist) for cc urine sample; pt c/o increased pain with movement, unable to position self

## 2018-03-22 NOTE — H&P (Addendum)
[facility] NAME: Kayla Galloway    MR#:  [id number]  DATE OF BIRTH:  1920/08/21  DATE OF ADMISSION:  03/22/2018  PRIMARY CARE PHYSICIAN: [name], MD   REQUESTING/REFERRING PHYSICIAN:   CHIEF COMPLAINT:   Chief Complaint  Patient presents with  . Fall    HISTORY OF PRESENT ILLNESS: Kayla Galloway  is a 84 y.o. female with a known history of dementia, anemia of chronic diseases, hypertension and other comorbidities. Patient lives by herself but her sister lives close by and checks on her several times a day. Patient was brought to emergency room for severe right hip pain status post mechanical fall at home.  Patient fell face down, but denies hitting her head or losing consciousness.  No chest pain, fever or chills. Patient has a history of frequent falls due to her unsteady gait.  She is supposed to use a walker but she forgets to use it. At the arrival to emergency room patient was noted with hypoxia, oxygen saturation was 88% on room air.  Blood test done emergency room are notable for elevated BUN at 32 and elevated creatinine at 1.13. Chest x-ray shows left basilar opacity.  Pelvis x-ray reveals acute bilateral inferior pubic and right superior pubic rami fractures with equivocal left superior pubic ramus fracture as well.  Patient is admitted for further evaluation and treatment.  PAST MEDICAL HISTORY:   Past Medical History:  Diagnosis Date  . Anemia   . Arthritis   . Cancer ([place])    skin ca lesion of scalp- removed  . Depression   . DVT (deep venous thrombosis), left    bilateral, IVC filter 2010  . GERD (gastroesophageal reflux disease)   . Gout   . Hypercholesterolemia   . Hyperkalemia   . Hypertension    Dr. [name]  . Nephrolithiasis   . Obesity   . Osteoporosis   . Peripheral vascular disease ([place])   . 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 Spider veins     PAST SURGICAL HISTORY:  Past Surgical History:  Procedure Laterality Date  . CARDIAC CATHETERIZATION     2010 Does not see a cardiac  doctor  . Colonsopy    . DENTAL SURGERY    . ECTOPIC PREGNANCY SURGERY    . EYE SURGERY Bilateral 2011,2012   cataracts  . FRACTURE SURGERY  2009   hip, rod from knee to hip  . HEMORRHOID SURGERY    . IVC filter Left    pt states it has turned side ways and could not be removed.  Marland Kitchen KYPHOPLASTY  09/03/2012   per patient, she has had kypho x 2:  Surgeon: Winfield Cunas, MD;  Location: [facility];  Service: Neurosurgery;  Laterality: N/A;  Thoracic eight Kyphoplasty  . OPEN REDUCTION INTERNAL FIXATION (ORIF) DISTAL RADIAL FRACTURE Left 09/06/2015   Procedure: OPEN REDUCTION INTERNAL FIXATION (ORIF) DISTAL RADIAL FRACTURE;  Surgeon: Hessie Knows, MD;  Location: [facility] ORS;  Service: Orthopedics;  Laterality: Left;  . RIGHT OOPHORECTOMY     partial-  . SKIN CANCER EXCISION     top of head  . TONSILLECTOMY     age 36  . VERTEBROPLASTY  12/31/2011   Procedure: VERTEBROPLASTY;  Surgeon: Winfield Cunas, MD;  Location: [facility];  Service: Neurosurgery;  Laterality:  N/A;  Thoracic eleven vertebroplasty    SOCIAL HISTORY:  Social History   Tobacco Use  . Smoking status: Never Smoker  . Smokeless tobacco: Never Used  Substance Use Topics  . Alcohol use: No    Alcohol/week: 0.0 oz    FAMILY HISTORY:  Family History  Problem Relation Age of Onset  . Heart disease Father        myocardial infarction  . Heart disease Brother        myocardial infarction  . Lymphoma Sister   . Anesthesia problems Neg Hx   . Breast cancer Neg Hx   . Colon cancer Neg Hx     DRUG ALLERGIES:  Allergies  Allergen Reactions  . Cefuroxime Axetil Rash    Pt states that she does fine with Keflex.    . Doxycycline Nausea Only and Other (See Comments)    Reaction:  Weight loss   . Advil  [Ibuprofen] Swelling  . Celebrex [Celecoxib] Nausea Only  . Daypro [Oxaprozin] Other (See Comments)    Reaction:  Dizziness   . Etodolac Nausea Only  . Macrobid [employer] Macro] Other (See Comments)    Reaction:  Burning   . Penicillins Other (See Comments)    Reaction:  Burning   . Percocet [Oxycodone-Acetaminophen] Nausea Only and Swelling  . Tramadol Other (See Comments)    Reaction:  Unknown   . Valium [Diazepam] Other (See Comments)    Reaction:  Dizziness    . Neosporin [Neomycin-Bacitracin Zn-Polymyx] Rash  . Vicodin [Hydrocodone-Acetaminophen] Nausea Only    REVIEW OF SYSTEMS:   CONSTITUTIONAL: No fever, fatigue or weakness.  EYES: No blurred or double vision.  EARS, NOSE, AND THROAT: No tinnitus or ear pain.  RESPIRATORY: No cough, shortness of breath, wheezing or hemoptysis.  CARDIOVASCULAR: No chest pain, orthopnea, edema.  GASTROINTESTINAL: No nausea, vomiting, diarrhea or abdominal pain.  GENITOURINARY: No dysuria, hematuria.  ENDOCRINE: No polyuria, nocturia,  HEMATOLOGY: No bleeding SKIN: No rash or lesion. MUSCULOSKELETAL: Positive history of osteoarthritis and unstable gait.  Positive right hip pain and left ankle pain NEUROLOGIC: No focal weakness.  PSYCHIATRY: Positive history of anxiety/depression disorder.   MEDICATIONS AT HOME:  Prior to Admission medications   Medication Sig Start Date End Date Taking? Authorizing Provider  acetaminophen (TYLENOL) 650 MG CR tablet Take 650 mg by mouth every 8 (eight) hours as needed for pain.    Yes [provider]  bifidobacterium infantis (ALIGN) capsule Take 1 capsule by mouth daily. 05/22/14  Yes [name], MD  Calcium 600-200 MG-UNIT tablet Take 1 tablet by mouth 3 (three) times daily with meals.   Yes [provider]  cholecalciferol (VITAMIN D) 1000 units tablet Take 1,000 Units by mouth daily.   Yes [provider]  docusate sodium (COLACE) 100 MG capsule Take 2  capsules (200 mg total) by mouth 2 (two) times daily. 02/10/17  Yes Vaughan Basta, MD  ferrous sulfate 325 (65 FE) MG tablet Take 325 mg by mouth every evening.   Yes [provider]  furosemide (LASIX) 20 MG tablet Take 1 tablet (20 mg total) by mouth daily. 11/13/17  Yes [name], MD  Glucosamine-Chondroitin 250-200 MG TABS Take 1 tablet by mouth 2 (two) times daily.    Yes [provider]  mupirocin ointment (BACTROBAN) 2 % Apply 1 application topically 2 (two) times daily. 09/15/17  Yes [name], MD  nystatin cream (MYCOSTATIN) Apply 1 application topically 2 (two) times daily. Apply under breast 12/30/17  Yes [name], MD  omeprazole (PRILOSEC) 20 MG capsule TAKE 1 CAPSULE BY MOUTH ONCE DAILY 07/06/17  Yes [name], MD  phenylephrine (,USE FOR PREPARATION-H,) 0.25 % suppository Place 1 suppository rectally 2 (two) times daily.   Yes [provider]  polyethylene glycol (MIRALAX / GLYCOLAX) packet Take 17 g by mouth daily as needed for mild constipation. 02/10/17  Yes Vaughan Basta, MD  ramipril (ALTACE) 5 MG capsule TAKE 1 CAPSULE BY MOUTH ONCE DAILY 09/10/17  Yes [name], MD  sertraline (ZOLOFT) 50 MG tablet TAKE 1 TABLET BY MOUTH ONCE DAILY 07/06/17  Yes [name], MD  traMADol (ULTRAM) 50 MG tablet Take 1 tablet (50 mg total) by mouth every 6 (six) hours as needed for moderate pain. 02/10/17  Yes Vaughan Basta, MD  triamcinolone cream (KENALOG) 0.5 % Apply topically 2 (two) times daily. 12/30/17  Yes [name], MD      PHYSICAL EXAMINATION:   VITAL SIGNS: Blood pressure (!) 141/70, pulse 84, temperature 97.6 F (36.4 C), temperature source Oral, resp. rate (!) 24, height 5' (1.524 m), weight 59.5 kg (131 lb 1.6 oz), SpO2 96 %.  GENERAL:  84 y.o.-year-old patient lying in the bed with moderate distress, secondary to pain.  She complains of pain everywhere, especially when she needs to  move. EYES: Pupils equal, round, reactive to light and accommodation. No scleral icterus.  HEENT: Head atraumatic, normocephalic. Oropharynx and nasopharynx clear.  NECK:  Supple, no jugular venous distention. No thyroid enlargement, no tenderness.  LUNGS: Reduced breath sounds bilaterally, no wheezing, rales,rhonchi. No use of accessory muscles of respiration.  CARDIOVASCULAR: S1, S2 normal. No S3/S4.  ABDOMEN: Soft, nontender, nondistended. Bowel sounds present. No organomegaly or mass.  EXTREMITIES: There is severe pain with palpation of the pelvis bilaterally, worse at the right hip area.  Left ankle is noted with mild edema, but no gross deformity and no significant tenderness to palpation, although there is pain with range of motion. NEUROLOGIC: No focal weakness. PSYCHIATRIC: The patient is alert and oriented x 3.  SKIN: Patient is noted with numerous bruises on her upper and lower extremities from frequent falls.   LABORATORY PANEL:   CBC Recent Labs  Lab 03/22/18 1928  WBC 10.4  HGB 11.1*  HCT 32.8*  PLT 119*  MCV 100.2*  MCH 34.0  MCHC 34.0  RDW 13.7  LYMPHSABS 0.9*  MONOABS 0.7  EOSABS 0.0  BASOSABS 0.0   ------------------------------------------------------------------------------------------------------------------  Chemistries  Recent Labs  Lab 03/22/18 1928  NA 135  K 4.2  CL 100  CO2 24  GLUCOSE 134*  BUN 32*  CREATININE 1.13*  CALCIUM 9.2  AST 30  ALT 19  ALKPHOS 33*  BILITOT 0.7   ------------------------------------------------------------------------------------------------------------------ estimated creatinine clearance is 24.6 mL/min (A) (by C-G formula based on SCr of 1.13 mg/dL (H)). ------------------------------------------------------------------------------------------------------------------ No results for input(s): TSH, T4TOTAL, T3FREE, THYROIDAB in the last 72 hours.  Invalid input(s): FREET3   Coagulation profile Recent Labs   Lab 03/22/18 1928  INR 1.08   ------------------------------------------------------------------------------------------------------------------- No results for input(s): DDIMER in the last 72 hours. -------------------------------------------------------------------------------------------------------------------  Cardiac Enzymes Recent Labs  Lab 03/22/18 1928  TROPONINI <0.03   ------------------------------------------------------------------------------------------------------------------ Invalid input(s): POCBNP  ---------------------------------------------------------------------------------------------------------------  Urinalysis    Component Value Date/Time   COLORURINE YELLOW 06/18/2016 1040   APPEARANCEUR SL CLOUDY (A) 06/18/2016 1040   APPEARANCEUR Clear 08/04/2012 2154   LABSPEC 1.010 06/18/2016 1040   LABSPEC 1.009 08/04/2012 2154   PHURINE 7.0 06/18/2016 1040  GLUCOSEU NEGATIVE 06/18/2016 1040   HGBUR NEGATIVE 06/18/2016 1040   BILIRUBINUR negative 09/24/2017 1501   BILIRUBINUR neg 08/15/2014 1102   BILIRUBINUR Negative 08/04/2012 2154   KETONESUR negative 09/24/2017 1501   KETONESUR NEGATIVE 06/18/2016 1040   PROTEINUR negative 09/24/2017 1501   PROTEINUR neg 08/15/2014 1102   PROTEINUR Negative 08/04/2012 2154   UROBILINOGEN 0.2 09/24/2017 1501   UROBILINOGEN 0.2 06/18/2016 1040   NITRITE Negative 09/24/2017 1501   NITRITE POSITIVE (A) 06/18/2016 1040   LEUKOCYTESUR Negative 09/24/2017 1501   LEUKOCYTESUR Negative 08/04/2012 2154     RADIOLOGY: Dg Ankle Complete Left  Result Date: 03/22/2018 CLINICAL DATA:  Patient fell today and presents with left ankle pain. EXAM: LEFT ANKLE COMPLETE - 3+ VIEW COMPARISON:  02/03/2017 FINDINGS: There is no evidence of fracture, dislocation, or joint effusion. There is no evidence of arthropathy or other focal bone abnormality. Mild soft tissue induration and swelling is noted of the included left leg and about  the malleoli. Minimal enthesopathy off the dorsum and plantar aspect of the calcaneus. Vascular phleboliths and calcifications are noted as before along the tibial and dorsalis pedis arteries in particular. IMPRESSION: Soft tissue swelling of the left ankle without acute fracture or joint dislocation. Electronically Signed   By: Ashley Royalty M.D.   On: 03/22/2018 20:48   Dg Chest Portable 1 View  Result Date: 03/22/2018 CLINICAL DATA:  Acute onset of shortness of breath and hypoxemia. EXAM: PORTABLE CHEST 1 VIEW COMPARISON:  Chest radiograph performed 03/25/2017 FINDINGS: The lungs are mildly hypoexpanded. Mild left basilar airspace opacity may reflect atelectasis or mild pneumonia. There is no evidence of pleural effusion or pneumothorax. The cardiomediastinal silhouette is within normal limits. No acute osseous abnormalities are seen. The patient is status post vertebroplasty at multiple levels along the thoracic spine. IMPRESSION: Lungs mildly hypoexpanded. Mild left basilar airspace opacity may reflect atelectasis or mild pneumonia. Electronically Signed   By: Garald Balding M.D.   On: 03/22/2018 22:14   Dg Hip Unilat W Or Wo Pelvis 2-3 Views Right  Result Date: 03/22/2018 CLINICAL DATA:  Patient fell today complaining of right hip and ankle pain. EXAM: DG HIP (WITH OR WITHOUT PELVIS) 2-3V RIGHT COMPARISON:  08/05/2012 CT FINDINGS: Acute bilateral inferior pubic and right superior pubic rami fractures are noted with a suspicious lucency also involving the left superior pubic ramus that may represent a nondisplaced pubic ramus fracture is well. Slight caudal displacement of the right parasymphysis secondary to fracture is noted. Vertebral augmentation cement is identified within the L4 and L5 vertebral bodies. Adjacent right IVC filter is in place. No proximal femoral fracture is noted with left femoral nail fixation identified. No joint dislocation of either hip. IMPRESSION: 1. Acute bilateral inferior  pubic and right superior pubic rami fractures are noted with equivocal left superior pubic ramus fracture as well. 2. Vertebral augmentation L4 and L5. 3. No hip fracture. Electronically Signed   By: Ashley Royalty M.D.   On: 03/22/2018 20:45    EKG: Orders placed or performed during the hospital encounter of 03/22/18  . EKG 12-Lead  . EKG 12-Lead  . ED EKG  . ED EKG  . EKG 12-Lead  . EKG 12-Lead    IMPRESSION AND PLAN:  1.  Acute bilateral inferior pubic and right superior pubic rami fractures, status post mechanical fall. We will continue pain control.  We will have orthopedic team, PT and OT evaluate and treat the patient. 2.  Acute respiratory failure with hypoxia,  secondary to pneumonia.  We will start antibiotic therapy with IV Levaquin.  Continue supportive measures with duo nebs and oxygen therapy as needed 3.  CAP, see treatment as above under #2. 4.  Acute renal failure, likely prerenal, secondary to poor p.o. fluid intake.  We will start gentle IV hydration and monitor kidney function closely.  Avoid nephrotoxic medications. 5.  Hypertension, stable, continue home medications.  All the records are reviewed and case discussed with ED provider. Management plans discussed with the patient, family and they are in agreement.  CODE STATUS: Full Code Status History    Date Active Date Inactive Code Status Order ID Comments User Context   02/04/2017 0238 02/10/2017 1817 Full Code [id number]  Lance Coon, MD ED   09/06/2015 1246 09/06/2015 1907 Full Code [id number]  Hessie Knows, MD Inpatient   01/24/2015 1430 01/26/2015 1907 Full Code [id number]  Max Sane, MD Inpatient   01/12/2015 2248 01/15/2015 1428 Full Code [id number]  Aldean Jewett, MD Inpatient    Advance Directive Documentation     Most Recent Value  Type of Advance Directive  Healthcare Power of Attorney, Living will [facility]]  Pre-existing out of facility DNR order (yellow form or pink MOST form)  -  "MOST" Form in  Place?  -       TOTAL TIME TAKING CARE OF THIS PATIENT: 45 minutes.    Amelia Jo M.D on 03/22/2018 at 11:21 PM  Between 7am to 6pm - Pager - [phone]  After 6pm go to [email or web address] - password [email or web address] [facility]  Office  [phone]  CC: Primary care physician; [name], MD

## 2018-03-22 NOTE — ED Notes (Signed)
Pt A&Ox3 with no distress noted; pt reports lives alone, normally uses walker; was on commode, went to get up and became dizzy then fell; pt c/o pain to right hip, left ankle & foot; denies hitting head or c/o HA; skin tear noted to right & left elbow; purplish bruising to FA's, cheeks and temple; no deformity noted; resp even/unlab, lungs clear, apical audible & regular; +BS, abd soft/nondist, +periph pulses with no edema noted; yellow slipper socks placed on pt; skin tears clensed with NS and adaptic guaze dressing applied; o2 sat 89% on ra; O2 placed at 2l/min via [place]

## 2018-03-22 NOTE — ED Provider Notes (Signed)
[facility] Emergency Department Provider Note  ____________________________________________   First MD Initiated Contact with Patient 03/22/18 2020     (approximate)  I have reviewed the triage vital signs and the nursing notes.   HISTORY  Chief Complaint Fall    HPI Kayla Galloway is a 84 y.o. female with extensive past medical history as listed below who presents by EMS for pain after a fall.  Her sister is with her at bedside and helps provide some of her care.  The patient is reportedly unsteady on her feet and should use a walker but frequently does not use it.  She fell today and did not strike her head but landed "face down".  She has acute and severe pain in her right hip as well as her left ankle.  Both sites are worse with any amount of movement.  Of note, EMS reported that her SPO2 was 88-89% when they arrived and she does not usually have an oxygen requirement.  She and her sister both deny that she has had any other recent symptoms.  She denies chest pain, shortness of breath, fever/chills, nausea, vomiting, and abdominal pain.  Nothing particular makes her symptoms better except for the fentanyl she received in route by EMS.  Past Medical History:  Diagnosis Date  . Anemia   . Arthritis   . Cancer ([place])    skin ca lesion of scalp- removed  . Depression   . DVT (deep venous thrombosis), left    bilateral, IVC filter 2010  . GERD (gastroesophageal reflux disease)   . Gout   . Hypercholesterolemia   . Hyperkalemia   . Hypertension    Dr. [name]  . Nephrolithiasis   . Obesity   . Osteoporosis   . Peripheral vascular disease ([place])   . 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 Spider veins     Patient Active Problem List   Diagnosis Date Noted  . Fracture of oth parts of pelvis, init for clos fx ([place]) 03/22/2018  . Back pain  03/25/2017  . Hyponatremia 02/09/2017  . Rib fractures 02/04/2017  . GERD (gastroesophageal reflux disease) 02/04/2017  . Skin lesions 11/09/2016  . Bilateral lower extremity edema 11/04/2016  . S/P IVC filter 06/24/2016  . Weight loss 04/11/2014  . Stasis ulcer ([place]) 07/07/2013  . Anemia 06/28/2012  . Hypertension 06/28/2012  . Hypercholesterolemia 06/28/2012  . Renal insufficiency 06/28/2012  . Osteoporosis 06/28/2012    Past Surgical History:  Procedure Laterality Date  . CARDIAC CATHETERIZATION     2010 Does not see a cardiac  doctor  . Colonsopy    . DENTAL SURGERY    . ECTOPIC PREGNANCY SURGERY    . EYE SURGERY Bilateral 2011,2012   cataracts  . FRACTURE SURGERY  2009   hip, rod from knee to hip  . HEMORRHOID SURGERY    . IVC filter Left    pt states it has turned side ways and could not be removed.  Marland Kitchen KYPHOPLASTY  09/03/2012   per patient, she has had kypho x 2:  Surgeon: Winfield Cunas, MD;  Location: [place] [facility];  Service: Neurosurgery;  Laterality: N/A;  Thoracic eight Kyphoplasty  . OPEN REDUCTION INTERNAL FIXATION (ORIF) DISTAL RADIAL FRACTURE Left 09/06/2015   Procedure: OPEN REDUCTION INTERNAL FIXATION (ORIF) DISTAL RADIAL FRACTURE;  Surgeon: Hessie Knows, MD;  Location:  [facility] ORS;  Service: Orthopedics;  Laterality: Left;  . RIGHT OOPHORECTOMY     partial-  . SKIN CANCER EXCISION     top of head  . TONSILLECTOMY     age 14  . VERTEBROPLASTY  12/31/2011   Procedure: VERTEBROPLASTY;  Surgeon: Winfield Cunas, MD;  Location: [place] [facility];  Service: Neurosurgery;  Laterality: N/A;  Thoracic eleven vertebroplasty    Prior to Admission medications   Medication Sig Start Date End Date Taking? Authorizing Provider  acetaminophen (TYLENOL) 650 MG CR tablet Take 650 mg by mouth every 8 (eight) hours as needed for pain.    Yes [provider]  bifidobacterium infantis (ALIGN) capsule Take 1 capsule by mouth daily. 05/22/14  Yes [name], MD  Calcium  600-200 MG-UNIT tablet Take 1 tablet by mouth 3 (three) times daily with meals.   Yes [provider]  cholecalciferol (VITAMIN D) 1000 units tablet Take 1,000 Units by mouth daily.   Yes [provider]  docusate sodium (COLACE) 100 MG capsule Take 2 capsules (200 mg total) by mouth 2 (two) times daily. 02/10/17  Yes Vaughan Basta, MD  ferrous sulfate 325 (65 FE) MG tablet Take 325 mg by mouth every evening.   Yes [provider]  furosemide (LASIX) 20 MG tablet Take 1 tablet (20 mg total) by mouth daily. 11/13/17  Yes [name], MD  Glucosamine-Chondroitin 250-200 MG TABS Take 1 tablet by mouth 2 (two) times daily.    Yes [provider]  mupirocin ointment (BACTROBAN) 2 % Apply 1 application topically 2 (two) times daily. 09/15/17  Yes [name], MD  nystatin cream (MYCOSTATIN) Apply 1 application topically 2 (two) times daily. Apply under breast 12/30/17  Yes [name], MD  omeprazole (PRILOSEC) 20 MG capsule TAKE 1 CAPSULE BY MOUTH ONCE DAILY 07/06/17  Yes [name], MD  phenylephrine (,USE FOR PREPARATION-H,) 0.25 % suppository Place 1 suppository rectally 2 (two) times daily.   Yes [provider]  polyethylene glycol (MIRALAX / GLYCOLAX) packet Take 17 g by mouth daily as needed for mild constipation. 02/10/17  Yes Vaughan Basta, MD  ramipril (ALTACE) 5 MG capsule TAKE 1 CAPSULE BY MOUTH ONCE DAILY 09/10/17  Yes [name], MD  sertraline (ZOLOFT) 50 MG tablet TAKE 1 TABLET BY MOUTH ONCE DAILY 07/06/17  Yes [name], MD  traMADol (ULTRAM) 50 MG tablet Take 1 tablet (50 mg total) by mouth every 6 (six) hours as needed for moderate pain. 02/10/17  Yes Vaughan Basta, MD  triamcinolone cream (KENALOG) 0.5 % Apply topically 2 (two) times daily. 12/30/17  Yes [name], MD    Allergies Cefuroxime axetil; Doxycycline; Advil [ibuprofen]; Celebrex [celecoxib]; Daypro [oxaprozin]; Etodolac;  Macrobid [nitrofurantoin monohyd macro]; Penicillins; Percocet [oxycodone-acetaminophen]; Tramadol; Valium [diazepam]; Neosporin [neomycin-bacitracin zn-polymyx]; and Vicodin [hydrocodone-acetaminophen]  Family History  Problem Relation Age of Onset  . Heart disease Father        myocardial infarction  . Heart disease Brother        myocardial infarction  . Lymphoma Sister   . Anesthesia problems Neg Hx   . Breast cancer Neg Hx   . Colon cancer Neg Hx     Social History Social History   Tobacco Use  . Smoking status: Never Smoker  . Smokeless tobacco: Never Used  Substance Use Topics  . Alcohol use: No    Alcohol/week: 0.0 oz  . Drug use: No    Review of Systems Constitutional: No fever/chills Eyes: No visual  changes. ENT: No sore throat. Cardiovascular: Denies chest pain. Respiratory: Denies shortness of breath but hypoxemic per EMS Gastrointestinal: No abdominal pain.  No nausea, no vomiting.  No diarrhea.  No constipation. Genitourinary: Negative for dysuria. Musculoskeletal: Acute pain in right hip and left ankle as described above Integumentary: Negative for rash. Neurological: Negative for headaches, focal weakness or numbness.   ____________________________________________   PHYSICAL EXAM:  VITAL SIGNS: ED Triage Vitals  Enc Vitals Group     BP 03/22/18 1915 (!) 170/87     Pulse Rate 03/22/18 1915 78     Resp 03/22/18 1915 18     Temp 03/22/18 1915 97.6 F (36.4 C)     Temp Source 03/22/18 1915 Oral     SpO2 03/22/18 1915 (!) 89 %     Weight 03/22/18 1924 59.5 kg (131 lb 1.6 oz)     Height 03/22/18 1924 1.524 m (5')     Head Circumference --      Peak Flow --      Pain Score 03/22/18 1925 5     Pain Loc --      Pain Edu? --      Excl. in [place]? --     Constitutional: Alert and oriented.  Elderly and in mild distress from pain after the fall Eyes: Conjunctivae are normal.  Head: Atraumatic. Nose: No congestion/rhinnorhea. Mouth/Throat: Mucous  membranes are moist. Neck: No stridor.  No meningeal signs.   Cardiovascular: Normal rate, regular rhythm. Good peripheral circulation. Grossly normal heart sounds. Respiratory: Normal respiratory effort.  No retractions. Lungs CTAB. Gastrointestinal: Soft and nontender. No distention.  Musculoskeletal: Severe pain with palpation of the pelvis as well as the right hip.  There is some swelling around the left ankle but with no gross deformity and no significant tenderness to palpation but some pain with range of motion. Neurologic:  Normal speech and language. No gross focal neurologic deficits are appreciated.  Skin:  Skin is warm, dry but with skin tear on right arm.   ____________________________________________   LABS (all labs ordered are listed, but only abnormal results are displayed)  Labs Reviewed  CBC WITH DIFFERENTIAL/PLATELET - Abnormal; Notable for the following components:      Result Value   RBC 3.27 (*)    Hemoglobin 11.1 (*)    HCT 32.8 (*)    MCV 100.2 (*)    Platelets 119 (*)    Neutro Abs 8.8 (*)    Lymphs Abs 0.9 (*)    All other components within normal limits  COMPREHENSIVE METABOLIC PANEL - Abnormal; Notable for the following components:   Glucose, Bld 134 (*)    BUN 32 (*)    Creatinine, Ser 1.13 (*)    Total Protein 6.3 (*)    Alkaline Phosphatase 33 (*)    GFR calc non Af Amer 40 (*)    GFR calc Af Amer 47 (*)    All other components within normal limits  URINALYSIS, ROUTINE W REFLEX MICROSCOPIC - Abnormal; Notable for the following components:   Color, Urine YELLOW (*)    APPearance HAZY (*)    Hgb urine dipstick MODERATE (*)    Bacteria, UA RARE (*)    All other components within normal limits  CULTURE, BLOOD (ROUTINE X 2)  CULTURE, BLOOD (ROUTINE X 2)  TROPONIN I  PROTIME-INR  APTT   ____________________________________________  EKG  ED ECG REPORT I, Hinda Kehr, the attending physician, personally viewed and interpreted this  ECG.  Date:  03/22/2018 EKG Time: 19: 18 Rate: 76 Rhythm: normal sinus rhythm QRS Axis: normal Intervals: Left bundle branch block ST/T Wave abnormalities: Non-specific ST segment / T-wave changes, but no evidence of acute ischemia. Narrative Interpretation: no evidence of acute ischemia   ____________________________________________  RADIOLOGY I, Hinda Kehr, personally viewed and evaluated these images (plain radiographs) as part of my medical decision making, as well as reviewing the written report by the radiologist.  ED MD interpretation: No evidence of acute fracture dislocation of the left ankle.  At least 2 fractures to the pubic rami (right superior and inferior) as well as possible left-sided superior ramus fracture as well.  Official radiology report(s): Dg Ankle Complete Left  Result Date: 03/22/2018 CLINICAL DATA:  Patient fell today and presents with left ankle pain. EXAM: LEFT ANKLE COMPLETE - 3+ VIEW COMPARISON:  02/03/2017 FINDINGS: There is no evidence of fracture, dislocation, or joint effusion. There is no evidence of arthropathy or other focal bone abnormality. Mild soft tissue induration and swelling is noted of the included left leg and about the malleoli. Minimal enthesopathy off the dorsum and plantar aspect of the calcaneus. Vascular phleboliths and calcifications are noted as before along the tibial and dorsalis pedis arteries in particular. IMPRESSION: Soft tissue swelling of the left ankle without acute fracture or joint dislocation. Electronically Signed   By: Ashley Royalty M.D.   On: 03/22/2018 20:48   Dg Chest Portable 1 View  Result Date: 03/22/2018 CLINICAL DATA:  Acute onset of shortness of breath and hypoxemia. EXAM: PORTABLE CHEST 1 VIEW COMPARISON:  Chest radiograph performed 03/25/2017 FINDINGS: The lungs are mildly hypoexpanded. Mild left basilar airspace opacity may reflect atelectasis or mild pneumonia. There is no evidence of pleural effusion or  pneumothorax. The cardiomediastinal silhouette is within normal limits. No acute osseous abnormalities are seen. The patient is status post vertebroplasty at multiple levels along the thoracic spine. IMPRESSION: Lungs mildly hypoexpanded. Mild left basilar airspace opacity may reflect atelectasis or mild pneumonia. Electronically Signed   By: Garald Balding M.D.   On: 03/22/2018 22:14   Dg Hip Unilat W Or Wo Pelvis 2-3 Views Right  Result Date: 03/22/2018 CLINICAL DATA:  Patient fell today complaining of right hip and ankle pain. EXAM: DG HIP (WITH OR WITHOUT PELVIS) 2-3V RIGHT COMPARISON:  08/05/2012 CT FINDINGS: Acute bilateral inferior pubic and right superior pubic rami fractures are noted with a suspicious lucency also involving the left superior pubic ramus that may represent a nondisplaced pubic ramus fracture is well. Slight caudal displacement of the right parasymphysis secondary to fracture is noted. Vertebral augmentation cement is identified within the L4 and L5 vertebral bodies. Adjacent right IVC filter is in place. No proximal femoral fracture is noted with left femoral nail fixation identified. No joint dislocation of either hip. IMPRESSION: 1. Acute bilateral inferior pubic and right superior pubic rami fractures are noted with equivocal left superior pubic ramus fracture as well. 2. Vertebral augmentation L4 and L5. 3. No hip fracture. Electronically Signed   By: Ashley Royalty M.D.   On: 03/22/2018 20:45    ____________________________________________   PROCEDURES  Critical Care performed: No   Procedure(s) performed:   Procedures   ____________________________________________   INITIAL IMPRESSION / ASSESSMENT AND PLAN / ED COURSE  As part of my medical decision making, I reviewed the following data within the [place] History obtained from family, Nursing notes reviewed and incorporated, Labs reviewed , EKG interpreted , Radiograph reviewed , Discussed with  admitting physician  and Notes from prior ED visits    Differential diagnosis includes, but is not limited to, hip fracture/dislocation, pelvic fracture, ankle fracture, foot fracture, ankle sprain.  The hypoxemia could be from splinting or acute pain but also could be from pneumonia.  Lab work is notable for no evidence of urinary tract infection, essentially normal conference of metabolic panel, and no leukocytosis on CBC.  However the chest x-ray is concerning for possible left lower lobe pneumonia in the setting the hypoxemia and tachypnea I think it is appropriate to treat empirically.  She has multiple pubic rami fractures and cannot tolerate any movement or weightbearing on her lower extremities.  She required 3 rounds of IV pain medication including that given by EMS and her pain was still intractable.  She will require orthopedics consultation, PT/OT, and probable rehab placement.  I discussed all this with the patient and her sister as well as the hospitalist.   Clinical Course as of Mar 23 40  Mon Mar 22, 2018  2251 Intractable pain from pelvic fractures, I will admit for orthopedics consult and likely PT/OT and rehab placement.  The patient also has a new oxygen requirement and a small opacity on chest x-ray that could represent community-acquired pneumonia.  I have ordered blood cultures and empiric antibiotics according to the cephalosporin allergic regimen.  I also ordered normal saline 500 mL bolus.  I discussed the case by phone with Dr. Duane Boston who will admit.   [CF]  2252 Also updated patient and her sister.   [CF]  Tue Mar 23, 2018  0039 Differential diagnosis includes, but is not limited to, hip fracture/dislocation, pelvic fracture, ankle fracture, foot fracture, ankle sprain.  The hypoxemia could be from splinting or acute pain but also could be from pneumonia.  Lab work is notable for no evidence of urinary tract infection, essentially normal conference of metabolic panel, and  no leukocytosis on CBC.  However the chest x-ray is concerning for possible left lower lobe pneumonia in the setting the hypoxemia and tachypnea I think it is appropriate to treat empirically.  She has multiple pubic rami fractures and cannot tolerate any movement or weightbearing on her lower extremities.  She required 3 rounds of IV pain medication including that given by EMS and her pain was still intractable.  She will require orthopedics consultation, PT/OT, and probable rehab placement.  I discussed all this with the patient and her sister as well as the hospitalist.   [CF]    Clinical Course User Index [CF] Hinda Kehr, MD    ____________________________________________  FINAL CLINICAL IMPRESSION(S) / ED DIAGNOSES  Final diagnoses:  Closed fracture of right superior pubic ramus, initial encounter ([place])  Closed fracture of right inferior pubic ramus, initial encounter ([place])  Acute respiratory failure with hypoxemia (HCC)  Intractable pain     MEDICATIONS GIVEN DURING THIS VISIT:  Medications  morphine 2 MG/ML injection 2 mg (has no administration in time range)  sodium chloride 0.9 % bolus 500 mL (500 mLs Intravenous New Bag/Given 03/23/18 0036)  levofloxacin (LEVAQUIN) IVPB 750 mg (750 mg Intravenous New Bag/Given 03/23/18 0037)  vancomycin (VANCOCIN) IVPB 1000 mg/200 mL premix (has no administration in time range)  morphine 2 MG/ML injection 2 mg (2 mg Intravenous Given 03/22/18 2221)     ED Discharge Orders    None       Note:  This document was prepared using Dragon voice recognition software and may include unintentional dictation errors.  Hinda Kehr, MD 03/23/18 [phone]

## 2018-03-22 NOTE — ED Notes (Signed)
Ace wrap applied to left ankle as ordered; +PP, brisk cap refill, move well with good sensation

## 2018-03-22 NOTE — ED Notes (Signed)
Pt's sister at bedside; st she lives beside pt and she has been having recent falls because she won't use her walker; reports she went to check on her this evening and found that she had fallen in her BR

## 2018-03-22 NOTE — ED Triage Notes (Addendum)
Pt to room 25 via stretcher by EMS; pt from home post fall in BR with c/o right hip pain; 40mcg fentanyl admin en route

## 2018-03-23 ENCOUNTER — Other Ambulatory Visit: Payer: Self-pay

## 2018-03-23 LAB — MRSA PCR SCREENING: MRSA BY PCR: NEGATIVE

## 2018-03-23 LAB — BASIC METABOLIC PANEL
Anion gap: 8 (ref 5–15)
BUN: 32 mg/dL — AB (ref 8–23)
CO2: 26 mmol/L (ref 22–32)
Calcium: 8.6 mg/dL — ABNORMAL LOW (ref 8.9–10.3)
Chloride: 101 mmol/L (ref 98–111)
Creatinine, Ser: 1.13 mg/dL — ABNORMAL HIGH (ref 0.44–1.00)
GFR calc Af Amer: 47 mL/min — ABNORMAL LOW (ref >60)
GFR calc non Af Amer: 40 mL/min — ABNORMAL LOW (ref >60)
Glucose, Bld: 130 mg/dL — ABNORMAL HIGH (ref 70–99)
POTASSIUM: 4.4 mmol/L (ref 3.5–5.1)
Sodium: 135 mmol/L (ref 135–145)

## 2018-03-23 LAB — CBC
HCT: 28.5 % — ABNORMAL LOW (ref 35.0–47.0)
Hemoglobin: 9.7 g/dL — ABNORMAL LOW (ref 12.0–16.0)
MCH: 34 pg (ref 26.0–34.0)
MCHC: 34.1 g/dL (ref 32.0–36.0)
MCV: 99.7 fL (ref 80.0–100.0)
Platelets: 99 10*3/uL — ABNORMAL LOW (ref 150–440)
RBC: 2.86 MIL/uL — AB (ref 3.80–5.20)
RDW: 13.1 % (ref 11.5–14.5)
WBC: 6.8 10*3/uL (ref 3.6–11.0)

## 2018-03-23 LAB — GLUCOSE, CAPILLARY: GLUCOSE-CAPILLARY: 104 mg/dL — AB (ref 70–99)

## 2018-03-23 MED ORDER — LEVOFLOXACIN IN D5W 750 MG/150ML IV SOLN: 750.0000 mg | INTRAVENOUS | Status: DC

## 2018-03-23 MED ORDER — SERTRALINE HCL 50 MG PO TABS
50.0000 mg | ORAL_TABLET | Freq: Every day | ORAL | Status: DC
  Administered 2018-03-23 – 2018-03-25 (×3): 50 mg via ORAL
  Filled 2018-03-23 (×3): qty 1

## 2018-03-23 MED ORDER — ACETAMINOPHEN 650 MG RE SUPP: 650.0000 mg | Freq: Four times a day (QID) | RECTAL | Status: DC | PRN

## 2018-03-23 MED ORDER — ACETAMINOPHEN 325 MG PO TABS
650.0000 mg | ORAL_TABLET | Freq: Four times a day (QID) | ORAL | Status: DC | PRN
  Administered 2018-03-23 – 2018-03-25 (×3): 650 mg via ORAL
  Filled 2018-03-23 (×3): qty 2

## 2018-03-23 MED ORDER — ALIGN PO CAPS: 1.0000 | ORAL_CAPSULE | Freq: Every day | ORAL | Status: DC

## 2018-03-23 MED ORDER — TRAMADOL HCL 50 MG PO TABS
50.0000 mg | ORAL_TABLET | Freq: Three times a day (TID) | ORAL | Status: DC | PRN
  Administered 2018-03-23 – 2018-03-25 (×4): 50 mg via ORAL
  Filled 2018-03-23 (×4): qty 1

## 2018-03-23 MED ORDER — ONDANSETRON HCL 4 MG PO TABS: 4.0000 mg | ORAL_TABLET | Freq: Four times a day (QID) | ORAL | Status: DC | PRN

## 2018-03-23 MED ORDER — VANCOMYCIN HCL IN DEXTROSE 750-5 MG/150ML-% IV SOLN: 750.0000 mg | INTRAVENOUS | Status: DC

## 2018-03-23 MED ORDER — DOCUSATE SODIUM 100 MG PO CAPS
100.0000 mg | ORAL_CAPSULE | Freq: Two times a day (BID) | ORAL | Status: DC
  Administered 2018-03-23 – 2018-03-25 (×5): 100 mg via ORAL
  Filled 2018-03-23 (×5): qty 1

## 2018-03-23 MED ORDER — FUROSEMIDE 20 MG PO TABS
20.0000 mg | ORAL_TABLET | Freq: Every day | ORAL | Status: DC
  Administered 2018-03-24 – 2018-03-25 (×2): 20 mg via ORAL
  Filled 2018-03-23 (×2): qty 1

## 2018-03-23 MED ORDER — RISAQUAD PO CAPS
1.0000 | ORAL_CAPSULE | Freq: Every day | ORAL | Status: DC
  Administered 2018-03-23 – 2018-03-25 (×3): 1 via ORAL
  Filled 2018-03-23 (×3): qty 1

## 2018-03-23 MED ORDER — CALCIUM CARBONATE-VITAMIN D 500-200 MG-UNIT PO TABS
1.0000 | ORAL_TABLET | Freq: Three times a day (TID) | ORAL | Status: DC
  Administered 2018-03-23 – 2018-03-25 (×5): 1 via ORAL
  Filled 2018-03-23 (×6): qty 1

## 2018-03-23 MED ORDER — GLUCOSAMINE-CHONDROITIN 250-200 MG PO TABS: 1.0000 | ORAL_TABLET | Freq: Two times a day (BID) | ORAL | Status: DC

## 2018-03-23 MED ORDER — SODIUM CHLORIDE 0.9 % IV SOLN
INTRAVENOUS | Status: DC
  Administered 2018-03-23: 06:00:00 via INTRAVENOUS

## 2018-03-23 MED ORDER — MUPIROCIN 2 % EX OINT
1.0000 "application " | TOPICAL_OINTMENT | Freq: Two times a day (BID) | CUTANEOUS | Status: DC
  Administered 2018-03-23 – 2018-03-24 (×3): 1 via TOPICAL
  Filled 2018-03-23: qty 22

## 2018-03-23 MED ORDER — NYSTATIN 100000 UNIT/GM EX CREA
1.0000 "application " | TOPICAL_CREAM | Freq: Two times a day (BID) | CUTANEOUS | Status: DC
  Administered 2018-03-23 – 2018-03-25 (×4): 1 via TOPICAL
  Filled 2018-03-23: qty 15

## 2018-03-23 MED ORDER — FERROUS SULFATE 325 (65 FE) MG PO TABS
325.0000 mg | ORAL_TABLET | Freq: Every evening | ORAL | Status: DC
  Administered 2018-03-24: 325 mg via ORAL
  Filled 2018-03-23 (×2): qty 1

## 2018-03-23 MED ORDER — TRIAMCINOLONE ACETONIDE 0.5 % EX CREA
TOPICAL_CREAM | Freq: Two times a day (BID) | CUTANEOUS | Status: DC
  Administered 2018-03-23 – 2018-03-25 (×4): via TOPICAL
  Filled 2018-03-23: qty 15

## 2018-03-23 MED ORDER — VITAMIN D 1000 UNITS PO TABS
1000.0000 [IU] | ORAL_TABLET | Freq: Every day | ORAL | Status: DC
  Administered 2018-03-23 – 2018-03-25 (×3): 1000 [IU] via ORAL
  Filled 2018-03-23 (×3): qty 1

## 2018-03-23 MED ORDER — CALCIUM 600-200 MG-UNIT PO TABS: 1.0000 | ORAL_TABLET | Freq: Three times a day (TID) | ORAL | Status: DC

## 2018-03-23 MED ORDER — ONDANSETRON HCL 4 MG/2ML IJ SOLN: 4.0000 mg | Freq: Four times a day (QID) | INTRAMUSCULAR | Status: DC | PRN

## 2018-03-23 MED ORDER — BISACODYL 5 MG PO TBEC: 5.0000 mg | DELAYED_RELEASE_TABLET | Freq: Every day | ORAL | Status: DC | PRN

## 2018-03-23 MED ORDER — RAMIPRIL 5 MG PO CAPS
5.0000 mg | ORAL_CAPSULE | Freq: Every day | ORAL | Status: DC
  Administered 2018-03-24 – 2018-03-25 (×2): 5 mg via ORAL
  Filled 2018-03-23 (×3): qty 1

## 2018-03-23 MED ORDER — TRAZODONE HCL 50 MG PO TABS
25.0000 mg | ORAL_TABLET | Freq: Every evening | ORAL | Status: DC | PRN
  Administered 2018-03-24: 25 mg via ORAL
  Filled 2018-03-23: qty 1

## 2018-03-23 MED ORDER — HEPARIN SODIUM (PORCINE) 5000 UNIT/ML IJ SOLN
5000.0000 [IU] | Freq: Three times a day (TID) | INTRAMUSCULAR | Status: DC
  Administered 2018-03-23 – 2018-03-25 (×5): 5000 [IU] via SUBCUTANEOUS
  Filled 2018-03-23 (×6): qty 1

## 2018-03-23 MED ORDER — PANTOPRAZOLE SODIUM 40 MG PO TBEC
40.0000 mg | DELAYED_RELEASE_TABLET | Freq: Every day | ORAL | Status: DC
  Administered 2018-03-23 – 2018-03-25 (×3): 40 mg via ORAL
  Filled 2018-03-23 (×3): qty 1

## 2018-03-23 NOTE — Progress Notes (Signed)
PT Cancellation Note  Patient Details Name: Kayla Galloway MRN: [id number] DOB: 1920-11-05   Cancelled Treatment:     Order received. Chart reviewed. Pt has multiple pelvic fractures and is pending an ortho consult. PT plans to hold until after ortho determines a plan of care. PT will attempt session when pt is medically appropriate.  Yolonda Kida, SPT    Kayla Galloway 03/23/2018, 8:11 AM

## 2018-03-23 NOTE — Clinical Social Work Note (Signed)
Clinical Social Work Assessment  Patient Details  Name: Kayla Galloway MRN: [id number] Date of Birth: [birth year]  Date of referral:  03/23/18               Reason for consult:  Facility Placement                Permission sought to share information with:  Family Supports, Customer service manager Permission granted to share information::  Yes, Verbal Permission Granted  Name::     [facility] Sister [phone]  [phone] or [place],Carrie Sister [phone]  [phone]   Agency::  SNF admissions  Relationship::     Contact Information:     Housing/Transportation Living arrangements for the past 2 months:  Single Family Home Source of Information:  Patient Patient Interpreter Needed:  None Criminal Activity/Legal Involvement Pertinent to Current Situation/Hospitalization:  No - Comment as needed Significant Relationships:  Siblings, Other Family Members Lives with:  Self Do you feel safe going back to the place where you live?  No Need for family participation in patient care:  No (Coment)  Care giving concerns:  Patient feels she needs some short term rehab before she is able to return back home.   Social Worker assessment / plan: Patient is a 84 year old female who is alert and oriented x4.  Patient lives alone, however her sister cooks breakfast for her every morning, and her nephew assists with taking her to appointments.  Patient states she has a house cleaner that comes in once a month.  Patient states she has been to rehab before at [facility] and would like to return again if possible.  Patient was explained role of CSW and process of trying to find placement for SNF.  CSW also explained how insurance will pay for her stay, and reminded her what to expect at [facility] for rehab.  Patient stated she would like CSW to begin bed search in [place].  Patient did not have any other questions or concerns about going to SNF.  Employment status:   Retired Nurse, adult PT Recommendations:  [place] / Referral to community resources:  [place]  Patient/Family's Response to care:  Patient is in agreement of going to SNF for short term rehab.  Patient/Family's Understanding of and Emotional Response to Diagnosis, Current Treatment, and Prognosis:  Patient is hopeful that she will not have to be in SNF for very long, she is looking forward to working with therapy to make some progress so she can return back home.  Emotional Assessment Appearance:  Appears stated age Attitude/Demeanor/Rapport:    Affect (typically observed):  Appropriate, Calm Orientation:  Oriented to Self, Oriented to Place, Oriented to  Time, Oriented to Situation Alcohol / Substance use:  Not Applicable Psych involvement (Current and /or in the community):  No (Comment)  Discharge Needs  Concerns to be addressed:  Lack of Support, Care Coordination Readmission within the last 30 days:  No Current discharge risk:  Lack of support system, Lives alone Barriers to Discharge:  Continued Medical Work up   Anell Barr 03/23/2018, 5:22 PM

## 2018-03-23 NOTE — Consult Note (Signed)
Full consult note to follow.  Patient with bilateral pubic rami fractures and a left ankle sprain.  She may be WBAT on both lower extremities.  PT to evaluate.

## 2018-03-23 NOTE — Progress Notes (Signed)
Pharmacy Antibiotic Note  Kayla Galloway is a 84 y.o. female admitted on 03/22/2018 with pneumonia.  Pharmacy has been consulted for vancomycin and levofloxacin dosing.  Plan: DW 51kg  Vd 36L kei 0.025 hr-1  T1/2 28 hours Vancomycin 750 mg q 36 hours ordered with stacked dosing. Level before 5th dose. Goal trough 15-20.  Levaquin 750 mg q 48 hours ordered  Height: 5' (152.4 cm) Weight: 131 lb 1.6 oz (59.5 kg) IBW/kg (Calculated) : 45.5  Temp (24hrs), Avg:97.6 F (36.4 C), Min:97.6 F (36.4 C), Max:97.6 F (36.4 C)  Recent Labs  Lab 03/22/18 1928  WBC 10.4  CREATININE 1.13*    Estimated Creatinine Clearance: 24.6 mL/min (A) (by C-G formula based on SCr of 1.13 mg/dL (H)).    Allergies  Allergen Reactions  . Cefuroxime Axetil Rash    Pt states that she does fine with Keflex.    . Doxycycline Nausea Only and Other (See Comments)    Reaction:  Weight loss   . Advil [Ibuprofen] Swelling  . Celebrex [Celecoxib] Nausea Only  . Daypro [Oxaprozin] Other (See Comments)    Reaction:  Dizziness   . Etodolac Nausea Only  . Macrobid [employer] Macro] Other (See Comments)    Reaction:  Burning   . Penicillins Other (See Comments)    Reaction:  Burning   . Percocet [Oxycodone-Acetaminophen] Nausea Only and Swelling  . Tramadol Other (See Comments)    Reaction:  Unknown   . Valium [Diazepam] Other (See Comments)    Reaction:  Dizziness    . Neosporin [Neomycin-Bacitracin Zn-Polymyx] Rash  . Vicodin [Hydrocodone-Acetaminophen] Nausea Only    Antimicrobials this admission: Vancomycin, Levaquin 8/6  >>    >>   Dose adjustments this admission:   Microbiology results: 8/6 BCx: pending 8/6 MRSA PCR: pending      8/5 CXR: L base opacity 8/5 UA: (-) Thank you for allowing pharmacy to be a part of this patient's care.  Hasini Peachey S 03/23/2018 2:32 AM

## 2018-03-23 NOTE — Progress Notes (Signed)
PHARMACIST - PHYSICIAN ORDER COMMUNICATION  CONCERNING: P&T Medication Policy on Herbal Medications  DESCRIPTION:  This patient's order for:  Glucosamine-chondroitin  has been noted.  This product(s) is classified as an "herbal" or natural product. Due to a lack of definitive safety studies or FDA approval, nonstandard manufacturing practices, plus the potential risk of unknown drug-drug interactions while on inpatient medications, the Pharmacy and Therapeutics Committee does not permit the use of "herbal" or natural products of this type within [place].   ACTION TAKEN: The pharmacy department is unable to verify this order at this time  Please reevaluate patient's clinical condition at discharge and address if the herbal or natural product(s) should be resumed at that time.    

## 2018-03-23 NOTE — ED Notes (Signed)
Patient transported to 138 by this EDT.

## 2018-03-23 NOTE — Evaluation (Signed)
Physical Therapy Evaluation Patient Details Name: Kayla Galloway MRN: [id number] DOB: [birth year] Today's Date: 03/23/2018   History of Present Illness  Pt presents to hospital on 03/22/18 after a fall in which she stood up, got dizzy and fell over. Pt diagnosed with community aquired pneumonia, closed fracture of R superior ramus, B fx of inferior pubic ramus, L ankle sprain, acute respiratory failure with hypoxemia, and community aquired pneumonia. Pt has a past medical history that includes anemia, arthritis, depression, GERD, HTN, osteoporosis, and PVD.    Clinical Impression  Pt is a pleasant 84 year old female who was admitted for a closed fracture of R superior ramus, B fx of inferior pubic ramus, L ankle sprain, acute respiratory failure with hypoxemia, and community aquired pneumonia. Pt performs limited bed mobility with mod to max assist. Unable to fully assess transfers and ambulation with at this time due to pts uncontrolled pain. Pt demonstrates deficits with strength, mobility, and pain. Pt has equal bilateral and WFL sensation of B LEs. Pt complains of 9/10 pain at rest that elevates to 10/10 with any movement. Pt instructed in B LE there-ex with moderate assistance (see below for details). Pts O2 monitored throughout session and remained WNL on 1.5L via [place]. Pt could benefit from continued skilled therapy at this time to improve deficits toward PLOF. PT will continue to work with pt 7x/week while admitted. D/c recommendations at this time are SNF.      Follow Up Recommendations SNF    Equipment Recommendations  None recommended by PT(has all needed equipment)    Recommendations for Other Services       Precautions / Restrictions Precautions Precautions: None Restrictions Weight Bearing Restrictions: Yes RLE Weight Bearing: Weight bearing as tolerated LLE Weight Bearing: Weight bearing as tolerated      Mobility  Bed Mobility Overal bed mobility: Needs Assistance Bed  Mobility: (scooting)           General bed mobility comments: Pt mod/max assist to scoot up in bed. Pt assist with UEs appropriately. Not further assessed due to pain.  Transfers                 General transfer comment: Not assessed this visit due to pts uncontrolled pain  Ambulation/Gait             General Gait Details: Not assessed this visit due to pts uncontrolled pain  Stairs            Wheelchair Mobility    Modified Rankin (Stroke Patients Only)       Balance                                             Pertinent Vitals/Pain Pain Assessment: 0-10 Pain Score: 9  Pain Location: B pelvis Pain Descriptors / Indicators: Sharp;Aching Pain Intervention(s): Limited activity within patient's tolerance;Monitored during session;Patient requesting pain meds-RN notified    Home Living Family/patient expects to be discharged to:: Private residence Living Arrangements: Alone Available Help at Discharge: Family(sister lives next door and comes every morning to help pt) Type of Home: House Home Access: Stairs to enter Entrance Stairs-Rails: Left Entrance Stairs-Number of Steps: 2 Home Layout: One [place]: [place] - 2 wheels;Walker - 4 wheels;Cane - single point;Wheelchair - manual;Bedside commode      Prior Function Level of Independence: Independent with assistive device(s)  Comments: pt states that she was independent using RW however states that she does not leave her home very often. Pt reports that she falls frequently     Hand Dominance        Extremity/Trunk Assessment   Upper Extremity Assessment Upper Extremity Assessment: Generalized weakness(Grossly 3+/5 including shld flex, elbow flex/ext, grip)    Lower Extremity Assessment Lower Extremity Assessment: (Unable to fully assess due to pain)       Communication   Communication: No difficulties  Cognition Arousal/Alertness:  Awake/alert Behavior During Therapy: WFL for tasks assessed/performed Overall Cognitive Status: Impaired/Different from baseline(Unsure of baseline) Area of Impairment: Orientation                 Orientation Level: Disoriented to;Place;Time             General Comments: Pt A&O x 2. Pts sister that she has been more forgetful lately however unsure of true baseline      General Comments General comments (skin integrity, edema, etc.): Pt demonstrates large amounts of bruising on B UEs    Exercises Other Exercises Other Exercises: Pt instructed in B LE ther ex including with mod assist: SLR, hip abd x10 as well as ankle pumps, SAQ, and glut sets with verbal cuing x10.   Assessment/Plan    PT Assessment Patient needs continued PT services  PT Problem List Decreased strength;Decreased range of motion;Decreased activity tolerance;Decreased balance;Decreased mobility;Decreased coordination;Decreased cognition;Pain       PT Treatment Interventions DME instruction;Gait training;Stair training;Functional mobility training;Therapeutic activities;Therapeutic exercise;Balance training    PT Goals (Current goals can be found in the Care Plan section)  Acute Rehab PT Goals Patient Stated Goal: to stop having so much pain PT Goal Formulation: With patient Time For Goal Achievement: 04/06/18 Potential to Achieve Goals: Fair    Frequency 7X/week   Barriers to discharge        Co-evaluation               AM-PAC PT "6 Clicks" Daily Activity  Outcome Measure Difficulty turning over in bed (including adjusting bedclothes, sheets and blankets)?: Unable Difficulty moving from lying on back to sitting on the side of the bed? : Unable Difficulty sitting down on and standing up from a chair with arms (e.g., wheelchair, bedside commode, etc,.)?: Unable Help needed moving to and from a bed to chair (including a wheelchair)?: Total Help needed walking in hospital room?: Total Help  needed climbing 3-5 steps with a railing? : Total 6 Click Score: 6    End of Session   Activity Tolerance: Patient limited by pain Patient left: in bed;with call bell/phone within reach;with bed alarm set;with family/visitor present Nurse Communication: Other (comment)(pain meds) PT Visit Diagnosis: Unsteadiness on feet (R26.81);Other abnormalities of gait and mobility (R26.89);Repeated falls (R29.6);History of falling (Z91.81);Muscle weakness (generalized) (M62.81);Difficulty in walking, not elsewhere classified (R26.2);Pain Pain - Right/Left: (both) Pain - part of body: (pelvis)    Time: 3295-1884 PT Time Calculation (min) (ACUTE ONLY): 20 min   Charges:              Yolonda Kida, SPT   Bernadene Garside 03/23/2018, 3:11 PM

## 2018-03-23 NOTE — NC FL2 (Signed)
[place] LEVEL OF CARE SCREENING TOOL     IDENTIFICATION  Patient Name: Kayla Galloway Birthdate: Feb 07, 1921 Sex: female Admission Date (Current Location): 03/22/2018  [place] and [place] Number:  Engineering geologist and Address:  [facility], [address], [place], [place] [address]      Provider Number: [id number]  Attending Physician Name and Address:  Bettey Costa, MD  Relative Name and Phone Number:  Barrie Lyme Sister [phone]  [phone]     Current Level of Care: Hospital Recommended Level of Care: [place] Prior Approval Number:    Date Approved/Denied:   PASRR Number: [id number]  Discharge Plan: SNF    Current Diagnoses: Patient Active Problem List   Diagnosis Date Noted  . Fracture of oth parts of pelvis, init for clos fx ([place]) 03/22/2018  . Back pain 03/25/2017  . Hyponatremia 02/09/2017  . Rib fractures 02/04/2017  . GERD (gastroesophageal reflux disease) 02/04/2017  . Skin lesions 11/09/2016  . Bilateral lower extremity edema 11/04/2016  . S/P IVC filter 06/24/2016  . Weight loss 04/11/2014  . Stasis ulcer ([place]) 07/07/2013  . Anemia 06/28/2012  . Hypertension 06/28/2012  . Hypercholesterolemia 06/28/2012  . Renal insufficiency 06/28/2012  . Osteoporosis 06/28/2012    Orientation RESPIRATION BLADDER Height & Weight     Self, Situation, Place, Time  O2(1.5) Continent Weight: 120 lb (54.4 kg) Height:  5' (152.4 cm)  BEHAVIORAL SYMPTOMS/MOOD NEUROLOGICAL BOWEL NUTRITION STATUS      Continent Diet(Cardiac)  AMBULATORY STATUS COMMUNICATION OF NEEDS Skin   Limited Assist Verbally Normal                       Personal Care Assistance Level of Assistance  Bathing, Feeding, Dressing Bathing Assistance: Limited assistance Feeding assistance: Independent Dressing Assistance: Limited assistance     Functional Limitations Info  Hearing, Sight, Speech Sight Info:  Adequate Hearing Info: Adequate Speech Info: Adequate    SPECIAL CARE FACTORS FREQUENCY  PT (By licensed PT), OT (By licensed OT)     PT Frequency: 5x a week OT Frequency: 5x a week            Contractures Contractures Info: Not present    Additional Factors Info  Code Status, Allergies, Psychotropic Code Status Info: Full Code Allergies Info: CEFUROXIME AXETIL, DOXYCYCLINE, ADVIL IBUPROFEN, CELEBREX CELECOXIB, DAYPRO OXAPROZIN, ETODOLAC, MACROBID NITROFURANTOIN MONOHYD MACRO, PENICILLINS, PERCOCET OXYCODONE-ACETAMINOPHEN, TRAMADOL, VALIUM DIAZEPAM, NEOSPORIN NEOMYCIN-BACITRACIN ZN-POLYMYX, VICODIN HYDROCODONE-ACETAMINOPHEN  Psychotropic Info: sertraline (ZOLOFT) tablet 50 mg          Current Medications (03/23/2018):  This is the current hospital active medication list Current Facility-Administered Medications  Medication Dose Route Frequency Provider Last Rate Last Dose  . acetaminophen (TYLENOL) tablet 650 mg  650 mg Oral Q6H PRN Amelia Jo, MD   650 mg at 03/23/18 1451   Or  . acetaminophen (TYLENOL) suppository 650 mg  650 mg Rectal Q6H PRN Amelia Jo, MD      . acidophilus (RISAQUAD) capsule 1 capsule  1 capsule Oral Daily Amelia Jo, MD   1 capsule at 03/23/18 1037  . bisacodyl (DULCOLAX) EC tablet 5 mg  5 mg Oral Daily PRN Amelia Jo, MD      . calcium-vitamin D (OSCAL WITH D) 500-200 MG-UNIT per tablet 1 tablet  1 tablet Oral TID WC Amelia Jo, MD   1 tablet at 03/23/18 1037  . cholecalciferol (VITAMIN D) tablet 1,000 Units  1,000 Units Oral Daily Amelia Jo,  MD   1,000 Units at 03/23/18 1037  . docusate sodium (COLACE) capsule 100 mg  100 mg Oral BID Amelia Jo, MD   100 mg at 03/23/18 1037  . ferrous sulfate tablet 325 mg  325 mg Oral QPM Amelia Jo, MD      . furosemide (LASIX) tablet 20 mg  20 mg Oral Daily Amelia Jo, MD      . heparin injection 5,000 Units  5,000 Units Subcutaneous Q8H Amelia Jo, MD      . Derrill Memo ON 03/25/2018]  levofloxacin (LEVAQUIN) IVPB 750 mg  750 mg Intravenous Q48H Hinda Kehr, MD      . morphine 2 MG/ML injection 2 mg  2 mg Intravenous Q1H PRN Hinda Kehr, MD   2 mg at 03/23/18 0348  . mupirocin ointment (BACTROBAN) 2 % 1 application  1 application Topical BID Amelia Jo, MD      . nystatin cream (MYCOSTATIN) 1 application  1 application Topical BID Amelia Jo, MD      . ondansetron [facility]) tablet 4 mg  4 mg Oral Q6H PRN Amelia Jo, MD       Or  . ondansetron [facility]) injection 4 mg  4 mg Intravenous Q6H PRN Amelia Jo, MD      . pantoprazole (PROTONIX) EC tablet 40 mg  40 mg Oral Daily Amelia Jo, MD   40 mg at 03/23/18 1037  . ramipril (ALTACE) capsule 5 mg  5 mg Oral Daily Amelia Jo, MD      . sertraline (ZOLOFT) tablet 50 mg  50 mg Oral Daily Amelia Jo, MD   50 mg at 03/23/18 1037  . traMADol (ULTRAM) tablet 50 mg  50 mg Oral Q8H PRN Amelia Jo, MD   50 mg at 03/23/18 1037  . traZODone (DESYREL) tablet 25 mg  25 mg Oral QHS PRN Amelia Jo, MD      . triamcinolone cream (KENALOG) 0.5 %   Topical BID Amelia Jo, MD         Discharge Medications: Please see discharge summary for a list of discharge medications.  Relevant Imaging Results:  Relevant Lab Results:   Additional Information SSN [id number]  Ross Ludwig, [place]

## 2018-03-23 NOTE — Progress Notes (Signed)
[place] [facility] [place] NAME: Kayla Galloway    MR#:  [id number]  DATE OF BIRTH:  July 12, 1941  SUBJECTIVE:   Patient's pain is a little bit better.  REVIEW OF SYSTEMS:    Review of Systems  Constitutional: Negative for fever, chills weight loss HENT: Negative for ear pain, nosebleeds, congestion, facial swelling, rhinorrhea, neck pain, neck stiffness and ear discharge.   Respiratory: Negative for cough, shortness of breath, wheezing  Cardiovascular: Negative for chest pain, palpitations and leg swelling.  Gastrointestinal: Negative for heartburn, abdominal pain, vomiting, diarrhea or consitpation Genitourinary: Negative for dysuria, urgency, frequency, hematuria Musculoskeletal: Negative for back pain  Positive pelvic pain   neurological: Negative for dizziness, seizures, syncope, focal weakness,  numbness and headaches.  Hematological: Does not bruise/bleed easily.  Psychiatric/Behavioral: Negative for hallucinations, confusion, dysphoric mood    Tolerating Diet: yes      DRUG ALLERGIES:   Allergies  Allergen Reactions  . Cefuroxime Axetil Rash    Pt states that she does fine with Keflex.    . Doxycycline Nausea Only and Other (See Comments)    Reaction:  Weight loss   . Advil [Ibuprofen] Swelling  . Celebrex [Celecoxib] Nausea Only  . Daypro [Oxaprozin] Other (See Comments)    Reaction:  Dizziness   . Etodolac Nausea Only  . Macrobid [employer] Macro] Other (See Comments)    Reaction:  Burning   . Penicillins Other (See Comments)    Reaction:  Burning   . Percocet [Oxycodone-Acetaminophen] Nausea Only and Swelling  . Tramadol Other (See Comments)    Reaction:  Unknown   . Valium [Diazepam] Other (See Comments)    Reaction:  Dizziness    . Neosporin [Neomycin-Bacitracin Zn-Polymyx] Rash  . Vicodin [Hydrocodone-Acetaminophen] Nausea Only    VITALS:  Blood pressure (!) 109/52, pulse 75, temperature 97.7  F (36.5 C), temperature source Oral, resp. rate 18, height 5' (1.524 m), weight 120 lb (54.4 kg), SpO2 97 %.  PHYSICAL EXAMINATION:  Constitutional: Appears frail no distress. HENT: Normocephalic. Marland Kitchen Oropharynx is clear and moist.  Eyes: Conjunctivae and EOM are normal. PERRLA, no scleral icterus.  Neck: Normal ROM. Neck supple. No JVD. No tracheal deviation. CVS: RRR, S1/S2 +, no murmurs, no gallops, no carotid bruit.  Pulmonary: Effort and breath sounds normal, no stridor, rhonchi, wheezes, rales.  Abdominal: Soft. BS +,  no distension, tenderness, rebound or guarding.  Musculoskeleta left ankle with some mild swelling no tenderness at joint line Patient having difficulty lifting legs due to pelvic pain Neuro: Alert. CN 2-12 grossly intact. No focal deficits. Skin: Skin is warm and dry. No rash noted. Bruising noted on arms and legs Psychiatric: Normal mood and affect.      LABORATORY PANEL:   CBC Recent Labs  Lab 03/23/18 0529  WBC 6.8  HGB 9.7*  HCT 28.5*  PLT 99*   ------------------------------------------------------------------------------------------------------------------  Chemistries  Recent Labs  Lab 03/22/18 1928 03/23/18 0529  NA 135 135  K 4.2 4.4  CL 100 101  CO2 24 26  GLUCOSE 134* 130*  BUN 32* 32*  CREATININE 1.13* 1.13*  CALCIUM 9.2 8.6*  AST 30  --   ALT 19  --   ALKPHOS 33*  --   BILITOT 0.7  --    ------------------------------------------------------------------------------------------------------------------  Cardiac Enzymes Recent Labs  Lab 03/22/18 1928  TROPONINI <0.03   ------------------------------------------------------------------------------------------------------------------  RADIOLOGY:  Dg Ankle Complete Left  Result Date: 03/22/2018 CLINICAL DATA:  Patient fell  today and presents with left ankle pain. EXAM: LEFT ANKLE COMPLETE - 3+ VIEW COMPARISON:  02/03/2017 FINDINGS: There is no evidence of fracture,  dislocation, or joint effusion. There is no evidence of arthropathy or other focal bone abnormality. Mild soft tissue induration and swelling is noted of the included left leg and about the malleoli. Minimal enthesopathy off the dorsum and plantar aspect of the calcaneus. Vascular phleboliths and calcifications are noted as before along the tibial and dorsalis pedis arteries in particular. IMPRESSION: Soft tissue swelling of the left ankle without acute fracture or joint dislocation. Electronically Signed   By: Ashley Royalty M.D.   On: 03/22/2018 20:48   Dg Chest Portable 1 View  Result Date: 03/22/2018 CLINICAL DATA:  Acute onset of shortness of breath and hypoxemia. EXAM: PORTABLE CHEST 1 VIEW COMPARISON:  Chest radiograph performed 03/25/2017 FINDINGS: The lungs are mildly hypoexpanded. Mild left basilar airspace opacity may reflect atelectasis or mild pneumonia. There is no evidence of pleural effusion or pneumothorax. The cardiomediastinal silhouette is within normal limits. No acute osseous abnormalities are seen. The patient is status post vertebroplasty at multiple levels along the thoracic spine. IMPRESSION: Lungs mildly hypoexpanded. Mild left basilar airspace opacity may reflect atelectasis or mild pneumonia. Electronically Signed   By: Garald Balding M.D.   On: 03/22/2018 22:14   Dg Hip Unilat W Or Wo Pelvis 2-3 Views Right  Result Date: 03/22/2018 CLINICAL DATA:  Patient fell today complaining of right hip and ankle pain. EXAM: DG HIP (WITH OR WITHOUT PELVIS) 2-3V RIGHT COMPARISON:  08/05/2012 CT FINDINGS: Acute bilateral inferior pubic and right superior pubic rami fractures are noted with a suspicious lucency also involving the left superior pubic ramus that may represent a nondisplaced pubic ramus fracture is well. Slight caudal displacement of the right parasymphysis secondary to fracture is noted. Vertebral augmentation cement is identified within the L4 and L5 vertebral bodies. Adjacent right  IVC filter is in place. No proximal femoral fracture is noted with left femoral nail fixation identified. No joint dislocation of either hip. IMPRESSION: 1. Acute bilateral inferior pubic and right superior pubic rami fractures are noted with equivocal left superior pubic ramus fracture as well. 2. Vertebral augmentation L4 and L5. 3. No hip fracture. Electronically Signed   By: Ashley Royalty M.D.   On: 03/22/2018 20:45     ASSESSMENT AND PLAN:   84 year old female with history of essential hypertension who presented after mechanical fall.  1.  Acute hypoxic respiratory failure in the setting of community-acquired pneumonia: Continue Levaquin  2.  Community acquired pneumonia: Continue Levaquin and treat for total 5 days.  3.  Acute bilateral inferior pubic and right superior pubic rami fracture status post mechanical fall: Orthopedic and PT consultation pending This is nonoperative and patient will need supportive care including pain medications and likely will need skilled nursing facility upon discharge  4.  Essential hypertension: Continue ramipril  5.  Depression: Continue Zoloft and Lasix  6.  Anemia of chronic disease: Continue ferrous sulfate      Management plans discussed with the patient and she is in agreement.  CODE STATUS:  full  TOTAL TIME TAKING CARE OF THIS PATIENT: 30 minutes.     POSSIBLE D/C 1 to 2 days, DEPENDING ON CLINICAL CONDITION.   Camarie Mctigue M.D on 03/23/2018 at 9:26 AM  Between 7am to 6pm - Pager - [phone] After 6pm go to [email or web address] - Proofreader  [employer]  [phone]  CC: Primary care physician; [name], MD  Note: This dictation was prepared with Dragon dictation along with smaller phrase technology. Any transcriptional errors that result from this process are unintentional.

## 2018-03-23 NOTE — Consult Note (Signed)
ORTHOPAEDIC CONSULTATION  REQUESTING PHYSICIAN: Bettey Costa, MD  Chief Complaint:  Bilateral hip/pelvis pain and right ankle pain  HPI: Patient was seen around 12:30 PM this afternoon with her sister at the bedside. Kayla Galloway is a 84 y.o. female who sustained a fall yesterday at home.  Patient lives independently and her sister lives next door and looks in on her.  Patient was brought to [facility] ER after her fall.  She is diagnosed with bilateral pubic rami fractures.  Orthopaedics is consulted for her injuries.  Past Medical History:  Diagnosis Date  . Anemia   . Arthritis   . Cancer ([place])    skin ca lesion of scalp- removed  . Depression   . DVT (deep venous thrombosis), left    bilateral, IVC filter 2010  . GERD (gastroesophageal reflux disease)   . Gout   . Hypercholesterolemia   . Hyperkalemia   . Hypertension    Dr. [name]  . Nephrolithiasis   . Obesity   . Osteoporosis   . Peripheral vascular disease ([place])   . 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 Spider veins    Past Surgical History:  Procedure Laterality Date  . CARDIAC CATHETERIZATION     2010 Does not see a cardiac  doctor  . Colonsopy    . DENTAL SURGERY    . ECTOPIC PREGNANCY SURGERY    . EYE SURGERY Bilateral 2011,2012   cataracts  . FRACTURE SURGERY  2009   hip, rod from knee to hip  . HEMORRHOID SURGERY    . IVC filter Left    pt states it has turned side ways and could not be removed.  Marland Kitchen KYPHOPLASTY  09/03/2012   per patient, she has had kypho x 2:  Surgeon: Winfield Cunas, MD;  Location: [place];  Service: Neurosurgery;  Laterality: N/A;  Thoracic eight Kyphoplasty  . OPEN REDUCTION INTERNAL FIXATION (ORIF) DISTAL RADIAL FRACTURE Left 09/06/2015   Procedure: OPEN REDUCTION INTERNAL FIXATION (ORIF) DISTAL RADIAL FRACTURE;  Surgeon: Hessie Knows, MD;  Location: [facility] ORS;  Service:  Orthopedics;  Laterality: Left;  . RIGHT OOPHORECTOMY     partial-  . SKIN CANCER EXCISION     top of head  . TONSILLECTOMY     age 27  . VERTEBROPLASTY  12/31/2011   Procedure: VERTEBROPLASTY;  Surgeon: Winfield Cunas, MD;  Location: [place] [facility];  Service: Neurosurgery;  Laterality: N/A;  Thoracic eleven vertebroplasty   Social History   Socioeconomic History  . Marital status: Widowed    Spouse name: Not on file  . Number of children: 0  . Years of education: Not on file  . Highest education level: Not on file  Occupational History  . Not on file  Social Needs  . Financial resource strain: Not on file  . Food insecurity:    Worry: Not on file    Inability: Not on file  . Transportation needs:    Medical: Not on file    Non-medical: Not on file  Tobacco Use  . Smoking status: Never Smoker  . Smokeless tobacco: Never Used  Substance and Sexual Activity  . Alcohol use: No    Alcohol/week: 0.0 oz  . Drug use: No  . Sexual activity: Never  Lifestyle  . Physical activity:    Days per week: Not on file  Minutes per session: Not on file  . Stress: Not on file  Relationships  . Social connections:    Talks on phone: Not on file    Gets together: Not on file    Attends religious service: Not on file    Active member of club or organization: Not on file    Attends meetings of clubs or organizations: Not on file    Relationship status: Not on file  Other Topics Concern  . Not on file  Social History Narrative  . Not on file   Family History  Problem Relation Age of Onset  . Heart disease Father        myocardial infarction  . Heart disease Brother        myocardial infarction  . Lymphoma Sister   . Anesthesia problems Neg Hx   . Breast cancer Neg Hx   . Colon cancer Neg Hx    Allergies  Allergen Reactions  . Cefuroxime Axetil Rash    Pt states that she does fine with Keflex.    . Doxycycline Nausea Only and Other (See Comments)    Reaction:  Weight loss    . Advil [Ibuprofen] Swelling  . Celebrex [Celecoxib] Nausea Only  . Daypro [Oxaprozin] Other (See Comments)    Reaction:  Dizziness   . Etodolac Nausea Only  . Macrobid [employer] Macro] Other (See Comments)    Reaction:  Burning   . Penicillins Other (See Comments)    Reaction:  Burning   . Percocet [Oxycodone-Acetaminophen] Nausea Only and Swelling  . Tramadol Other (See Comments)    Reaction:  Unknown   . Valium [Diazepam] Other (See Comments)    Reaction:  Dizziness    . Neosporin [Neomycin-Bacitracin Zn-Polymyx] Rash  . Vicodin [Hydrocodone-Acetaminophen] Nausea Only   Prior to Admission medications   Medication Sig Start Date End Date Taking? Authorizing Provider  acetaminophen (TYLENOL) 650 MG CR tablet Take 650 mg by mouth every 8 (eight) hours as needed for pain.    Yes [provider]  bifidobacterium infantis (ALIGN) capsule Take 1 capsule by mouth daily. 05/22/14  Yes [name], MD  Calcium 600-200 MG-UNIT tablet Take 1 tablet by mouth 3 (three) times daily with meals.   Yes [provider]  cholecalciferol (VITAMIN D) 1000 units tablet Take 1,000 Units by mouth daily.   Yes [provider]  docusate sodium (COLACE) 100 MG capsule Take 2 capsules (200 mg total) by mouth 2 (two) times daily. 02/10/17  Yes Vaughan Basta, MD  ferrous sulfate 325 (65 FE) MG tablet Take 325 mg by mouth every evening.   Yes [provider]  furosemide (LASIX) 20 MG tablet Take 1 tablet (20 mg total) by mouth daily. 11/13/17  Yes [name], MD  Glucosamine-Chondroitin 250-200 MG TABS Take 1 tablet by mouth 2 (two) times daily.    Yes [provider]  mupirocin ointment (BACTROBAN) 2 % Apply 1 application topically 2 (two) times daily. 09/15/17  Yes [name], MD  nystatin cream (MYCOSTATIN) Apply 1 application topically 2 (two) times daily. Apply under breast 12/30/17  Yes [name], MD  omeprazole (PRILOSEC) 20  MG capsule TAKE 1 CAPSULE BY MOUTH ONCE DAILY 07/06/17  Yes [name], MD  phenylephrine (,USE FOR PREPARATION-H,) 0.25 % suppository Place 1 suppository rectally 2 (two) times daily.   Yes [provider]  polyethylene glycol (MIRALAX / GLYCOLAX) packet Take 17 g by mouth daily as needed for mild constipation. 02/10/17  Yes Vaughan Basta, MD  ramipril (ALTACE) 5 MG capsule TAKE 1 CAPSULE BY MOUTH ONCE DAILY 09/10/17  Yes [name], MD  sertraline (ZOLOFT) 50 MG tablet TAKE 1 TABLET BY MOUTH ONCE DAILY 07/06/17  Yes [name], MD  traMADol (ULTRAM) 50 MG tablet Take 1 tablet (50 mg total) by mouth every 6 (six) hours as needed for moderate pain. 02/10/17  Yes Vaughan Basta, MD  triamcinolone cream (KENALOG) 0.5 % Apply topically 2 (two) times daily. 12/30/17  Yes [name], MD   Dg Ankle Complete Left  Result Date: 03/22/2018 CLINICAL DATA:  Patient fell today and presents with left ankle pain. EXAM: LEFT ANKLE COMPLETE - 3+ VIEW COMPARISON:  02/03/2017 FINDINGS: There is no evidence of fracture, dislocation, or joint effusion. There is no evidence of arthropathy or other focal bone abnormality. Mild soft tissue induration and swelling is noted of the included left leg and about the malleoli. Minimal enthesopathy off the dorsum and plantar aspect of the calcaneus. Vascular phleboliths and calcifications are noted as before along the tibial and dorsalis pedis arteries in particular. IMPRESSION: Soft tissue swelling of the left ankle without acute fracture or joint dislocation. Electronically Signed   By: Ashley Royalty M.D.   On: 03/22/2018 20:48   Dg Chest Portable 1 View  Result Date: 03/22/2018 CLINICAL DATA:  Acute onset of shortness of breath and hypoxemia. EXAM: PORTABLE CHEST 1 VIEW COMPARISON:  Chest radiograph performed 03/25/2017 FINDINGS: The lungs are mildly hypoexpanded. Mild left basilar airspace opacity may reflect atelectasis or mild pneumonia.  There is no evidence of pleural effusion or pneumothorax. The cardiomediastinal silhouette is within normal limits. No acute osseous abnormalities are seen. The patient is status post vertebroplasty at multiple levels along the thoracic spine. IMPRESSION: Lungs mildly hypoexpanded. Mild left basilar airspace opacity may reflect atelectasis or mild pneumonia. Electronically Signed   By: Garald Balding M.D.   On: 03/22/2018 22:14   Dg Hip Unilat W Or Wo Pelvis 2-3 Views Right  Result Date: 03/22/2018 CLINICAL DATA:  Patient fell today complaining of right hip and ankle pain. EXAM: DG HIP (WITH OR WITHOUT PELVIS) 2-3V RIGHT COMPARISON:  08/05/2012 CT FINDINGS: Acute bilateral inferior pubic and right superior pubic rami fractures are noted with a suspicious lucency also involving the left superior pubic ramus that may represent a nondisplaced pubic ramus fracture is well. Slight caudal displacement of the right parasymphysis secondary to fracture is noted. Vertebral augmentation cement is identified within the L4 and L5 vertebral bodies. Adjacent right IVC filter is in place. No proximal femoral fracture is noted with left femoral nail fixation identified. No joint dislocation of either hip. IMPRESSION: 1. Acute bilateral inferior pubic and right superior pubic rami fractures are noted with equivocal left superior pubic ramus fracture as well. 2. Vertebral augmentation L4 and L5. 3. No hip fracture. Electronically Signed   By: Ashley Royalty M.D.   On: 03/22/2018 20:45    Positive ROS: All other systems have been reviewed and were otherwise negative with the exception of those mentioned in the HPI and as above.  Physical Exam: General: Alert, no acute distress   MUSCULOSKELETAL: Right hip/pelvis:  Skin intact.  No erythema or swelling.  +tenderness over the pubis.  No pain with log rolling.  NVI.  Intact motor function distally.  Left hip/pelvis: skin intact.  No erythema or swelling.  +tenderness over the  pubis.  No pain with log rolling.  NVI.  Intact motor function distally.  Right  ankle:  Ace wrapped.  Skin intact.  + tenderness over the lateral ankle.  No deformity.  Mild swelling.   NVI.  No significant pain with ankle ROM.  No ankle instability.  Assessment: Right superior and inferior rami fractures Left inferior rami fractures Left ankle sprain  Plan: I reviewed the patient's xrays.  She has rami fractures on the right and left.  These are non-displaced.  They appear stable.  I recommend non-op management.   Monitor hemoglobin and hematocrit for signs of bleeding/hematoma of the pelvis.  PT evaluation for safety and function. The patient's left ankle injury complicates her picture and may impact her ability to stand and ambulate.  Patient is at a significant fall risk.  Patient would benefit from a skilled nursing facility upon discharge.      Thornton Park, MD    03/23/2018 5:30 PM

## 2018-03-23 NOTE — Progress Notes (Signed)
OT Cancellation Note  Patient Details Name: Kayla Galloway MRN: [id number] DOB: [birth year]   Cancelled Treatment:    Reason Eval/Treat Not Completed: Other (comment). Order received, chart reviewed. Pt pending orthopedic consult. Pt has multiple pelvic fractures. Will hold OT evaluation and re-attempt at later date/time pending ortho consult to further inform plan of care.   Jeni Salles, MPH, MS, OTR/L ascom [phone] 03/23/18, 1:41 PM

## 2018-03-24 ENCOUNTER — Telehealth: Payer: Self-pay | Admitting: *Deleted

## 2018-03-24 LAB — GLUCOSE, CAPILLARY: Glucose-Capillary: 97 mg/dL (ref 70–99)

## 2018-03-24 MED ORDER — TAMSULOSIN HCL 0.4 MG PO CAPS: 0.4000 mg | ORAL_CAPSULE | Freq: Every day | ORAL | 0 refills | Status: DC

## 2018-03-24 MED ORDER — TRAMADOL HCL 50 MG PO TABS: 50.0000 mg | ORAL_TABLET | Freq: Four times a day (QID) | ORAL | 0 refills | Status: AC | PRN

## 2018-03-24 MED ORDER — BISACODYL 10 MG RE SUPP
10.0000 mg | Freq: Every day | RECTAL | Status: DC
  Administered 2018-03-24 – 2018-03-25 (×2): 10 mg via RECTAL
  Filled 2018-03-24 (×2): qty 1

## 2018-03-24 MED ORDER — LEVOFLOXACIN 750 MG PO TABS: 750.0000 mg | ORAL_TABLET | ORAL | 0 refills | Status: AC

## 2018-03-24 MED ORDER — PHENOL 1.4 % MT LIQD
1.0000 | OROMUCOSAL | Status: DC | PRN
  Administered 2018-03-24: 1 via OROMUCOSAL
  Filled 2018-03-24: qty 177

## 2018-03-24 MED ORDER — SENNA 8.6 MG PO TABS
1.0000 | ORAL_TABLET | Freq: Every day | ORAL | Status: DC
  Administered 2018-03-24 – 2018-03-25 (×2): 8.6 mg via ORAL
  Filled 2018-03-24 (×2): qty 1

## 2018-03-24 MED ORDER — LEVOFLOXACIN 500 MG PO TABS
750.0000 mg | ORAL_TABLET | ORAL | Status: DC
  Administered 2018-03-25: 750 mg via ORAL
  Filled 2018-03-24: qty 2

## 2018-03-24 NOTE — Progress Notes (Signed)
Clinical Education officer, museum (CSW) attempted to contact [facility] however their system was down and no information could be looked up. Per [facility] admissions coordinator at [facility] SN[facility] authorization is still pending. Patient and her sister [name] are aware of above. RN and MD are aware of above.   [employer], LCSW [phone]

## 2018-03-24 NOTE — Telephone Encounter (Signed)
Thank you.  Let me know if I need to do anything.   

## 2018-03-24 NOTE — Progress Notes (Signed)
Patient resting sat room air 91%, as  PT attempted to transfer patient into the chair sat drop 85 % on room air , patient placed on 2 L 02 sat 95%.

## 2018-03-24 NOTE — Progress Notes (Signed)
Clinical Education officer, museum (CSW) presented bed offers to patient and her sister [name]. They chose [facility]. Per [facility] admissions coordinator at [facility] she will start [facility] authorization today.   [employer], LCSW [phone]

## 2018-03-24 NOTE — Telephone Encounter (Signed)
Patient DC to [facility]

## 2018-03-24 NOTE — Progress Notes (Signed)
PHARMACIST - PHYSICIAN COMMUNICATION DR:   Benjie Karvonen CONCERNING: Antibiotic IV to Oral Route Change Policy  RECOMMENDATION: This patient is receiving Levifloxacin by the intravenous route.  Based on criteria approved by the Pharmacy and Therapeutics Committee, the antibiotic(s) is/are being converted to the equivalent oral dose form(s).   DESCRIPTION: These criteria include:  Patient being treated for a respiratory tract infection, urinary tract infection, cellulitis or clostridium difficile associated diarrhea if on metronidazole  The patient is not neutropenic and does not exhibit a GI malabsorption state  The patient is eating (either orally or via tube) and/or has been taking other orally administered medications for a least 24 hours  The patient is improving clinically and has a Tmax < 100.5  If you have questions about this conversion, please contact the Pharmacy Department  []   [phone] )  [facility] [x]   [phone] )  [facility] []   [phone] )  [facility] []   [phone] )  [facility] []   [phone] )  [place]. [place], [place].D., BCPS Clinical Pharmacist 03/24/2018 07:41

## 2018-03-24 NOTE — Evaluation (Signed)
Occupational Therapy Evaluation Patient Details Name: Kayla Galloway MRN: [id number] DOB: 25-Nov-1920 Today's Date: 03/24/2018    History of Present Illness Pt presents to hospital on 03/22/18 after a fall in which she stood up, got dizzy and fell over. Pt diagnosed with community aquired pneumonia, closed fracture of R superior ramus, B fx of inferior pubic ramus, L ankle sprain, acute respiratory failure with hypoxemia, and community aquired pneumonia. Pt has a past medical history that includes anemia, arthritis, depression, GERD, HTN, osteoporosis, and PVD.   Clinical Impression   Pt seen for OT evaluation this date. Prior to hospital admission, pt was living independently, ambulating with a RW, and endorses multiple falls. Sister lives next door. Currently pt demonstrates impairments in strength, activity tolerance, cognition (different from baseline, per sister), pain in B hips, and balance requiring mod-max assist for bathing, dressing, and toileting and all aspects of mobility. Pt instructed in pursed lip breathing to minimize SOB with verbal and visual cues required for pt to return demo. Pt/family instructed in precautions to take to minimize risk of aspiration including HOB positioning, taking small bites, minimizing conversation and environmental stimuli while eating, and OT provided set up to cut meat, and prepare tea. Sister expressed concern regarding pt previously getting choked up on food. RN notified. Pt may benefit from SLP swallow evaluation. Pt would benefit from skilled OT to address noted impairments and functional limitations (see below for any additional details) in order to maximize safety and independence while minimizing falls risk and caregiver burden.  Upon hospital discharge, recommend pt discharge to [place].     Follow Up Recommendations  SNF    Equipment Recommendations  Other (comment)(TBD)    Recommendations for Other Services Speech consult     Precautions /  Restrictions Precautions Precautions: Fall Restrictions Weight Bearing Restrictions: Yes RLE Weight Bearing: Weight bearing as tolerated LLE Weight Bearing: Weight bearing as tolerated      Mobility Bed Mobility     General bed mobility comments: deferred 2/2 confusion and recently mobilized with PT, pt fearful of pain with movement      Balance                           ADL either performed or assessed with clinical judgement   ADL Overall ADL's : Needs assistance/impaired Eating/Feeding: Bed level;Set up;Supervision/ safety;Cueing for safety Eating/Feeding Details (indicate cue type and reason): long sitting in bed with HOB elevated to maximize upright positioning, cues for small bites, meats cut and tea prepared for pt, OT educated family in how to support pt with cues for small bites, chewing well, and not engaging in conversation while chewing to minimize risk of aspiration Grooming: Bed level;Set up;Supervision/safety   Upper Body Bathing: Bed level;Moderate assistance   Lower Body Bathing: Bed level;Maximal assistance   Upper Body Dressing : Bed level;Moderate assistance   Lower Body Dressing: Bed level;Maximal assistance                       Vision Patient Visual Report: No change from baseline       Perception     Praxis      Pertinent Vitals/Pain Pain Assessment: Faces Pain Score: 4 (4/10 at rest, increases during ther-ex and transfer) Faces Pain Scale: Hurts little more Pain Location: B pelvis with movement Pain Descriptors / Indicators: Grimacing;Guarding Pain Intervention(s): Limited activity within patient's tolerance;Monitored during session;Repositioned     Hand Dominance  Left   Extremity/Trunk Assessment Upper Extremity Assessment Upper Extremity Assessment: Generalized weakness(Grossly 3+/5 including shld flex, elbow flex/ext, 4/5 grip bilaterally)   Lower Extremity Assessment Lower Extremity Assessment: Defer to PT  evaluation;Difficult to assess due to impaired cognition(unable to fully assess 2/2 pain)       Communication Communication Communication: No difficulties   Cognition Arousal/Alertness: Awake/alert Behavior During Therapy: WFL for tasks assessed/performed Overall Cognitive Status: Impaired/Different from baseline Area of Impairment: Orientation;Memory                 Orientation Level: Disoriented to;Time   Memory: Decreased short-term memory         General Comments: pt able to follow simple commands well   General Comments  significant bruising to BUE     Exercises Exercises: Other exercises Other Exercises Other Exercises: Pt instructed in PLB to support breathing and minimize SOB with verbal and visual cues to return demo   Shoulder Instructions      Home Living Family/patient expects to be discharged to:: Private residence Living Arrangements: Alone Available Help at Discharge: Family(sister lives next door and comes every morning to help pt) Type of Home: House Home Access: Stairs to enter [employer] of Steps: 2 Entrance Stairs-Rails: Left Home Layout: One level               Home Equipment: Environmental consultant - 2 wheels;Walker - 4 wheels;Cane - single point;Wheelchair - manual;Bedside commode          Prior Functioning/Environment Level of Independence: Independent with assistive device(s)        Comments: pt states that she was independent using RW however states that she does not leave her home very often. Pt reports that she falls frequently        OT Problem List: Impaired balance (sitting and/or standing);Decreased safety awareness;Pain;Cardiopulmonary status limiting activity;Decreased cognition;Decreased activity tolerance;Decreased range of motion;Decreased strength;Decreased knowledge of use of DME or AE      OT Treatment/Interventions: Self-care/ADL training;Balance training;Therapeutic exercise;Therapeutic activities;DME and/or  AE instruction;Patient/family education;Cognitive remediation/compensation    OT Goals(Current goals can be found in the care plan section) Acute Rehab OT Goals Patient Stated Goal: to get better OT Goal Formulation: With patient/family Time For Goal Achievement: 04/07/18 Potential to Achieve Goals: Good ADL Goals Pt Will Perform Lower Body Dressing: with mod assist;sit to/from stand Pt Will Transfer to Toilet: with mod assist;bedside commode;ambulating(LRAD for amb)  OT Frequency: Min 2X/week   Barriers to D/C: Decreased caregiver support;Inaccessible home environment          Co-evaluation              AM-PAC PT "6 Clicks" Daily Activity     Outcome Measure Help from another person eating meals?: A Little Help from another person taking care of personal grooming?: A Little Help from another person toileting, which includes using toliet, bedpan, or urinal?: A Lot Help from another person bathing (including washing, rinsing, drying)?: A Lot Help from another person to put on and taking off regular upper body clothing?: A Lot Help from another person to put on and taking off regular lower body clothing?: A Lot 6 Click Score: 14   End of Session    Activity Tolerance: Patient limited by pain Patient left: in bed;with call bell/phone within reach;with bed alarm set;with family/visitor present  OT Visit Diagnosis: Other abnormalities of gait and mobility (R26.89);Feeding difficulties (R63.3);Muscle weakness (generalized) (M62.81);Repeated falls (R29.6);Pain;Other symptoms and signs involving cognitive function Pain - Right/Left: Right(both) Pain -  part of body: Hip                Time: 1139-1205 OT Time Calculation (min): 26 min Charges:  OT General Charges $OT Visit: 1 Visit OT Evaluation $OT Eval Moderate Complexity: 1 Mod OT Treatments $Self Care/Home Management : 8-22 mins  Jeni Salles, MPH, MS, OTR/L ascom [phone] 03/24/18, 12:28 PM

## 2018-03-24 NOTE — Telephone Encounter (Signed)
Copied from [place] [phone]. Topic: Appointment Scheduling - Scheduling Inquiry for Clinic >> Mar 24, 2018  2:21 PM Rutherford Nail, [place] wrote: Reason for CRM: [facility] calling from [facility]. States patient is being discharged and needs a hospital follow up within 1 week. No availability on Dr Bary Leriche schedule within that time. Please advise.

## 2018-03-24 NOTE — Plan of Care (Signed)
  Problem: Pain Managment: Goal: General experience of comfort will improve Outcome: Progressing   Problem: Safety: Goal: Ability to remain free from injury will improve Outcome: Progressing   

## 2018-03-24 NOTE — Progress Notes (Signed)
Family Meeting Note  Advance Directive:yes  Today a meeting took place with the Patient.    The following clinical team members were present during this meeting:MD  The following were discussed:Patient's diagnosis: Pubic rami fractures with acute hypoxic respiratory failure from pneumonia, Patient's progosis: Unable to determine and Goals for treatment: DNR  Additional follow-up to be provided: DNR sheet signed.  DNR order written.  Her power of attorney is her Sister Kayla Galloway  Time spent during discussion:16 minutes  Kayla Galloway, Ulice Bold, MD

## 2018-03-24 NOTE — Progress Notes (Signed)
Physical Therapy Treatment Patient Details Name: Kayla Galloway MRN: [id number] DOB: 1921/07/18 Today's Date: 03/24/2018    History of Present Illness Pt presents to hospital on 03/22/18 after a fall in which she stood up, got dizzy and fell over. Pt diagnosed with community aquired pneumonia, closed fracture of R superior ramus, B fx of inferior pubic ramus, L ankle sprain, acute respiratory failure with hypoxemia, and community aquired pneumonia. Pt has a past medical history that includes anemia, arthritis, depression, GERD, HTN, osteoporosis, and PVD.    PT Comments    Pt seen this morning and states that she is doing much better than yesterday. Pt is very confused throughout session however is very pleasant. Pt oriented only to person. Pt able to perform there-ex independently however within limited range due to pain with larger ranges of motion. Pt min assist for rolling in bed and mod to max assist for side to sitting EOB. Pt mod assist transfers sit<>stand from elevated surface however can maintain standing with hand held assistance and min assist although pts pain increased with standing. Before attempting amb RN enters room requesting pt remain in bed so she can place a catheter. Pt returned to bed. Pts O2 saturation monitored throughout. At beginning of session pt 91% on RA however desaturates to 84% with transfer. Once back in bed pt back on 2L via [place] and RN assumed care of pt. Pt could benefit from continued skilled therapy at this time to improve deficits toward PLOF. PT will continue to work with pt 7x/week while admitted. D/c recommendations continue to be SNF.    Follow Up Recommendations  SNF     Equipment Recommendations  None recommended by PT    Recommendations for Other Services       Precautions / Restrictions Precautions Precautions: None Restrictions Weight Bearing Restrictions: Yes RLE Weight Bearing: Weight bearing as tolerated LLE Weight Bearing: Weight  bearing as tolerated    Mobility  Bed Mobility Overal bed mobility: Needs Assistance Bed Mobility: Rolling;Supine to Sit Rolling: Min assist   Supine to sit: Mod assist;Max assist     General bed mobility comments: Pt min assist rolls onto her R side before requiring mod to max assist to go sidelying to sitting EOB with focus of assistance on B LEs  Transfers Overall transfer level: Needs assistance Equipment used: 1 person hand held assist Transfers: Sit to/from Stand Sit to Stand: Mod assist;From elevated surface         General transfer comment: Pt mod assist to transfer sit<>stand from an elevated surface. Pt complains of increase in pain during transfer however once in standing is able to maintain standing with hand held assistance and min assist  Ambulation/Gait             General Gait Details: Not assessed this visit once transferred sit>stand and before attempting amb RN enters room stating that she needs pt to remain in bed to insert catheter. Pt likely would have been max assist to take a few steps to chair.   Stairs             Wheelchair Mobility    Modified Rankin (Stroke Patients Only)       Balance Overall balance assessment: Needs assistance   Sitting balance-Leahy Scale: Good Sitting balance - Comments: Able to sit EOB without UE support and no gross LOB     Standing balance-Leahy Scale: Fair Standing balance comment: Pt requires hand held assistance and min assist to maintain standing,  no gross LOB                            Cognition Arousal/Alertness: Awake/alert Behavior During Therapy: WFL for tasks assessed/performed Overall Cognitive Status: Impaired/Different from baseline Area of Impairment: Orientation;Memory                 Orientation Level: Disoriented to;Place;Time;Situation   Memory: Decreased short-term memory         General Comments: Pt A&O x1, sister confirms that pt is definitely off of  her baseline      Exercises Other Exercises Other Exercises: Pt instructed in B LE ther ex able to perform all independently however within a limited range due to pain with large ranges of motion including SLR, abd/add, ankle pumps, glut sets, and SAQ    General Comments        Pertinent Vitals/Pain Pain Assessment: 0-10 Pain Score: 4 (4/10 at rest, increases during ther-ex and transfer) Pain Location: B pelvis Pain Descriptors / Indicators: Sharp;Aching Pain Intervention(s): Limited activity within patient's tolerance;Monitored during session    Home Living                      Prior Function            PT Goals (current goals can now be found in the care plan section) Acute Rehab PT Goals Patient Stated Goal: to stop having so much pain PT Goal Formulation: With patient Time For Goal Achievement: 04/06/18 Potential to Achieve Goals: Fair Progress towards PT goals: Progressing toward goals    Frequency    7X/week      PT Plan Current plan remains appropriate    Co-evaluation              AM-PAC PT "6 Clicks" Daily Activity  Outcome Measure  Difficulty turning over in bed (including adjusting bedclothes, sheets and blankets)?: Unable Difficulty moving from lying on back to sitting on the side of the bed? : Unable Difficulty sitting down on and standing up from a chair with arms (e.g., wheelchair, bedside commode, etc,.)?: Unable Help needed moving to and from a bed to chair (including a wheelchair)?: Total Help needed walking in hospital room?: Total Help needed climbing 3-5 steps with a railing? : Total 6 Click Score: 6    End of Session Equipment Utilized During Treatment: Gait belt Activity Tolerance: Patient limited by pain Patient left: in bed;with call bell/phone within reach;with bed alarm set;with family/visitor present;with nursing/sitter in room Nurse Communication: Other (comment)(O2) PT Visit Diagnosis: Unsteadiness on feet  (R26.81);Other abnormalities of gait and mobility (R26.89);Repeated falls (R29.6);History of falling (Z91.81);Muscle weakness (generalized) (M62.81);Difficulty in walking, not elsewhere classified (R26.2);Pain Pain - Right/Left: (both) Pain - part of body: (pelvis)     Time: 0937-1000 PT Time Calculation (min) (ACUTE ONLY): 23 min  Charges:                        Yolonda Kida, SPT    Lotus Santillo 03/24/2018, 11:03 AM

## 2018-03-24 NOTE — Discharge Summary (Addendum)
[facility] [place] NAME: Kayla Galloway    MR#:  [id number]  DATE OF BIRTH:  1921-05-17  DATE OF ADMISSION:  03/22/2018 ADMITTING PHYSICIAN: Amelia Jo, MD  DATE OF DISCHARGE: March 25, 2018  PRIMARY CARE PHYSICIAN: [name], MD    ADMISSION DIAGNOSIS:  Intractable pain [R52] Closed fracture of right inferior pubic ramus, initial encounter [facility]) [S32.591A] Closed fracture of right superior pubic ramus, initial encounter ([place]) [S32.511A] Acute respiratory failure with hypoxemia ([place]) [J96.01]  DISCHARGE DIAGNOSIS:  Active Problems:   Fracture of oth parts of pelvis, init for clos fx ([place])   SECONDARY DIAGNOSIS:   Past Medical History:  Diagnosis Date  . Anemia   . Arthritis   . Cancer ([place])    skin ca lesion of scalp- removed  . Depression   . DVT (deep venous thrombosis), left    bilateral, IVC filter 2010  . GERD (gastroesophageal reflux disease)   . Gout   . Hypercholesterolemia   . Hyperkalemia   . Hypertension    Dr. [name]  . Nephrolithiasis   . Obesity   . Osteoporosis   . Peripheral vascular disease ([place])   . 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 Spider veins     HOSPITAL COURSE:   84 year old female with history of essential hypertension who presented after mechanical fall.  1.  Acute hypoxic respiratory failure in the setting of community-acquired pneumonia: She will continue Levaquin which has been renally dose as per pharmacy. she will need O2 at discharge and ISS.   2.  Community acquired pneumonia: Continue Levaquin for total of 5-day treatment.  3.  Acute bilateral inferior pubic and right superior pubic rami fracture status post mechanical fall: Orthopedic and PT consultation pending This is nonoperative and patient will need supportive care including pain medications and  physical therapy.    4.  Essential hypertension: Continue ramipril  5.  Depression: Continue Zoloft and Lasix  6.  Anemia of chronic disease: Continue ferrous sulfate   7.  Acute urinary retention: Patient now has Foley catheter placed and will need voiding trial in 1 week.  She is started on Flomax.  DISCHARGE CONDITIONS AND DIET:   Stable for discharge on regular diet  CONSULTS OBTAINED:  Treatment Team:  Thornton Park, MD  DRUG ALLERGIES:   Allergies  Allergen Reactions  . Cefuroxime Axetil Rash    Pt states that she does fine with Keflex.    . Doxycycline Nausea Only and Other (See Comments)    Reaction:  Weight loss   . Advil [Ibuprofen] Swelling  . Celebrex [Celecoxib] Nausea Only  . Daypro [Oxaprozin] Other (See Comments)    Reaction:  Dizziness   . Etodolac Nausea Only  . Macrobid [employer] Macro] Other (See Comments)    Reaction:  Burning   . Penicillins Other (See Comments)    Reaction:  Burning   . Percocet [Oxycodone-Acetaminophen] Nausea Only and Swelling  . Tramadol Other (See Comments)    Reaction:  Unknown   . Valium [Diazepam] Other (See Comments)    Reaction:  Dizziness    . Neosporin [Neomycin-Bacitracin Zn-Polymyx] Rash  . Vicodin [Hydrocodone-Acetaminophen] Nausea Only    DISCHARGE MEDICATIONS:   Allergies as of 03/24/2018      Reactions   Cefuroxime Axetil Rash   Pt states that she does fine  with Keflex.     Doxycycline Nausea Only, Other (See Comments)   Reaction:  Weight loss    Advil [ibuprofen] Swelling   Celebrex [celecoxib] Nausea Only   Daypro [oxaprozin] Other (See Comments)   Reaction:  Dizziness    Etodolac Nausea Only   Macrobid [nitrofurantoin Monohyd Macro] Other (See Comments)   Reaction:  Burning    Penicillins Other (See Comments)   Reaction:  Burning    Percocet [oxycodone-acetaminophen] Nausea Only, Swelling   Tramadol Other (See Comments)   Reaction:  Unknown    Valium [diazepam] Other (See  Comments)   Reaction:  Dizziness    Neosporin [neomycin-bacitracin Zn-polymyx] Rash   Vicodin [hydrocodone-acetaminophen] Nausea Only      Medication List    TAKE these medications   acetaminophen 650 MG CR tablet Commonly known as:  TYLENOL Take 650 mg by mouth every 8 (eight) hours as needed for pain.   bifidobacterium infantis capsule Take 1 capsule by mouth daily.   Calcium 600-200 MG-UNIT tablet Take 1 tablet by mouth 3 (three) times daily with meals.   cholecalciferol 1000 units tablet Commonly known as:  VITAMIN D Take 1,000 Units by mouth daily.   docusate sodium 100 MG capsule Commonly known as:  COLACE Take 2 capsules (200 mg total) by mouth 2 (two) times daily.   ferrous sulfate 325 (65 FE) MG tablet Take 325 mg by mouth every evening.   furosemide 20 MG tablet Commonly known as:  LASIX Take 1 tablet (20 mg total) by mouth daily.   Glucosamine-Chondroitin 250-200 MG Tabs Take 1 tablet by mouth 2 (two) times daily.   levofloxacin 750 MG tablet Commonly known as:  LEVAQUIN Take 1 tablet (750 mg total) by mouth every other day for 2 days. Start taking on:  03/25/2018   mupirocin ointment 2 % Commonly known as:  BACTROBAN Apply 1 application topically 2 (two) times daily.   nystatin cream Commonly known as:  MYCOSTATIN Apply 1 application topically 2 (two) times daily. Apply under breast   omeprazole 20 MG capsule Commonly known as:  PRILOSEC TAKE 1 CAPSULE BY MOUTH ONCE DAILY   phenylephrine 0.25 % suppository Commonly known as:  (USE for PREPARATION-H) Place 1 suppository rectally 2 (two) times daily.   polyethylene glycol packet Commonly known as:  MIRALAX / GLYCOLAX Take 17 g by mouth daily as needed for mild constipation.   ramipril 5 MG capsule Commonly known as:  ALTACE TAKE 1 CAPSULE BY MOUTH ONCE DAILY   sertraline 50 MG tablet Commonly known as:  ZOLOFT TAKE 1 TABLET BY MOUTH ONCE DAILY   tamsulosin 0.4 MG Caps capsule Commonly  known as:  FLOMAX Take 1 capsule (0.4 mg total) by mouth daily.   traMADol 50 MG tablet Commonly known as:  ULTRAM Take 1 tablet (50 mg total) by mouth every 6 (six) hours as needed for up to 7 days for moderate pain.   triamcinolone cream 0.5 % Commonly known as:  KENALOG Apply topically 2 (two) times daily.         Today   CHIEF COMPLAINT:   Patient feels that her pelvic pain has improved.   VITAL SIGNS:  Blood pressure (!) 169/74, pulse 79, temperature 97.8 F (36.6 C), temperature source Axillary, resp. rate 16, height 5' (1.524 m), weight 120 lb (54.4 kg), SpO2 98 %.   REVIEW OF SYSTEMS:  Review of Systems  Constitutional: Negative.  Negative for chills, fever and malaise/fatigue.  HENT: Negative.  Negative for  ear discharge, ear pain, hearing loss, nosebleeds and sore throat.   Eyes: Negative.  Negative for blurred vision and pain.  Respiratory: Negative.  Negative for cough, hemoptysis, shortness of breath and wheezing.   Cardiovascular: Negative.  Negative for chest pain, palpitations and leg swelling.  Gastrointestinal: Negative.  Negative for abdominal pain, blood in stool, diarrhea, nausea and vomiting.  Genitourinary: Negative.  Negative for dysuria.  Musculoskeletal: Positive for falls. Negative for back pain.       Pelvic pain better  Skin: Negative.   Neurological: Negative for dizziness, tremors, speech change, focal weakness, seizures and headaches.  Endo/Heme/Allergies: Negative.  Does not bruise/bleed easily.  Psychiatric/Behavioral: Negative.  Negative for depression, hallucinations and suicidal ideas.     PHYSICAL EXAMINATION:  GENERAL:  84 y.o.-year-old patient lying in the bed with no acute distress.  NECK:  Supple, no jugular venous distention. No thyroid enlargement, no tenderness.  LUNGS: Normal breath sounds bilaterally, no wheezing, rales,rhonchi  No use of accessory muscles of respiration.  CARDIOVASCULAR: S1, S2 normal. No murmurs,  rubs, or gallops.  ABDOMEN: Soft, non-tender, non-distended. Bowel sounds present. No organomegaly or mass.  EXTREMITIES: No pedal edema, cyanosis, or clubbing.  PSYCHIATRIC: The patient is alert and oriented x 3.  SKIN: No obvious rash, lesion, or ulcer.   DATA REVIEW:   CBC Recent Labs  Lab 03/23/18 0529  WBC 6.8  HGB 9.7*  HCT 28.5*  PLT 99*    Chemistries  Recent Labs  Lab 03/22/18 1928 03/23/18 0529  NA 135 135  K 4.2 4.4  CL 100 101  CO2 24 26  GLUCOSE 134* 130*  BUN 32* 32*  CREATININE 1.13* 1.13*  CALCIUM 9.2 8.6*  AST 30  --   ALT 19  --   ALKPHOS 33*  --   BILITOT 0.7  --     Cardiac Enzymes Recent Labs  Lab 03/22/18 1928  TROPONINI <0.03    Microbiology Results  @[email or web address] @  RADIOLOGY:  Dg Ankle Complete Left  Result Date: 03/22/2018 CLINICAL DATA:  Patient fell today and presents with left ankle pain. EXAM: LEFT ANKLE COMPLETE - 3+ VIEW COMPARISON:  02/03/2017 FINDINGS: There is no evidence of fracture, dislocation, or joint effusion. There is no evidence of arthropathy or other focal bone abnormality. Mild soft tissue induration and swelling is noted of the included left leg and about the malleoli. Minimal enthesopathy off the dorsum and plantar aspect of the calcaneus. Vascular phleboliths and calcifications are noted as before along the tibial and dorsalis pedis arteries in particular. IMPRESSION: Soft tissue swelling of the left ankle without acute fracture or joint dislocation. Electronically Signed   By: Ashley Royalty M.D.   On: 03/22/2018 20:48   Dg Chest Portable 1 View  Result Date: 03/22/2018 CLINICAL DATA:  Acute onset of shortness of breath and hypoxemia. EXAM: PORTABLE CHEST 1 VIEW COMPARISON:  Chest radiograph performed 03/25/2017 FINDINGS: The lungs are mildly hypoexpanded. Mild left basilar airspace opacity may reflect atelectasis or mild pneumonia. There is no evidence of pleural effusion or pneumothorax. The cardiomediastinal  silhouette is within normal limits. No acute osseous abnormalities are seen. The patient is status post vertebroplasty at multiple levels along the thoracic spine. IMPRESSION: Lungs mildly hypoexpanded. Mild left basilar airspace opacity may reflect atelectasis or mild pneumonia. Electronically Signed   By: Garald Balding M.D.   On: 03/22/2018 22:14   Dg Hip Unilat W Or Wo Pelvis 2-3 Views Right  Result Date: 03/22/2018 CLINICAL DATA:  Patient  fell today complaining of right hip and ankle pain. EXAM: DG HIP (WITH OR WITHOUT PELVIS) 2-3V RIGHT COMPARISON:  08/05/2012 CT FINDINGS: Acute bilateral inferior pubic and right superior pubic rami fractures are noted with a suspicious lucency also involving the left superior pubic ramus that may represent a nondisplaced pubic ramus fracture is well. Slight caudal displacement of the right parasymphysis secondary to fracture is noted. Vertebral augmentation cement is identified within the L4 and L5 vertebral bodies. Adjacent right IVC filter is in place. No proximal femoral fracture is noted with left femoral nail fixation identified. No joint dislocation of either hip. IMPRESSION: 1. Acute bilateral inferior pubic and right superior pubic rami fractures are noted with equivocal left superior pubic ramus fracture as well. 2. Vertebral augmentation L4 and L5. 3. No hip fracture. Electronically Signed   By: Ashley Royalty M.D.   On: 03/22/2018 20:45      Allergies as of 03/24/2018      Reactions   Cefuroxime Axetil Rash   Pt states that she does fine with Keflex.     Doxycycline Nausea Only, Other (See Comments)   Reaction:  Weight loss    Advil [ibuprofen] Swelling   Celebrex [celecoxib] Nausea Only   Daypro [oxaprozin] Other (See Comments)   Reaction:  Dizziness    Etodolac Nausea Only   Macrobid [nitrofurantoin Monohyd Macro] Other (See Comments)   Reaction:  Burning    Penicillins Other (See Comments)   Reaction:  Burning    Percocet  [oxycodone-acetaminophen] Nausea Only, Swelling   Tramadol Other (See Comments)   Reaction:  Unknown    Valium [diazepam] Other (See Comments)   Reaction:  Dizziness    Neosporin [neomycin-bacitracin Zn-polymyx] Rash   Vicodin [hydrocodone-acetaminophen] Nausea Only      Medication List    TAKE these medications   acetaminophen 650 MG CR tablet Commonly known as:  TYLENOL Take 650 mg by mouth every 8 (eight) hours as needed for pain.   bifidobacterium infantis capsule Take 1 capsule by mouth daily.   Calcium 600-200 MG-UNIT tablet Take 1 tablet by mouth 3 (three) times daily with meals.   cholecalciferol 1000 units tablet Commonly known as:  VITAMIN D Take 1,000 Units by mouth daily.   docusate sodium 100 MG capsule Commonly known as:  COLACE Take 2 capsules (200 mg total) by mouth 2 (two) times daily.   ferrous sulfate 325 (65 FE) MG tablet Take 325 mg by mouth every evening.   furosemide 20 MG tablet Commonly known as:  LASIX Take 1 tablet (20 mg total) by mouth daily.   Glucosamine-Chondroitin 250-200 MG Tabs Take 1 tablet by mouth 2 (two) times daily.   levofloxacin 750 MG tablet Commonly known as:  LEVAQUIN Take 1 tablet (750 mg total) by mouth every other day for 2 days. Start taking on:  03/25/2018   mupirocin ointment 2 % Commonly known as:  BACTROBAN Apply 1 application topically 2 (two) times daily.   nystatin cream Commonly known as:  MYCOSTATIN Apply 1 application topically 2 (two) times daily. Apply under breast   omeprazole 20 MG capsule Commonly known as:  PRILOSEC TAKE 1 CAPSULE BY MOUTH ONCE DAILY   phenylephrine 0.25 % suppository Commonly known as:  (USE for PREPARATION-H) Place 1 suppository rectally 2 (two) times daily.   polyethylene glycol packet Commonly known as:  MIRALAX / GLYCOLAX Take 17 g by mouth daily as needed for mild constipation.   ramipril 5 MG capsule Commonly known as:  ALTACE TAKE 1 CAPSULE BY MOUTH ONCE DAILY    sertraline 50 MG tablet Commonly known as:  ZOLOFT TAKE 1 TABLET BY MOUTH ONCE DAILY   tamsulosin 0.4 MG Caps capsule Commonly known as:  FLOMAX Take 1 capsule (0.4 mg total) by mouth daily.   traMADol 50 MG tablet Commonly known as:  ULTRAM Take 1 tablet (50 mg total) by mouth every 6 (six) hours as needed for up to 7 days for moderate pain.   triamcinolone cream 0.5 % Commonly known as:  KENALOG Apply topically 2 (two) times daily.          Management plans discussed with the patient and she is in agreement. Stable for discharge   Patient should follow up with ortho  CODE STATUS:     Code Status Orders  (From admission, onward)        Start     Ordered   03/23/18 0425  Full code  Continuous     03/23/18 0424    Code Status History    Date Active Date Inactive Code Status Order ID Comments User Context   02/04/2017 0238 02/10/2017 1817 Full Code [id number]  Lance Coon, MD ED   09/06/2015 1246 09/06/2015 1907 Full Code [id number]  Hessie Knows, MD Inpatient   01/24/2015 1430 01/26/2015 1907 Full Code [id number]  Max Sane, MD Inpatient   01/12/2015 2248 01/15/2015 1428 Full Code [id number]  Aldean Jewett, MD Inpatient    Advance Directive Documentation     Most Recent Value  Type of Advance Directive  Healthcare Power of Attorney, Living will  Pre-existing out of facility DNR order (yellow form or pink MOST form)  -  "MOST" Form in Place?  -      TOTAL TIME TAKING CARE OF THIS PATIENT: 38 minutes.    Note: This dictation was prepared with Dragon dictation along with smaller phrase technology. Any transcriptional errors that result from this process are unintentional.  Tawanda Schall M.D on 03/24/2018 at 9:09 AM  Between 7am to 6pm - Pager - [phone] After 6pm go to [email or web address] - password [email or web address]  Office  [phone]  CC: Primary care physician; [name], MD

## 2018-03-24 NOTE — Progress Notes (Signed)
16 ' french foley catheter inserted for urinary retention, patient tolerated, catheter in place and patent.

## 2018-03-25 DIAGNOSIS — Z7401 Bed confinement status: Secondary | ICD-10-CM | POA: Diagnosis not present

## 2018-03-25 DIAGNOSIS — R600 Localized edema: Secondary | ICD-10-CM | POA: Diagnosis not present

## 2018-03-25 DIAGNOSIS — S0181XD Laceration without foreign body of other part of head, subsequent encounter: Secondary | ICD-10-CM | POA: Diagnosis not present

## 2018-03-25 DIAGNOSIS — I10 Essential (primary) hypertension: Secondary | ICD-10-CM | POA: Diagnosis not present

## 2018-03-25 DIAGNOSIS — S32591D Other specified fracture of right pubis, subsequent encounter for fracture with routine healing: Secondary | ICD-10-CM | POA: Diagnosis not present

## 2018-03-25 DIAGNOSIS — K219 Gastro-esophageal reflux disease without esophagitis: Secondary | ICD-10-CM | POA: Diagnosis not present

## 2018-03-25 DIAGNOSIS — I739 Peripheral vascular disease, unspecified: Secondary | ICD-10-CM | POA: Diagnosis not present

## 2018-03-25 DIAGNOSIS — M810 Age-related osteoporosis without current pathological fracture: Secondary | ICD-10-CM | POA: Diagnosis not present

## 2018-03-25 DIAGNOSIS — M545 Low back pain: Secondary | ICD-10-CM | POA: Diagnosis not present

## 2018-03-25 DIAGNOSIS — R338 Other retention of urine: Secondary | ICD-10-CM | POA: Diagnosis not present

## 2018-03-25 DIAGNOSIS — M79606 Pain in leg, unspecified: Secondary | ICD-10-CM | POA: Diagnosis not present

## 2018-03-25 DIAGNOSIS — M79661 Pain in right lower leg: Secondary | ICD-10-CM | POA: Diagnosis not present

## 2018-03-25 DIAGNOSIS — M79605 Pain in left leg: Secondary | ICD-10-CM | POA: Diagnosis not present

## 2018-03-25 DIAGNOSIS — R54 Age-related physical debility: Secondary | ICD-10-CM | POA: Diagnosis not present

## 2018-03-25 DIAGNOSIS — S322XXA Fracture of coccyx, initial encounter for closed fracture: Secondary | ICD-10-CM | POA: Diagnosis not present

## 2018-03-25 DIAGNOSIS — R079 Chest pain, unspecified: Secondary | ICD-10-CM | POA: Diagnosis not present

## 2018-03-25 DIAGNOSIS — S32511D Fracture of superior rim of right pubis, subsequent encounter for fracture with routine healing: Secondary | ICD-10-CM | POA: Diagnosis not present

## 2018-03-25 DIAGNOSIS — M79662 Pain in left lower leg: Secondary | ICD-10-CM | POA: Diagnosis not present

## 2018-03-25 DIAGNOSIS — M109 Gout, unspecified: Secondary | ICD-10-CM | POA: Diagnosis not present

## 2018-03-25 DIAGNOSIS — D649 Anemia, unspecified: Secondary | ICD-10-CM | POA: Diagnosis not present

## 2018-03-25 DIAGNOSIS — Z9189 Other specified personal risk factors, not elsewhere classified: Secondary | ICD-10-CM | POA: Diagnosis not present

## 2018-03-25 DIAGNOSIS — R609 Edema, unspecified: Secondary | ICD-10-CM | POA: Diagnosis not present

## 2018-03-25 DIAGNOSIS — M7989 Other specified soft tissue disorders: Secondary | ICD-10-CM | POA: Diagnosis not present

## 2018-03-25 DIAGNOSIS — S3289XA Fracture of other parts of pelvis, initial encounter for closed fracture: Secondary | ICD-10-CM | POA: Diagnosis not present

## 2018-03-25 DIAGNOSIS — M79604 Pain in right leg: Secondary | ICD-10-CM | POA: Diagnosis not present

## 2018-03-25 DIAGNOSIS — S329XXA Fracture of unspecified parts of lumbosacral spine and pelvis, initial encounter for closed fracture: Secondary | ICD-10-CM | POA: Diagnosis not present

## 2018-03-25 DIAGNOSIS — M1990 Unspecified osteoarthritis, unspecified site: Secondary | ICD-10-CM | POA: Diagnosis not present

## 2018-03-25 DIAGNOSIS — J9601 Acute respiratory failure with hypoxia: Secondary | ICD-10-CM | POA: Diagnosis not present

## 2018-03-25 DIAGNOSIS — R0989 Other specified symptoms and signs involving the circulatory and respiratory systems: Secondary | ICD-10-CM | POA: Diagnosis not present

## 2018-03-25 DIAGNOSIS — M8000XD Age-related osteoporosis with current pathological fracture, unspecified site, subsequent encounter for fracture with routine healing: Secondary | ICD-10-CM | POA: Diagnosis not present

## 2018-03-25 DIAGNOSIS — J159 Unspecified bacterial pneumonia: Secondary | ICD-10-CM | POA: Diagnosis not present

## 2018-03-25 DIAGNOSIS — W19XXXD Unspecified fall, subsequent encounter: Secondary | ICD-10-CM | POA: Diagnosis not present

## 2018-03-25 DIAGNOSIS — E785 Hyperlipidemia, unspecified: Secondary | ICD-10-CM | POA: Diagnosis not present

## 2018-03-25 DIAGNOSIS — M549 Dorsalgia, unspecified: Secondary | ICD-10-CM | POA: Diagnosis not present

## 2018-03-25 DIAGNOSIS — M6281 Muscle weakness (generalized): Secondary | ICD-10-CM | POA: Diagnosis not present

## 2018-03-25 DIAGNOSIS — M546 Pain in thoracic spine: Secondary | ICD-10-CM | POA: Diagnosis not present

## 2018-03-25 DIAGNOSIS — S2242XD Multiple fractures of ribs, left side, subsequent encounter for fracture with routine healing: Secondary | ICD-10-CM | POA: Diagnosis not present

## 2018-03-25 DIAGNOSIS — Z85828 Personal history of other malignant neoplasm of skin: Secondary | ICD-10-CM | POA: Diagnosis not present

## 2018-03-25 DIAGNOSIS — J189 Pneumonia, unspecified organism: Secondary | ICD-10-CM | POA: Diagnosis not present

## 2018-03-25 DIAGNOSIS — R110 Nausea: Secondary | ICD-10-CM | POA: Diagnosis not present

## 2018-03-25 DIAGNOSIS — E441 Mild protein-calorie malnutrition: Secondary | ICD-10-CM | POA: Diagnosis not present

## 2018-03-25 LAB — GLUCOSE, CAPILLARY: GLUCOSE-CAPILLARY: 98 mg/dL (ref 70–99)

## 2018-03-25 MED ORDER — FLEET ENEMA 7-19 GM/118ML RE ENEM
1.0000 | ENEMA | Freq: Once | RECTAL | Status: AC
  Administered 2018-03-25: 1 via RECTAL

## 2018-03-25 NOTE — Progress Notes (Signed)
PT Cancellation Note  Patient Details Name: Kayla Galloway MRN: [id number] DOB: 03/15/1921   Cancelled Treatment:     PT attempted x2 this date however pt asleep on first visit. RN states that pt has not slept since admission so PT would try back later. On next visit RN had just given suppository and states that pt is resting comfortably, likely not a good time for PT. CSW informs PT that pt is leaving today for SNF. PT will not try back due to pending d/c.  Yolonda Kida, SPT    Allister Lessley 03/25/2018, 3:02 PM

## 2018-03-25 NOTE — Progress Notes (Signed)
Patient is medically stable for D/C to [facility] today. Per Tiffany admissions coordinator at [facility] SN[facility] authorization has been received and she is waiting on the perc-sert nurse to call her with the authorization details. Per Tiffany patient can come to [facility] today to room 403. RN will call report and arrange EMS for transport. Clinical Education officer, museum (CSW) sent D/C orders to [facility] via [place]. Patient is aware of above. CSW contacted patient's sister [name] and made her aware of above. Please reconsult if future social work needs arise. CSW signing off.   [employer], LCSW [phone]

## 2018-03-25 NOTE — Clinical Social Work Placement (Signed)
   CLINICAL SOCIAL WORK PLACEMENT  NOTE  Date:  03/25/2018  Patient Details  Name: Kayla Galloway MRN: [id number] Date of Birth: May 17, 1921  Clinical Social Work is seeking post-discharge placement for this patient at the [place] level of care (*CSW will initial, date and re-position this form in  chart as items are completed):  Yes   Patient/family provided with [place] Work Department's list of facilities offering this level of care within the geographic area requested by the patient (or if unable, by the patient's family).  Yes   Patient/family informed of their freedom to choose among providers that offer the needed level of care, that participate in Medicare, Medicaid or managed care program needed by the patient, have an available bed and are willing to accept the patient.  Yes   Patient/family informed of [facility]'s ownership interest in [facility] and [facility], as well as of the fact that they are under no obligation to receive care at these facilities.  PASRR submitted to EDS on       PASRR number received on       Existing PASRR number confirmed on 03/23/18     FL2 transmitted to all facilities in geographic area requested by pt/family on 03/23/18     FL2 transmitted to all facilities within larger geographic area on       Patient informed that his/her managed care company has contracts with or will negotiate with certain facilities, including the following:        Yes   Patient/family informed of bed offers received.  Patient chooses bed at [facility] )     Physician recommends and patient chooses bed at      Patient to be transferred to [facility] ) on 03/25/18.  Patient to be transferred to facility by [facility] )     Patient family notified on 03/25/18 of transfer.  Name of family member notified:  (Patient's sister [name] is aware of D/C today. )     PHYSICIAN       Additional  Comment:    _______________________________________________ Jakobee Brackins, Veronia Beets, LCSW 03/25/2018, 2:28 PM

## 2018-03-25 NOTE — Care Management Important Message (Signed)
Important Message  Patient Details  Name: Kayla Galloway MRN: [id number] Date of Birth: 07/08/1921   Medicare Important Message Given:  Yes    [name] A Nyshaun Standage 03/25/2018, 11:11 AM

## 2018-03-25 NOTE — Progress Notes (Signed)
[place] [facility] [place] NAME: Kayla Galloway    MR#:  [id number]  DATE OF BIRTH:  1921-08-13  SUBJECTIVE:   Doing better this am  REVIEW OF SYSTEMS:    Review of Systems  Constitutional: Negative for fever, chills weight loss HENT: Negative for ear pain, nosebleeds, congestion, facial swelling, rhinorrhea, neck pain, neck stiffness and ear discharge.   Respiratory: Negative for cough, shortness of breath, wheezing  Cardiovascular: Negative for chest pain, palpitations and leg swelling.  Gastrointestinal: Negative for heartburn, abdominal pain, vomiting, diarrhea or consitpation Genitourinary: Negative for dysuria, urgency, frequency, hematuria Musculoskeletal: Negative for back pain or joint pain Neurological: Negative for dizziness, seizures, syncope, focal weakness,  numbness and headaches.  Hematological: Does not bruise/bleed easily.  Psychiatric/Behavioral: Negative for hallucinations, confusion, dysphoric mood    Tolerating Diet: yes      DRUG ALLERGIES:   Allergies  Allergen Reactions  . Cefuroxime Axetil Rash    Pt states that she does fine with Keflex.    . Doxycycline Nausea Only and Other (See Comments)    Reaction:  Weight loss   . Advil [Ibuprofen] Swelling  . Celebrex [Celecoxib] Nausea Only  . Daypro [Oxaprozin] Other (See Comments)    Reaction:  Dizziness   . Etodolac Nausea Only  . Macrobid [employer] Macro] Other (See Comments)    Reaction:  Burning   . Penicillins Other (See Comments)    Reaction:  Burning   . Percocet [Oxycodone-Acetaminophen] Nausea Only and Swelling  . Tramadol Other (See Comments)    Reaction:  Unknown   . Valium [Diazepam] Other (See Comments)    Reaction:  Dizziness    . Neosporin [Neomycin-Bacitracin Zn-Polymyx] Rash  . Vicodin [Hydrocodone-Acetaminophen] Nausea Only    VITALS:  Blood pressure (!) 155/70, pulse 79, temperature 97.9 F (36.6 C), temperature  source Oral, resp. rate 18, height 5' (1.524 m), weight 54.4 kg, SpO2 95 %.  PHYSICAL EXAMINATION:  Constitutional: Appears well-developed and well-nourished. No distress. HENT: Normocephalic. Marland Kitchen Oropharynx is clear and moist.  Eyes: Conjunctivae and EOM are normal. PERRLA, no scleral icterus.  Neck: Normal ROM. Neck supple. No JVD. No tracheal deviation. CVS: RRR, S1/S2 +, no murmurs, no gallops, no carotid bruit.  Pulmonary: Effort and breath sounds normal, no stridor, rhonchi, wheezes, rales.  Abdominal: Soft. BS +,  no distension, tenderness, rebound or guarding.  Musculoskeletal: Normal range of motion. No edema and no tenderness.  Neuro: Alert. CN 2-12 grossly intact. No focal deficits. Skin: Skin is warm and dry. No rash noted. Psychiatric: Normal mood and affect.      LABORATORY PANEL:   CBC Recent Labs  Lab 03/23/18 0529  WBC 6.8  HGB 9.7*  HCT 28.5*  PLT 99*   ------------------------------------------------------------------------------------------------------------------  Chemistries  Recent Labs  Lab 03/22/18 1928 03/23/18 0529  NA 135 135  K 4.2 4.4  CL 100 101  CO2 24 26  GLUCOSE 134* 130*  BUN 32* 32*  CREATININE 1.13* 1.13*  CALCIUM 9.2 8.6*  AST 30  --   ALT 19  --   ALKPHOS 33*  --   BILITOT 0.7  --    ------------------------------------------------------------------------------------------------------------------  Cardiac Enzymes Recent Labs  Lab 03/22/18 1928  TROPONINI <0.03   ------------------------------------------------------------------------------------------------------------------  RADIOLOGY:  No results found.   ASSESSMENT AND PLAN:   84 year old female with history of essential hypertension who presented after mechanical fall.  1. Acute hypoxic respiratory failure in the setting of community-acquired pneumonia: She  will continue Levaquin which has been renally dose as per pharmacy. she will need O2 at discharge  and ISS.   2. Community acquired pneumonia: Continue Levaquin for total of 5-day treatment.  3. Acute bilateral inferior pubic and right superior pubic rami fracture status post mechanical fall: Orthopedic and PT consultation pending This is nonoperative and patient will need supportive care including pain medications and physical therapy.    4. Essential hypertension: Continue ramipril  5. Depression: Continue Zoloft and Lasix  6. Anemia of chronic disease: Continue ferrous sulfate   7.  Acute urinary retention: Patient now has Foley catheter placed and will need voiding trial in 1 week.  She is started on Flomax      Management plans discussed with the patient and she is in agreement.  CODE STATUS: dnr  TOTAL TIME TAKING CARE OF THIS PATIENT: 22 minutes.     POSSIBLE D/C today, DEPENDING ON CLINICAL CONDITION.   [name] M.D on 03/25/2018 at 9:32 AM  Between 7am to 6pm - Pager - [phone] After 6pm go to [email or web address] - password [email or web address] [facility]  Office  [phone]  CC: Primary care physician; [name], MD  Note: This dictation was prepared with Dragon dictation along with smaller phrase technology. Any transcriptional errors that result from this process are unintentional.

## 2018-03-25 NOTE — Progress Notes (Signed)
Pt ready for d/c to SNF today per MD. Pt had a BM after fleets enema per pt request. Pt alert and oriented x2 this afternoon, pleasant but forgetful. Report called to Era Nicola Girt at [facility], all questions answered. Pt's sister at bedside and aware of plan. EMS transportation set up for pt. PIV removed, VSS.   Kayla Galloway, Jerry Caras

## 2018-03-25 NOTE — Progress Notes (Signed)
Per [facility] admissions coordinator at [facility] authorization is under medical director review. Per [facility] said they should have an answer by this afternoon.  [employer], LCSW [phone]

## 2018-03-26 DIAGNOSIS — E441 Mild protein-calorie malnutrition: Secondary | ICD-10-CM | POA: Diagnosis not present

## 2018-03-26 DIAGNOSIS — M8000XD Age-related osteoporosis with current pathological fracture, unspecified site, subsequent encounter for fracture with routine healing: Secondary | ICD-10-CM | POA: Diagnosis not present

## 2018-03-26 DIAGNOSIS — J159 Unspecified bacterial pneumonia: Secondary | ICD-10-CM | POA: Diagnosis not present

## 2018-03-26 DIAGNOSIS — R54 Age-related physical debility: Secondary | ICD-10-CM | POA: Diagnosis not present

## 2018-03-28 LAB — CULTURE, BLOOD (ROUTINE X 2)
CULTURE: NO GROWTH
Culture: NO GROWTH
Special Requests: ADEQUATE
Special Requests: ADEQUATE

## 2018-04-01 DIAGNOSIS — S322XXA Fracture of coccyx, initial encounter for closed fracture: Secondary | ICD-10-CM | POA: Diagnosis not present

## 2018-04-01 DIAGNOSIS — R110 Nausea: Secondary | ICD-10-CM | POA: Diagnosis not present

## 2018-04-02 DIAGNOSIS — M79661 Pain in right lower leg: Secondary | ICD-10-CM | POA: Diagnosis not present

## 2018-04-02 DIAGNOSIS — M79662 Pain in left lower leg: Secondary | ICD-10-CM | POA: Diagnosis not present

## 2018-04-02 DIAGNOSIS — M549 Dorsalgia, unspecified: Secondary | ICD-10-CM | POA: Diagnosis not present

## 2018-04-02 DIAGNOSIS — R0989 Other specified symptoms and signs involving the circulatory and respiratory systems: Secondary | ICD-10-CM | POA: Diagnosis not present

## 2018-04-02 DIAGNOSIS — R338 Other retention of urine: Secondary | ICD-10-CM | POA: Diagnosis not present

## 2018-04-06 DIAGNOSIS — J189 Pneumonia, unspecified organism: Secondary | ICD-10-CM | POA: Diagnosis not present

## 2018-04-06 DIAGNOSIS — M549 Dorsalgia, unspecified: Secondary | ICD-10-CM | POA: Diagnosis not present

## 2018-04-06 DIAGNOSIS — Z9189 Other specified personal risk factors, not elsewhere classified: Secondary | ICD-10-CM | POA: Diagnosis not present

## 2018-04-06 DIAGNOSIS — R338 Other retention of urine: Secondary | ICD-10-CM | POA: Diagnosis not present

## 2018-04-06 DIAGNOSIS — M79606 Pain in leg, unspecified: Secondary | ICD-10-CM | POA: Diagnosis not present

## 2018-04-13 DIAGNOSIS — R338 Other retention of urine: Secondary | ICD-10-CM | POA: Diagnosis not present

## 2018-04-13 DIAGNOSIS — J189 Pneumonia, unspecified organism: Secondary | ICD-10-CM | POA: Diagnosis not present

## 2018-04-13 DIAGNOSIS — Z9189 Other specified personal risk factors, not elsewhere classified: Secondary | ICD-10-CM | POA: Diagnosis not present

## 2018-04-13 DIAGNOSIS — M79606 Pain in leg, unspecified: Secondary | ICD-10-CM | POA: Diagnosis not present

## 2018-04-14 DIAGNOSIS — R110 Nausea: Secondary | ICD-10-CM | POA: Diagnosis not present

## 2018-04-14 DIAGNOSIS — S322XXA Fracture of coccyx, initial encounter for closed fracture: Secondary | ICD-10-CM | POA: Diagnosis not present

## 2018-04-21 DIAGNOSIS — M79606 Pain in leg, unspecified: Secondary | ICD-10-CM | POA: Diagnosis not present

## 2018-04-21 DIAGNOSIS — J189 Pneumonia, unspecified organism: Secondary | ICD-10-CM | POA: Diagnosis not present

## 2018-04-21 DIAGNOSIS — K219 Gastro-esophageal reflux disease without esophagitis: Secondary | ICD-10-CM | POA: Diagnosis not present

## 2018-04-21 DIAGNOSIS — Z9189 Other specified personal risk factors, not elsewhere classified: Secondary | ICD-10-CM | POA: Diagnosis not present

## 2018-04-22 ENCOUNTER — Ambulatory Visit (INDEPENDENT_AMBULATORY_CARE_PROVIDER_SITE_OTHER): Payer: Medicare Other | Admitting: Vascular Surgery

## 2018-04-22 ENCOUNTER — Encounter (INDEPENDENT_AMBULATORY_CARE_PROVIDER_SITE_OTHER): Payer: Self-pay | Admitting: Vascular Surgery

## 2018-04-22 VITALS — BP 111/64 | HR 77 | Resp 16 | Ht 60.0 in | Wt 122.0 lb

## 2018-04-22 DIAGNOSIS — M79605 Pain in left leg: Secondary | ICD-10-CM

## 2018-04-22 DIAGNOSIS — R600 Localized edema: Secondary | ICD-10-CM

## 2018-04-22 DIAGNOSIS — S3289XA Fracture of other parts of pelvis, initial encounter for closed fracture: Secondary | ICD-10-CM

## 2018-04-22 DIAGNOSIS — M79604 Pain in right leg: Secondary | ICD-10-CM | POA: Diagnosis not present

## 2018-04-22 NOTE — Progress Notes (Signed)
Subjective:    Patient ID: Kayla Galloway, female    DOB: 1921-06-19, 84 y.o.   MRN: [id number] Chief Complaint  Patient presents with  . Follow-up    bilateral le pain   Patient last seen in April 2018.  Patient presents at the request of her nursing residents however she was not sent with an aide.  The patient is a poor historian.  Patient was not sent with any medical information explaining why she needed to visit.  From what I could ascertain from the patient, it seems as though she is experiencing bilateral lower extremity pain.  She recently experienced a pelvic fracture approximately 2 months ago.  The patient notes that her bilateral ankles hurt her especially at night or when somebody touches them.  The patient is nonambulatory.  The patient denies any rest pain or ulcer formation to the bilateral lower extremity.  The patient does have some edema however it seems to be mild at this time.  Patient denies any fever, nausea vomiting.  Review of Systems  Constitutional: Negative.   HENT: Negative.   Eyes: Negative.   Respiratory: Negative.   Cardiovascular:       Lower extremity pain  Gastrointestinal: Negative.   Endocrine: Negative.   Genitourinary: Negative.   Musculoskeletal: Negative.   Skin: Negative.   Allergic/Immunologic: Negative.   Neurological: Negative.   Hematological: Negative.   Psychiatric/Behavioral: Negative.       Objective:   Physical Exam  Constitutional: She is oriented to person, place, and time. She appears well-developed and well-nourished. No distress.  HENT:  Head: Normocephalic and atraumatic.  Right Ear: External ear normal.  Left Ear: External ear normal.  Eyes: Pupils are equal, round, and reactive to light. Conjunctivae and EOM are normal.  Neck: Normal range of motion.  Cardiovascular: Normal rate, regular rhythm, normal heart sounds and intact distal pulses.  Pulses:      Radial pulses are 2+ on the right side, and 2+ on the left  side.  Hard to palpate pedal pulses.  The patient's bilateral feet are warm to approximately the toes which they become cooler.  There is no discoloration or pallor noted to the bilateral feet.  There are no ulcerations noted.  There is no cellulitis.    Pulmonary/Chest: Effort normal and breath sounds normal.  Musculoskeletal: Normal range of motion. She exhibits edema (Mild nonpitting edema noted bilaterally).  Neurological: She is alert and oriented to person, place, and time.  Skin: Skin is warm and dry. She is not diaphoretic.  Psychiatric: She has a normal mood and affect. Her behavior is normal.  Vitals reviewed.  BP 111/64 (BP Location: Left Arm)   Pulse 77   Resp 16   Ht 5' (1.524 m)   Wt 122 lb (55.3 kg)   BMI 23.83 kg/m   Past Medical History:  Diagnosis Date  . Anemia   . Arthritis   . Cancer ([place])    skin ca lesion of scalp- removed  . Depression   . DVT (deep venous thrombosis), left    bilateral, IVC filter 2010  . GERD (gastroesophageal reflux disease)   . Gout   . Hypercholesterolemia   . Hyperkalemia   . Hypertension    Dr. [name]  . Nephrolithiasis   . Obesity   . Osteoporosis   . Peripheral vascular disease ([place])   . 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 Retroperitoneal bleed    erosion  of IVC filter through inferior vena cava  . Spider veins    Social History   Socioeconomic History  . Marital status: Widowed    Spouse name: Not on file  . Number of children: 0  . Years of education: Not on file  . Highest education level: Not on file  Occupational History  . Not on file  Social Needs  . Financial resource strain: Not on file  . Food insecurity:    Worry: Not on file    Inability: Not on file  . Transportation needs:    Medical: Not on file    Non-medical: Not on file  Tobacco Use  . Smoking status: Never Smoker  . Smokeless tobacco: Never Used  Substance and Sexual Activity  . Alcohol use: No     Alcohol/week: 0.0 standard drinks  . Drug use: No  . Sexual activity: Never  Lifestyle  . Physical activity:    Days per week: Not on file    Minutes per session: Not on file  . Stress: Not on file  Relationships  . Social connections:    Talks on phone: Not on file    Gets together: Not on file    Attends religious service: Not on file    Active member of club or organization: Not on file    Attends meetings of clubs or organizations: Not on file    Relationship status: Not on file  . Intimate partner violence:    Fear of current or ex partner: Not on file    Emotionally abused: Not on file    Physically abused: Not on file    Forced sexual activity: Not on file  Other Topics Concern  . Not on file  Social History Narrative  . Not on file   Past Surgical History:  Procedure Laterality Date  . CARDIAC CATHETERIZATION     2010 Does not see a cardiac  doctor  . Colonsopy    . DENTAL SURGERY    . ECTOPIC PREGNANCY SURGERY    . EYE SURGERY Bilateral 2011,2012   cataracts  . FRACTURE SURGERY  2009   hip, rod from knee to hip  . HEMORRHOID SURGERY    . IVC filter Left    pt states it has turned side ways and could not be removed.  Marland Kitchen KYPHOPLASTY  09/03/2012   per patient, she has had kypho x 2:  Surgeon: Winfield Cunas, MD;  Location: [facility];  Service: Neurosurgery;  Laterality: N/A;  Thoracic eight Kyphoplasty  . OPEN REDUCTION INTERNAL FIXATION (ORIF) DISTAL RADIAL FRACTURE Left 09/06/2015   Procedure: OPEN REDUCTION INTERNAL FIXATION (ORIF) DISTAL RADIAL FRACTURE;  Surgeon: Hessie Knows, MD;  Location: [facility] ORS;  Service: Orthopedics;  Laterality: Left;  . RIGHT OOPHORECTOMY     partial-  . SKIN CANCER EXCISION     top of head  . TONSILLECTOMY     age 52  . VERTEBROPLASTY  12/31/2011   Procedure: VERTEBROPLASTY;  Surgeon: Winfield Cunas, MD;  Location: [facility];  Service: Neurosurgery;  Laterality: N/A;  Thoracic eleven vertebroplasty   Family History  Problem  Relation Age of Onset  . Heart disease Father        myocardial infarction  . Heart disease Brother        myocardial infarction  . Lymphoma Sister   . Anesthesia problems Neg Hx   . Breast cancer Neg Hx   . Colon cancer Neg Hx  Allergies  Allergen Reactions  . Cefuroxime Axetil Rash    Pt states that she does fine with Keflex.    . Doxycycline Nausea Only and Other (See Comments)    Reaction:  Weight loss   . Advil [Ibuprofen] Swelling  . Celebrex [Celecoxib] Nausea Only  . Daypro [Oxaprozin] Other (See Comments)    Reaction:  Dizziness   . Etodolac Nausea Only  . Macrobid [employer] Macro] Other (See Comments)    Reaction:  Burning   . Penicillins Other (See Comments)    Reaction:  Burning   . Percocet [Oxycodone-Acetaminophen] Nausea Only and Swelling  . Tramadol Other (See Comments)    Reaction:  Unknown   . Valium [Diazepam] Other (See Comments)    Reaction:  Dizziness    . Neosporin [Neomycin-Bacitracin Zn-Polymyx] Rash  . Vicodin [Hydrocodone-Acetaminophen] Nausea Only      Assessment & Plan:  Patient last seen in April 2018.  Patient presents at the request of her nursing residents however she was not sent with an aide.  The patient is a poor historian.  Patient was not sent with any medical information explaining why she needed to visit.  From what I could ascertain from the patient, it seems as though she is experiencing bilateral lower extremity pain.  She recently experienced a pelvic fracture approximately 2 months ago.  The patient notes that her bilateral ankles hurt her especially at night or when somebody touches them.  The patient is nonambulatory.  The patient denies any rest pain or ulcer formation to the bilateral lower extremity.  The patient does have some edema however it seems to be mild at this time.  Patient denies any fever, nausea vomiting.  1. Fracture of oth parts of pelvis, init for clos fx ([place]) - New This may be a contributing  factor to the patient's bilateral lower extremity discomfort  2. Bilateral lower extremity edema - Stable Patient presents with mild edema to the bilateral lower extremities today and physical exam The patient was encouraged to wear graduated compression stockings (20-30 mmHg) on a daily basis. The patient was instructed to begin wearing the stockings first thing in the morning and removing them in the evening. The patient was instructed specifically not to sleep in the stockings.  In addition, behavioral modification including elevation during the day will be continued. The patient was instructed to call the office in the interim if any worsening edema or ulcerations to the legs, feet or toes occurs. The patient expresses their understanding.  3. Lower extremity pain, bilateral - New Patient with multiple risk factors for peripheral artery disease Worsening bilateral lower extremity discomfort Unable to palpate pedal pulses on exam I will bring the patient back as soon as possible and have her undergo bilateral ABI to assess for any contributing peripheral artery disease There is no indication of acute arterial insufficiency to the bilateral lower extremity on physical exam today I have discussed with the patient at length the risk factors for and pathogenesis of atherosclerotic disease and encouraged a healthy diet, regular exercise regimen and blood pressure / glucose control.  The patient was encouraged to call the office in the interim if he experiences any claudication like symptoms, rest pain or ulcers to his feet / toes.  - VAS [place] ABI WITH/WO TBI; Future  Current Outpatient Medications on File Prior to Visit  Medication Sig Dispense Refill  . acetaminophen (TYLENOL) 650 MG CR tablet Take 650 mg by mouth every 8 (eight) hours  as needed for pain.     . Ca Carbonate-Mag Hydroxide (GERI-LANTA SUPREME) 400-135 MG/5ML SUSP Take by mouth 4 (four) times daily as needed.    . Calcium 600-200  MG-UNIT tablet Take 1 tablet by mouth 3 (three) times daily with meals.    . cholecalciferol (VITAMIN D) 1000 units tablet Take 1,000 Units by mouth daily.    Marland Kitchen docusate sodium (COLACE) 100 MG capsule Take 2 capsules (200 mg total) by mouth 2 (two) times daily. 10 capsule 0  . ferrous sulfate 325 (65 FE) MG tablet Take 325 mg by mouth every evening.    . furosemide (LASIX) 20 MG tablet Take 1 tablet (20 mg total) by mouth daily. 90 tablet 1  . Glucosamine-Chondroitin 250-200 MG TABS Take 1 tablet by mouth 2 (two) times daily.     Marland Kitchen omeprazole (PRILOSEC) 20 MG capsule TAKE 1 CAPSULE BY MOUTH ONCE DAILY 90 capsule 1  . ondansetron (ZOFRAN) 4 MG tablet Take 4 mg by mouth every 8 (eight) hours as needed for nausea or vomiting.    Marland Kitchen oxycodone (OXY-IR) 5 MG capsule Take 5 mg by mouth every 4 (four) hours as needed.    . phenylephrine (,USE FOR PREPARATION-H,) 0.25 % suppository Place 1 suppository rectally 2 (two) times daily.    . polyethylene glycol (MIRALAX / GLYCOLAX) packet Take 17 g by mouth daily as needed for mild constipation. 14 each 0  . ramipril (ALTACE) 5 MG capsule TAKE 1 CAPSULE BY MOUTH ONCE DAILY 90 capsule 1  . sertraline (ZOLOFT) 50 MG tablet TAKE 1 TABLET BY MOUTH ONCE DAILY 90 tablet 1  . tamsulosin (FLOMAX) 0.4 MG CAPS capsule Take 1 capsule (0.4 mg total) by mouth daily. 30 capsule 0  . bifidobacterium infantis (ALIGN) capsule Take 1 capsule by mouth daily. (Patient not taking: Reported on 04/22/2018) 30 capsule 0  . mupirocin ointment (BACTROBAN) 2 % Apply 1 application topically 2 (two) times daily. (Patient not taking: Reported on 04/22/2018) 22 g 0  . nystatin cream (MYCOSTATIN) Apply 1 application topically 2 (two) times daily. Apply under breast (Patient not taking: Reported on 04/22/2018) 45 g 1  . triamcinolone cream (KENALOG) 0.5 % Apply topically 2 (two) times daily. (Patient not taking: Reported on 04/22/2018) 60 g 0   No current facility-administered medications on file prior  to visit.    There are no Patient Instructions on file for this visit. No follow-ups on file.  Sharese Manrique A Yalissa Fink, PA-C

## 2018-04-23 ENCOUNTER — Other Ambulatory Visit: Payer: Self-pay | Admitting: Internal Medicine

## 2018-04-27 ENCOUNTER — Encounter (INDEPENDENT_AMBULATORY_CARE_PROVIDER_SITE_OTHER): Payer: Self-pay | Admitting: Vascular Surgery

## 2018-04-27 ENCOUNTER — Ambulatory Visit (INDEPENDENT_AMBULATORY_CARE_PROVIDER_SITE_OTHER): Payer: Medicare Other | Admitting: Vascular Surgery

## 2018-04-27 ENCOUNTER — Ambulatory Visit (INDEPENDENT_AMBULATORY_CARE_PROVIDER_SITE_OTHER): Payer: Medicare Other

## 2018-04-27 VITALS — BP 97/55 | HR 85 | Resp 16 | Ht 60.0 in | Wt 122.0 lb

## 2018-04-27 DIAGNOSIS — R600 Localized edema: Secondary | ICD-10-CM

## 2018-04-27 DIAGNOSIS — S3289XA Fracture of other parts of pelvis, initial encounter for closed fracture: Secondary | ICD-10-CM

## 2018-04-27 DIAGNOSIS — M79605 Pain in left leg: Secondary | ICD-10-CM | POA: Diagnosis not present

## 2018-04-27 DIAGNOSIS — M79604 Pain in right leg: Secondary | ICD-10-CM

## 2018-04-27 DIAGNOSIS — M546 Pain in thoracic spine: Secondary | ICD-10-CM | POA: Diagnosis not present

## 2018-04-27 DIAGNOSIS — I739 Peripheral vascular disease, unspecified: Secondary | ICD-10-CM | POA: Diagnosis not present

## 2018-04-27 NOTE — Progress Notes (Signed)
Subjective:    Patient ID: Kayla Galloway, female    DOB: August 24, 1920, 84 y.o.   MRN: [id number] Chief Complaint  Patient presents with  . Follow-up    asap abi     Patient presents to review vascular studies.  The patient was last seen on April 22, 2018 at the request of her nursing residents for lower extremity pain.  The patient's symptoms are stable.  They have not worsened.  The patient does not ambulate a lot and she is relatively wheelchair bound.  She denies rest pain or ulcer formation to the bilateral lower extremity.  The patient recently did break her pelvis.  The patient does have a history of back pain and spinal degenerative joint disease.  The patient underwent a bilateral ABI which was notable for noncompressible vessels.  Biphasic tibials bilaterally.  The patient notes that she has not started to wear compression socks or elevate her legs as per my instructions during her last visit.  Patient denies any fever, nausea vomiting.  Review of Systems  Constitutional: Negative.   HENT: Negative.   Eyes: Negative.   Respiratory: Negative.   Cardiovascular: Positive for leg swelling.       Lower Extremity Pain  Gastrointestinal: Negative.   Endocrine: Negative.   Genitourinary: Negative.   Musculoskeletal: Negative.   Skin: Negative.   Allergic/Immunologic: Negative.   Neurological: Negative.   Hematological: Negative.   Psychiatric/Behavioral: Negative.       Objective:   Physical Exam  Constitutional: She is oriented to person, place, and time. She appears well-developed and well-nourished. No distress.  HENT:  Head: Normocephalic and atraumatic.  Right Ear: External ear normal.  Left Ear: External ear normal.  Eyes: Pupils are equal, round, and reactive to light. Conjunctivae and EOM are normal.  Neck: Normal range of motion.  Cardiovascular: Normal rate, regular rhythm, normal heart sounds and intact distal pulses.  Pulses:      Radial pulses are 2+ on the  right side, and 2+ on the left side.  Hard to palpate pedal pulses however the bilateral feet are warm  Pulmonary/Chest: Effort normal and breath sounds normal.  Musculoskeletal: Normal range of motion. She exhibits edema (1+ mild edema noted bilaterally).  Neurological: She is alert and oriented to person, place, and time.  Skin: She is not diaphoretic.  Moderate stasis dermatitis noted.  No fibrosis, cellulitis or active ulcerations noted at this time  Psychiatric: She has a normal mood and affect. Her behavior is normal. Judgment and thought content normal.  Vitals reviewed.  BP (!) 97/55 (BP Location: Left Arm)   Pulse 85   Resp 16   Ht 5' (1.524 m)   Wt 122 lb (55.3 kg)   BMI 23.83 kg/m   Past Medical History:  Diagnosis Date  . Anemia   . Arthritis   . Cancer ([place])    skin ca lesion of scalp- removed  . Depression   . DVT (deep venous thrombosis), left    bilateral, IVC filter 2010  . GERD (gastroesophageal reflux disease)   . Gout   . Hypercholesterolemia   . Hyperkalemia   . Hypertension    Dr. [name]  . Nephrolithiasis   . Obesity   . Osteoporosis   . Peripheral vascular disease ([place])   . 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 Spider veins  Social History   Socioeconomic History  . Marital status: Widowed    Spouse name: Not on file  . Number of children: 0  . Years of education: Not on file  . Highest education level: Not on file  Occupational History  . Not on file  Social Needs  . Financial resource strain: Not on file  . Food insecurity:    Worry: Not on file    Inability: Not on file  . Transportation needs:    Medical: Not on file    Non-medical: Not on file  Tobacco Use  . Smoking status: Never Smoker  . Smokeless tobacco: Never Used  Substance and Sexual Activity  . Alcohol use: No    Alcohol/week: 0.0 standard drinks  .  Drug use: No  . Sexual activity: Never  Lifestyle  . Physical activity:    Days per week: Not on file    Minutes per session: Not on file  . Stress: Not on file  Relationships  . Social connections:    Talks on phone: Not on file    Gets together: Not on file    Attends religious service: Not on file    Active member of club or organization: Not on file    Attends meetings of clubs or organizations: Not on file    Relationship status: Not on file  . Intimate partner violence:    Fear of current or ex partner: Not on file    Emotionally abused: Not on file    Physically abused: Not on file    Forced sexual activity: Not on file  Other Topics Concern  . Not on file  Social History Narrative  . Not on file   Past Surgical History:  Procedure Laterality Date  . CARDIAC CATHETERIZATION     2010 Does not see a cardiac  doctor  . Colonsopy    . DENTAL SURGERY    . ECTOPIC PREGNANCY SURGERY    . EYE SURGERY Bilateral 2011,2012   cataracts  . FRACTURE SURGERY  2009   hip, rod from knee to hip  . HEMORRHOID SURGERY    . IVC filter Left    pt states it has turned side ways and could not be removed.  Marland Kitchen KYPHOPLASTY  09/03/2012   per patient, she has had kypho x 2:  Surgeon: Winfield Cunas, MD;  Location: [facility];  Service: Neurosurgery;  Laterality: N/A;  Thoracic eight Kyphoplasty  . OPEN REDUCTION INTERNAL FIXATION (ORIF) DISTAL RADIAL FRACTURE Left 09/06/2015   Procedure: OPEN REDUCTION INTERNAL FIXATION (ORIF) DISTAL RADIAL FRACTURE;  Surgeon: Hessie Knows, MD;  Location: [facility];  Service: Orthopedics;  Laterality: Left;  . RIGHT OOPHORECTOMY     partial-  . SKIN CANCER EXCISION     top of head  . TONSILLECTOMY     age 29  . VERTEBROPLASTY  12/31/2011   Procedure: VERTEBROPLASTY;  Surgeon: Winfield Cunas, MD;  Location: [facility];  Service: Neurosurgery;  Laterality: N/A;  Thoracic eleven vertebroplasty   Family History  Problem Relation Age of Onset  . Heart  disease Father        myocardial infarction  . Heart disease Brother        myocardial infarction  . Lymphoma Sister   . Anesthesia problems Neg Hx   . Breast cancer Neg Hx   . Colon cancer Neg Hx    Allergies  Allergen Reactions  . Cefuroxime Axetil Rash    Pt states  that she does fine with Keflex.    . Doxycycline Nausea Only and Other (See Comments)    Reaction:  Weight loss   . Advil [Ibuprofen] Swelling  . Celebrex [Celecoxib] Nausea Only  . Daypro [Oxaprozin] Other (See Comments)    Reaction:  Dizziness   . Etodolac Nausea Only  . Macrobid [employer] Macro] Other (See Comments)    Reaction:  Burning   . Penicillins Other (See Comments)    Reaction:  Burning   . Percocet [Oxycodone-Acetaminophen] Nausea Only and Swelling  . Tramadol Other (See Comments)    Reaction:  Unknown   . Valium [Diazepam] Other (See Comments)    Reaction:  Dizziness    . Neosporin [Neomycin-Bacitracin Zn-Polymyx] Rash  . Vicodin [Hydrocodone-Acetaminophen] Nausea Only      Assessment & Plan:  Patient presents to review vascular studies.  The patient was last seen on April 22, 2018 at the request of her nursing residents for lower extremity pain.  The patient's symptoms are stable.  They have not worsened.  The patient does not ambulate a lot and she is relatively wheelchair bound.  She denies rest pain or ulcer formation to the bilateral lower extremity.  The patient recently did break her pelvis.  The patient does have a history of back pain and spinal degenerative joint disease.  The patient underwent a bilateral ABI which was notable for noncompressible vessels.  Biphasic tibials bilaterally.  The patient notes that she has not started to wear compression socks or elevate her legs as per my instructions during her last visit.  Patient denies any fever, nausea vomiting.  1. PAD (peripheral artery disease) (HCC) - Stable The patient has biphasic waveforms noted in the bilateral  tibial arteries. I do not feel that the patient's bilateral lower extremity discomfort is stemming from arterial insufficiency. The patient has a recent history of a broken pelvis and a past medical history of back pain and spinal degenerative joint disease. These 2 issues are strong contributing factors to bilateral lower extremity discomfort Since the patient did have medial calcification on ABI we will bring her back in 6 months and have her undergo bilateral arterial duplex to assess her arterial patency I have discussed with the patient at length the risk factors for and pathogenesis of atherosclerotic disease and encouraged a healthy diet, regular exercise regimen and blood pressure / glucose control.  The patient was encouraged to call the office in the interim if he experiences any claudication like symptoms, rest pain or ulcers to his feet / toes.  - VAS [place] LOWER EXTREMITY ARTERIAL DUPLEX; Future  2. Bilateral lower extremity edema - Stable The patient notes that she has not started to wear medical grade 1 compression socks as I had recommended during her last visit I have written another prescription for socks and included it with directions on how to wear socks in the patient's packet to be delivered back to her nursing residents The patient was encouraged to wear graduated compression stockings (20-30 mmHg) on a daily basis. The patient was instructed to begin wearing the stockings first thing in the morning and removing them in the evening. The patient was instructed specifically not to sleep in the stockings. Prescription given again.  In addition, behavioral modification including elevation during the day will be initiated. The patient will follow up in six months to asses conservative management.  Information on compression stockings was given to the patient. The patient was instructed to call the office in  the interim if any worsening edema or ulcerations to the legs, feet or toes  occurs. The patient expresses their understanding.  3. Fracture of oth parts of pelvis, init for clos fx (HCC) - Stable Contributing factor to the patient's bilateral lower extremity discomfort  4. Acute midline thoracic back pain - Stable Contributing factor to the patient's bilateral lower extremity discomfort  Current Outpatient Medications on File Prior to Visit  Medication Sig Dispense Refill  . acetaminophen (TYLENOL) 650 MG CR tablet Take 650 mg by mouth every 8 (eight) hours as needed for pain.     . Ca Carbonate-Mag Hydroxide (GERI-LANTA SUPREME) 400-135 MG/5ML SUSP Take by mouth 4 (four) times daily as needed.    . Calcium 600-200 MG-UNIT tablet Take 1 tablet by mouth 3 (three) times daily with meals.    . cholecalciferol (VITAMIN D) 1000 units tablet Take 1,000 Units by mouth daily.    Marland Kitchen docusate sodium (COLACE) 100 MG capsule Take 2 capsules (200 mg total) by mouth 2 (two) times daily. 10 capsule 0  . ferrous sulfate 325 (65 FE) MG tablet Take 325 mg by mouth every evening.    . furosemide (LASIX) 20 MG tablet Take 1 tablet (20 mg total) by mouth daily. 90 tablet 1  . Glucosamine-Chondroitin 250-200 MG TABS Take 1 tablet by mouth 2 (two) times daily.     Marland Kitchen omeprazole (PRILOSEC) 20 MG capsule TAKE 1 CAPSULE BY MOUTH ONCE DAILY 90 capsule 1  . ondansetron (ZOFRAN) 4 MG tablet Take 4 mg by mouth every 8 (eight) hours as needed for nausea or vomiting.    Marland Kitchen oxycodone (OXY-IR) 5 MG capsule Take 5 mg by mouth every 4 (four) hours as needed.    . phenylephrine (,USE FOR PREPARATION-H,) 0.25 % suppository Place 1 suppository rectally 2 (two) times daily.    . polyethylene glycol (MIRALAX / GLYCOLAX) packet Take 17 g by mouth daily as needed for mild constipation. 14 each 0  . ramipril (ALTACE) 5 MG capsule TAKE 1 CAPSULE BY MOUTH ONCE DAILY 90 capsule 1  . sertraline (ZOLOFT) 50 MG tablet TAKE 1 TABLET BY MOUTH ONCE DAILY 90 tablet 1  . tamsulosin (FLOMAX) 0.4 MG CAPS capsule Take 1  capsule (0.4 mg total) by mouth daily. 30 capsule 0  . bifidobacterium infantis (ALIGN) capsule Take 1 capsule by mouth daily. (Patient not taking: Reported on 04/22/2018) 30 capsule 0  . mupirocin ointment (BACTROBAN) 2 % Apply 1 application topically 2 (two) times daily. (Patient not taking: Reported on 04/22/2018) 22 g 0  . nystatin cream (MYCOSTATIN) Apply 1 application topically 2 (two) times daily. Apply under breast (Patient not taking: Reported on 04/22/2018) 45 g 1  . triamcinolone cream (KENALOG) 0.5 % Apply topically 2 (two) times daily. (Patient not taking: Reported on 04/22/2018) 60 g 0   No current facility-administered medications on file prior to visit.    There are no Patient Instructions on file for this visit. No follow-ups on file.  Calistro Rauf A Shirlee Whitmire, PA-C

## 2018-05-01 DIAGNOSIS — S32511D Fracture of superior rim of right pubis, subsequent encounter for fracture with routine healing: Secondary | ICD-10-CM | POA: Diagnosis not present

## 2018-05-01 DIAGNOSIS — I10 Essential (primary) hypertension: Secondary | ICD-10-CM | POA: Diagnosis not present

## 2018-05-01 DIAGNOSIS — Z85828 Personal history of other malignant neoplasm of skin: Secondary | ICD-10-CM | POA: Diagnosis not present

## 2018-05-01 DIAGNOSIS — M810 Age-related osteoporosis without current pathological fracture: Secondary | ICD-10-CM | POA: Diagnosis not present

## 2018-05-01 DIAGNOSIS — K219 Gastro-esophageal reflux disease without esophagitis: Secondary | ICD-10-CM | POA: Diagnosis not present

## 2018-05-01 DIAGNOSIS — S32591D Other specified fracture of right pubis, subsequent encounter for fracture with routine healing: Secondary | ICD-10-CM | POA: Diagnosis not present

## 2018-05-01 DIAGNOSIS — I739 Peripheral vascular disease, unspecified: Secondary | ICD-10-CM | POA: Diagnosis not present

## 2018-05-01 DIAGNOSIS — M1990 Unspecified osteoarthritis, unspecified site: Secondary | ICD-10-CM | POA: Diagnosis not present

## 2018-05-01 DIAGNOSIS — Z9181 History of falling: Secondary | ICD-10-CM | POA: Diagnosis not present

## 2018-05-03 ENCOUNTER — Telehealth: Payer: Self-pay

## 2018-05-03 DIAGNOSIS — Z85828 Personal history of other malignant neoplasm of skin: Secondary | ICD-10-CM | POA: Diagnosis not present

## 2018-05-03 DIAGNOSIS — M810 Age-related osteoporosis without current pathological fracture: Secondary | ICD-10-CM | POA: Diagnosis not present

## 2018-05-03 DIAGNOSIS — I739 Peripheral vascular disease, unspecified: Secondary | ICD-10-CM | POA: Diagnosis not present

## 2018-05-03 DIAGNOSIS — Z9181 History of falling: Secondary | ICD-10-CM | POA: Diagnosis not present

## 2018-05-03 DIAGNOSIS — S32511D Fracture of superior rim of right pubis, subsequent encounter for fracture with routine healing: Secondary | ICD-10-CM | POA: Diagnosis not present

## 2018-05-03 DIAGNOSIS — I10 Essential (primary) hypertension: Secondary | ICD-10-CM | POA: Diagnosis not present

## 2018-05-03 DIAGNOSIS — S32591D Other specified fracture of right pubis, subsequent encounter for fracture with routine healing: Secondary | ICD-10-CM | POA: Diagnosis not present

## 2018-05-03 DIAGNOSIS — M1990 Unspecified osteoarthritis, unspecified site: Secondary | ICD-10-CM | POA: Diagnosis not present

## 2018-05-03 DIAGNOSIS — K219 Gastro-esophageal reflux disease without esophagitis: Secondary | ICD-10-CM | POA: Diagnosis not present

## 2018-05-03 NOTE — Telephone Encounter (Signed)
Noted.  Let me know if I need to do anything.  ?

## 2018-05-03 NOTE — Telephone Encounter (Signed)
Copied from [place] [id number]. Topic: General - Other >> May 03, 2018  1:19 PM Yvette Rack wrote: Reason for CRM: Nurse Agustin Cree from [facility] [phone] calling to let [place] know that pt had home care services on Saturday for PT and skill nursing

## 2018-05-03 NOTE — Telephone Encounter (Signed)
I think this is just an FYI.

## 2018-05-06 DIAGNOSIS — M810 Age-related osteoporosis without current pathological fracture: Secondary | ICD-10-CM | POA: Diagnosis not present

## 2018-05-06 DIAGNOSIS — Z9181 History of falling: Secondary | ICD-10-CM | POA: Diagnosis not present

## 2018-05-06 DIAGNOSIS — M1990 Unspecified osteoarthritis, unspecified site: Secondary | ICD-10-CM | POA: Diagnosis not present

## 2018-05-06 DIAGNOSIS — I10 Essential (primary) hypertension: Secondary | ICD-10-CM | POA: Diagnosis not present

## 2018-05-06 DIAGNOSIS — S32511D Fracture of superior rim of right pubis, subsequent encounter for fracture with routine healing: Secondary | ICD-10-CM | POA: Diagnosis not present

## 2018-05-06 DIAGNOSIS — K219 Gastro-esophageal reflux disease without esophagitis: Secondary | ICD-10-CM | POA: Diagnosis not present

## 2018-05-06 DIAGNOSIS — Z85828 Personal history of other malignant neoplasm of skin: Secondary | ICD-10-CM | POA: Diagnosis not present

## 2018-05-06 DIAGNOSIS — S32591D Other specified fracture of right pubis, subsequent encounter for fracture with routine healing: Secondary | ICD-10-CM | POA: Diagnosis not present

## 2018-05-06 DIAGNOSIS — I739 Peripheral vascular disease, unspecified: Secondary | ICD-10-CM | POA: Diagnosis not present

## 2018-05-07 ENCOUNTER — Telehealth: Payer: Self-pay

## 2018-05-07 NOTE — Telephone Encounter (Signed)
I am going to call patients sister to check on her. Is there something that you recommend that they try OTC?

## 2018-05-07 NOTE — Telephone Encounter (Signed)
Not sure how long it has been since she had bowel movement.  If has been several days or more, than would recommend either dulcolax suppository of enema to get things moving.  If not improvement after one, may repeat.

## 2018-05-07 NOTE — Telephone Encounter (Signed)
LMTCB

## 2018-05-07 NOTE — Telephone Encounter (Signed)
Copied from [place] [id number]. Topic: General - Other >> May 07, 2018 11:21 AM Yvette Rack wrote: Reason for CRM: Pt sister Andris Flurry states pt came home from [facility] on 04/30/18 and she is not having any bowel movement. Carrie requests a call back from Dr. Nicki Reaper nurse. Cb# [phone]

## 2018-05-10 ENCOUNTER — Telehealth: Payer: Self-pay | Admitting: Internal Medicine

## 2018-05-10 NOTE — Telephone Encounter (Signed)
Called and gave verbal order for PT to Priddy at [facility]

## 2018-05-10 NOTE — Telephone Encounter (Signed)
Copied from [place] [id number]. Topic: Quick Communication - See Telephone Encounter >> May 10, 2018  9:23 AM Chauncey Mann A wrote: CRM for notification. See Telephone encounter for: 05/10/18.  Pt Orders:  2x per week for 6 weeks 1x per week for 3 weeks   [place]  [phone]

## 2018-05-10 NOTE — Telephone Encounter (Signed)
Spoke with patients sister. She has had a bowel movement.

## 2018-05-12 DIAGNOSIS — M810 Age-related osteoporosis without current pathological fracture: Secondary | ICD-10-CM | POA: Diagnosis not present

## 2018-05-12 DIAGNOSIS — K219 Gastro-esophageal reflux disease without esophagitis: Secondary | ICD-10-CM | POA: Diagnosis not present

## 2018-05-12 DIAGNOSIS — Z85828 Personal history of other malignant neoplasm of skin: Secondary | ICD-10-CM | POA: Diagnosis not present

## 2018-05-12 DIAGNOSIS — I739 Peripheral vascular disease, unspecified: Secondary | ICD-10-CM | POA: Diagnosis not present

## 2018-05-12 DIAGNOSIS — Z9181 History of falling: Secondary | ICD-10-CM | POA: Diagnosis not present

## 2018-05-12 DIAGNOSIS — S32511D Fracture of superior rim of right pubis, subsequent encounter for fracture with routine healing: Secondary | ICD-10-CM | POA: Diagnosis not present

## 2018-05-12 DIAGNOSIS — I10 Essential (primary) hypertension: Secondary | ICD-10-CM | POA: Diagnosis not present

## 2018-05-12 DIAGNOSIS — S32591D Other specified fracture of right pubis, subsequent encounter for fracture with routine healing: Secondary | ICD-10-CM | POA: Diagnosis not present

## 2018-05-12 DIAGNOSIS — M1990 Unspecified osteoarthritis, unspecified site: Secondary | ICD-10-CM | POA: Diagnosis not present

## 2018-05-14 ENCOUNTER — Telehealth: Payer: Self-pay | Admitting: Internal Medicine

## 2018-05-14 DIAGNOSIS — K219 Gastro-esophageal reflux disease without esophagitis: Secondary | ICD-10-CM | POA: Diagnosis not present

## 2018-05-14 DIAGNOSIS — S32511D Fracture of superior rim of right pubis, subsequent encounter for fracture with routine healing: Secondary | ICD-10-CM | POA: Diagnosis not present

## 2018-05-14 DIAGNOSIS — I10 Essential (primary) hypertension: Secondary | ICD-10-CM | POA: Diagnosis not present

## 2018-05-14 DIAGNOSIS — M1990 Unspecified osteoarthritis, unspecified site: Secondary | ICD-10-CM | POA: Diagnosis not present

## 2018-05-14 DIAGNOSIS — S32591D Other specified fracture of right pubis, subsequent encounter for fracture with routine healing: Secondary | ICD-10-CM | POA: Diagnosis not present

## 2018-05-14 DIAGNOSIS — I739 Peripheral vascular disease, unspecified: Secondary | ICD-10-CM | POA: Diagnosis not present

## 2018-05-14 DIAGNOSIS — M810 Age-related osteoporosis without current pathological fracture: Secondary | ICD-10-CM | POA: Diagnosis not present

## 2018-05-14 DIAGNOSIS — Z85828 Personal history of other malignant neoplasm of skin: Secondary | ICD-10-CM | POA: Diagnosis not present

## 2018-05-14 DIAGNOSIS — Z9181 History of falling: Secondary | ICD-10-CM | POA: Diagnosis not present

## 2018-05-14 NOTE — Telephone Encounter (Signed)
Home health nurse and patients sister are aware of changes. Advised to record pressures. Home health is going to fax over current med list

## 2018-05-14 NOTE — Telephone Encounter (Signed)
Medication change request.

## 2018-05-14 NOTE — Telephone Encounter (Signed)
Im not sure if Dr. Nicki Reaper wanted me to call or is she was meaning to send this back to you

## 2018-05-14 NOTE — Telephone Encounter (Signed)
If blood pressures running low, I am ok to decrease dose to 1/2 tablet q day.  Monitor pressures.  Let us know if persistent decrease.  Also, she needs a f/u appt here in the office.  Can they check labs.  If so, would like met b checked.  Also, need to confirm medications she is currently taking.  She was in hospital and then in SNF.  Can they send over or notify us of pts current medication regimen.

## 2018-05-14 NOTE — Telephone Encounter (Signed)
Copied from [place] [id number]. Topic: Quick Communication - See Telephone Encounter >> May 14, 2018  2:12 PM Bea Graff, NT wrote: CRM for notification. See Telephone encounter for: 05/14/18. Donnal Moat with [facility] calling and states that this pts bp has been running between 90-100 over 50-60 and she is wanting to see if some medication change can be done, especially with the lasix's. She suggests maybe cutting the dosage in half. States pt has no lower extremity swelling. CB#: [phone]

## 2018-05-14 NOTE — Telephone Encounter (Signed)
Left message for home health nurse to return call.

## 2018-05-14 NOTE — Telephone Encounter (Signed)
Confirmed with [facility] that patient is not having any symptoms. She was wondering if we could try cutting Lasix dose in half or changing it to 20 mg qod?

## 2018-05-17 ENCOUNTER — Ambulatory Visit: Payer: Self-pay | Admitting: *Deleted

## 2018-05-17 DIAGNOSIS — S32591D Other specified fracture of right pubis, subsequent encounter for fracture with routine healing: Secondary | ICD-10-CM | POA: Diagnosis not present

## 2018-05-17 DIAGNOSIS — Z85828 Personal history of other malignant neoplasm of skin: Secondary | ICD-10-CM | POA: Diagnosis not present

## 2018-05-17 DIAGNOSIS — K219 Gastro-esophageal reflux disease without esophagitis: Secondary | ICD-10-CM | POA: Diagnosis not present

## 2018-05-17 DIAGNOSIS — M1990 Unspecified osteoarthritis, unspecified site: Secondary | ICD-10-CM | POA: Diagnosis not present

## 2018-05-17 DIAGNOSIS — M810 Age-related osteoporosis without current pathological fracture: Secondary | ICD-10-CM | POA: Diagnosis not present

## 2018-05-17 DIAGNOSIS — I10 Essential (primary) hypertension: Secondary | ICD-10-CM | POA: Diagnosis not present

## 2018-05-17 DIAGNOSIS — I739 Peripheral vascular disease, unspecified: Secondary | ICD-10-CM | POA: Diagnosis not present

## 2018-05-17 DIAGNOSIS — S32511D Fracture of superior rim of right pubis, subsequent encounter for fracture with routine healing: Secondary | ICD-10-CM | POA: Diagnosis not present

## 2018-05-17 DIAGNOSIS — Z9181 History of falling: Secondary | ICD-10-CM | POA: Diagnosis not present

## 2018-05-17 NOTE — Telephone Encounter (Signed)
Pt's home P.T. 'Kayla Galloway' with [facility] calling to report pt's O2 Sat. dropping with minimal exertion.  Reports Friday with ambulation pt's  sat dropped from 90% to 70s, back up to 90's within 5 minutes. Today pts Sat 96%; after performing arm exercises with pt lying down, Sat went to 85% then 70%. After 5 minutes back at 96%.All values on RA, pt does not have home O2 and states "I don't want it."  Therapist  states pt does not exhibit any SOB, distress. BP 142/70 HR 76.  NT spoke with pt., denies SOB, dizziness. Caregiver states pt's BP has been "Better since taking only 1/2 lasix tab." States pt does get SOB ambulating 20 ft. Again states pt declines home O2.  Pt's P.T. is requesting O2 Sat. parameters indicative of when to stop activity during P.T. Session. Also states pt would benefit from home O2 PRN and if pt hears this from Dr. Nicki Reaper may agree.  Please adviseMila Merry, PT;  [phone] Caregiver:      [phone] Reason for Disposition . [1] MODERATE longstanding difficulty breathing (e.g., speaks in phrases, SOB even at rest, pulse 100-120) AND [2] SAME as normal    Report from pt's P.T. re: O2 sat dropping with exertion; returns to 90's within minutes.  Answer Assessment - Initial Assessment Questions 1. RESPIRATORY STATUS: "Describe your breathing?" (e.g., wheezing, shortness of breath, unable to speak, severe coughing)      Mild with exertion 2. ONSET: "When did this breathing problem begin?"      Ongoing 3. PATTERN "Does the difficult breathing come and go, or has it been constant since it started?"      With activity 4. SEVERITY: "How bad is your breathing?" (e.g., mild, moderate, severe)    - MILD: No SOB at rest, mild SOB with walking, speaks normally in sentences, can lay down, no retractions, pulse < 100.    - MODERATE: SOB at rest, SOB with minimal exertion and prefers to sit, cannot lie down flat, speaks in phrases, mild retractions, audible wheezing, pulse 100-120.   - SEVERE: Very SOB at rest, speaks in single words, struggling to breathe, sitting hunched forward, retractions, pulse > 120      mild 5. RECURRENT SYMPTOM: "Have you had difficulty breathing before?" If so, ask: "When was the last time?" and "What happened that time?"      Yes, H/O 6. CARDIAC HISTORY: "Do you have any history of heart disease?" (e.g., heart attack, angina, bypass surgery, angioplasty)       7. LUNG HISTORY: "Do you have any history of lung disease?"  (e.g., pulmonary embolus, asthma, emphysema)      8. CAUSE: "What do you think is causing the breathing problem?"       9. OTHER SYMPTOMS: "Do you have any other symptoms? (e.g., dizziness, runny nose, cough, chest pain, fever)    no  Protocols used: BREATHING DIFFICULTY-A-AH

## 2018-05-17 NOTE — Telephone Encounter (Signed)
Pt's home P.T. 'Cesario' with [facility] calling to report pt's O2 Sat. dropping with minimal exertion.  Reports Friday with ambulation pt's  sat dropped from 90% to 70s, back up to 90's within 5 minutes. Today pts Sat 96%; after performing arm exercises with pt lying down, Sat went to 85% then 70%. After 5 minutes back at 96%.All values on RA, pt does not have home O2 and states "I don't want it."  Therapist  states pt does not exhibit any SOB, distress. BP 142/70 HR 76.  NT spoke with pt., denies SOB, dizziness. Caregiver states pt's BP has been "Better since taking only 1/2 lasix tab." States pt does get SOB ambulating 20 ft. Again states pt declines home O2.  Pt's P.T. is requesting O2 Sat. parameters indicative of when to stop activity during P.T. Session. Also states pt would benefit from home O2 PRN and if pt hears this from Dr. Nicki Reaper may agree.  Please adviseMila Merry, PT;  [phone] Caregiver:      [phone]  Patient refused

## 2018-05-17 NOTE — Telephone Encounter (Signed)
Spoke with patients caregiver and sister. Advised evaluation at the ED after talking with Dr. Derrel Nip. Both of them stated that there was no way that she could get to the ED right now. Suggested EMS coming to get pt, pts sister stated that she was not the POA and could not handle all of this anymore. Advised that I call the POA who is the pts nephew, Elta Guadeloupe, and gave me his number. Attempted to reach POA and left him a message to call back.

## 2018-05-17 NOTE — Telephone Encounter (Signed)
Called and spoke to [place].  No change in pts status since her discharge from [facility].  Stays in bed most of the day.  Did not get up much at [facility].  Eating.  Wanted me to talk with Elta Guadeloupe.  Called Mark.  Unable to reach.  Left message for him to call back.

## 2018-05-18 ENCOUNTER — Telehealth: Payer: Self-pay

## 2018-05-18 NOTE — Telephone Encounter (Signed)
Copied from [place] [id number]. Topic: Quick Communication - Office Called Patient >> May 18, 2018 12:52 PM Hewitt Shorts wrote: Dr. Marcie Mowers patients power of attorney called stating that he received a call from Dr. Nicki Reaper requesting that he call back to discuss pt    Please call [phone] pt will be available from [phone]

## 2018-05-19 NOTE — Telephone Encounter (Signed)
Spoke with Kayla Galloway  Discussed current clinical condition - Kayla Galloway.  No change since discharge from SNF.  Therapy has documented decreased O2 sats which hopefully will qualify her for home oxygen.  Please contact home health to see if they can arrange O2 when ambulating.  If they are not able to set this up, then contact Kayla Galloway to see if she knows how we can get this arranged.  Also, discussed Kayla Galloway.  He will see if Kayla Galloway wants to get this arranged.  Also need a f/u on how her pressures are doing now since we decreased her lasix.

## 2018-05-19 NOTE — Telephone Encounter (Signed)
Spoke with Elta Guadeloupe regarding pt. He says that patient is not in any distress and has not had any issues since I spoke with [name] on Monday. She is not up moving around like she used to but she doesn't stay in bed all day either. She gets up and moves from room to room of the house but does not go outside or get up much by herself. She has a fear of falling or passing out. She is still eating well but he did mention that she is having frequent mood swings and she has her days and nights mixed up. She sleeps most of the day and stays up all night which is making it harder for her live in care giver to take care of her. Asked Elta Guadeloupe about being able to transport patient and he said that he did not think that would be possible because they had such a hard time getting her home and in the house when d/c from [facility]. He mentioned that he had heard about "Doctors making house calls" located in [place] who comes out to the house to see patients and was wondering your opinion regarding this. He does not think the patient is going to want to see anyone but you. Also talked to [facility] about patient getting oxygen to have at home since she had refused previously. He said he spoke with pt about this and she did not completely refuse but she did not want to wear it all the time. Alycia Rossetti that I would note everything discussed and send message for you to call him back. His number is [phone].

## 2018-05-20 DIAGNOSIS — Z85828 Personal history of other malignant neoplasm of skin: Secondary | ICD-10-CM | POA: Diagnosis not present

## 2018-05-20 DIAGNOSIS — Z9181 History of falling: Secondary | ICD-10-CM | POA: Diagnosis not present

## 2018-05-20 DIAGNOSIS — S32511D Fracture of superior rim of right pubis, subsequent encounter for fracture with routine healing: Secondary | ICD-10-CM | POA: Diagnosis not present

## 2018-05-20 DIAGNOSIS — I739 Peripheral vascular disease, unspecified: Secondary | ICD-10-CM | POA: Diagnosis not present

## 2018-05-20 DIAGNOSIS — M810 Age-related osteoporosis without current pathological fracture: Secondary | ICD-10-CM | POA: Diagnosis not present

## 2018-05-20 DIAGNOSIS — S32591D Other specified fracture of right pubis, subsequent encounter for fracture with routine healing: Secondary | ICD-10-CM | POA: Diagnosis not present

## 2018-05-20 DIAGNOSIS — K219 Gastro-esophageal reflux disease without esophagitis: Secondary | ICD-10-CM | POA: Diagnosis not present

## 2018-05-20 DIAGNOSIS — I10 Essential (primary) hypertension: Secondary | ICD-10-CM | POA: Diagnosis not present

## 2018-05-20 DIAGNOSIS — M1990 Unspecified osteoarthritis, unspecified site: Secondary | ICD-10-CM | POA: Diagnosis not present

## 2018-05-20 NOTE — Telephone Encounter (Signed)
Attempted to reach care giver on patients number to f/u on blood pressures but there was no voicemail set up to leave message

## 2018-05-24 ENCOUNTER — Other Ambulatory Visit: Payer: Self-pay | Admitting: Internal Medicine

## 2018-05-24 DIAGNOSIS — I10 Essential (primary) hypertension: Secondary | ICD-10-CM | POA: Diagnosis not present

## 2018-05-24 DIAGNOSIS — M1990 Unspecified osteoarthritis, unspecified site: Secondary | ICD-10-CM | POA: Diagnosis not present

## 2018-05-24 DIAGNOSIS — S32591D Other specified fracture of right pubis, subsequent encounter for fracture with routine healing: Secondary | ICD-10-CM | POA: Diagnosis not present

## 2018-05-24 DIAGNOSIS — Z9181 History of falling: Secondary | ICD-10-CM | POA: Diagnosis not present

## 2018-05-24 DIAGNOSIS — I739 Peripheral vascular disease, unspecified: Secondary | ICD-10-CM | POA: Diagnosis not present

## 2018-05-24 DIAGNOSIS — Z85828 Personal history of other malignant neoplasm of skin: Secondary | ICD-10-CM | POA: Diagnosis not present

## 2018-05-24 DIAGNOSIS — S32511D Fracture of superior rim of right pubis, subsequent encounter for fracture with routine healing: Secondary | ICD-10-CM | POA: Diagnosis not present

## 2018-05-24 DIAGNOSIS — K219 Gastro-esophageal reflux disease without esophagitis: Secondary | ICD-10-CM | POA: Diagnosis not present

## 2018-05-24 DIAGNOSIS — M810 Age-related osteoporosis without current pathological fracture: Secondary | ICD-10-CM | POA: Diagnosis not present

## 2018-05-26 DIAGNOSIS — M810 Age-related osteoporosis without current pathological fracture: Secondary | ICD-10-CM | POA: Diagnosis not present

## 2018-05-26 DIAGNOSIS — S32591D Other specified fracture of right pubis, subsequent encounter for fracture with routine healing: Secondary | ICD-10-CM | POA: Diagnosis not present

## 2018-05-26 DIAGNOSIS — S32511D Fracture of superior rim of right pubis, subsequent encounter for fracture with routine healing: Secondary | ICD-10-CM | POA: Diagnosis not present

## 2018-05-26 DIAGNOSIS — Z9181 History of falling: Secondary | ICD-10-CM | POA: Diagnosis not present

## 2018-05-26 DIAGNOSIS — Z85828 Personal history of other malignant neoplasm of skin: Secondary | ICD-10-CM | POA: Diagnosis not present

## 2018-05-26 DIAGNOSIS — K219 Gastro-esophageal reflux disease without esophagitis: Secondary | ICD-10-CM | POA: Diagnosis not present

## 2018-05-26 DIAGNOSIS — M1990 Unspecified osteoarthritis, unspecified site: Secondary | ICD-10-CM | POA: Diagnosis not present

## 2018-05-26 DIAGNOSIS — I739 Peripheral vascular disease, unspecified: Secondary | ICD-10-CM | POA: Diagnosis not present

## 2018-05-26 DIAGNOSIS — I10 Essential (primary) hypertension: Secondary | ICD-10-CM | POA: Diagnosis not present

## 2018-05-27 ENCOUNTER — Other Ambulatory Visit: Payer: Self-pay

## 2018-05-27 DIAGNOSIS — S32511D Fracture of superior rim of right pubis, subsequent encounter for fracture with routine healing: Secondary | ICD-10-CM | POA: Diagnosis not present

## 2018-05-27 DIAGNOSIS — K219 Gastro-esophageal reflux disease without esophagitis: Secondary | ICD-10-CM | POA: Diagnosis not present

## 2018-05-27 DIAGNOSIS — M1990 Unspecified osteoarthritis, unspecified site: Secondary | ICD-10-CM | POA: Diagnosis not present

## 2018-05-27 DIAGNOSIS — Z85828 Personal history of other malignant neoplasm of skin: Secondary | ICD-10-CM | POA: Diagnosis not present

## 2018-05-27 DIAGNOSIS — I739 Peripheral vascular disease, unspecified: Secondary | ICD-10-CM | POA: Diagnosis not present

## 2018-05-27 DIAGNOSIS — S32591D Other specified fracture of right pubis, subsequent encounter for fracture with routine healing: Secondary | ICD-10-CM | POA: Diagnosis not present

## 2018-05-27 DIAGNOSIS — Z9181 History of falling: Secondary | ICD-10-CM | POA: Diagnosis not present

## 2018-05-27 DIAGNOSIS — I10 Essential (primary) hypertension: Secondary | ICD-10-CM | POA: Diagnosis not present

## 2018-05-27 DIAGNOSIS — M810 Age-related osteoporosis without current pathological fracture: Secondary | ICD-10-CM | POA: Diagnosis not present

## 2018-05-27 NOTE — Patient Outreach (Signed)
[facility]) Care Management  05/27/2018  Kayla Galloway 12-Jan-1921 [id number]   Medication Adherence call to [name] spoke with patient she has been in the hospital and the hospital was providing all her medication she is showing past due on Ramipril 5 mg patient has medication at this time and doe not need any at this time. [name] is showing past due under [place].   [place] Management Direct Dial [phone]  Fax [phone] [email or web address]

## 2018-06-01 ENCOUNTER — Telehealth: Payer: Self-pay

## 2018-06-01 IMAGING — CR DG SHOULDER 2+V*L*
1 series · 4 of 4 positions shown · non-contrast
Comparison: Left rib radiograph dated 02/03/2017 and the left ovary
radiograph dated 02/03/2017

CLINICAL DATA: [AGE] female with left shoulder pain.

EXAM:
LEFT SHOULDER - 2+ VIEW

[Series 1: dg shoulder left · 0.14mm/px · 4 of 4 slices shown]
[im 1/4]
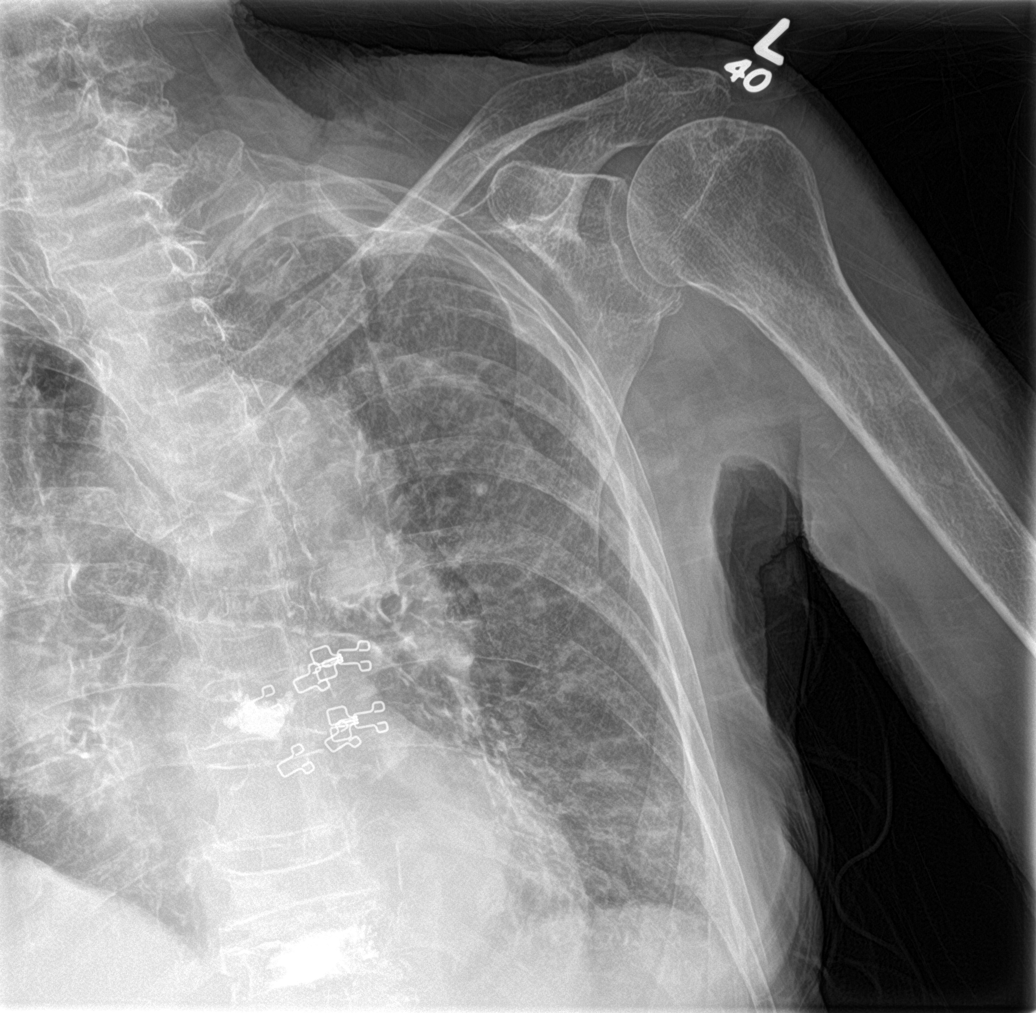
[im 2/4]
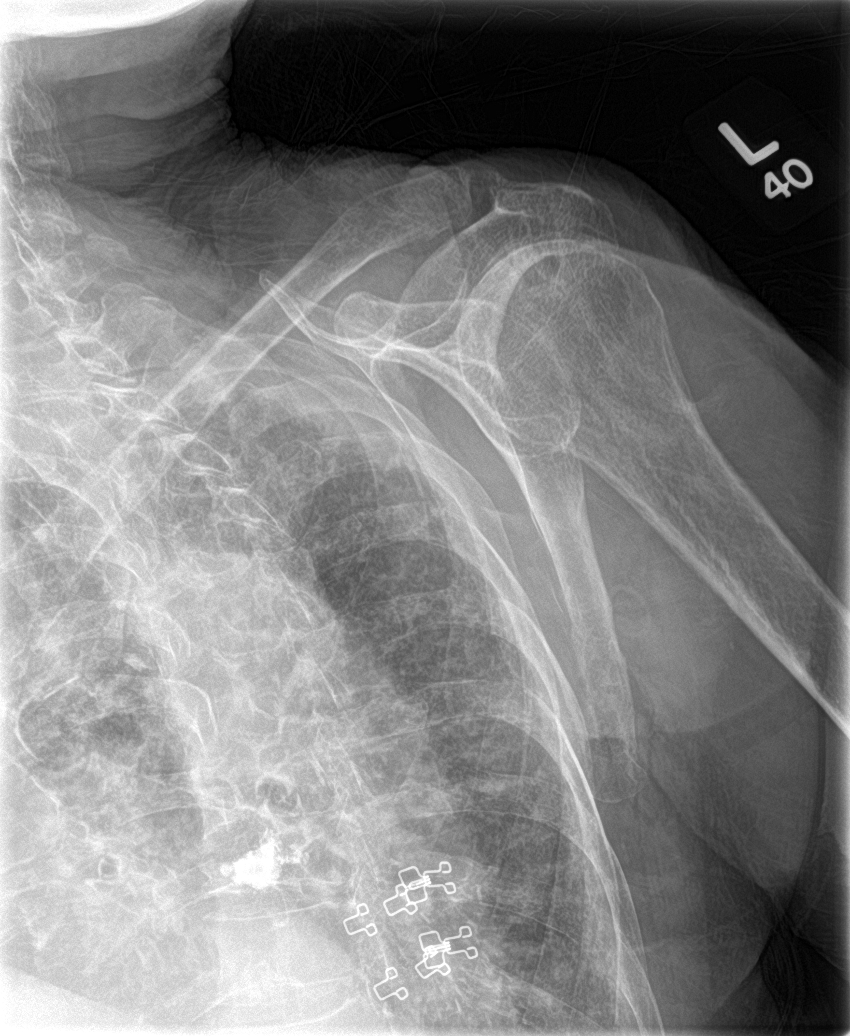
[im 3/4]
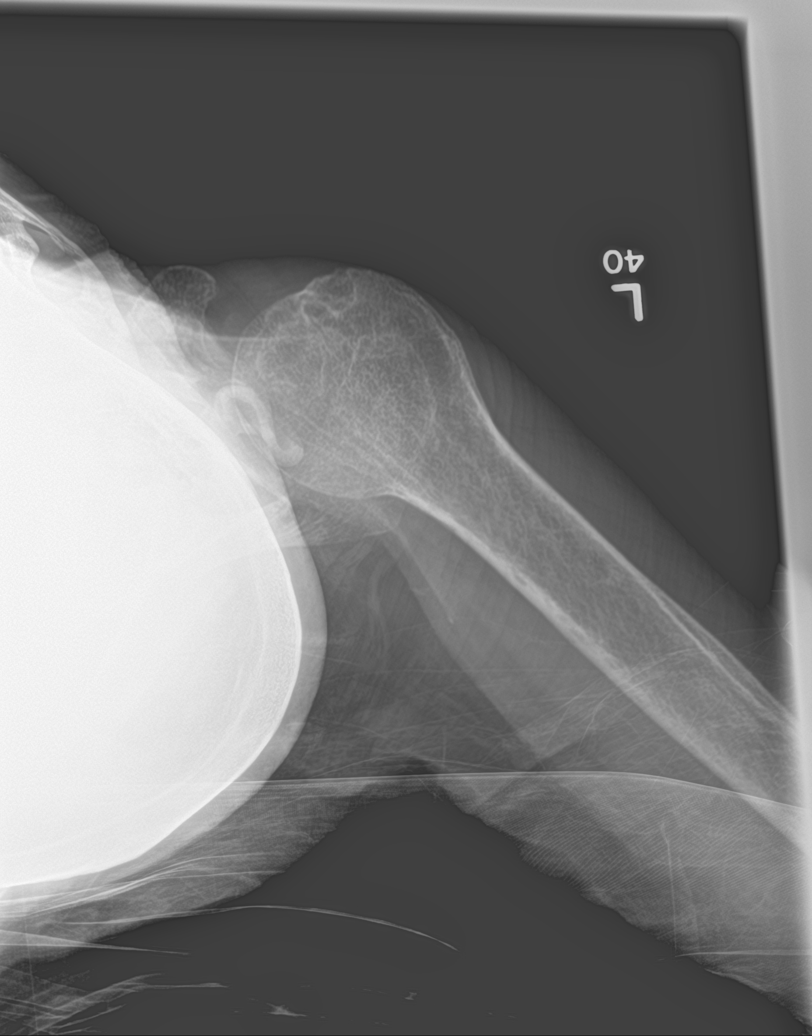
[im 4/4]
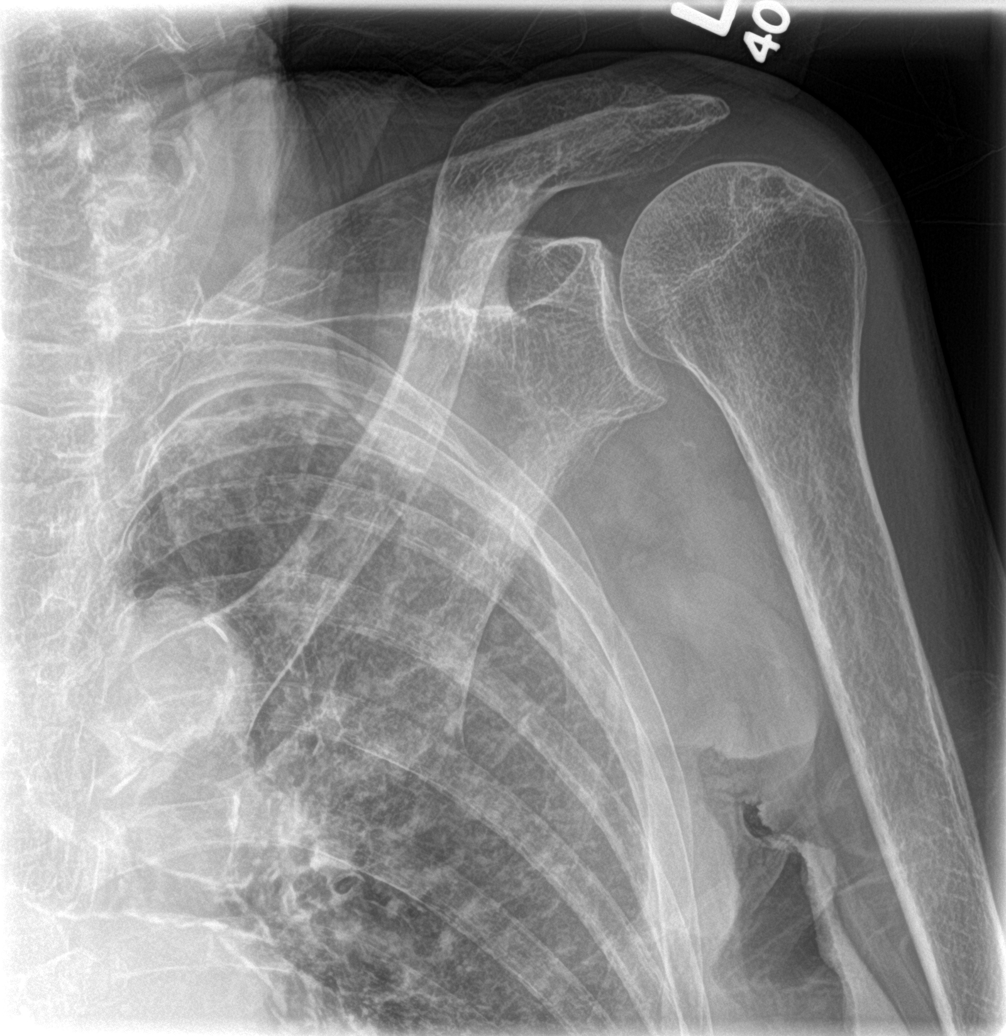

[4 of 4 positions shown; findings below may reference images not displayed]

FINDINGS: There is no acute fracture of the left shoulder. No dislocation.
There are chronic degenerative changes of the left humeral head. The
bones are osteopenic. Partially visualized minimally displaced left
lateral rib fracture. This is better evaluated on the rib series
radiograph. Multilevel thoracic compression deformities and
vertebroplasty changes. Stable cardiac silhouette.
IMPRESSION: 1. No acute fracture or dislocation of the left shoulder.
2. Minimally displaced fracture of the lateral aspect of a left rib.

## 2018-06-01 NOTE — Telephone Encounter (Signed)
Copied from [place] [phone]. Topic: General - Other >> Jun 01, 2018 11:01 AM Janace Aris A wrote: Reason for CRM: Cesanio from [facility] home care is seeing patient for PT but  he has noticed the Pt's oxygen saturation drops to 80 or 75 when they begin to walk. He would like to know should he continue to walk the patient or should they use the wheel-chair instead. He says Pt is declining the use of oxygen.     [phone]- Marcie Mowers POA (nephew)

## 2018-06-01 NOTE — Telephone Encounter (Signed)
Would you like patient to continue therapy

## 2018-06-01 NOTE — Telephone Encounter (Signed)
Would like to try and continue to get her out of bed if possible.  Can continue to encourage oxygen use.  If refusing and ox low - will need to stop.

## 2018-06-02 NOTE — Telephone Encounter (Signed)
Spoke with PT about patient. They have continued to encourage oxygen and even explained to patient that she did not have to wear the oxygen all the time, just when she is walking. Patient still continues to refuse. PT spoke with her nephew and he is aware and that she does not want oxygen. Advised PT that we would still like to continue with therapy and encourage oxygen. Told PT that if they do not feel comfortable walking her since her oxygen is dropping, it would be beneficial to have her move from bed to chair, etc. PT is seeing patient tomorrow, they are going to see how the visit goes and let us know.

## 2018-06-03 DIAGNOSIS — S32511D Fracture of superior rim of right pubis, subsequent encounter for fracture with routine healing: Secondary | ICD-10-CM | POA: Diagnosis not present

## 2018-06-03 DIAGNOSIS — M1990 Unspecified osteoarthritis, unspecified site: Secondary | ICD-10-CM | POA: Diagnosis not present

## 2018-06-03 DIAGNOSIS — K219 Gastro-esophageal reflux disease without esophagitis: Secondary | ICD-10-CM | POA: Diagnosis not present

## 2018-06-03 DIAGNOSIS — M810 Age-related osteoporosis without current pathological fracture: Secondary | ICD-10-CM | POA: Diagnosis not present

## 2018-06-03 DIAGNOSIS — I739 Peripheral vascular disease, unspecified: Secondary | ICD-10-CM | POA: Diagnosis not present

## 2018-06-03 DIAGNOSIS — Z85828 Personal history of other malignant neoplasm of skin: Secondary | ICD-10-CM | POA: Diagnosis not present

## 2018-06-03 DIAGNOSIS — Z9181 History of falling: Secondary | ICD-10-CM | POA: Diagnosis not present

## 2018-06-03 DIAGNOSIS — S32591D Other specified fracture of right pubis, subsequent encounter for fracture with routine healing: Secondary | ICD-10-CM | POA: Diagnosis not present

## 2018-06-03 DIAGNOSIS — I10 Essential (primary) hypertension: Secondary | ICD-10-CM | POA: Diagnosis not present

## 2018-06-05 DIAGNOSIS — K219 Gastro-esophageal reflux disease without esophagitis: Secondary | ICD-10-CM | POA: Diagnosis not present

## 2018-06-05 DIAGNOSIS — M810 Age-related osteoporosis without current pathological fracture: Secondary | ICD-10-CM | POA: Diagnosis not present

## 2018-06-05 DIAGNOSIS — S32591D Other specified fracture of right pubis, subsequent encounter for fracture with routine healing: Secondary | ICD-10-CM | POA: Diagnosis not present

## 2018-06-05 DIAGNOSIS — M1990 Unspecified osteoarthritis, unspecified site: Secondary | ICD-10-CM | POA: Diagnosis not present

## 2018-06-05 DIAGNOSIS — I10 Essential (primary) hypertension: Secondary | ICD-10-CM | POA: Diagnosis not present

## 2018-06-05 DIAGNOSIS — Z85828 Personal history of other malignant neoplasm of skin: Secondary | ICD-10-CM | POA: Diagnosis not present

## 2018-06-05 DIAGNOSIS — Z9181 History of falling: Secondary | ICD-10-CM | POA: Diagnosis not present

## 2018-06-05 DIAGNOSIS — S32511D Fracture of superior rim of right pubis, subsequent encounter for fracture with routine healing: Secondary | ICD-10-CM | POA: Diagnosis not present

## 2018-06-05 DIAGNOSIS — I739 Peripheral vascular disease, unspecified: Secondary | ICD-10-CM | POA: Diagnosis not present

## 2018-06-08 DIAGNOSIS — I10 Essential (primary) hypertension: Secondary | ICD-10-CM | POA: Diagnosis not present

## 2018-06-08 DIAGNOSIS — M810 Age-related osteoporosis without current pathological fracture: Secondary | ICD-10-CM | POA: Diagnosis not present

## 2018-06-08 DIAGNOSIS — Z85828 Personal history of other malignant neoplasm of skin: Secondary | ICD-10-CM | POA: Diagnosis not present

## 2018-06-08 DIAGNOSIS — M1990 Unspecified osteoarthritis, unspecified site: Secondary | ICD-10-CM | POA: Diagnosis not present

## 2018-06-08 DIAGNOSIS — S32591D Other specified fracture of right pubis, subsequent encounter for fracture with routine healing: Secondary | ICD-10-CM | POA: Diagnosis not present

## 2018-06-08 DIAGNOSIS — Z9181 History of falling: Secondary | ICD-10-CM | POA: Diagnosis not present

## 2018-06-08 DIAGNOSIS — I739 Peripheral vascular disease, unspecified: Secondary | ICD-10-CM | POA: Diagnosis not present

## 2018-06-08 DIAGNOSIS — S32511D Fracture of superior rim of right pubis, subsequent encounter for fracture with routine healing: Secondary | ICD-10-CM | POA: Diagnosis not present

## 2018-06-08 DIAGNOSIS — K219 Gastro-esophageal reflux disease without esophagitis: Secondary | ICD-10-CM | POA: Diagnosis not present

## 2018-06-10 DIAGNOSIS — I739 Peripheral vascular disease, unspecified: Secondary | ICD-10-CM | POA: Diagnosis not present

## 2018-06-10 DIAGNOSIS — M810 Age-related osteoporosis without current pathological fracture: Secondary | ICD-10-CM | POA: Diagnosis not present

## 2018-06-10 DIAGNOSIS — S32511D Fracture of superior rim of right pubis, subsequent encounter for fracture with routine healing: Secondary | ICD-10-CM | POA: Diagnosis not present

## 2018-06-10 DIAGNOSIS — Z85828 Personal history of other malignant neoplasm of skin: Secondary | ICD-10-CM | POA: Diagnosis not present

## 2018-06-10 DIAGNOSIS — M1990 Unspecified osteoarthritis, unspecified site: Secondary | ICD-10-CM | POA: Diagnosis not present

## 2018-06-10 DIAGNOSIS — S32591D Other specified fracture of right pubis, subsequent encounter for fracture with routine healing: Secondary | ICD-10-CM | POA: Diagnosis not present

## 2018-06-10 DIAGNOSIS — K219 Gastro-esophageal reflux disease without esophagitis: Secondary | ICD-10-CM | POA: Diagnosis not present

## 2018-06-10 DIAGNOSIS — I10 Essential (primary) hypertension: Secondary | ICD-10-CM | POA: Diagnosis not present

## 2018-06-10 DIAGNOSIS — Z9181 History of falling: Secondary | ICD-10-CM | POA: Diagnosis not present

## 2018-06-16 DIAGNOSIS — S32511D Fracture of superior rim of right pubis, subsequent encounter for fracture with routine healing: Secondary | ICD-10-CM | POA: Diagnosis not present

## 2018-06-16 DIAGNOSIS — Z9181 History of falling: Secondary | ICD-10-CM | POA: Diagnosis not present

## 2018-06-16 DIAGNOSIS — K219 Gastro-esophageal reflux disease without esophagitis: Secondary | ICD-10-CM | POA: Diagnosis not present

## 2018-06-16 DIAGNOSIS — Z85828 Personal history of other malignant neoplasm of skin: Secondary | ICD-10-CM | POA: Diagnosis not present

## 2018-06-16 DIAGNOSIS — I739 Peripheral vascular disease, unspecified: Secondary | ICD-10-CM | POA: Diagnosis not present

## 2018-06-16 DIAGNOSIS — S32591D Other specified fracture of right pubis, subsequent encounter for fracture with routine healing: Secondary | ICD-10-CM | POA: Diagnosis not present

## 2018-06-16 DIAGNOSIS — I10 Essential (primary) hypertension: Secondary | ICD-10-CM | POA: Diagnosis not present

## 2018-06-16 DIAGNOSIS — M810 Age-related osteoporosis without current pathological fracture: Secondary | ICD-10-CM | POA: Diagnosis not present

## 2018-06-16 DIAGNOSIS — M1990 Unspecified osteoarthritis, unspecified site: Secondary | ICD-10-CM | POA: Diagnosis not present

## 2018-06-17 DIAGNOSIS — M810 Age-related osteoporosis without current pathological fracture: Secondary | ICD-10-CM | POA: Diagnosis not present

## 2018-06-17 DIAGNOSIS — I10 Essential (primary) hypertension: Secondary | ICD-10-CM | POA: Diagnosis not present

## 2018-06-17 DIAGNOSIS — S32511D Fracture of superior rim of right pubis, subsequent encounter for fracture with routine healing: Secondary | ICD-10-CM | POA: Diagnosis not present

## 2018-06-17 DIAGNOSIS — S32591D Other specified fracture of right pubis, subsequent encounter for fracture with routine healing: Secondary | ICD-10-CM | POA: Diagnosis not present

## 2018-06-17 DIAGNOSIS — K219 Gastro-esophageal reflux disease without esophagitis: Secondary | ICD-10-CM | POA: Diagnosis not present

## 2018-06-17 DIAGNOSIS — Z9181 History of falling: Secondary | ICD-10-CM | POA: Diagnosis not present

## 2018-06-17 DIAGNOSIS — I739 Peripheral vascular disease, unspecified: Secondary | ICD-10-CM | POA: Diagnosis not present

## 2018-06-17 DIAGNOSIS — Z85828 Personal history of other malignant neoplasm of skin: Secondary | ICD-10-CM | POA: Diagnosis not present

## 2018-06-17 DIAGNOSIS — M1990 Unspecified osteoarthritis, unspecified site: Secondary | ICD-10-CM | POA: Diagnosis not present

## 2018-06-23 DIAGNOSIS — S32591D Other specified fracture of right pubis, subsequent encounter for fracture with routine healing: Secondary | ICD-10-CM | POA: Diagnosis not present

## 2018-06-23 DIAGNOSIS — M810 Age-related osteoporosis without current pathological fracture: Secondary | ICD-10-CM | POA: Diagnosis not present

## 2018-06-23 DIAGNOSIS — I739 Peripheral vascular disease, unspecified: Secondary | ICD-10-CM | POA: Diagnosis not present

## 2018-06-23 DIAGNOSIS — I10 Essential (primary) hypertension: Secondary | ICD-10-CM | POA: Diagnosis not present

## 2018-06-23 DIAGNOSIS — M1990 Unspecified osteoarthritis, unspecified site: Secondary | ICD-10-CM | POA: Diagnosis not present

## 2018-06-23 DIAGNOSIS — Z85828 Personal history of other malignant neoplasm of skin: Secondary | ICD-10-CM | POA: Diagnosis not present

## 2018-06-23 DIAGNOSIS — S32511D Fracture of superior rim of right pubis, subsequent encounter for fracture with routine healing: Secondary | ICD-10-CM | POA: Diagnosis not present

## 2018-06-23 DIAGNOSIS — K219 Gastro-esophageal reflux disease without esophagitis: Secondary | ICD-10-CM | POA: Diagnosis not present

## 2018-06-23 DIAGNOSIS — Z9181 History of falling: Secondary | ICD-10-CM | POA: Diagnosis not present

## 2018-06-29 DIAGNOSIS — I739 Peripheral vascular disease, unspecified: Secondary | ICD-10-CM | POA: Diagnosis not present

## 2018-06-29 DIAGNOSIS — M1990 Unspecified osteoarthritis, unspecified site: Secondary | ICD-10-CM | POA: Diagnosis not present

## 2018-06-29 DIAGNOSIS — I10 Essential (primary) hypertension: Secondary | ICD-10-CM | POA: Diagnosis not present

## 2018-06-29 DIAGNOSIS — M810 Age-related osteoporosis without current pathological fracture: Secondary | ICD-10-CM | POA: Diagnosis not present

## 2018-06-29 DIAGNOSIS — Z9181 History of falling: Secondary | ICD-10-CM | POA: Diagnosis not present

## 2018-06-29 DIAGNOSIS — S32591D Other specified fracture of right pubis, subsequent encounter for fracture with routine healing: Secondary | ICD-10-CM | POA: Diagnosis not present

## 2018-06-29 DIAGNOSIS — S32511D Fracture of superior rim of right pubis, subsequent encounter for fracture with routine healing: Secondary | ICD-10-CM | POA: Diagnosis not present

## 2018-06-29 DIAGNOSIS — K219 Gastro-esophageal reflux disease without esophagitis: Secondary | ICD-10-CM | POA: Diagnosis not present

## 2018-06-29 DIAGNOSIS — Z85828 Personal history of other malignant neoplasm of skin: Secondary | ICD-10-CM | POA: Diagnosis not present

## 2018-06-30 ENCOUNTER — Telehealth: Payer: Self-pay

## 2018-06-30 ENCOUNTER — Ambulatory Visit: Payer: Self-pay | Admitting: Internal Medicine

## 2018-06-30 NOTE — Telephone Encounter (Signed)
Copied from [place] [phone]. Topic: General - Other >> Jun 30, 2018 10:51 AM Antonieta Iba C wrote: Reason for CRM: pt's sister [name] is calling in to make provider aware that pt has decided to go ahead and use oxygen. Pt/ sister would like further assistance.   CB: [phone]

## 2018-06-30 NOTE — Telephone Encounter (Signed)
This is the patient we were discussing with Dr. Nicki Reaper. Patient came home from [facility]. They were trying to send her home on oxygen but pt has been refusing. Pt is now requesting to have oxygen. Home health and PT has reported to [place] per phone note that her O2 sats were dropping while up moving around but not sure if we can use that for documentation

## 2018-06-30 NOTE — Telephone Encounter (Signed)
Noted.  Needs appt for evaluation for need for O2.  Will need to be walked, etc.  Per [name], sounds like office visit is needed and do walk while here.

## 2018-06-30 NOTE — Telephone Encounter (Signed)
From reading the discharge report from [facility] patient did not DC on 02 to SNF , there are no records reporting she was DC with 02. If 02 qualifications were attained at SNF we would need to evaluate patient in office for 02 qualifications.

## 2018-07-01 NOTE — Telephone Encounter (Signed)
This is just an FYI, I have already talked with you about this.    Spoke with patients sister and advised that patient would have to have office visit in order to do walk and add diagnosis to continue with process to get oxygen. [name] stated that there is absolutely no way that is possible. The patient does not get out of the bed much at all and does not walk well. She also said that Elta Guadeloupe [facility]) is out of town but she would like for me to discuss this with him. I advised that he had mentioned previously about Doctors making house calls but if pt began being followed by them we would not be able to be her PCP. Advised I would call Elta Guadeloupe once he has returned to town.

## 2018-07-01 NOTE — Telephone Encounter (Signed)
Noted.  Can as [name] if any other options for pt.

## 2018-07-02 NOTE — Telephone Encounter (Signed)
Spoke with patient care giver and advised Huey Romans would be contacting them for patient 02 qualifications.

## 2018-07-02 NOTE — Telephone Encounter (Signed)
Faxed order to [facility] for Over night pulse oximetry.

## 2018-07-02 NOTE — Telephone Encounter (Signed)
Patients family states that there is no way for them to bring pt to an appt with Dr. Nicki Reaper. Is there another way for Kayla Galloway to be able to get her walked and order O2? I am going to call Elta Guadeloupe (her POA) once I have found out if we can do anything to help them or if they should reach out to [employer] calls. POA is out of town right now and unable to discuss this per patients sister.

## 2018-07-02 NOTE — Telephone Encounter (Signed)
Routed to Dr. Nicki Reaper in error. This was supposed to go to [place]

## 2018-07-05 DIAGNOSIS — D485 Neoplasm of uncertain behavior of skin: Secondary | ICD-10-CM | POA: Diagnosis not present

## 2018-07-05 DIAGNOSIS — C44321 Squamous cell carcinoma of skin of nose: Secondary | ICD-10-CM | POA: Diagnosis not present

## 2018-07-05 DIAGNOSIS — Z85828 Personal history of other malignant neoplasm of skin: Secondary | ICD-10-CM | POA: Diagnosis not present

## 2018-07-05 DIAGNOSIS — L304 Erythema intertrigo: Secondary | ICD-10-CM | POA: Diagnosis not present

## 2018-07-05 DIAGNOSIS — Z86007 Personal history of in-situ neoplasm of skin: Secondary | ICD-10-CM | POA: Diagnosis not present

## 2018-07-05 DIAGNOSIS — L820 Inflamed seborrheic keratosis: Secondary | ICD-10-CM | POA: Diagnosis not present

## 2018-07-06 NOTE — Telephone Encounter (Signed)
Called patient. She stated she does not have oxygen in the home and has not used oxygen since she came home from [facility].

## 2018-07-06 NOTE — Telephone Encounter (Signed)
Talked with Huey Romans they will reschedule to go  To the home.

## 2018-07-06 NOTE — Telephone Encounter (Signed)
Kayla Galloway went to go into the home but patient says she has Oxygen in the home.

## 2018-07-07 ENCOUNTER — Telehealth: Payer: Self-pay

## 2018-07-07 NOTE — Telephone Encounter (Signed)
Copied from [place] [phone]. Topic: General - Inquiry >> Jul 07, 2018 11:18 AM Vernona Rieger wrote: Reason for CRM: Barrie Lyme would like Dr Nicki Reaper to personally call her. She said she just needs her to call immediatly. She would not give me any other information. Please advise

## 2018-07-07 NOTE — Telephone Encounter (Signed)
Patients sister is in the office at this time talking to [facility]

## 2018-07-10 ENCOUNTER — Encounter: Payer: Self-pay | Admitting: Internal Medicine

## 2018-07-13 DIAGNOSIS — J449 Chronic obstructive pulmonary disease, unspecified: Secondary | ICD-10-CM | POA: Diagnosis not present

## 2018-07-14 ENCOUNTER — Telehealth: Payer: Self-pay

## 2018-07-14 ENCOUNTER — Ambulatory Visit: Payer: Self-pay | Admitting: Internal Medicine

## 2018-07-14 NOTE — Telephone Encounter (Signed)
Please advise where to schedule.

## 2018-07-14 NOTE — Telephone Encounter (Signed)
Copied from [place] [id number]. Topic: Appointment Scheduling - Scheduling Inquiry for Clinic >> Jul 14, 2018 11:23 AM Bea Graff, NT wrote: Reason for CRM: Pt was unable to come in for an appt today due to not feeling well and her sister would like to see if she can be worked back in on a different day with Dr. Nicki Reaper. Please advise.

## 2018-07-19 ENCOUNTER — Other Ambulatory Visit: Payer: Self-pay | Admitting: Internal Medicine

## 2018-07-19 NOTE — Telephone Encounter (Signed)
Pt scheduled  

## 2018-07-20 ENCOUNTER — Ambulatory Visit (INDEPENDENT_AMBULATORY_CARE_PROVIDER_SITE_OTHER): Payer: Medicare Other | Admitting: Internal Medicine

## 2018-07-20 VITALS — BP 118/66 | HR 66 | Temp 97.5°F | Resp 18 | Wt 115.2 lb

## 2018-07-20 DIAGNOSIS — R600 Localized edema: Secondary | ICD-10-CM | POA: Diagnosis not present

## 2018-07-20 DIAGNOSIS — I10 Essential (primary) hypertension: Secondary | ICD-10-CM | POA: Diagnosis not present

## 2018-07-20 DIAGNOSIS — D649 Anemia, unspecified: Secondary | ICD-10-CM

## 2018-07-20 DIAGNOSIS — R9389 Abnormal findings on diagnostic imaging of other specified body structures: Secondary | ICD-10-CM

## 2018-07-20 DIAGNOSIS — E7800 Pure hypercholesterolemia, unspecified: Secondary | ICD-10-CM

## 2018-07-20 DIAGNOSIS — K219 Gastro-esophageal reflux disease without esophagitis: Secondary | ICD-10-CM | POA: Diagnosis not present

## 2018-07-20 DIAGNOSIS — R0902 Hypoxemia: Secondary | ICD-10-CM

## 2018-07-20 DIAGNOSIS — M8000XD Age-related osteoporosis with current pathological fracture, unspecified site, subsequent encounter for fracture with routine healing: Secondary | ICD-10-CM

## 2018-07-20 DIAGNOSIS — N289 Disorder of kidney and ureter, unspecified: Secondary | ICD-10-CM

## 2018-07-20 LAB — BASIC METABOLIC PANEL
BUN: 35 mg/dL — ABNORMAL HIGH (ref 6–23)
CO2: 25 meq/L (ref 19–32)
CREATININE: 1.01 mg/dL (ref 0.40–1.20)
Calcium: 9.1 mg/dL (ref 8.4–10.5)
Chloride: 106 mEq/L (ref 96–112)
GFR: 54.12 mL/min — ABNORMAL LOW (ref >60.00)
GLUCOSE: 104 mg/dL — AB (ref 70–99)
Potassium: 4.3 mEq/L (ref 3.5–5.1)
Sodium: 141 mEq/L (ref 135–145)

## 2018-07-20 LAB — HEPATIC FUNCTION PANEL
ALT: 11 U/L (ref 0–35)
AST: 20 U/L (ref 0–37)
Albumin: 4.2 g/dL (ref 3.5–5.2)
Alkaline Phosphatase: 76 U/L (ref 39–117)
BILIRUBIN DIRECT: 0.1 mg/dL (ref 0.0–0.3)
BILIRUBIN TOTAL: 0.3 mg/dL (ref 0.2–1.2)
Total Protein: 7.2 g/dL (ref 6.0–8.3)

## 2018-07-20 LAB — CBC WITH DIFFERENTIAL/PLATELET
Basophils Absolute: 0 10*3/uL (ref 0.0–0.1)
Basophils Relative: 0.4 % (ref 0.0–3.0)
EOS ABS: 0.1 10*3/uL (ref 0.0–0.7)
Eosinophils Relative: 1.7 % (ref 0.0–5.0)
HCT: 33 % — ABNORMAL LOW (ref 36.0–46.0)
Hemoglobin: 11.1 g/dL — ABNORMAL LOW (ref 12.0–15.0)
LYMPHS ABS: 1.6 10*3/uL (ref 0.7–4.0)
Lymphocytes Relative: 24.2 % (ref 12.0–46.0)
MCHC: 33.6 g/dL (ref 30.0–36.0)
MCV: 95.3 fl (ref 78.0–100.0)
MONO ABS: 0.8 10*3/uL (ref 0.1–1.0)
Monocytes Relative: 12.2 % — ABNORMAL HIGH (ref 3.0–12.0)
NEUTROS ABS: 4.2 10*3/uL (ref 1.4–7.7)
NEUTROS PCT: 61.5 % (ref 43.0–77.0)
PLATELETS: 153 10*3/uL (ref 150.0–400.0)
RBC: 3.46 Mil/uL — ABNORMAL LOW (ref 3.87–5.11)
RDW: 15.2 % (ref 11.5–15.5)
WBC: 6.8 10*3/uL (ref 4.0–10.5)

## 2018-07-20 LAB — FERRITIN: FERRITIN: 181.7 ng/mL (ref 10.0–291.0)

## 2018-07-20 MED ORDER — RAMIPRIL 2.5 MG PO CAPS: 2.5000 mg | ORAL_CAPSULE | Freq: Every day | ORAL | 2 refills | Status: DC

## 2018-07-20 NOTE — Patient Instructions (Signed)
Stop lasix  Decrease ramipril to 2.5mg  per day.  (stop the 5mg  ramipril).

## 2018-07-20 NOTE — Progress Notes (Signed)
Patient ID: [name], female   DOB: Dec 26, 1920, 84 y.o.   MRN: [id number]   Subjective:    Patient ID: Kayla Galloway, female    DOB: 1920/12/10, 84 y.o.   MRN: [id number]  HPI  Patient here as a work in with concerns regarding DOE and hypoxia.  She is accompanied by her caretaker and her sister.  History obtained from all of them.  She recently suffered pubic ramus fractures and was admitted 03/22/18.  Also found to have acute respiratory failure with hypoxemia.  She was admitted 03/22/18 and treated with abx.  Saw orthopedics. Recommended non operative/supportive care.  Was admitted to rehab and discharged home.  Has 24 hour caretaker.  Was working with PT initially (at home).  Noted to have decreased blood pressure and decreased oxygen saturation - with therapy.  Still has sob with increased activity/minimal activity.  Noted to have decreased O2 saturation with ambulation.  Needs oxygen.  Has been reluctant to use.  Discussed with her today.  Discussed the need for oxygen with exertion.  No chest pain.  No increased cough.  No acid reflux.  No abdominal pain.  Rash beneath her breast.  No hip or pelvic pain. Still with swelling in her lower extremities, but has improved.  Wounds healed.     Past Medical History:  Diagnosis Date  . Anemia   . Arthritis   . Cancer ([place])    skin ca lesion of scalp- removed  . Depression   . DVT (deep venous thrombosis), left    bilateral, IVC filter 2010  . GERD (gastroesophageal reflux disease)   . Gout   . Hypercholesterolemia   . Hyperkalemia   . Hypertension    Dr. [name]  . Nephrolithiasis   . Obesity   . Osteoporosis   . Peripheral vascular disease ([place])   . 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 Spider veins    Past Surgical History:  Procedure Laterality Date  . CARDIAC CATHETERIZATION     2010 Does not see a cardiac  doctor   . Colonsopy    . DENTAL SURGERY    . ECTOPIC PREGNANCY SURGERY    . EYE SURGERY Bilateral 2011,2012   cataracts  . FRACTURE SURGERY  2009   hip, rod from knee to hip  . HEMORRHOID SURGERY    . IVC filter Left    pt states it has turned side ways and could not be removed.  Marland Kitchen KYPHOPLASTY  09/03/2012   per patient, she has had kypho x 2:  Surgeon: Winfield Cunas, MD;  Location: [place] NEURO ORS;  Service: Neurosurgery;  Laterality: N/A;  Thoracic eight Kyphoplasty  . OPEN REDUCTION INTERNAL FIXATION (ORIF) DISTAL RADIAL FRACTURE Left 09/06/2015   Procedure: OPEN REDUCTION INTERNAL FIXATION (ORIF) DISTAL RADIAL FRACTURE;  Surgeon: Hessie Knows, MD;  Location: [facility] ORS;  Service: Orthopedics;  Laterality: Left;  . RIGHT OOPHORECTOMY     partial-  . SKIN CANCER EXCISION     top of head  . TONSILLECTOMY     age 61  . VERTEBROPLASTY  12/31/2011   Procedure: VERTEBROPLASTY;  Surgeon: Winfield Cunas, MD;  Location: [place] NEURO ORS;  Service: Neurosurgery;  Laterality: N/A;  Thoracic eleven vertebroplasty   Family History  Problem Relation Age of Onset  . Heart disease Father        myocardial  infarction  . Heart disease Brother        myocardial infarction  . Lymphoma Sister   . Anesthesia problems Neg Hx   . Breast cancer Neg Hx   . Colon cancer Neg Hx    Social History   Socioeconomic History  . Marital status: Widowed    Spouse name: Not on file  . Number of children: 0  . Years of education: Not on file  . Highest education level: Not on file  Occupational History  . Not on file  Social Needs  . Financial resource strain: Not on file  . Food insecurity:    Worry: Not on file    Inability: Not on file  . Transportation needs:    Medical: Not on file    Non-medical: Not on file  Tobacco Use  . Smoking status: Never Smoker  . Smokeless tobacco: Never Used  Substance and Sexual Activity  . Alcohol use: No    Alcohol/week: 0.0 standard drinks  . Drug use: No  . Sexual  activity: Never  Lifestyle  . Physical activity:    Days per week: Not on file    Minutes per session: Not on file  . Stress: Not on file  Relationships  . Social connections:    Talks on phone: Not on file    Gets together: Not on file    Attends religious service: Not on file    Active member of club or organization: Not on file    Attends meetings of clubs or organizations: Not on file    Relationship status: Not on file  Other Topics Concern  . Not on file  Social History Narrative  . Not on file    Outpatient Encounter Medications as of 07/20/2018  Medication Sig  . acetaminophen (TYLENOL) 650 MG CR tablet Take 650 mg by mouth every 8 (eight) hours as needed for pain.   . bifidobacterium infantis (ALIGN) capsule Take 1 capsule by mouth daily. (Patient not taking: Reported on 04/22/2018)  . Ca Carbonate-Mag Hydroxide (GERI-LANTA SUPREME) 400-135 MG/5ML SUSP Take by mouth 4 (four) times daily as needed.  . Calcium 600-200 MG-UNIT tablet Take 1 tablet by mouth 3 (three) times daily with meals.  . cholecalciferol (VITAMIN D) 1000 units tablet Take 1,000 Units by mouth daily.  Marland Kitchen docusate sodium (COLACE) 100 MG capsule Take 2 capsules (200 mg total) by mouth 2 (two) times daily.  . ferrous sulfate 325 (65 FE) MG tablet Take 325 mg by mouth every evening.  . furosemide (LASIX) 20 MG tablet TAKE 1 TABLET BY MOUTH ONCE DAILY  . Glucosamine-Chondroitin 250-200 MG TABS Take 1 tablet by mouth 2 (two) times daily.   . mupirocin ointment (BACTROBAN) 2 % Apply 1 application topically 2 (two) times daily. (Patient not taking: Reported on 04/22/2018)  . nystatin cream (MYCOSTATIN) Apply 1 application topically 2 (two) times daily. Apply under breast (Patient not taking: Reported on 04/22/2018)  . omeprazole (PRILOSEC) 20 MG capsule TAKE 1 CAPSULE BY MOUTH ONCE DAILY  . ondansetron (ZOFRAN) 4 MG tablet Take 4 mg by mouth every 8 (eight) hours as needed for nausea or vomiting.  Marland Kitchen oxycodone (OXY-IR) 5  MG capsule Take 5 mg by mouth every 4 (four) hours as needed.  . phenylephrine (,USE FOR PREPARATION-H,) 0.25 % suppository Place 1 suppository rectally 2 (two) times daily.  . polyethylene glycol (MIRALAX / GLYCOLAX) packet Take 17 g by mouth daily as needed for mild constipation.  . ramipril (ALTACE)  2.5 MG capsule Take 1 capsule (2.5 mg total) by mouth daily.  . sertraline (ZOLOFT) 50 MG tablet TAKE 1 TABLET BY MOUTH ONCE DAILY  . tamsulosin (FLOMAX) 0.4 MG CAPS capsule Take 1 capsule (0.4 mg total) by mouth daily.  Marland Kitchen triamcinolone cream (KENALOG) 0.5 % Apply topically 2 (two) times daily. (Patient not taking: Reported on 04/22/2018)  . [DISCONTINUED] ramipril (ALTACE) 5 MG capsule TAKE 1 CAPSULE BY MOUTH ONCE DAILY   No facility-administered encounter medications on file as of 07/20/2018.     Review of Systems  Constitutional: Positive for fatigue. Negative for appetite change and unexpected weight change.  HENT: Negative for congestion and sinus pressure.   Respiratory: Positive for shortness of breath. Negative for cough and chest tightness.   Cardiovascular: Positive for leg swelling. Negative for chest pain and palpitations.  Gastrointestinal: Negative for abdominal pain, diarrhea, nausea and vomiting.  Genitourinary: Negative for difficulty urinating and dysuria.  Musculoskeletal: Negative for myalgias and neck pain.       No hip pain or pelvic pain.    Skin: Negative for color change and rash.  Neurological: Negative for dizziness, light-headedness and headaches.  Psychiatric/Behavioral: Negative for agitation and dysphoric mood.       Objective:     Blood pressure sitting:  118/68, standing:  88-90/60  Physical Exam  Constitutional: She appears well-developed and well-nourished. No distress.  HENT:  Nose: Nose normal.  Mouth/Throat: Oropharynx is clear and moist.  Neck: Neck supple. No thyromegaly present.  Cardiovascular: Normal rate and regular rhythm.    Pulmonary/Chest: Breath sounds normal. No respiratory distress. She has no wheezes.  Abdominal: Soft. Bowel sounds are normal. There is no tenderness.  Musculoskeletal: She exhibits no tenderness.  Pedal and lower extremity swelling - improved.  No increased erythema.  Wounds healed.    Lymphadenopathy:    She has no cervical adenopathy.  Skin: No rash noted. No erythema.  Psychiatric: She has a normal mood and affect. Her behavior is normal.    BP 118/66 (BP Location: Left Arm, Patient Position: Sitting, Cuff Size: Normal)   Pulse 66   Temp (!) 97.5 F (36.4 C) (Oral)   Resp 18   Wt 115 lb 3.2 oz (52.3 kg)   SpO2 98%   BMI 22.50 kg/m  Wt Readings from Last 3 Encounters:  07/20/18 115 lb 3.2 oz (52.3 kg)  04/27/18 122 lb (55.3 kg)  04/22/18 122 lb (55.3 kg)     Lab Results  Component Value Date   WBC 6.8 07/20/2018   HGB 11.1 (L) 07/20/2018   HCT 33.0 (L) 07/20/2018   PLT 153.0 07/20/2018   GLUCOSE 104 (H) 07/20/2018   CHOL 229 (H) 07/12/2012   TRIG 87.0 07/12/2012   HDL 86.30 07/12/2012   LDLDIRECT 135.9 07/12/2012   ALT 11 07/20/2018   AST 20 07/20/2018   NA 141 07/20/2018   K 4.3 07/20/2018   CL 106 07/20/2018   CREATININE 1.01 07/20/2018   BUN 35 (H) 07/20/2018   CO2 25 07/20/2018   TSH 2.20 09/14/2017   INR 1.08 03/22/2018    Dg Ankle Complete Left  Result Date: 03/22/2018 CLINICAL DATA:  Patient fell today and presents with left ankle pain. EXAM: LEFT ANKLE COMPLETE - 3+ VIEW COMPARISON:  02/03/2017 FINDINGS: There is no evidence of fracture, dislocation, or joint effusion. There is no evidence of arthropathy or other focal bone abnormality. Mild soft tissue induration and swelling is noted of the included left leg and about  the malleoli. Minimal enthesopathy off the dorsum and plantar aspect of the calcaneus. Vascular phleboliths and calcifications are noted as before along the tibial and dorsalis pedis arteries in particular. IMPRESSION: Soft tissue  swelling of the left ankle without acute fracture or joint dislocation. Electronically Signed   By: Ashley Royalty M.D.   On: 03/22/2018 20:48   Dg Chest Portable 1 View  Result Date: 03/22/2018 CLINICAL DATA:  Acute onset of shortness of breath and hypoxemia. EXAM: PORTABLE CHEST 1 VIEW COMPARISON:  Chest radiograph performed 03/25/2017 FINDINGS: The lungs are mildly hypoexpanded. Mild left basilar airspace opacity may reflect atelectasis or mild pneumonia. There is no evidence of pleural effusion or pneumothorax. The cardiomediastinal silhouette is within normal limits. No acute osseous abnormalities are seen. The patient is status post vertebroplasty at multiple levels along the thoracic spine. IMPRESSION: Lungs mildly hypoexpanded. Mild left basilar airspace opacity may reflect atelectasis or mild pneumonia. Electronically Signed   By: Garald Balding M.D.   On: 03/22/2018 22:14   Dg Hip Unilat W Or Wo Pelvis 2-3 Views Right  Result Date: 03/22/2018 CLINICAL DATA:  Patient fell today complaining of right hip and ankle pain. EXAM: DG HIP (WITH OR WITHOUT PELVIS) 2-3V RIGHT COMPARISON:  08/05/2012 CT FINDINGS: Acute bilateral inferior pubic and right superior pubic rami fractures are noted with a suspicious lucency also involving the left superior pubic ramus that may represent a nondisplaced pubic ramus fracture is well. Slight caudal displacement of the right parasymphysis secondary to fracture is noted. Vertebral augmentation cement is identified within the L4 and L5 vertebral bodies. Adjacent right IVC filter is in place. No proximal femoral fracture is noted with left femoral nail fixation identified. No joint dislocation of either hip. IMPRESSION: 1. Acute bilateral inferior pubic and right superior pubic rami fractures are noted with equivocal left superior pubic ramus fracture as well. 2. Vertebral augmentation L4 and L5. 3. No hip fracture. Electronically Signed   By: Ashley Royalty M.D.   On: 03/22/2018  20:45       Assessment & Plan:   Problem List Items Addressed This Visit    Abnormal CXR    Discussed f/u cxr and further w/up.  She declines.  Agreed to labs.        Anemia - Primary    Recheck cbc.  Will also check ferritin.  Has been on iron.  Follow.        Relevant Orders   CBC with Differential/Platelet (Completed)   Ferritin (Completed)   Bilateral lower extremity edema    Has been on 1/2 lasix daily.  Given drop in blood pressure with standing, will hold lasix.  Swelling is better.  Continue leg elevation.        GERD (gastroesophageal reflux disease)    No acid reflux symptoms reported.  Follow.        Hypercholesterolemia    Follow lipid panel.        Relevant Medications   ramipril (ALTACE) 2.5 MG capsule   Hypertension    Orthostatic on exam.  Limiting her ability to get up and move around.  Will hold lasix.  Decrease ramipril to 2.5mg  q day.  Follow pressures.  Continue to adjust as needed.        Relevant Medications   ramipril (ALTACE) 2.5 MG capsule   Other Relevant Orders   Hepatic function panel (Completed)   Basic metabolic panel (Completed)   Hypoxia   Osteoporosis    Has declined testing and  medication.  Follow.       Renal insufficiency    Recheck metabolic panel today.           I spent over 40 minutes with the patient and more than 50% of the time was spent in consultation regarding the above.  Time spent discussing her current symptoms and concerns.  Time also spent discussing treatment options and further evaluation.    [name], MD

## 2018-07-21 ENCOUNTER — Encounter: Payer: Self-pay | Admitting: Internal Medicine

## 2018-07-21 DIAGNOSIS — R9389 Abnormal findings on diagnostic imaging of other specified body structures: Secondary | ICD-10-CM | POA: Insufficient documentation

## 2018-07-21 DIAGNOSIS — R0902 Hypoxemia: Secondary | ICD-10-CM | POA: Insufficient documentation

## 2018-07-21 NOTE — Assessment & Plan Note (Signed)
Discussed f/u cxr and further w/up.  She declines.  Agreed to labs.

## 2018-07-21 NOTE — Assessment & Plan Note (Signed)
Recheck cbc.  Will also check ferritin.  Has been on iron.  Follow.

## 2018-07-21 NOTE — Assessment & Plan Note (Signed)
Follow lipid panel.   

## 2018-07-21 NOTE — Assessment & Plan Note (Signed)
Recheck metabolic panel today.

## 2018-07-21 NOTE — Assessment & Plan Note (Signed)
Has declined testing and medication.  Follow.

## 2018-07-21 NOTE — Assessment & Plan Note (Signed)
Has been on 1/2 lasix daily.  Given drop in blood pressure with standing, will hold lasix.  Swelling is better.  Continue leg elevation.

## 2018-07-21 NOTE — Assessment & Plan Note (Signed)
No acid reflux symptoms reported.  Follow.   

## 2018-07-21 NOTE — Assessment & Plan Note (Signed)
Orthostatic on exam.  Limiting her ability to get up and move around.  Will hold lasix.  Decrease ramipril to 2.5mg  q day.  Follow pressures.  Continue to adjust as needed.

## 2018-07-22 NOTE — Addendum Note (Signed)
Addended by: Lars Masson on: 07/22/2018 01:45 PM   Modules accepted: Orders

## 2018-07-23 DIAGNOSIS — R0902 Hypoxemia: Secondary | ICD-10-CM | POA: Diagnosis not present

## 2018-07-23 DIAGNOSIS — I10 Essential (primary) hypertension: Secondary | ICD-10-CM | POA: Diagnosis not present

## 2018-07-23 DIAGNOSIS — R9389 Abnormal findings on diagnostic imaging of other specified body structures: Secondary | ICD-10-CM | POA: Diagnosis not present

## 2018-08-23 DIAGNOSIS — I10 Essential (primary) hypertension: Secondary | ICD-10-CM | POA: Diagnosis not present

## 2018-08-23 DIAGNOSIS — R9389 Abnormal findings on diagnostic imaging of other specified body structures: Secondary | ICD-10-CM | POA: Diagnosis not present

## 2018-08-23 DIAGNOSIS — R0902 Hypoxemia: Secondary | ICD-10-CM | POA: Diagnosis not present

## 2018-08-27 ENCOUNTER — Telehealth: Payer: Self-pay | Admitting: *Deleted

## 2018-08-27 NOTE — Telephone Encounter (Signed)
Copied from [place] [phone]. Topic: General - Other >> Aug 27, 2018  9:31 AM Leward Quan A wrote: Reason for CRM: Dr Marcie Mowers patients nephew called to speak to Dr Nicki Reaper or her nurse. This is in reference to getting patient into [facility] care facility LTC in the near future. He would like a call back to discuss these possibilities since care taker will be leaving soon.  Ph# [phone]

## 2018-08-31 NOTE — Telephone Encounter (Signed)
Spoke with Mr Gerlean Ren this morning to see what they are wanting to do as a next step. Patients care giver is leaving the country as of 1/31 and will not return. Patients nephew was wanting some advice on what they should do next since patient can not stay at home alone. Explained that we would have to fill out an FL2 after they have decided which facility patient will be placed. They are wanting to place her at [facility]. Advised that he should call and set up a time to meet with the social worker or admissions coordinator to discuss cost, insurance, and other financial questions. He was wondering if there was any type of patient assistance for patients being placed in home to help with cost. Advised that I did not know for sure but would be glad to ask around. Mr. Gerlean Ren is supposed to call back later in the week with an update after he talks to [facility].

## 2018-08-31 NOTE — Telephone Encounter (Signed)
Ok.  Let me know if I need to do anything.   

## 2018-09-10 ENCOUNTER — Other Ambulatory Visit: Payer: Self-pay | Admitting: Internal Medicine

## 2018-09-10 NOTE — Telephone Encounter (Signed)
Patients nephew has found another care giver to take over and is going to hold on placing in facility at this time.

## 2018-09-15 ENCOUNTER — Other Ambulatory Visit: Payer: Self-pay | Admitting: Internal Medicine

## 2018-09-21 ENCOUNTER — Telehealth: Payer: Self-pay | Admitting: *Deleted

## 2018-09-21 NOTE — Telephone Encounter (Signed)
Copied from [place] [id number]. Topic: General - Other >> Sep 21, 2018  8:32 AM Alanda Slim E wrote: Reason for CRM: Power of attorney, Marcie Mowers CB# [phone] Needs Gilmore Laroche to give him a call before the Pt's appt tomorrow about next steps with care. He is in a meeting from 10am-11am and then free the rest of the day. Derrek Monaco advise

## 2018-09-22 ENCOUNTER — Ambulatory Visit (INDEPENDENT_AMBULATORY_CARE_PROVIDER_SITE_OTHER): Payer: Medicare Other | Admitting: Internal Medicine

## 2018-09-22 VITALS — BP 128/72 | HR 73 | Temp 98.1°F | Resp 16 | Wt 114.8 lb

## 2018-09-22 DIAGNOSIS — N289 Disorder of kidney and ureter, unspecified: Secondary | ICD-10-CM

## 2018-09-22 DIAGNOSIS — R0902 Hypoxemia: Secondary | ICD-10-CM

## 2018-09-22 DIAGNOSIS — Z0289 Encounter for other administrative examinations: Secondary | ICD-10-CM

## 2018-09-22 DIAGNOSIS — D649 Anemia, unspecified: Secondary | ICD-10-CM | POA: Diagnosis not present

## 2018-09-22 DIAGNOSIS — I10 Essential (primary) hypertension: Secondary | ICD-10-CM

## 2018-09-22 DIAGNOSIS — Z23 Encounter for immunization: Secondary | ICD-10-CM

## 2018-09-22 DIAGNOSIS — E7800 Pure hypercholesterolemia, unspecified: Secondary | ICD-10-CM

## 2018-09-22 DIAGNOSIS — R600 Localized edema: Secondary | ICD-10-CM

## 2018-09-22 NOTE — Telephone Encounter (Signed)
Pts POA calling to let me know that they have decided to move forward with placement at [facility]. I have started FL2

## 2018-09-22 NOTE — Progress Notes (Signed)
Patient ID: Kayla Galloway, female   DOB: Aug 27, 1920, 85 y.o.   MRN: [id number]   Subjective:    Patient ID: Kayla Galloway, female    DOB: 1921/07/22, 86 y.o.   MRN: [id number]  HPI  Patient here for a scheduled follow up.  She is accompanied by her sister.  History obtained from both of them.  Doing better.  Feels better.  Eating.  Good appetite.  No nausea or vomiting.  No chest pain.  Breathing stable.  No acid reflux.  No abdominal pain.  Bowels moving.  Taking lasix prn.  Discussed assisted living.  She has caretakers now (around the clock).  Sister reports getting costly.  Need to look at assisted living options.  Wants FL2 form completed.     Past Medical History:  Diagnosis Date  . Anemia   . Arthritis   . Cancer ([place])    skin ca lesion of scalp- removed  . Depression   . DVT (deep venous thrombosis), left    bilateral, IVC filter 2010  . GERD (gastroesophageal reflux disease)   . Gout   . Hypercholesterolemia   . Hyperkalemia   . Hypertension    Dr. [name]  . Nephrolithiasis   . Obesity   . Osteoporosis   . Peripheral vascular disease ([place])   . 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 Spider veins    Past Surgical History:  Procedure Laterality Date  . CARDIAC CATHETERIZATION     2010 Does not see a cardiac  doctor  . Colonsopy    . DENTAL SURGERY    . ECTOPIC PREGNANCY SURGERY    . EYE SURGERY Bilateral 2011,2012   cataracts  . FRACTURE SURGERY  2009   hip, rod from knee to hip  . HEMORRHOID SURGERY    . IVC filter Left    pt states it has turned side ways and could not be removed.  Marland Kitchen KYPHOPLASTY  09/03/2012   per patient, she has had kypho x 2:  Surgeon: Winfield Cunas, MD;  Location: [facility];  Service: Neurosurgery;  Laterality: N/A;  Thoracic eight Kyphoplasty  . OPEN REDUCTION INTERNAL FIXATION (ORIF) DISTAL RADIAL FRACTURE Left 09/06/2015    Procedure: OPEN REDUCTION INTERNAL FIXATION (ORIF) DISTAL RADIAL FRACTURE;  Surgeon: Hessie Knows, MD;  Location: [facility] ORS;  Service: Orthopedics;  Laterality: Left;  . RIGHT OOPHORECTOMY     partial-  . SKIN CANCER EXCISION     top of head  . TONSILLECTOMY     age 74  . VERTEBROPLASTY  12/31/2011   Procedure: VERTEBROPLASTY;  Surgeon: Winfield Cunas, MD;  Location: [facility];  Service: Neurosurgery;  Laterality: N/A;  Thoracic eleven vertebroplasty   Family History  Problem Relation Age of Onset  . Heart disease Father        myocardial infarction  . Heart disease Brother        myocardial infarction  . Lymphoma Sister   . Anesthesia problems Neg Hx   . Breast cancer Neg Hx   . Colon cancer Neg Hx    Social History   Socioeconomic History  . Marital status: Widowed    Spouse name: Not on file  . Number of children: 0  . Years of education: Not on file  . Highest education level: Not on file  Occupational History  . Not on  file  Social Needs  . Financial resource strain: Not on file  . Food insecurity:    Worry: Not on file    Inability: Not on file  . Transportation needs:    Medical: Not on file    Non-medical: Not on file  Tobacco Use  . Smoking status: Never Smoker  . Smokeless tobacco: Never Used  Substance and Sexual Activity  . Alcohol use: No    Alcohol/week: 0.0 standard drinks  . Drug use: No  . Sexual activity: Never  Lifestyle  . Physical activity:    Days per week: Not on file    Minutes per session: Not on file  . Stress: Not on file  Relationships  . Social connections:    Talks on phone: Not on file    Gets together: Not on file    Attends religious service: Not on file    Active member of club or organization: Not on file    Attends meetings of clubs or organizations: Not on file    Relationship status: Not on file  Other Topics Concern  . Not on file  Social History Narrative  . Not on file    Outpatient Encounter Medications as  of 09/22/2018  Medication Sig  . acetaminophen (TYLENOL) 650 MG CR tablet Take 650 mg by mouth every 8 (eight) hours as needed for pain.   . Calcium 600-200 MG-UNIT tablet Take 1 tablet by mouth 3 (three) times daily with meals.  . docusate sodium (COLACE) 100 MG capsule Take 2 capsules (200 mg total) by mouth 2 (two) times daily.  . ferrous sulfate 325 (65 FE) MG tablet Take 325 mg by mouth every evening.  . furosemide (LASIX) 20 MG tablet TAKE 1 TABLET BY MOUTH ONCE DAILY  . Glucosamine-Chondroitin 250-200 MG TABS Take 1 tablet by mouth 2 (two) times daily.   . Loratadine 10 MG CAPS Take by mouth.  . Melatonin 5 MG TABS Take by mouth.  . Multiple Vitamins-Minerals (CENTRUM SILVER ADULT 50+ PO) Take by mouth.  Marland Kitchen omeprazole (PRILOSEC) 20 MG capsule TAKE 1 CAPSULE BY MOUTH ONCE DAILY  . polyethylene glycol (MIRALAX / GLYCOLAX) packet Take 17 g by mouth daily as needed for mild constipation.  . ramipril (ALTACE) 2.5 MG capsule TAKE 1 CAPSULE BY MOUTH ONCE DAILY  . sertraline (ZOLOFT) 50 MG tablet TAKE 1 TABLET BY MOUTH ONCE DAILY   No facility-administered encounter medications on file as of 09/22/2018.     Review of Systems  Constitutional: Negative for appetite change and unexpected weight change.  HENT: Negative for congestion and sinus pressure.   Respiratory: Negative for cough and chest tightness.        Breathing stable.   Cardiovascular: Negative for chest pain and palpitations.       Leg swelling better.    Gastrointestinal: Negative for abdominal pain, diarrhea, nausea and vomiting.  Genitourinary: Negative for difficulty urinating and dysuria.  Musculoskeletal: Negative for joint swelling and myalgias.  Skin: Negative for color change and rash.  Neurological: Negative for dizziness, light-headedness and headaches.  Psychiatric/Behavioral: Negative for agitation and dysphoric mood.       Objective:    Physical Exam Constitutional:      General: She is not in acute  distress.    Appearance: Normal appearance.  HENT:     Nose: Nose normal. No congestion.     Mouth/Throat:     Pharynx: No oropharyngeal exudate or posterior oropharyngeal erythema.  Neck:  Musculoskeletal: Neck supple. No muscular tenderness.     Thyroid: No thyromegaly.  Cardiovascular:     Rate and Rhythm: Normal rate and regular rhythm.  Pulmonary:     Effort: No respiratory distress.     Breath sounds: Normal breath sounds. No wheezing.  Abdominal:     General: Bowel sounds are normal.     Palpations: Abdomen is soft.     Tenderness: There is no abdominal tenderness.  Musculoskeletal:        General: No swelling or tenderness.  Lymphadenopathy:     Cervical: No cervical adenopathy.  Skin:    Findings: No erythema or rash.  Neurological:     Mental Status: She is alert.  Psychiatric:        Mood and Affect: Mood normal.        Behavior: Behavior normal.     BP 128/72 (BP Location: Left Arm, Patient Position: Sitting, Cuff Size: Normal)   Pulse 73   Temp 98.1 F (36.7 C) (Oral)   Resp 16   Wt 114 lb 12.8 oz (52.1 kg)   SpO2 94%   BMI 22.42 kg/m  Wt Readings from Last 3 Encounters:  09/22/18 114 lb 12.8 oz (52.1 kg)  07/20/18 115 lb 3.2 oz (52.3 kg)  04/27/18 122 lb (55.3 kg)     Lab Results  Component Value Date   WBC 6.8 07/20/2018   HGB 11.1 (L) 07/20/2018   HCT 33.0 (L) 07/20/2018   PLT 153.0 07/20/2018   GLUCOSE 104 (H) 07/20/2018   CHOL 229 (H) 07/12/2012   TRIG 87.0 07/12/2012   HDL 86.30 07/12/2012   LDLDIRECT 135.9 07/12/2012   ALT 11 07/20/2018   AST 20 07/20/2018   NA 141 07/20/2018   K 4.3 07/20/2018   CL 106 07/20/2018   CREATININE 1.01 07/20/2018   BUN 35 (H) 07/20/2018   CO2 25 07/20/2018   TSH 2.20 09/14/2017   INR 1.08 03/22/2018    Dg Ankle Complete Left  Result Date: 03/22/2018 CLINICAL DATA:  Patient fell today and presents with left ankle pain. EXAM: LEFT ANKLE COMPLETE - 3+ VIEW COMPARISON:  02/03/2017 FINDINGS: There  is no evidence of fracture, dislocation, or joint effusion. There is no evidence of arthropathy or other focal bone abnormality. Mild soft tissue induration and swelling is noted of the included left leg and about the malleoli. Minimal enthesopathy off the dorsum and plantar aspect of the calcaneus. Vascular phleboliths and calcifications are noted as before along the tibial and dorsalis pedis arteries in particular. IMPRESSION: Soft tissue swelling of the left ankle without acute fracture or joint dislocation. Electronically Signed   By: Ashley Royalty M.D.   On: 03/22/2018 20:48   Dg Chest Portable 1 View  Result Date: 03/22/2018 CLINICAL DATA:  Acute onset of shortness of breath and hypoxemia. EXAM: PORTABLE CHEST 1 VIEW COMPARISON:  Chest radiograph performed 03/25/2017 FINDINGS: The lungs are mildly hypoexpanded. Mild left basilar airspace opacity may reflect atelectasis or mild pneumonia. There is no evidence of pleural effusion or pneumothorax. The cardiomediastinal silhouette is within normal limits. No acute osseous abnormalities are seen. The patient is status post vertebroplasty at multiple levels along the thoracic spine. IMPRESSION: Lungs mildly hypoexpanded. Mild left basilar airspace opacity may reflect atelectasis or mild pneumonia. Electronically Signed   By: Garald Balding M.D.   On: 03/22/2018 22:14   Dg Hip Unilat W Or Wo Pelvis 2-3 Views Right  Result Date: 03/22/2018 CLINICAL DATA:  Patient fell today  complaining of right hip and ankle pain. EXAM: DG HIP (WITH OR WITHOUT PELVIS) 2-3V RIGHT COMPARISON:  08/05/2012 CT FINDINGS: Acute bilateral inferior pubic and right superior pubic rami fractures are noted with a suspicious lucency also involving the left superior pubic ramus that may represent a nondisplaced pubic ramus fracture is well. Slight caudal displacement of the right parasymphysis secondary to fracture is noted. Vertebral augmentation cement is identified within the L4 and L5  vertebral bodies. Adjacent right IVC filter is in place. No proximal femoral fracture is noted with left femoral nail fixation identified. No joint dislocation of either hip. IMPRESSION: 1. Acute bilateral inferior pubic and right superior pubic rami fractures are noted with equivocal left superior pubic ramus fracture as well. 2. Vertebral augmentation L4 and L5. 3. No hip fracture. Electronically Signed   By: Ashley Royalty M.D.   On: 03/22/2018 20:45       Assessment & Plan:   Problem List Items Addressed This Visit    Anemia    Follow cbc.  Taking iron supplements.        Bilateral lower extremity edema    Swelling improved.  Follow.        Encounter for completion of form with patient    Discussed assisted living.  Form completed.        Hypercholesterolemia    Follow lipid panel.        Hypertension    Blood pressure under good control.  Continue same medication regimen.  Follow pressures.  Follow metabolic panel.        Hypoxia    Wearing her oxygen now.  Breathing stable.        Renal insufficiency    Stay hydrated.  Follow metabolic panel.         Other Visit Diagnoses    Need for vaccination with 13-polyvalent pneumococcal conjugate vaccine    -  Primary   Relevant Orders   Pneumococcal conjugate vaccine 13-valent (Completed)       [name], MD

## 2018-09-22 NOTE — Telephone Encounter (Signed)
Noted  

## 2018-09-23 ENCOUNTER — Other Ambulatory Visit: Payer: Self-pay

## 2018-09-23 DIAGNOSIS — R9389 Abnormal findings on diagnostic imaging of other specified body structures: Secondary | ICD-10-CM | POA: Diagnosis not present

## 2018-09-23 DIAGNOSIS — R0902 Hypoxemia: Secondary | ICD-10-CM | POA: Diagnosis not present

## 2018-09-23 DIAGNOSIS — I10 Essential (primary) hypertension: Secondary | ICD-10-CM | POA: Diagnosis not present

## 2018-09-26 ENCOUNTER — Encounter: Payer: Self-pay | Admitting: Internal Medicine

## 2018-09-26 DIAGNOSIS — Z0289 Encounter for other administrative examinations: Secondary | ICD-10-CM | POA: Insufficient documentation

## 2018-09-26 NOTE — Assessment & Plan Note (Signed)
Follow cbc.  Taking iron supplements.

## 2018-09-26 NOTE — Assessment & Plan Note (Signed)
Blood pressure under good control.  Continue same medication regimen.  Follow pressures.  Follow metabolic panel.   

## 2018-09-26 NOTE — Assessment & Plan Note (Signed)
Swelling improved.  Follow.   

## 2018-09-26 NOTE — Assessment & Plan Note (Signed)
Stay hydrated.  Follow metabolic panel.  

## 2018-09-26 NOTE — Assessment & Plan Note (Signed)
Discussed assisted living.  Form completed.

## 2018-09-26 NOTE — Assessment & Plan Note (Signed)
Wearing her oxygen now.  Breathing stable.

## 2018-09-26 NOTE — Assessment & Plan Note (Signed)
Follow lipid panel.   

## 2018-10-12 NOTE — Telephone Encounter (Signed)
Spoke with Kayla Galloway and got direct number to Admissions at [facility]. Advised that FL2 has been sent and I will give her a call this afternoon

## 2018-10-12 NOTE — Telephone Encounter (Signed)
Pt's POA calling back.  States that FL2 has not been received by [facility] at this time.  Pt would like to speak with Gilmore Laroche. Elta Guadeloupe can be reached at [phone]

## 2018-10-12 NOTE — Telephone Encounter (Signed)
[facility] # [phone]

## 2018-10-12 NOTE — Telephone Encounter (Signed)
Left message for Kayla Galloway at [facility]

## 2018-10-13 NOTE — Telephone Encounter (Signed)
FL2 faxed and emailed to [facility]. Magda Paganini with Admissions is aware and will call if not received

## 2018-10-15 ENCOUNTER — Other Ambulatory Visit: Payer: Self-pay | Admitting: Internal Medicine

## 2018-10-22 DIAGNOSIS — I10 Essential (primary) hypertension: Secondary | ICD-10-CM | POA: Diagnosis not present

## 2018-10-22 DIAGNOSIS — R0902 Hypoxemia: Secondary | ICD-10-CM | POA: Diagnosis not present

## 2018-10-22 DIAGNOSIS — R9389 Abnormal findings on diagnostic imaging of other specified body structures: Secondary | ICD-10-CM | POA: Diagnosis not present

## 2018-10-27 ENCOUNTER — Encounter (INDEPENDENT_AMBULATORY_CARE_PROVIDER_SITE_OTHER): Payer: Medicare Other

## 2018-10-27 ENCOUNTER — Ambulatory Visit (INDEPENDENT_AMBULATORY_CARE_PROVIDER_SITE_OTHER): Payer: Medicare Other | Admitting: Nurse Practitioner

## 2018-11-04 ENCOUNTER — Telehealth: Payer: Self-pay

## 2018-11-04 NOTE — Telephone Encounter (Signed)
Copied from [place] [id number]. Topic: General - Other >> Nov 04, 2018 10:29 AM Ivar Drape wrote: Reason for CRM:  Patient's POA, Marcie Mowers,  would like a call back from [place] concerning patient's FL2 form. >> Nov 04, 2018 10:33 AM Carolyn Stare wrote:   Correct phone number for call back [phone]

## 2018-11-04 NOTE — Telephone Encounter (Signed)
Pt will be getting admitted to [facility] on Tuesday but they need an updated FL2 because the one they have is older than 30 days

## 2018-11-05 NOTE — Telephone Encounter (Signed)
Left message for Kayla Galloway to call back.

## 2018-11-11 NOTE — Telephone Encounter (Signed)
Kayla Galloway states he prefers to pick this up instead af faxing if that is ok. Please call asap.

## 2018-11-11 NOTE — Telephone Encounter (Signed)
Called Kayla Galloway to let him know that I can fill the FL2 out and get it ready for them. Also advised that I have not been able to reach [name]

## 2018-11-12 NOTE — Telephone Encounter (Signed)
FL2 has been picked up by POA, [employer]. I have kept copy for her chart

## 2018-11-16 ENCOUNTER — Telehealth: Payer: Self-pay

## 2018-11-16 NOTE — Telephone Encounter (Signed)
Copied from [place] [id number]. Topic: General - Inquiry >> Nov 16, 2018  2:53 PM Scherrie Gerlach wrote: Reason for CRM: pt finally got moved into [facility] long term care unit .  Huey Romans needs order from the dr to d/c oxygen in the home. Can you fax d/c order [phone]

## 2018-11-17 ENCOUNTER — Other Ambulatory Visit: Payer: Self-pay

## 2018-11-17 DIAGNOSIS — J9611 Chronic respiratory failure with hypoxia: Secondary | ICD-10-CM | POA: Diagnosis not present

## 2018-11-17 DIAGNOSIS — J439 Emphysema, unspecified: Secondary | ICD-10-CM | POA: Diagnosis not present

## 2018-11-17 DIAGNOSIS — E441 Mild protein-calorie malnutrition: Secondary | ICD-10-CM | POA: Diagnosis not present

## 2018-11-17 NOTE — Telephone Encounter (Signed)
Signed and placed on your desk.

## 2018-11-17 NOTE — Patient Outreach (Signed)
[facility]) Care Management  11/17/2018  Kayla Galloway 10/22/1920 [id number]   Medication Adherence call to Kayla Galloway [facility] [employer] [facility] spoke with patient she is at a nursing home at this time and they are providing with all of her medication she did said she will be going home on Monday and patient will start receiving at home [name] is showing past due under [place].   [place] Management Direct Dial [phone]  Fax [phone] [email or web address]

## 2018-11-17 NOTE — Telephone Encounter (Signed)
Order written to d/c home oxygen. Pt is now at [facility]. Placed out for you to sign

## 2018-11-17 NOTE — Telephone Encounter (Signed)
Faxed to [facility]

## 2018-12-02 DIAGNOSIS — I10 Essential (primary) hypertension: Secondary | ICD-10-CM | POA: Diagnosis not present

## 2018-12-02 DIAGNOSIS — J302 Other seasonal allergic rhinitis: Secondary | ICD-10-CM | POA: Diagnosis not present

## 2018-12-02 DIAGNOSIS — G8929 Other chronic pain: Secondary | ICD-10-CM | POA: Diagnosis not present

## 2018-12-18 DIAGNOSIS — N390 Urinary tract infection, site not specified: Secondary | ICD-10-CM | POA: Diagnosis not present

## 2018-12-18 DIAGNOSIS — R319 Hematuria, unspecified: Secondary | ICD-10-CM | POA: Diagnosis not present

## 2019-02-23 ENCOUNTER — Other Ambulatory Visit: Payer: Self-pay

## 2019-02-23 NOTE — Patient Outreach (Signed)
[facility]) Care Management  02/23/2019  ENAS WINCHEL [birth year] [id number]     Referral received from [place] team for complex case management.  Unsuccessful outreach attempt. Phone rang multiple times without option to leave a voice message.   PLAN -Will send unsuccessful outreach letter. -Will attempt outreach within 3-4 business days.  [place] [phone]

## 2019-03-01 ENCOUNTER — Other Ambulatory Visit: Payer: Self-pay

## 2019-03-01 DIAGNOSIS — Z0000 Encounter for general adult medical examination without abnormal findings: Secondary | ICD-10-CM | POA: Diagnosis not present

## 2019-03-01 DIAGNOSIS — J439 Emphysema, unspecified: Secondary | ICD-10-CM | POA: Diagnosis not present

## 2019-03-01 DIAGNOSIS — E441 Mild protein-calorie malnutrition: Secondary | ICD-10-CM | POA: Diagnosis not present

## 2019-03-01 NOTE — Patient Outreach (Signed)
[name]) Care Management  03/01/2019  Kayla Galloway [birth year] [id number]    Unsuccessful outreach attempt. Phone rang without option to leave a voice message.  PLAN -Will follow-up within 3-4 business days.  [place] [phone]

## 2019-03-07 ENCOUNTER — Other Ambulatory Visit: Payer: Self-pay

## 2019-03-07 NOTE — Patient Outreach (Signed)
[facility]) Care Management  03/07/2019  CIMONE FAHEY [birth year] [id number]    3rd Unsuccessful outreach attempt. No response to unsuccessful outreach letter.   PLAN -Case closure pending.  [place] [phone]

## 2019-03-18 DIAGNOSIS — M542 Cervicalgia: Secondary | ICD-10-CM | POA: Diagnosis not present

## 2019-03-18 DIAGNOSIS — M545 Low back pain: Secondary | ICD-10-CM | POA: Diagnosis not present

## 2019-03-18 DIAGNOSIS — M546 Pain in thoracic spine: Secondary | ICD-10-CM | POA: Diagnosis not present

## 2019-03-20 DIAGNOSIS — M546 Pain in thoracic spine: Secondary | ICD-10-CM | POA: Diagnosis not present

## 2019-03-20 DIAGNOSIS — M545 Low back pain: Secondary | ICD-10-CM | POA: Diagnosis not present

## 2019-03-21 DIAGNOSIS — D500 Iron deficiency anemia secondary to blood loss (chronic): Secondary | ICD-10-CM | POA: Diagnosis not present

## 2019-03-21 DIAGNOSIS — I10 Essential (primary) hypertension: Secondary | ICD-10-CM | POA: Diagnosis not present

## 2019-03-21 DIAGNOSIS — D649 Anemia, unspecified: Secondary | ICD-10-CM | POA: Diagnosis not present

## 2019-03-22 DIAGNOSIS — L03115 Cellulitis of right lower limb: Secondary | ICD-10-CM | POA: Diagnosis not present

## 2019-03-22 DIAGNOSIS — J302 Other seasonal allergic rhinitis: Secondary | ICD-10-CM | POA: Diagnosis not present

## 2019-03-22 DIAGNOSIS — E7800 Pure hypercholesterolemia, unspecified: Secondary | ICD-10-CM | POA: Diagnosis not present

## 2019-03-22 DIAGNOSIS — M810 Age-related osteoporosis without current pathological fracture: Secondary | ICD-10-CM | POA: Diagnosis not present

## 2019-03-22 DIAGNOSIS — K219 Gastro-esophageal reflux disease without esophagitis: Secondary | ICD-10-CM | POA: Diagnosis not present

## 2019-03-22 DIAGNOSIS — M5135 Other intervertebral disc degeneration, thoracolumbar region: Secondary | ICD-10-CM | POA: Diagnosis not present

## 2019-03-22 DIAGNOSIS — M109 Gout, unspecified: Secondary | ICD-10-CM | POA: Diagnosis not present

## 2019-03-22 DIAGNOSIS — G4700 Insomnia, unspecified: Secondary | ICD-10-CM | POA: Diagnosis not present

## 2019-03-22 DIAGNOSIS — R0902 Hypoxemia: Secondary | ICD-10-CM | POA: Diagnosis not present

## 2019-03-22 DIAGNOSIS — I25119 Atherosclerotic heart disease of native coronary artery with unspecified angina pectoris: Secondary | ICD-10-CM | POA: Diagnosis not present

## 2019-03-22 DIAGNOSIS — Z85828 Personal history of other malignant neoplasm of skin: Secondary | ICD-10-CM | POA: Diagnosis not present

## 2019-03-22 DIAGNOSIS — E441 Mild protein-calorie malnutrition: Secondary | ICD-10-CM | POA: Diagnosis not present

## 2019-03-22 DIAGNOSIS — I10 Essential (primary) hypertension: Secondary | ICD-10-CM | POA: Diagnosis not present

## 2019-03-22 DIAGNOSIS — J9611 Chronic respiratory failure with hypoxia: Secondary | ICD-10-CM | POA: Diagnosis not present

## 2019-03-22 DIAGNOSIS — N289 Disorder of kidney and ureter, unspecified: Secondary | ICD-10-CM | POA: Diagnosis not present

## 2019-03-22 DIAGNOSIS — J9610 Chronic respiratory failure, unspecified whether with hypoxia or hypercapnia: Secondary | ICD-10-CM | POA: Diagnosis not present

## 2019-03-22 DIAGNOSIS — I200 Unstable angina: Secondary | ICD-10-CM | POA: Diagnosis not present

## 2019-03-22 DIAGNOSIS — D649 Anemia, unspecified: Secondary | ICD-10-CM | POA: Diagnosis not present

## 2019-03-22 DIAGNOSIS — M1990 Unspecified osteoarthritis, unspecified site: Secondary | ICD-10-CM | POA: Diagnosis not present

## 2019-03-22 DIAGNOSIS — M40204 Unspecified kyphosis, thoracic region: Secondary | ICD-10-CM | POA: Diagnosis not present

## 2019-03-22 DIAGNOSIS — M8588 Other specified disorders of bone density and structure, other site: Secondary | ICD-10-CM | POA: Diagnosis not present

## 2019-03-22 DIAGNOSIS — E871 Hypo-osmolality and hyponatremia: Secondary | ICD-10-CM | POA: Diagnosis not present

## 2019-03-22 DIAGNOSIS — J439 Emphysema, unspecified: Secondary | ICD-10-CM | POA: Diagnosis not present

## 2019-03-24 DIAGNOSIS — E441 Mild protein-calorie malnutrition: Secondary | ICD-10-CM | POA: Diagnosis not present

## 2019-03-24 DIAGNOSIS — J9611 Chronic respiratory failure with hypoxia: Secondary | ICD-10-CM | POA: Diagnosis not present

## 2019-03-24 DIAGNOSIS — J439 Emphysema, unspecified: Secondary | ICD-10-CM | POA: Diagnosis not present

## 2019-04-12 DIAGNOSIS — M6281 Muscle weakness (generalized): Secondary | ICD-10-CM | POA: Diagnosis not present

## 2019-04-12 DIAGNOSIS — R2689 Other abnormalities of gait and mobility: Secondary | ICD-10-CM | POA: Diagnosis not present

## 2019-05-30 DIAGNOSIS — J9611 Chronic respiratory failure with hypoxia: Secondary | ICD-10-CM | POA: Diagnosis not present

## 2019-05-30 DIAGNOSIS — E441 Mild protein-calorie malnutrition: Secondary | ICD-10-CM | POA: Diagnosis not present

## 2019-05-30 DIAGNOSIS — J439 Emphysema, unspecified: Secondary | ICD-10-CM | POA: Diagnosis not present

## 2019-05-31 DIAGNOSIS — M6281 Muscle weakness (generalized): Secondary | ICD-10-CM | POA: Diagnosis not present

## 2019-05-31 DIAGNOSIS — R2689 Other abnormalities of gait and mobility: Secondary | ICD-10-CM | POA: Diagnosis not present

## 2019-06-20 ENCOUNTER — Emergency Department: Payer: Medicare Other

## 2019-06-20 ENCOUNTER — Inpatient Hospital Stay
Admission: EM | Admit: 2019-06-20 | Discharge: 2019-06-22 | DRG: 641 | Disposition: A | Discharge status: Skilled Nursing Facility | Payer: Medicare Other | Attending: Internal Medicine | Admitting: Internal Medicine

## 2019-06-20 ENCOUNTER — Other Ambulatory Visit: Payer: Self-pay

## 2019-06-20 ENCOUNTER — Encounter: Payer: Self-pay | Admitting: Emergency Medicine

## 2019-06-20 DIAGNOSIS — Z03818 Encounter for observation for suspected exposure to other biological agents ruled out: Secondary | ICD-10-CM | POA: Diagnosis not present

## 2019-06-20 DIAGNOSIS — S52531A Colles' fracture of right radius, initial encounter for closed fracture: Secondary | ICD-10-CM | POA: Diagnosis not present

## 2019-06-20 DIAGNOSIS — R4020 Unspecified coma: Secondary | ICD-10-CM

## 2019-06-20 DIAGNOSIS — I340 Nonrheumatic mitral (valve) insufficiency: Secondary | ICD-10-CM | POA: Diagnosis not present

## 2019-06-20 DIAGNOSIS — I509 Heart failure, unspecified: Secondary | ICD-10-CM

## 2019-06-20 DIAGNOSIS — E860 Dehydration: Secondary | ICD-10-CM | POA: Diagnosis not present

## 2019-06-20 DIAGNOSIS — N179 Acute kidney failure, unspecified: Secondary | ICD-10-CM | POA: Diagnosis present

## 2019-06-20 DIAGNOSIS — E7800 Pure hypercholesterolemia, unspecified: Secondary | ICD-10-CM | POA: Diagnosis present

## 2019-06-20 DIAGNOSIS — S52501A Unspecified fracture of the lower end of right radius, initial encounter for closed fracture: Secondary | ICD-10-CM | POA: Diagnosis present

## 2019-06-20 DIAGNOSIS — Z8249 Family history of ischemic heart disease and other diseases of the circulatory system: Secondary | ICD-10-CM

## 2019-06-20 DIAGNOSIS — R0902 Hypoxemia: Secondary | ICD-10-CM | POA: Diagnosis not present

## 2019-06-20 DIAGNOSIS — Z79899 Other long term (current) drug therapy: Secondary | ICD-10-CM

## 2019-06-20 DIAGNOSIS — Z515 Encounter for palliative care: Secondary | ICD-10-CM | POA: Diagnosis not present

## 2019-06-20 DIAGNOSIS — N1831 Chronic kidney disease, stage 3a: Secondary | ICD-10-CM | POA: Diagnosis not present

## 2019-06-20 DIAGNOSIS — S0083XA Contusion of other part of head, initial encounter: Secondary | ICD-10-CM | POA: Diagnosis not present

## 2019-06-20 DIAGNOSIS — R58 Hemorrhage, not elsewhere classified: Secondary | ICD-10-CM | POA: Diagnosis not present

## 2019-06-20 DIAGNOSIS — M810 Age-related osteoporosis without current pathological fracture: Secondary | ICD-10-CM | POA: Diagnosis not present

## 2019-06-20 DIAGNOSIS — Z85828 Personal history of other malignant neoplasm of skin: Secondary | ICD-10-CM

## 2019-06-20 DIAGNOSIS — Z743 Need for continuous supervision: Secondary | ICD-10-CM | POA: Diagnosis not present

## 2019-06-20 DIAGNOSIS — I2510 Atherosclerotic heart disease of native coronary artery without angina pectoris: Secondary | ICD-10-CM | POA: Diagnosis not present

## 2019-06-20 DIAGNOSIS — Z7401 Bed confinement status: Secondary | ICD-10-CM | POA: Diagnosis not present

## 2019-06-20 DIAGNOSIS — M2550 Pain in unspecified joint: Secondary | ICD-10-CM | POA: Diagnosis not present

## 2019-06-20 DIAGNOSIS — E861 Hypovolemia: Secondary | ICD-10-CM | POA: Diagnosis not present

## 2019-06-20 DIAGNOSIS — Z20828 Contact with and (suspected) exposure to other viral communicable diseases: Secondary | ICD-10-CM | POA: Diagnosis present

## 2019-06-20 DIAGNOSIS — N289 Disorder of kidney and ureter, unspecified: Secondary | ICD-10-CM | POA: Diagnosis not present

## 2019-06-20 DIAGNOSIS — Z66 Do not resuscitate: Secondary | ICD-10-CM | POA: Diagnosis not present

## 2019-06-20 DIAGNOSIS — Z86718 Personal history of other venous thrombosis and embolism: Secondary | ICD-10-CM

## 2019-06-20 DIAGNOSIS — Y92129 Unspecified place in nursing home as the place of occurrence of the external cause: Secondary | ICD-10-CM

## 2019-06-20 DIAGNOSIS — S62101A Fracture of unspecified carpal bone, right wrist, initial encounter for closed fracture: Secondary | ICD-10-CM

## 2019-06-20 DIAGNOSIS — W19XXXA Unspecified fall, initial encounter: Secondary | ICD-10-CM

## 2019-06-20 DIAGNOSIS — I10 Essential (primary) hypertension: Secondary | ICD-10-CM | POA: Diagnosis not present

## 2019-06-20 DIAGNOSIS — M5135 Other intervertebral disc degeneration, thoracolumbar region: Secondary | ICD-10-CM | POA: Diagnosis not present

## 2019-06-20 DIAGNOSIS — K219 Gastro-esophageal reflux disease without esophagitis: Secondary | ICD-10-CM | POA: Diagnosis not present

## 2019-06-20 DIAGNOSIS — S40211A Abrasion of right shoulder, initial encounter: Secondary | ICD-10-CM | POA: Diagnosis present

## 2019-06-20 DIAGNOSIS — T148XXA Other injury of unspecified body region, initial encounter: Secondary | ICD-10-CM

## 2019-06-20 DIAGNOSIS — J9610 Chronic respiratory failure, unspecified whether with hypoxia or hypercapnia: Secondary | ICD-10-CM | POA: Diagnosis not present

## 2019-06-20 DIAGNOSIS — S0181XA Laceration without foreign body of other part of head, initial encounter: Secondary | ICD-10-CM | POA: Diagnosis not present

## 2019-06-20 DIAGNOSIS — Z95828 Presence of other vascular implants and grafts: Secondary | ICD-10-CM

## 2019-06-20 DIAGNOSIS — D509 Iron deficiency anemia, unspecified: Secondary | ICD-10-CM | POA: Diagnosis present

## 2019-06-20 DIAGNOSIS — E441 Mild protein-calorie malnutrition: Secondary | ICD-10-CM | POA: Diagnosis not present

## 2019-06-20 DIAGNOSIS — S40011A Contusion of right shoulder, initial encounter: Secondary | ICD-10-CM | POA: Diagnosis not present

## 2019-06-20 DIAGNOSIS — D649 Anemia, unspecified: Secondary | ICD-10-CM | POA: Diagnosis not present

## 2019-06-20 DIAGNOSIS — Z90721 Acquired absence of ovaries, unilateral: Secondary | ICD-10-CM

## 2019-06-20 DIAGNOSIS — S52571A Other intraarticular fracture of lower end of right radius, initial encounter for closed fracture: Secondary | ICD-10-CM | POA: Diagnosis not present

## 2019-06-20 DIAGNOSIS — W1839XA Other fall on same level, initial encounter: Secondary | ICD-10-CM | POA: Diagnosis present

## 2019-06-20 DIAGNOSIS — R55 Syncope and collapse: Secondary | ICD-10-CM | POA: Diagnosis not present

## 2019-06-20 DIAGNOSIS — I959 Hypotension, unspecified: Secondary | ICD-10-CM | POA: Diagnosis not present

## 2019-06-20 DIAGNOSIS — S51011A Laceration without foreign body of right elbow, initial encounter: Secondary | ICD-10-CM | POA: Diagnosis not present

## 2019-06-20 DIAGNOSIS — J439 Emphysema, unspecified: Secondary | ICD-10-CM | POA: Diagnosis not present

## 2019-06-20 DIAGNOSIS — S0990XA Unspecified injury of head, initial encounter: Secondary | ICD-10-CM | POA: Diagnosis not present

## 2019-06-20 DIAGNOSIS — R404 Transient alteration of awareness: Secondary | ICD-10-CM | POA: Diagnosis not present

## 2019-06-20 DIAGNOSIS — E871 Hypo-osmolality and hyponatremia: Secondary | ICD-10-CM | POA: Diagnosis not present

## 2019-06-20 DIAGNOSIS — E785 Hyperlipidemia, unspecified: Secondary | ICD-10-CM | POA: Diagnosis present

## 2019-06-20 DIAGNOSIS — R52 Pain, unspecified: Secondary | ICD-10-CM | POA: Diagnosis not present

## 2019-06-20 DIAGNOSIS — F0390 Unspecified dementia without behavioral disturbance: Secondary | ICD-10-CM | POA: Diagnosis present

## 2019-06-20 DIAGNOSIS — M1990 Unspecified osteoarthritis, unspecified site: Secondary | ICD-10-CM | POA: Diagnosis not present

## 2019-06-20 DIAGNOSIS — I739 Peripheral vascular disease, unspecified: Secondary | ICD-10-CM | POA: Diagnosis present

## 2019-06-20 DIAGNOSIS — Z9981 Dependence on supplemental oxygen: Secondary | ICD-10-CM | POA: Diagnosis not present

## 2019-06-20 DIAGNOSIS — M109 Gout, unspecified: Secondary | ICD-10-CM | POA: Diagnosis not present

## 2019-06-20 DIAGNOSIS — M8588 Other specified disorders of bone density and structure, other site: Secondary | ICD-10-CM | POA: Diagnosis not present

## 2019-06-20 DIAGNOSIS — S52501D Unspecified fracture of the lower end of right radius, subsequent encounter for closed fracture with routine healing: Secondary | ICD-10-CM | POA: Diagnosis not present

## 2019-06-20 DIAGNOSIS — M40204 Unspecified kyphosis, thoracic region: Secondary | ICD-10-CM | POA: Diagnosis not present

## 2019-06-20 DIAGNOSIS — J302 Other seasonal allergic rhinitis: Secondary | ICD-10-CM | POA: Diagnosis not present

## 2019-06-20 DIAGNOSIS — R609 Edema, unspecified: Secondary | ICD-10-CM | POA: Diagnosis not present

## 2019-06-20 DIAGNOSIS — F329 Major depressive disorder, single episode, unspecified: Secondary | ICD-10-CM | POA: Diagnosis present

## 2019-06-20 DIAGNOSIS — S199XXA Unspecified injury of neck, initial encounter: Secondary | ICD-10-CM | POA: Diagnosis not present

## 2019-06-20 DIAGNOSIS — W19XXXD Unspecified fall, subsequent encounter: Secondary | ICD-10-CM | POA: Diagnosis not present

## 2019-06-20 DIAGNOSIS — Z87442 Personal history of urinary calculi: Secondary | ICD-10-CM

## 2019-06-20 DIAGNOSIS — S0511XA Contusion of eyeball and orbital tissues, right eye, initial encounter: Secondary | ICD-10-CM | POA: Diagnosis not present

## 2019-06-20 DIAGNOSIS — G4700 Insomnia, unspecified: Secondary | ICD-10-CM | POA: Diagnosis not present

## 2019-06-20 DIAGNOSIS — F32A Depression, unspecified: Secondary | ICD-10-CM | POA: Diagnosis present

## 2019-06-20 DIAGNOSIS — Y9301 Activity, walking, marching and hiking: Secondary | ICD-10-CM | POA: Diagnosis present

## 2019-06-20 DIAGNOSIS — I25119 Atherosclerotic heart disease of native coronary artery with unspecified angina pectoris: Secondary | ICD-10-CM | POA: Diagnosis not present

## 2019-06-20 LAB — COMPREHENSIVE METABOLIC PANEL
ALT: 11 U/L (ref 0–44)
AST: 20 U/L (ref 15–41)
Albumin: 3.7 g/dL (ref 3.5–5.0)
Alkaline Phosphatase: 35 U/L — ABNORMAL LOW (ref 38–126)
Anion gap: 14 (ref 5–15)
BUN: 43 mg/dL — ABNORMAL HIGH (ref 8–23)
CO2: 24 mmol/L (ref 22–32)
Calcium: 9.6 mg/dL (ref 8.9–10.3)
Chloride: 101 mmol/L (ref 98–111)
Creatinine, Ser: 1.32 mg/dL — ABNORMAL HIGH (ref 0.44–1.00)
GFR calc Af Amer: 39 mL/min — ABNORMAL LOW (ref >60)
GFR calc non Af Amer: 34 mL/min — ABNORMAL LOW (ref >60)
Glucose, Bld: 109 mg/dL — ABNORMAL HIGH (ref 70–99)
Potassium: 4.4 mmol/L (ref 3.5–5.1)
Sodium: 139 mmol/L (ref 135–145)
Total Bilirubin: 0.5 mg/dL (ref 0.3–1.2)
Total Protein: 6.3 g/dL — ABNORMAL LOW (ref 6.5–8.1)

## 2019-06-20 LAB — CBC WITH DIFFERENTIAL/PLATELET
Abs Immature Granulocytes: 0.01 10*3/uL (ref 0.00–0.07)
Basophils Absolute: 0 10*3/uL (ref 0.0–0.1)
Basophils Relative: 0 %
Eosinophils Absolute: 0.1 10*3/uL (ref 0.0–0.5)
Eosinophils Relative: 2 %
HCT: 29.6 % — ABNORMAL LOW (ref 36.0–46.0)
Hemoglobin: 9.5 g/dL — ABNORMAL LOW (ref 12.0–15.0)
Immature Granulocytes: 0 %
Lymphocytes Relative: 40 %
Lymphs Abs: 2.2 10*3/uL (ref 0.7–4.0)
MCH: 32.5 pg (ref 26.0–34.0)
MCHC: 32.1 g/dL (ref 30.0–36.0)
MCV: 101.4 fL — ABNORMAL HIGH (ref 80.0–100.0)
Monocytes Absolute: 0.6 10*3/uL (ref 0.1–1.0)
Monocytes Relative: 10 %
Neutro Abs: 2.6 10*3/uL (ref 1.7–7.7)
Neutrophils Relative %: 48 %
Platelets: 117 10*3/uL — ABNORMAL LOW (ref 150–400)
RBC: 2.92 MIL/uL — ABNORMAL LOW (ref 3.87–5.11)
RDW: 13.2 % (ref 11.5–15.5)
Smear Review: DECREASED
WBC: 5.5 10*3/uL (ref 4.0–10.5)
nRBC: 0 % (ref 0.0–0.2)

## 2019-06-20 LAB — SARS CORONAVIRUS 2 BY RT PCR (HOSPITAL ORDER, PERFORMED IN ~~LOC~~ HOSPITAL LAB): SARS Coronavirus 2: NEGATIVE

## 2019-06-20 LAB — TROPONIN I (HIGH SENSITIVITY): Troponin I (High Sensitivity): 15 ng/L (ref <18)

## 2019-06-20 LAB — TSH: TSH: 4.778 u[IU]/mL — ABNORMAL HIGH (ref 0.350–4.500)

## 2019-06-20 LAB — GLUCOSE, CAPILLARY: Glucose-Capillary: 98 mg/dL (ref 70–99)

## 2019-06-20 MED ORDER — SENNOSIDES-DOCUSATE SODIUM 8.6-50 MG PO TABS
1.0000 | ORAL_TABLET | Freq: Every day | ORAL | Status: DC
  Administered 2019-06-21 – 2019-06-22 (×2): 1 via ORAL
  Filled 2019-06-20 (×3): qty 1

## 2019-06-20 MED ORDER — FENTANYL CITRATE (PF) 100 MCG/2ML IJ SOLN
25.0000 ug | Freq: Once | INTRAMUSCULAR | Status: AC
  Administered 2019-06-20: 25 ug via INTRAVENOUS
  Filled 2019-06-20: qty 2

## 2019-06-20 MED ORDER — HYDROMORPHONE HCL 1 MG/ML IJ SOLN
0.5000 mg | INTRAMUSCULAR | Status: DC | PRN
  Administered 2019-06-21 – 2019-06-22 (×4): 0.5 mg via INTRAVENOUS
  Filled 2019-06-20 (×4): qty 1

## 2019-06-20 MED ORDER — ACETAMINOPHEN 650 MG RE SUPP
650.0000 mg | Freq: Four times a day (QID) | RECTAL | Status: DC | PRN
  Filled 2019-06-20: qty 1

## 2019-06-20 MED ORDER — SODIUM CHLORIDE 0.9 % IV SOLN: INTRAVENOUS | Status: AC

## 2019-06-20 MED ORDER — SERTRALINE HCL 50 MG PO TABS
50.0000 mg | ORAL_TABLET | Freq: Every day | ORAL | Status: DC
  Administered 2019-06-21 – 2019-06-22 (×2): 50 mg via ORAL
  Filled 2019-06-20 (×3): qty 1

## 2019-06-20 MED ORDER — ADULT MULTIVITAMIN W/MINERALS CH
ORAL_TABLET | Freq: Every day | ORAL | Status: DC
  Administered 2019-06-21 – 2019-06-22 (×2): 1 via ORAL
  Filled 2019-06-20 (×3): qty 1

## 2019-06-20 MED ORDER — TRANEXAMIC ACID 1000 MG/10ML IV SOLN
500.0000 mg | Freq: Once | INTRAVENOUS | Status: DC
  Filled 2019-06-20: qty 10

## 2019-06-20 MED ORDER — ISOSORBIDE DINITRATE 10 MG PO TABS
10.0000 mg | ORAL_TABLET | Freq: Two times a day (BID) | ORAL | Status: DC
  Administered 2019-06-21 – 2019-06-22 (×2): 10 mg via ORAL
  Filled 2019-06-20 (×6): qty 1

## 2019-06-20 MED ORDER — MELATONIN 5 MG PO TABS
5.0000 mg | ORAL_TABLET | Freq: Every day | ORAL | Status: DC
  Filled 2019-06-20 (×4): qty 1

## 2019-06-20 MED ORDER — SODIUM CHLORIDE 0.9 % IV SOLN
Freq: Once | INTRAVENOUS | Status: AC
  Administered 2019-06-20: 20:00:00 via INTRAVENOUS

## 2019-06-20 MED ORDER — SODIUM CHLORIDE 0.9% FLUSH
3.0000 mL | Freq: Two times a day (BID) | INTRAVENOUS | Status: DC
  Administered 2019-06-21 – 2019-06-22 (×2): 3 mL via INTRAVENOUS

## 2019-06-20 MED ORDER — ACETAMINOPHEN 325 MG PO TABS: 650.0000 mg | ORAL_TABLET | Freq: Four times a day (QID) | ORAL | Status: DC | PRN

## 2019-06-20 NOTE — ED Provider Notes (Signed)
[facility] Emergency Department Provider Note  ____________________________________________   First MD Initiated Contact with Patient 06/20/19 1809     (approximate)  I have reviewed the triage vital signs and the nursing notes.  History  Chief Complaint Fall    HPI Kayla Galloway is a 85 y.o. female with a history of osteoporosis, hypertension, anemia, emphysema who presents to the emergency department for a fall.  Patient states she had a syncopal episode, which caused her to fall to the ground.  She hit the right side of her head/face as well as her right shoulder and right wrist.  She has a large bleeding hematoma to the right forehead, as well as a hematoma to the right midface.  She has a skin tear to the right elbow, and tenderness and deformity about the distal right wrist.  Patient confirms that she lost consciousness, which resulted in the fall.  She states that she lost consciousness after walking out of the restroom, denies any preceding chest pain, palpitations, shortness of breath.  She denies any tripping or mechanical component.  She is not on any blood thinning medications.  She reports the majority of her pain at the right wrist, which is moderate in severity, sharp, worsened with movement, and does not radiate.  No numbness or tingling.  She is right-hand dominant.    Past Medical Hx Past Medical History:  Diagnosis Date  . Anemia   . Arthritis   . Cancer ([place])    skin ca lesion of scalp- removed  . Depression   . DVT (deep venous thrombosis), left    bilateral, IVC filter 2010  . GERD (gastroesophageal reflux disease)   . Gout   . Hypercholesterolemia   . Hyperkalemia   . Hypertension    Dr. [name]  . Nephrolithiasis   . Obesity   . Osteoporosis   . Peripheral vascular disease ([place])   . 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 Retroperitoneal bleed    erosion of IVC filter through inferior vena  cava  . Spider veins     Problem List Patient Active Problem List   Diagnosis Date Noted  . Encounter for completion of form with patient 09/26/2018  . Hypoxia 07/21/2018  . Abnormal CXR 07/21/2018  . PAD (peripheral artery disease) ([place]) 04/27/2018  . Fracture of oth parts of pelvis, init for clos fx ([place]) 03/22/2018  . Back pain 03/25/2017  . Hyponatremia 02/09/2017  . Rib fractures 02/04/2017  . GERD (gastroesophageal reflux disease) 02/04/2017  . Skin lesions 11/09/2016  . Bilateral lower extremity edema 11/04/2016  . S/P IVC filter 06/24/2016  . Weight loss 04/11/2014  . Anemia 06/28/2012  . Hypertension 06/28/2012  . Hypercholesterolemia 06/28/2012  . Renal insufficiency 06/28/2012  . Osteoporosis 06/28/2012    Past Surgical Hx Past Surgical History:  Procedure Laterality Date  . CARDIAC CATHETERIZATION     2010 Does not see a cardiac  doctor  . Colonsopy    . DENTAL SURGERY    . ECTOPIC PREGNANCY SURGERY    . EYE SURGERY Bilateral 2011,2012   cataracts  . FRACTURE SURGERY  2009   hip, rod from knee to hip  . HEMORRHOID SURGERY    . IVC filter Left    pt states it has turned side ways and could not be removed.  Marland Kitchen KYPHOPLASTY  09/03/2012   per patient, she has had kypho x 2:  Surgeon: Winfield Cunas, MD;  Location: [place] [facility];  Service: Neurosurgery;  Laterality: N/A;  Thoracic eight Kyphoplasty  . OPEN REDUCTION INTERNAL FIXATION (ORIF) DISTAL RADIAL FRACTURE Left 09/06/2015   Procedure: OPEN REDUCTION INTERNAL FIXATION (ORIF) DISTAL RADIAL FRACTURE;  Surgeon: Hessie Knows, MD;  Location: [facility] ORS;  Service: Orthopedics;  Laterality: Left;  . RIGHT OOPHORECTOMY     partial-  . SKIN CANCER EXCISION     top of head  . TONSILLECTOMY     age 60  . VERTEBROPLASTY  12/31/2011   Procedure: VERTEBROPLASTY;  Surgeon: Winfield Cunas, MD;  Location: [place] [facility];  Service: Neurosurgery;  Laterality: N/A;  Thoracic eleven vertebroplasty    Medications Prior to  Admission medications   Medication Sig Start Date End Date Taking? Authorizing Provider  acetaminophen (TYLENOL) 650 MG CR tablet Take 650 mg by mouth every 8 (eight) hours as needed for pain.     [provider]  Calcium 600-200 MG-UNIT tablet Take 1 tablet by mouth 3 (three) times daily with meals.    [provider]  docusate sodium (COLACE) 100 MG capsule Take 2 capsules (200 mg total) by mouth 2 (two) times daily. 02/10/17   Vaughan Basta, MD  ferrous sulfate 325 (65 FE) MG tablet Take 325 mg by mouth every evening.    [provider]  furosemide (LASIX) 20 MG tablet TAKE 1 TABLET BY MOUTH ONCE DAILY 07/19/18   [name], MD  Glucosamine-Chondroitin 250-200 MG TABS Take 1 tablet by mouth 2 (two) times daily.     [provider]  Loratadine 10 MG CAPS Take by mouth.    [provider]  Melatonin 5 MG TABS Take by mouth.    [provider]  Multiple Vitamins-Minerals (CENTRUM SILVER ADULT 50+ PO) Take by mouth.    [provider]  omeprazole (PRILOSEC) 20 MG capsule TAKE 1 CAPSULE BY MOUTH ONCE DAILY 09/16/18   [name], MD  polyethylene glycol (MIRALAX / GLYCOLAX) packet Take 17 g by mouth daily as needed for mild constipation. 02/10/17   Vaughan Basta, MD  ramipril (ALTACE) 2.5 MG capsule TAKE 1 CAPSULE BY MOUTH ONCE DAILY 09/10/18   [name], MD  sertraline (ZOLOFT) 50 MG tablet TAKE 1 TABLET BY MOUTH ONCE DAILY 10/18/18   [name], MD    Allergies Cefuroxime axetil, Doxycycline, Advil [ibuprofen], Celebrex [celecoxib], Daypro [oxaprozin], Etodolac, Macrobid [nitrofurantoin monohyd macro], Penicillins, Percocet [oxycodone-acetaminophen], Tramadol, Valium [diazepam], Neosporin [neomycin-bacitracin zn-polymyx], and Vicodin [hydrocodone-acetaminophen]  Family Hx Family History  Problem Relation Age of Onset  . Heart disease Father        myocardial infarction  . Heart disease Brother         myocardial infarction  . Lymphoma Sister   . Anesthesia problems Neg Hx   . Breast cancer Neg Hx   . Colon cancer Neg Hx     Social Hx Social History   Tobacco Use  . Smoking status: Never Smoker  . Smokeless tobacco: Never Used  Substance Use Topics  . Alcohol use: No    Alcohol/week: 0.0 standard drinks  . Drug use: No     Review of Systems  Constitutional: Negative for fever. Negative for chills. Eyes: Negative for visual changes. ENT: Negative for sore throat. Cardiovascular: Negative for chest pain. Respiratory: Negative for shortness of breath. Gastrointestinal: Negative for abdominal pain. Negative for nausea. Negative for vomiting. Genitourinary: Negative for dysuria. Musculoskeletal: + wrist pain, shoulder pain Skin: + hematomas, abrasion Neurological: Negative for for headaches. +  LOC   Physical Exam  Vital Signs: ED Triage Vitals  Enc Vitals Group     BP 06/20/19 1801 (!) 149/74     Pulse --      Resp --      Temp --      Temp src --      SpO2 --      Weight 06/20/19 1802 120 lb (54.4 kg)     Height 06/20/19 1802 5' (1.524 m)     Head Circumference --      Peak Flow --      Pain Score 06/20/19 1803 8     Pain Loc --      Pain Edu? --      Excl. in [place]? --     Constitutional: Alert and oriented. [place] 15.  Eyes: Conjunctivae clear. Sclera anicteric. EOMI without evidence of entrapment. Bilateral periorbital ecchymosis. Head: Large bleeding hematoma to the right forehead, with central skin defect. Large hematoma to the right midface with overlying superficial laceration. Nose: No epistaxis.   Mouth/Throat: No intraoral or dental trauma. Jaw is well aligned.  Neck: No midline CS tenderness. No stridor.   Cardiovascular: Normal rate, irregular. No murmurs. Extremities well perfused. Chest: Chest wall is stable and NT. No crepitance.   Respiratory: Normal respiratory effort.  Lungs CTAB. Gastrointestinal: Soft and non-tender. No distention.   Pelvis: Stable and NT to AP and lateral compression.  Musculoskeletal: Deformity about the distal right wrist with associated tenderness.  Distally neurovascularly intact, with motor/sensation intact in radial, ulnar, median distribution.  2+ symmetric radial pulses.  Fingers warm and well perfused. Neurologic:  Normal speech and language. Moves all extremities equally. GCS 15.  Skin: As above. Psychiatric: Mood and affect are appropriate for situation.  EKG  Personally reviewed.   Rate: 66 Rhythm: sinus, with frequent PACs Axis: LAD Intervals: QRS 119 ms, appears generally unchanged from prior Sinus rhythm, with frequent PACs, often bigeminy pattern No STEMI   Radiology  XR RIGHT shoulder: IMPRESSION:  1. No acute abnormality or significant interval change.  2. Acromial spur.  3. Stable chronic lung disease.   XR RIGHT wrist: IMPRESSION:  1. Impacted fracture of the distal radius with probable  intra-articular extension.  2. Advanced degenerative changes.   CT: IMPRESSION: 1. No CT evidence for acute intracranial abnormality. Atrophy and small vessel ischemic changes of the white matter. 2. No acute facial bone fracture 3. Stable alignment of cervical spine with degenerative changes. No acute fracture 4. Moderate to large right forehead, periorbital and premalar soft tissue hematoma  Procedures  Procedure(s) performed (including critical care):  Procedures   Initial Impression / Assessment and Plan / ED Course  85 y.o. female who presents to the ED for a syncopal fall, with resultant right-sided facial injuries, right shoulder and right wrist pain.  Will obtain imaging to evaluate for any acute traumatic injuries, as well as blood work and EKG given her syncopal component.  XR reveals a right distal radius fracture, will splint.  CT imaging is negative for any acute intracranial abnormalities or facial bone fractures.  Unfortunately the hematoma to her  forehead is not amenable to laceration, given it is primarily a central skin deficit rather than a true laceration.  Dressed with combat gauze soaked TXA.  Labs reveal mild AKI, receiving fluids, initial troponin negative.  EKG reveals sinus rhythm with frequent PACs, often in bigeminy.  We will plan for admission for further syncope work-up, orthopedics aware of her  wrist fracture.  Final Clinical Impression(s) / ED Diagnosis  Final diagnoses:  Fall, initial encounter  Loss of consciousness ([place])  Hematoma  Facial laceration, initial encounter  Closed fracture of right wrist, initial encounter     Note:  This document was prepared using Dragon voice recognition software and may include unintentional dictation errors.   Lilia Pro., MD 06/20/19 2150

## 2019-06-20 NOTE — H&P (Signed)
History and Physical    Kayla Galloway [id number] DOB: 06-18-1921 DOA: 06/20/2019  PCP: [name], MD  Patient coming from: [facility] facility  I have personally briefly reviewed patient's old medical records in [place]  Chief Complaint: Syncope with injury to head and right upper extremity  HPI: Kayla Galloway is a 85 y.o. female with medical history significant for pulmonary emphysema/chronic respiratory failure on supplemental O2, CAD, hypertension, hyperlipidemia, history of DVT s/p IVC filter, dementia, and depression who presents to the ED for evaluation of syncope.  Patient states she normally ambulates with a assistance of a walker.  Earlier today she went to the bathroom and has she was walking out of the bathroom she suddenly passed out and fell to the ground hitting the right side of her body.  She had pain to her right shoulder, right wrist, and right side of her head.  She reports chronic intermittent chest pain but does not recall any chest pain prior to this episode.  She is not sure if she had any palpitations.  She denies any nausea, vomiting, diaphoresis, dysuria, diarrhea, or focal weakness.  ED Course:  Initial vitals showed BP 149/74, pulse 63, RR 26, SPO2 100% on room air.  Labs are notable for BUN 43, creatinine 1.32, sodium 139, potassium 4.4, bicarb 24, serum glucose 109, WBC 5.5, hemoglobin 9.5, platelets 117,000, high-sensitivity troponin I 15.  Urinalysis is ordered and pending.  SARS-CoV-2 test is obtained and pending.  Right shoulder x-ray is negative for acute abnormality/fracture.  Right wrist x-ray shows an impacted fracture of the distal radius.  CT head/cervical spine/maxillofacial were negative for intracranial abnormality, acute facial bone fracture, or acute fracture of the cervical spine.  Moderate to large right forehead, periorbital and premalar soft tissue hematoma noted as well as atrophy and small vessel  ischemic changes of the white matter of the brain.  Right wrist fracture was splinted and wrapped.  EDP discussed with orthopedics who are aware.  Patient was given gentle IV fluid.  Hematoma to head was dressed with gauze.  The hospitalist service was consulted admit for further evaluation and management of syncope.  Review of Systems: All systems reviewed and are negative except as documented in history of present illness above.   Past Medical History:  Diagnosis Date  . Anemia   . Arthritis   . Cancer ([place])    skin ca lesion of scalp- removed  . Depression   . DVT (deep venous thrombosis), left    bilateral, IVC filter 2010  . GERD (gastroesophageal reflux disease)   . Gout   . Hypercholesterolemia   . Hyperkalemia   . Hypertension    Dr. [name]  . Nephrolithiasis   . Obesity   . Osteoporosis   . Peripheral vascular disease ([place])   . 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 Spider veins     Past Surgical History:  Procedure Laterality Date  . CARDIAC CATHETERIZATION     2010 Does not see a cardiac  doctor  . Colonsopy    . DENTAL SURGERY    . ECTOPIC PREGNANCY SURGERY    . EYE SURGERY Bilateral 2011,2012   cataracts  . FRACTURE SURGERY  2009   hip, rod from knee to hip  . HEMORRHOID SURGERY    . IVC filter Left    pt states  it has turned side ways and could not be removed.  Marland Kitchen KYPHOPLASTY  09/03/2012   per patient, she has had kypho x 2:  Surgeon: Winfield Cunas, MD;  Location: [place] [place] [facility];  Service: Neurosurgery;  Laterality: N/A;  Thoracic eight Kyphoplasty  . OPEN REDUCTION INTERNAL FIXATION (ORIF) DISTAL RADIAL FRACTURE Left 09/06/2015   Procedure: OPEN REDUCTION INTERNAL FIXATION (ORIF) DISTAL RADIAL FRACTURE;  Surgeon: Hessie Knows, MD;  Location: [facility] ORS;  Service: Orthopedics;  Laterality: Left;  . RIGHT OOPHORECTOMY     partial-  . SKIN CANCER  EXCISION     top of head  . TONSILLECTOMY     age 63  . VERTEBROPLASTY  12/31/2011   Procedure: VERTEBROPLASTY;  Surgeon: Winfield Cunas, MD;  Location: [place] [facility];  Service: Neurosurgery;  Laterality: N/A;  Thoracic eleven vertebroplasty    Social History:  reports that she has never smoked. She has never used smokeless tobacco. She reports that she does not drink alcohol or use drugs.  Allergies  Allergen Reactions  . Cefuroxime Axetil Rash    Pt states that she does fine with Keflex.    . Doxycycline Nausea Only and Other (See Comments)    Reaction:  Weight loss   . Advil [Ibuprofen] Swelling  . Celebrex [Celecoxib] Nausea Only  . Daypro [Oxaprozin] Other (See Comments)    Reaction:  Dizziness   . Etodolac Nausea Only  . Macrobid [employer] Macro] Other (See Comments)    Reaction:  Burning   . Penicillins Other (See Comments)    Reaction:  Burning   . Percocet [Oxycodone-Acetaminophen] Nausea Only and Swelling  . Tramadol Other (See Comments)    Reaction:  Unknown   . Valium [Diazepam] Other (See Comments)    Reaction:  Dizziness    . Neosporin [Neomycin-Bacitracin Zn-Polymyx] Rash  . Vicodin [Hydrocodone-Acetaminophen] Nausea Only    Family History  Problem Relation Age of Onset  . Heart disease Father        myocardial infarction  . Heart disease Brother        myocardial infarction  . Lymphoma Sister   . Anesthesia problems Neg Hx   . Breast cancer Neg Hx   . Colon cancer Neg Hx      Prior to Admission medications   Medication Sig Start Date End Date Taking? Authorizing Provider  acetaminophen (TYLENOL) 650 MG CR tablet Take 650 mg by mouth every 4 (four) hours as needed for pain. Do not exceed 3 grams of APAP/day from all sources   Yes [provider]  aspirin EC 81 MG tablet Take 81 mg by mouth daily.   Yes [provider]  Calcium 600-200 MG-UNIT tablet Take 1 tablet by mouth 2 (two) times daily.    Yes [provider]  ferrous sulfate 325 (65 FE) MG tablet Take 325 mg by mouth 2 (two) times daily.    Yes [provider]  Glucosamine-Chondroitin 250-200 MG TABS Take 1 tablet by mouth 2 (two) times daily.    Yes [provider]  isosorbide dinitrate (ISORDIL) 10 MG tablet Take 10 mg by mouth 2 (two) times daily.   Yes [provider]  Melatonin 5 MG TABS Take 5 mg by mouth at bedtime.    Yes [provider]  Multiple Vitamins-Minerals (CENTRUM SILVER ADULT 50+ PO) Take 1 tablet by mouth daily.    Yes [provider]  nitroGLYCERIN (NITROSTAT) 0.4 MG SL tablet Place 0.4 mg under the  tongue every 5 (five) minutes as needed for chest pain.   Yes [provider]  ondansetron (ZOFRAN) 4 MG tablet Take 4 mg by mouth every 6 (six) hours as needed for nausea or vomiting.   Yes [provider]  ramipril (ALTACE) 2.5 MG capsule TAKE 1 CAPSULE BY MOUTH ONCE DAILY Patient taking differently: Take 5 mg by mouth daily.  09/10/18  Yes [name], MD  senna-docusate (SENOKOT-S) 8.6-50 MG tablet Take 1 tablet by mouth daily.   Yes [provider]  sertraline (ZOLOFT) 50 MG tablet TAKE 1 TABLET BY MOUTH ONCE DAILY 10/18/18  Yes [name], MD    Physical Exam: Vitals:   06/20/19 2130 06/20/19 2230 06/20/19 2245 06/20/19 2300  BP: (!) 163/65 (!) 152/53    Pulse: 69 (!) 53 71 (!) 120  Resp: 19 17 15 16   SpO2: 100% 100% 100% 100%  Weight:      Height:        Constitutional: Elderly woman resting supine in bed, NAD, calm Head: Forehead wrapped in gauze Eyes: Swelling and hematoma at the right eye, left eye pupil reactive to light ENMT: Mucous membranes are dry. Posterior pharynx clear of any exudate or lesions. Neck: normal, supple, no masses. Respiratory: Fine inspiratory crackles bilateral lung fields.  Normal respiratory effort. No accessory muscle use.  Cardiovascular: Irregularly irregular, no murmurs / rubs / gallops. No  extremity edema. 2+ pedal pulses. Abdomen: no tenderness, no masses palpated. No hepatosplenomegaly. Bowel sounds positive.  Musculoskeletal: Right wrist splinted and wrapped extremities. Good ROM in both shoulders and lower extremities. Normal muscle tone.  Skin: Hematoma and swelling of the right eye, right shoulder abrasion, right wrist splinted and wrapped, hematoma of right forehead wrapped Neurologic: Sensation intact, Strength 5/5 in all 4.  Psychiatric: Normal judgment and insight. Alert and oriented x 3. Normal mood.   Labs on Admission: I have personally reviewed following labs and imaging studies  CBC: Recent Labs  Lab 06/20/19 1821  WBC 5.5  NEUTROABS 2.6  HGB 9.5*  HCT 29.6*  MCV 101.4*  PLT 123XX123*   Basic Metabolic Panel: Recent Labs  Lab 06/20/19 1821  NA 139  K 4.4  CL 101  CO2 24  GLUCOSE 109*  BUN 43*  CREATININE 1.32*  CALCIUM 9.6   GFR: Estimated Creatinine Clearance: 17.9 mL/min (A) (by C-G formula based on SCr of 1.32 mg/dL (H)). Liver Function Tests: Recent Labs  Lab 06/20/19 1821  AST 20  ALT 11  ALKPHOS 35*  BILITOT 0.5  PROT 6.3*  ALBUMIN 3.7   No results for input(s): LIPASE, AMYLASE in the last 168 hours. No results for input(s): AMMONIA in the last 168 hours. Coagulation Profile: No results for input(s): INR, PROTIME in the last 168 hours. Cardiac Enzymes: No results for input(s): CKTOTAL, CKMB, CKMBINDEX, TROPONINI in the last 168 hours. BNP (last 3 results) No results for input(s): PROBNP in the last 8760 hours. HbA1C: No results for input(s): HGBA1C in the last 72 hours. CBG: Recent Labs  Lab 06/20/19 1757  GLUCAP 98   Lipid Profile: No results for input(s): CHOL, HDL, LDLCALC, TRIG, CHOLHDL, LDLDIRECT in the last 72 hours. Thyroid Function Tests: No results for input(s): TSH, T4TOTAL, FREET4, T3FREE, THYROIDAB in the last 72 hours. Anemia Panel: No results for input(s): VITAMINB12, FOLATE, FERRITIN, TIBC, IRON,  RETICCTPCT in the last 72 hours. Urine analysis:    Component Value Date/Time   COLORURINE YELLOW (A) 03/22/2018 2240   APPEARANCEUR HAZY (A)  03/22/2018 2240   APPEARANCEUR Clear 08/04/2012 2154   LABSPEC 1.011 03/22/2018 2240   LABSPEC 1.009 08/04/2012 2154   PHURINE 5.0 03/22/2018 2240   GLUCOSEU NEGATIVE 03/22/2018 2240   GLUCOSEU NEGATIVE 06/18/2016 1040   HGBUR MODERATE (A) 03/22/2018 [place] 03/22/2018 2240   BILIRUBINUR negative 09/24/2017 1501   BILIRUBINUR neg 08/15/2014 1102   BILIRUBINUR Negative 08/04/2012 2154   KETONESUR NEGATIVE 03/22/2018 2240   PROTEINUR NEGATIVE 03/22/2018 2240   UROBILINOGEN 0.2 09/24/2017 1501   UROBILINOGEN 0.2 06/18/2016 1040   NITRITE NEGATIVE 03/22/2018 [place] 03/22/2018 2240   LEUKOCYTESUR Negative 08/04/2012 2154    Radiological Exams on Admission: Dg Shoulder Right  Result Date: 06/20/2019 CLINICAL DATA:  Fall. Right shoulder bruising. EXAM: RIGHT SHOULDER - 2+ VIEW COMPARISON:  Two-view chest x-ray 03/25/2017 FINDINGS: The right shoulder is located. Acromial spur is noted. No acute fracture is present. Chronic lung changes are stable. No acute fractures are present. IMPRESSION: 1. No acute abnormality or significant interval change. 2. Acromial spur. 3. Stable chronic lung disease. Electronically Signed   By: San Morelle M.D.   On: 06/20/2019 19:02   Dg Wrist Complete Right  Result Date: 06/20/2019 CLINICAL DATA:  Fall.  Right wrist deformity. EXAM: RIGHT WRIST - COMPLETE 3+ VIEW COMPARISON:  None. FINDINGS: Distal radial fracture demonstrates impaction. There is probable intra-articular extension. No significant angulation is present. Carpal bones are intact. Ulna is unremarkable. Advanced degenerative changes are noted at the first [facility] joint with significant radial subluxation. IMPRESSION: 1. Impacted fracture of the distal radius with probable intra-articular extension. 2. Advanced  degenerative changes. Electronically Signed   By: San Morelle M.D.   On: 06/20/2019 19:04   Ct Head Wo Contrast  Result Date: 06/20/2019 CLINICAL DATA:  Syncopal episode hit head on floor laceration to right forehead EXAM: CT HEAD WITHOUT CONTRAST CT MAXILLOFACIAL WITHOUT CONTRAST CT CERVICAL SPINE WITHOUT CONTRAST TECHNIQUE: Multidetector CT imaging of the head, cervical spine, and maxillofacial structures were performed using the standard protocol without intravenous contrast. Multiplanar CT image reconstructions of the cervical spine and maxillofacial structures were also generated. COMPARISON:  CT 02/04/2017 FINDINGS: CT HEAD FINDINGS Brain: no acute territorial infarction, hemorrhage or intracranial mass. Atrophy and moderate small vessel ischemic changes of the white matter. Stable ventricle size. Vascular: No hyperdense vessels.  Carotid vascular calcification Skull: Normal. Negative for fracture or focal lesion. Other: Moderate to large right forehead hematoma and laceration CT MAXILLOFACIAL FINDINGS Osseous: No mandibular fracture. Pterygoid plates and zygomatic arches are intact. Mastoid air cells are clear. No nasal bone fracture Orbits: Negative. No traumatic or inflammatory finding. Sinuses: Moderate circumferential thickening in the left maxillary sinus with scattered calcification and hyperdense secretions. No acute sinus wall fracture. Bony wall thickening of left maxillary sinus consistent with chronic sinusitis. Soft tissues: Large right forehead soft tissue hematoma. Moderate right periorbital soft tissue swelling. Moderate right premalar soft tissue stranding CT CERVICAL SPINE FINDINGS Alignment: Stable 3.5 mm anterolisthesis C3 on C4. Stable 2 mm retrolisthesis C3 on C4. Stable 2 mm retrolisthesis C5 on C6. Trace retrolisthesis C6 on C7. Facet alignment within normal limits. Skull base and vertebrae: No acute fracture. No primary bone lesion or focal pathologic process. Soft tissues  and spinal canal: No prevertebral fluid or swelling. No visible canal hematoma. Disc levels: Prominent calcification and pannus at C1-C2 articulation. Advanced degenerative changes C3 through C7. Multiple level facet degenerative change and multiple level foraminal stenosis. Upper chest: Apical fibrosis.  Other: None IMPRESSION: 1. No CT evidence for acute intracranial abnormality. Atrophy and small vessel ischemic changes of the white matter. 2. No acute facial bone fracture 3. Stable alignment of cervical spine with degenerative changes. No acute fracture 4. Moderate to large right forehead, periorbital and premalar soft tissue hematoma Electronically Signed   By: Donavan Foil M.D.   On: 06/20/2019 19:30   Ct Cervical Spine Wo Contrast  Result Date: 06/20/2019 CLINICAL DATA:  Syncopal episode hit head on floor laceration to right forehead EXAM: CT HEAD WITHOUT CONTRAST CT MAXILLOFACIAL WITHOUT CONTRAST CT CERVICAL SPINE WITHOUT CONTRAST TECHNIQUE: Multidetector CT imaging of the head, cervical spine, and maxillofacial structures were performed using the standard protocol without intravenous contrast. Multiplanar CT image reconstructions of the cervical spine and maxillofacial structures were also generated. COMPARISON:  CT 02/04/2017 FINDINGS: CT HEAD FINDINGS Brain: no acute territorial infarction, hemorrhage or intracranial mass. Atrophy and moderate small vessel ischemic changes of the white matter. Stable ventricle size. Vascular: No hyperdense vessels.  Carotid vascular calcification Skull: Normal. Negative for fracture or focal lesion. Other: Moderate to large right forehead hematoma and laceration CT MAXILLOFACIAL FINDINGS Osseous: No mandibular fracture. Pterygoid plates and zygomatic arches are intact. Mastoid air cells are clear. No nasal bone fracture Orbits: Negative. No traumatic or inflammatory finding. Sinuses: Moderate circumferential thickening in the left maxillary sinus with scattered  calcification and hyperdense secretions. No acute sinus wall fracture. Bony wall thickening of left maxillary sinus consistent with chronic sinusitis. Soft tissues: Large right forehead soft tissue hematoma. Moderate right periorbital soft tissue swelling. Moderate right premalar soft tissue stranding CT CERVICAL SPINE FINDINGS Alignment: Stable 3.5 mm anterolisthesis C3 on C4. Stable 2 mm retrolisthesis C3 on C4. Stable 2 mm retrolisthesis C5 on C6. Trace retrolisthesis C6 on C7. Facet alignment within normal limits. Skull base and vertebrae: No acute fracture. No primary bone lesion or focal pathologic process. Soft tissues and spinal canal: No prevertebral fluid or swelling. No visible canal hematoma. Disc levels: Prominent calcification and pannus at C1-C2 articulation. Advanced degenerative changes C3 through C7. Multiple level facet degenerative change and multiple level foraminal stenosis. Upper chest: Apical fibrosis. Other: None IMPRESSION: 1. No CT evidence for acute intracranial abnormality. Atrophy and small vessel ischemic changes of the white matter. 2. No acute facial bone fracture 3. Stable alignment of cervical spine with degenerative changes. No acute fracture 4. Moderate to large right forehead, periorbital and premalar soft tissue hematoma Electronically Signed   By: Donavan Foil M.D.   On: 06/20/2019 19:30   Ct Maxillofacial Wo Cm  Result Date: 06/20/2019 CLINICAL DATA:  Syncopal episode hit head on floor laceration to right forehead EXAM: CT HEAD WITHOUT CONTRAST CT MAXILLOFACIAL WITHOUT CONTRAST CT CERVICAL SPINE WITHOUT CONTRAST TECHNIQUE: Multidetector CT imaging of the head, cervical spine, and maxillofacial structures were performed using the standard protocol without intravenous contrast. Multiplanar CT image reconstructions of the cervical spine and maxillofacial structures were also generated. COMPARISON:  CT 02/04/2017 FINDINGS: CT HEAD FINDINGS Brain: no acute territorial  infarction, hemorrhage or intracranial mass. Atrophy and moderate small vessel ischemic changes of the white matter. Stable ventricle size. Vascular: No hyperdense vessels.  Carotid vascular calcification Skull: Normal. Negative for fracture or focal lesion. Other: Moderate to large right forehead hematoma and laceration CT MAXILLOFACIAL FINDINGS Osseous: No mandibular fracture. Pterygoid plates and zygomatic arches are intact. Mastoid air cells are clear. No nasal bone fracture Orbits: Negative. No traumatic or inflammatory finding. Sinuses: Moderate circumferential thickening in the  left maxillary sinus with scattered calcification and hyperdense secretions. No acute sinus wall fracture. Bony wall thickening of left maxillary sinus consistent with chronic sinusitis. Soft tissues: Large right forehead soft tissue hematoma. Moderate right periorbital soft tissue swelling. Moderate right premalar soft tissue stranding CT CERVICAL SPINE FINDINGS Alignment: Stable 3.5 mm anterolisthesis C3 on C4. Stable 2 mm retrolisthesis C3 on C4. Stable 2 mm retrolisthesis C5 on C6. Trace retrolisthesis C6 on C7. Facet alignment within normal limits. Skull base and vertebrae: No acute fracture. No primary bone lesion or focal pathologic process. Soft tissues and spinal canal: No prevertebral fluid or swelling. No visible canal hematoma. Disc levels: Prominent calcification and pannus at C1-C2 articulation. Advanced degenerative changes C3 through C7. Multiple level facet degenerative change and multiple level foraminal stenosis. Upper chest: Apical fibrosis. Other: None IMPRESSION: 1. No CT evidence for acute intracranial abnormality. Atrophy and small vessel ischemic changes of the white matter. 2. No acute facial bone fracture 3. Stable alignment of cervical spine with degenerative changes. No acute fracture 4. Moderate to large right forehead, periorbital and premalar soft tissue hematoma Electronically Signed   By: Donavan Foil  M.D.   On: 06/20/2019 19:30    EKG: Independently reviewed.  Sinus rhythm with nonspecific IVCD, LAD, questionable supraventricular bigeminy.  Assessment/Plan Principal Problem:   Syncope Active Problems:   Hypertension   Depression   CAD (coronary artery disease)   Pulmonary emphysema (HCC)   AKI (acute kidney injury) (HCC)  TERRELL WOBIG is a 85 y.o. female with medical history significant for pulmonary emphysema/chronic respiratory failure on supplemental O2, CAD, hypertension, hyperlipidemia, history of DVT s/p IVC filter, dementia, and depression who is admitted after a syncopal episode with fall and injury.   Syncope: Unclear etiology, possibly cardiogenic given nonspecific IVCD and occasional bigeminy seen on EKG. -Admit to telemetry -Check orthostatic vital signs -Obtain echocardiogram -Start gentle IV fluid hydration overnight  Fall with injury -right distal radius fracture, right shoulder abrasion, right-sided hematoma of the head: Right wrist splinted and wrapped.  CT head/cervical spine/maxillofacial negative for acute fractures or intracranial bleed. -PT/OT eval  AKI: Mild, suspect prerenal from hypovolemia.  Continue gentle IV fluids overnight and hold home ramipril.  Pulmonary emphysema with chronic respiratory failure: Chronic and stable.  Continue supplemental O2.  CAD: Denies any chest pain.  Troponin negative.  Aspirin on hold given hematoma.  Hypertension: Mildly hypertensive on admission.  Holding ramipril as above.  Will continue Isordil.  Depression: Continue sertraline.  Dementia: Alert and oriented on admission.  Continue delirium precautions.  DVT prophylaxis: SCDs Code Status: DNR, confirmed with patient Family Communication: Discussed with nephew and power of attorney Marcie Mowers by phone [phone]. Disposition Plan: Pending clinical progress Consults called: EDP discussed with orthopedics who are aware of right wrist  fracture Admission status: Observation   Zada Finders MD [facility]  If 7PM-7AM, please contact night-coverage [email or web address]  06/20/2019, 11:07 PM

## 2019-06-20 NOTE — ED Notes (Signed)
Quick clot guaze given to Dr. Joan Mayans at this time to dress forehead.

## 2019-06-20 NOTE — ED Triage Notes (Signed)
Pt to ED via EMS from [facility] c/o fall today.  Possible syncopal episode while walking to the bathroom, pt hit anterior head on tile floor.  Pt presents with bleeding laceration to right forehead above eye, hematoma under right eye, skin tear and bruise to right shoulder, bruise and deformity to right wrist.  Presents A&Ox4, chest rise even and unlabored.

## 2019-06-20 NOTE — ED Notes (Signed)
Pt returned to room from scans.

## 2019-06-20 NOTE — ED Notes (Signed)
Pure wick placed.

## 2019-06-20 NOTE — ED Notes (Signed)
Ok to d/c 2nd trop per Dr. Joan Mayans

## 2019-06-21 ENCOUNTER — Other Ambulatory Visit: Payer: Self-pay

## 2019-06-21 ENCOUNTER — Inpatient Hospital Stay
Admit: 2019-06-21 | Discharge: 2019-06-21 | Disposition: A | Discharge status: Home / Self Care | Payer: Medicare Other | Attending: Internal Medicine | Admitting: Internal Medicine

## 2019-06-21 DIAGNOSIS — S0083XA Contusion of other part of head, initial encounter: Secondary | ICD-10-CM | POA: Diagnosis present

## 2019-06-21 DIAGNOSIS — I509 Heart failure, unspecified: Secondary | ICD-10-CM

## 2019-06-21 DIAGNOSIS — I2510 Atherosclerotic heart disease of native coronary artery without angina pectoris: Secondary | ICD-10-CM | POA: Diagnosis present

## 2019-06-21 DIAGNOSIS — E7800 Pure hypercholesterolemia, unspecified: Secondary | ICD-10-CM | POA: Diagnosis present

## 2019-06-21 DIAGNOSIS — W19XXXA Unspecified fall, initial encounter: Secondary | ICD-10-CM | POA: Diagnosis not present

## 2019-06-21 DIAGNOSIS — F329 Major depressive disorder, single episode, unspecified: Secondary | ICD-10-CM | POA: Diagnosis present

## 2019-06-21 DIAGNOSIS — S52501A Unspecified fracture of the lower end of right radius, initial encounter for closed fracture: Secondary | ICD-10-CM | POA: Diagnosis present

## 2019-06-21 DIAGNOSIS — I340 Nonrheumatic mitral (valve) insufficiency: Secondary | ICD-10-CM | POA: Diagnosis present

## 2019-06-21 DIAGNOSIS — Z515 Encounter for palliative care: Secondary | ICD-10-CM | POA: Diagnosis not present

## 2019-06-21 DIAGNOSIS — Z90721 Acquired absence of ovaries, unilateral: Secondary | ICD-10-CM | POA: Diagnosis not present

## 2019-06-21 DIAGNOSIS — Y92129 Unspecified place in nursing home as the place of occurrence of the external cause: Secondary | ICD-10-CM | POA: Diagnosis not present

## 2019-06-21 DIAGNOSIS — F0390 Unspecified dementia without behavioral disturbance: Secondary | ICD-10-CM | POA: Diagnosis present

## 2019-06-21 DIAGNOSIS — S52571A Other intraarticular fracture of lower end of right radius, initial encounter for closed fracture: Secondary | ICD-10-CM | POA: Diagnosis present

## 2019-06-21 DIAGNOSIS — S40011A Contusion of right shoulder, initial encounter: Secondary | ICD-10-CM | POA: Diagnosis present

## 2019-06-21 DIAGNOSIS — S51011A Laceration without foreign body of right elbow, initial encounter: Secondary | ICD-10-CM | POA: Diagnosis present

## 2019-06-21 DIAGNOSIS — T148XXA Other injury of unspecified body region, initial encounter: Secondary | ICD-10-CM | POA: Diagnosis not present

## 2019-06-21 DIAGNOSIS — J9610 Chronic respiratory failure, unspecified whether with hypoxia or hypercapnia: Secondary | ICD-10-CM | POA: Diagnosis present

## 2019-06-21 DIAGNOSIS — Y9301 Activity, walking, marching and hiking: Secondary | ICD-10-CM | POA: Diagnosis present

## 2019-06-21 DIAGNOSIS — I739 Peripheral vascular disease, unspecified: Secondary | ICD-10-CM | POA: Diagnosis present

## 2019-06-21 DIAGNOSIS — M1990 Unspecified osteoarthritis, unspecified site: Secondary | ICD-10-CM | POA: Diagnosis present

## 2019-06-21 DIAGNOSIS — S40211A Abrasion of right shoulder, initial encounter: Secondary | ICD-10-CM | POA: Diagnosis present

## 2019-06-21 DIAGNOSIS — J439 Emphysema, unspecified: Secondary | ICD-10-CM | POA: Diagnosis present

## 2019-06-21 DIAGNOSIS — R55 Syncope and collapse: Secondary | ICD-10-CM | POA: Diagnosis present

## 2019-06-21 DIAGNOSIS — D509 Iron deficiency anemia, unspecified: Secondary | ICD-10-CM | POA: Diagnosis present

## 2019-06-21 DIAGNOSIS — E861 Hypovolemia: Secondary | ICD-10-CM | POA: Diagnosis present

## 2019-06-21 DIAGNOSIS — K219 Gastro-esophageal reflux disease without esophagitis: Secondary | ICD-10-CM | POA: Diagnosis present

## 2019-06-21 DIAGNOSIS — M810 Age-related osteoporosis without current pathological fracture: Secondary | ICD-10-CM | POA: Diagnosis present

## 2019-06-21 DIAGNOSIS — Z66 Do not resuscitate: Secondary | ICD-10-CM | POA: Diagnosis present

## 2019-06-21 DIAGNOSIS — S0181XA Laceration without foreign body of other part of head, initial encounter: Secondary | ICD-10-CM | POA: Diagnosis present

## 2019-06-21 DIAGNOSIS — Z20828 Contact with and (suspected) exposure to other viral communicable diseases: Secondary | ICD-10-CM | POA: Diagnosis present

## 2019-06-21 DIAGNOSIS — E785 Hyperlipidemia, unspecified: Secondary | ICD-10-CM | POA: Diagnosis present

## 2019-06-21 DIAGNOSIS — N179 Acute kidney failure, unspecified: Secondary | ICD-10-CM | POA: Diagnosis present

## 2019-06-21 DIAGNOSIS — E860 Dehydration: Secondary | ICD-10-CM | POA: Diagnosis present

## 2019-06-21 DIAGNOSIS — W1839XA Other fall on same level, initial encounter: Secondary | ICD-10-CM | POA: Diagnosis present

## 2019-06-21 LAB — URINALYSIS, COMPLETE (UACMP) WITH MICROSCOPIC
Bacteria, UA: NONE SEEN
Bilirubin Urine: NEGATIVE
Glucose, UA: NEGATIVE mg/dL
Hgb urine dipstick: NEGATIVE
Ketones, ur: NEGATIVE mg/dL
Nitrite: NEGATIVE
Protein, ur: NEGATIVE mg/dL
RBC / HPF: >50 RBC/hpf — ABNORMAL HIGH (ref 0–5)
Specific Gravity, Urine: 1.015 (ref 1.005–1.030)
WBC, UA: >50 WBC/hpf — ABNORMAL HIGH (ref 0–5)
pH: 6 (ref 5.0–8.0)

## 2019-06-21 LAB — CBC
HCT: 28.3 % — ABNORMAL LOW (ref 36.0–46.0)
Hemoglobin: 9.1 g/dL — ABNORMAL LOW (ref 12.0–15.0)
MCH: 32.2 pg (ref 26.0–34.0)
MCHC: 32.2 g/dL (ref 30.0–36.0)
MCV: 100 fL (ref 80.0–100.0)
Platelets: 109 10*3/uL — ABNORMAL LOW (ref 150–400)
RBC: 2.83 MIL/uL — ABNORMAL LOW (ref 3.87–5.11)
RDW: 13 % (ref 11.5–15.5)
WBC: 5.9 10*3/uL (ref 4.0–10.5)
nRBC: 0 % (ref 0.0–0.2)

## 2019-06-21 LAB — SAMPLE TO BLOOD BANK

## 2019-06-21 LAB — BASIC METABOLIC PANEL
Anion gap: 9 (ref 5–15)
BUN: 36 mg/dL — ABNORMAL HIGH (ref 8–23)
CO2: 28 mmol/L (ref 22–32)
Calcium: 9.3 mg/dL (ref 8.9–10.3)
Chloride: 102 mmol/L (ref 98–111)
Creatinine, Ser: 1.13 mg/dL — ABNORMAL HIGH (ref 0.44–1.00)
GFR calc Af Amer: 48 mL/min — ABNORMAL LOW (ref >60)
GFR calc non Af Amer: 41 mL/min — ABNORMAL LOW (ref >60)
Glucose, Bld: 100 mg/dL — ABNORMAL HIGH (ref 70–99)
Potassium: 4.4 mmol/L (ref 3.5–5.1)
Sodium: 139 mmol/L (ref 135–145)

## 2019-06-21 LAB — GLUCOSE, CAPILLARY: Glucose-Capillary: 96 mg/dL (ref 70–99)

## 2019-06-21 MED ORDER — ORAL CARE MOUTH RINSE
15.0000 mL | Freq: Two times a day (BID) | OROMUCOSAL | Status: DC
  Administered 2019-06-21: 23:00:00 15 mL via OROMUCOSAL

## 2019-06-21 MED ORDER — TRAMADOL HCL 50 MG PO TABS
50.0000 mg | ORAL_TABLET | Freq: Four times a day (QID) | ORAL | Status: DC | PRN
  Administered 2019-06-21 – 2019-06-22 (×2): 50 mg via ORAL
  Filled 2019-06-21 (×3): qty 1

## 2019-06-21 NOTE — Consult Note (Signed)
[place] Nurse wound consult note Patient receiving care in [facility].  Consult completed remotely after review of record and phone conversation with primary RN, Debbie.  Photos not available. Reason for Consult: skin tears to right forehead and right posterior shoulder Wound type: trauma Wound bed: edematous, flaps present Periwound: very fragile Dressing procedure/placement/frequency:  Moisten the wounds and the skin flaps of the skin tears on the right forehead and right posterior shoulder with saline.  DO NOT pat dry. Using cotton tipped applicators, push the skin flaps back over the wounds.  Then place a vaseline gauze over the wound then a foam dressing.  Change every other day. Monitor the wound area(s) for worsening of condition such as: Signs/symptoms of infection,  Increase in size,  Development of or worsening of odor, Development of pain, or increased pain at the affected locations.  Notify the medical team if any of these develop.  Thank you for the consult.  Discussed plan of care with the bedside nurse.  [place] nurse will not follow at this time.  Please re-consult the [place] team if needed.  Val Riles, RN, MSN, CWOCN, CNS-BC, pager [phone]

## 2019-06-21 NOTE — ED Notes (Signed)
Patient has wounds on head wrapped, dressing is intact. Patient was alert upon arrival, but confused.  Patient was oriented to where she was. Purewick is in place and to low continuous suction. Fluids infusing at rate set by previous RN to gravity. IV site WNL. Room is darkened and door is open for safety. Patient is by nurse's desk.

## 2019-06-21 NOTE — NC FL2 (Signed)
[place] LEVEL OF CARE SCREENING TOOL     IDENTIFICATION  Patient Name: Kayla Galloway Birthdate: 22-Nov-1920 Sex: female Admission Date (Current Location): 06/20/2019  [place] and [place] Number:  Engineering geologist and Address:  [facility], [address], [place], [place] [address]      Provider Number: [id number]  Attending Physician Name and Address:  Max Sane, MD  Relative Name and Phone Number:       Current Level of Care: Hospital Recommended Level of Care: [place] Prior Approval Number:    Date Approved/Denied:   PASRR Number: [id number]  Discharge Plan: SNF    Current Diagnoses: Patient Active Problem List   Diagnosis Date Noted  . CHF (congestive heart failure) ([place]) 06/21/2019  . Closed fracture of right distal radius 06/21/2019  . Syncope 06/20/2019  . Depression   . CAD (coronary artery disease)   . Pulmonary emphysema ([place])   . AKI (acute kidney injury) ([place])   . Encounter for completion of form with patient 09/26/2018  . Hypoxia 07/21/2018  . Abnormal CXR 07/21/2018  . PAD (peripheral artery disease) ([place]) 04/27/2018  . Fracture of oth parts of pelvis, init for clos fx ([place]) 03/22/2018  . Back pain 03/25/2017  . Hyponatremia 02/09/2017  . Rib fractures 02/04/2017  . GERD (gastroesophageal reflux disease) 02/04/2017  . Skin lesions 11/09/2016  . Bilateral lower extremity edema 11/04/2016  . S/P IVC filter 06/24/2016  . Weight loss 04/11/2014  . Anemia 06/28/2012  . Hypertension 06/28/2012  . Hypercholesterolemia 06/28/2012  . Renal insufficiency 06/28/2012  . Osteoporosis 06/28/2012    Orientation RESPIRATION BLADDER Height & Weight     Self, Place, Situation  Normal Continent Weight: 54.4 kg Height:  5' (152.4 cm)  BEHAVIORAL SYMPTOMS/MOOD NEUROLOGICAL BOWEL NUTRITION STATUS      Continent Diet(heart healthy)  AMBULATORY STATUS COMMUNICATION OF NEEDS Skin   Extensive  Assist Verbally Bruising, Skin abrasions(bruising and hematoma right face)                       Personal Care Assistance Level of Assistance  Bathing, Feeding, Dressing Bathing Assistance: Maximum assistance Feeding assistance: Limited assistance Dressing Assistance: Maximum assistance     Functional Limitations Info             SPECIAL CARE FACTORS FREQUENCY                       Contractures Contractures Info: Not present    Additional Factors Info  Code Status, Allergies Code Status Info: DNR Allergies Info: Cefuroxime, doxycycline, advil, celebrex, daypro, etodolac, m,acrobid, pcn, percocet, tramadol, valium, neosporin, vicodin           Current Medications (06/21/2019):  This is the current hospital active medication list Current Facility-Administered Medications  Medication Dose Route Frequency Provider Last Rate Last Dose  . acetaminophen (TYLENOL) tablet 650 mg  650 mg Oral Q6H PRN Lenore Cordia, MD       Or  . acetaminophen (TYLENOL) suppository 650 mg  650 mg Rectal Q6H PRN Zada Finders R, MD      . HYDROmorphone (DILAUDID) injection 0.5 mg  0.5 mg Intravenous Q4H PRN Zada Finders R, MD   0.5 mg at 06/21/19 [id number]  . isosorbide dinitrate (ISORDIL) tablet 10 mg  10 mg Oral BID Lenore Cordia, MD      . MEDLINE mouth rinse  15 mL Mouth Rinse BID Manuella Ghazi,  Vipul, MD      . Melatonin TABS 5 mg  5 mg Oral QHS Lenore Cordia, MD      . multivitamin with minerals tablet   Oral Daily Lenore Cordia, MD   1 tablet at 06/21/19 1218  . senna-docusate (Senokot-S) tablet 1 tablet  1 tablet Oral Daily Lenore Cordia, MD   1 tablet at 06/21/19 1218  . sertraline (ZOLOFT) tablet 50 mg  50 mg Oral Daily Lenore Cordia, MD   50 mg at 06/21/19 1218  . sodium chloride flush (NS) 0.9 % injection 3 mL  3 mL Intravenous Q12H Patel, Vishal R, MD      . traMADol (ULTRAM) tablet 50 mg  50 mg Oral Q6H PRN Max Sane, MD   50 mg at 06/21/19 1351  . tranexamic acid  (CYKLOKAPRON) injection 500 mg  500 mg Topical Once Lilia Pro., MD         Discharge Medications: Please see discharge summary for a list of discharge medications.  Relevant Imaging Results:  Relevant Lab Results:   Additional Information SS# [id number]  Shelbie Hutching, RN

## 2019-06-21 NOTE — ED Notes (Signed)
Report given to Sonjia, RN 

## 2019-06-21 NOTE — ED Notes (Signed)
Pt informed this RN that she could not urinate even though she felt like she had to. Pt consented to I&O. This RN and Kadijah, NT successfully completed I&O for pt. 627ml clear urine returned. Pt cleaned up and repositioned in bed. No further needs addressed. Will continue to monitor.

## 2019-06-21 NOTE — ED Notes (Signed)
Pt transported to room 110 

## 2019-06-21 NOTE — Progress Notes (Signed)
Patient's nephew Elta Guadeloupe updated on room number and password per patient's request.

## 2019-06-21 NOTE — Evaluation (Signed)
Physical Therapy Evaluation Patient Details Name: [name] MRN: [id number] DOB: [birth year] Today's Date: 06/21/2019   History of Present Illness  Kayla Galloway is a 85 y.o. female who presented from [facility] after a syncopal episode when walking out of her bathroom and subsequent fall onto her right side. Pt was found to have a right distal radius fracture, right shoulder abrasion, as well as a right-sided hematoma of the head. PMH includes:  pulmonary emphysema/chronic respiratory failure on supplemental O2, CAD, hypertension, hyperlipidemia, history of DVT s/p IVC filter, and depression.  Clinical Impression  Pt is a 85 year old female who ambulates with a RW at baseline.  She is in bed with visible bruising and swelling of R side of face due to impact of fall.  She presented with overall weakness of UE/LE's.  Pt reported high pain level of R UE and was apologetic throughout evaluation saying, "I'm sorry I can't do more".  She was able to slowly come to EOB with mod A and sit with good balance.  PT provided Min A and education regarding use of platform walker for STS and pt was able to stand, though she maintained a posterior lean against bed except when asked to lean forward.  Pt fearful of falling and reporting pain increase.  She was able to stand to tolerate BP monitoring for ~5 min and then requested to return to bed.   Pt very amenable and alert and oriented during evaluation.  She will continue to benefit from skilled PT with focus on strength, pain management, use of AD, balance and safe functional mobility.    Follow Up Recommendations SNF    Equipment Recommendations  (Pediatric platform walker.)    Recommendations for Other Services       Precautions / Restrictions Precautions Precautions: Fall Precaution Comments: High fall Required Braces or Orthoses: Splint/Cast Splint/Cast: RUE distal radius fracture with splint immobilization Restrictions Weight Bearing  Restrictions: No Other Position/Activity Restrictions: RUE treated as NWB through therapy session, but no weight bearing restrictions currently listed in chart.      Mobility  Bed Mobility Overal bed mobility: Needs Assistance Bed Mobility: Supine to Sit     Supine to sit: Mod assist     General bed mobility comments: assistance to bring trunk upright, pt able to bring feet over EOB.  Transfers Overall transfer level: Needs assistance Equipment used: Rolling walker (2 wheeled) Transfers: Sit to/from Stand Sit to Stand: From elevated surface;Min assist         General transfer comment: Very slow to stand, visibly in pain.  R UE resting on platform walker and L UE pushing on bed.  Ambulation/Gait                Stairs            Wheelchair Mobility    Modified Rankin (Stroke Patients Only)       Balance Overall balance assessment: Needs assistance Sitting-balance support: Feet supported;No upper extremity supported Sitting balance-Leahy Scale: Good     Standing balance support: Single extremity supported Standing balance-Leahy Scale: Fair                               Pertinent Vitals/Pain Pain Assessment: Faces Faces Pain Scale: Hurts even more Pain Location: R shoulder, wrist.  "This just hurts so bad I don't know what to do", holding up UE. Pain Descriptors / Indicators: Grimacing;Guarding Pain  Intervention(s): Limited activity within patient's tolerance;Monitored during session;Patient requesting pain meds-RN notified;Repositioned    Home Living Family/patient expects to be discharged to:: Skilled nursing facility     Type of Home: Assisted living Home Access: Other (comment)(Replied "no" when asked if she has stairs to navigate.)       Home Equipment: Walker - 2 wheels;Walker - 4 wheels;Walker - standard;Bedside commode Additional Comments: Pt is a long term resident at [facility]    Prior Function Level of  Independence: Needs assistance   Gait / Transfers Assistance Needed: Uses a RW for all mobility  ADL's / Homemaking Assistance Needed: Pt states "I take all the help I can get" when asked about baseline performance for ADL mgt. She states she typically dresses herself, but otherwise does not specifiy. Per chart, pt falls frequently. Household ambulator only.        Hand Dominance   Dominant Hand: Left    Extremity/Trunk Assessment   Upper Extremity Assessment Upper Extremity Assessment: Generalized weakness RUE Deficits / Details: s/p distal radial fracture with splint/immobilization in place. RUE: Unable to fully assess due to pain;Unable to fully assess due to immobilization RUE Coordination: decreased fine motor;decreased gross motor LUE Deficits / Details: Grossly WFL.    Lower Extremity Assessment Lower Extremity Assessment: Generalized weakness    Cervical / Trunk Assessment Cervical / Trunk Assessment: Kyphotic  Communication   Communication: No difficulties  Cognition Arousal/Alertness: Awake/alert Behavior During Therapy: WFL for tasks assessed/performed Overall Cognitive Status: Within Functional Limits for tasks assessed                                 General Comments: Pt alert and oriented but perseverating on pain.  Very apologetic that she could not "do more".      General Comments      Exercises Other Exercises Other Exercises: Time to monitor vitals and educate about use of platform walker.  x5 min Other Exercises: Bed mobility and WB education related to R NWB.  x3 min   Assessment/Plan    PT Assessment Patient needs continued PT services  PT Problem List Decreased strength;Decreased mobility;Decreased range of motion;Decreased activity tolerance;Decreased balance;Decreased knowledge of use of DME;Pain       PT Treatment Interventions DME instruction;Therapeutic activities;Gait training;Therapeutic exercise;Patient/family  education;Balance training;Functional mobility training    PT Goals (Current goals can be found in the Care Plan section)  Acute Rehab PT Goals Patient Stated Goal: To have less pain and feel better PT Goal Formulation: With patient Time For Goal Achievement: 07/05/19 Potential to Achieve Goals: Good    Frequency 7X/week   Barriers to discharge        Co-evaluation               AM-PAC PT "6 Clicks" Mobility  Outcome Measure Help needed turning from your back to your side while in a flat bed without using bedrails?: A Little Help needed moving from lying on your back to sitting on the side of a flat bed without using bedrails?: A Little Help needed moving to and from a bed to a chair (including a wheelchair)?: A Lot Help needed standing up from a chair using your arms (e.g., wheelchair or bedside chair)?: A Lot Help needed to walk in hospital room?: A Lot Help needed climbing 3-5 steps with a railing? : A Lot 6 Click Score: 14    End of Session Equipment Utilized During  Treatment: Gait belt;Other (comment)(R wrist splint.) Activity Tolerance: Patient limited by pain Patient left: in bed;with bed alarm set;with call bell/phone within reach;with nursing/sitter in room Nurse Communication: Mobility status(RN in room.) PT Visit Diagnosis: Unsteadiness on feet (R26.81);Muscle weakness (generalized) (M62.81);History of falling (Z91.81);Pain Pain - Right/Left: Right Pain - part of body: Arm;Hand    Time: [id number] PT Time Calculation (min) (ACUTE ONLY): 19 min   Charges:   PT Evaluation $PT Eval Moderate Complexity: 1 Mod PT Treatments $Therapeutic Activity: 8-22 mins        Roxanne Gates, PT, DPT   Roxanne Gates 06/21/2019, 4:20 PM

## 2019-06-21 NOTE — ED Notes (Signed)
Called tech to help with bathing patient to remove dried blood. Patient has gone back to sleep. Will bundle care when patient wakes up naturally. Patient is on dynamap monitor.

## 2019-06-21 NOTE — Progress Notes (Signed)
PT Cancellation Note  Patient Details Name: NASHANDA CURTRIGHT MRN: [id number] DOB: [birth year]   Cancelled Treatment:    Reason Eval/Treat Not Completed: Patient at procedure or test/unavailable.  Order received and chart reviewed.  Nursing in room with pt.  Will check back when pt is available.  Roxanne Gates, PT, DPT  Roxanne Gates 06/21/2019, 12:13 PM

## 2019-06-21 NOTE — Progress Notes (Signed)
PT Cancellation Note  Patient Details Name: Kayla Galloway MRN: [id number] DOB: [birth year]   Cancelled Treatment:    Reason Eval/Treat Not Completed: Patient declined, no reason specified.  Pt resting in bed, stated that she would like to wait until after lunch to work with therapy.  Will return shortly.  Roxanne Gates, PT, DPT  Roxanne Gates 06/21/2019, 12:13 PM

## 2019-06-21 NOTE — Consult Note (Signed)
[place]  [phone] [phone]   Name: Kayla Galloway Date: 06/21/2019 MRN: [id number]  DOB: 07/17/1921  Patient Care Team: [name], MD as PCP - General (Unknown Physician Specialty)    REASON FOR CONSULTATION: Palliative Care consult requested for this 85 y.o. female with multiple medical problems including mild dementia, COPD on supplemental O2, CAD, hypertension, hyperlipidemia, history of DVT status post IVC filter who was admitted to the hospital 06/20/2019 for work-up after a syncopal event.  Reportedly she fell while trying to ambulate to the bathroom at her nursing facility.  Patient was found to have a right wrist fracture and facial contusions/hematoma.  Palliative care was consulted to help address goals.  SOCIAL HISTORY:     reports that she has never smoked. She has never used smokeless tobacco. She reports that she does not drink alcohol or use drugs.   Patient is widowed.  She lives at [facility] since March 2020.  She has no children.  Patient has a nephew who is her Marine scientist.  ADVANCE DIRECTIVES:  On file  CODE STATUS: DNR  PAST MEDICAL HISTORY: Past Medical History:  Diagnosis Date  . Anemia   . Arthritis   . Cancer ([place])    skin ca lesion of scalp- removed  . Depression   . DVT (deep venous thrombosis), left    bilateral, IVC filter 2010  . GERD (gastroesophageal reflux disease)   . Gout   . Hypercholesterolemia   . Hyperkalemia   . Hypertension    Dr. [name]  . Nephrolithiasis   . Obesity   . Osteoporosis   . Peripheral vascular disease ([place])   . 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 Spider veins     PAST SURGICAL HISTORY:  Past Surgical History:  Procedure Laterality Date  . CARDIAC CATHETERIZATION     2010 Does not see a cardiac  doctor   . Colonsopy    . DENTAL SURGERY    . ECTOPIC PREGNANCY SURGERY    . EYE SURGERY Bilateral 2011,2012   cataracts  . FRACTURE SURGERY  2009   hip, rod from knee to hip  . HEMORRHOID SURGERY    . IVC filter Left    pt states it has turned side ways and could not be removed.  Marland Kitchen KYPHOPLASTY  09/03/2012   per patient, she has had kypho x 2:  Surgeon: Winfield Cunas, MD;  Location: [facility];  Service: Neurosurgery;  Laterality: N/A;  Thoracic eight Kyphoplasty  . OPEN REDUCTION INTERNAL FIXATION (ORIF) DISTAL RADIAL FRACTURE Left 09/06/2015   Procedure: OPEN REDUCTION INTERNAL FIXATION (ORIF) DISTAL RADIAL FRACTURE;  Surgeon: Hessie Knows, MD;  Location: [facility];  Service: Orthopedics;  Laterality: Left;  . RIGHT OOPHORECTOMY     partial-  . SKIN CANCER EXCISION     top of head  . TONSILLECTOMY     age 53  . VERTEBROPLASTY  12/31/2011   Procedure: VERTEBROPLASTY;  Surgeon: Winfield Cunas, MD;  Location: [facility];  Service: Neurosurgery;  Laterality: N/A;  Thoracic eleven vertebroplasty    HEMATOLOGY/ONCOLOGY HISTORY:  Oncology History   No history exists.    ALLERGIES:  is allergic to cefuroxime axetil; doxycycline; advil [ibuprofen]; celebrex [celecoxib]; daypro [oxaprozin]; etodolac; macrobid [nitrofurantoin monohyd macro]; penicillins; percocet [oxycodone-acetaminophen]; tramadol; valium [diazepam]; neosporin [neomycin-bacitracin zn-polymyx];  and vicodin [hydrocodone-acetaminophen].  MEDICATIONS:  Current Facility-Administered Medications  Medication Dose Route Frequency Provider Last Rate Last Dose  . acetaminophen (TYLENOL) tablet 650 mg  650 mg Oral Q6H PRN Lenore Cordia, MD       Or  . acetaminophen (TYLENOL) suppository 650 mg  650 mg Rectal Q6H PRN Zada Finders R, MD      . HYDROmorphone (DILAUDID) injection 0.5 mg  0.5 mg Intravenous Q4H PRN Zada Finders R, MD   0.5 mg at 06/21/19 [id number]  . isosorbide dinitrate (ISORDIL) tablet 10 mg  10 mg Oral BID Zada Finders R,  MD      . MEDLINE mouth rinse  15 mL Mouth Rinse BID Max Sane, MD      . Melatonin TABS 5 mg  5 mg Oral QHS Lenore Cordia, MD      . multivitamin with minerals tablet   Oral Daily Lenore Cordia, MD   1 tablet at 06/21/19 1218  . senna-docusate (Senokot-S) tablet 1 tablet  1 tablet Oral Daily Lenore Cordia, MD   1 tablet at 06/21/19 1218  . sertraline (ZOLOFT) tablet 50 mg  50 mg Oral Daily Lenore Cordia, MD   50 mg at 06/21/19 1218  . sodium chloride flush (NS) 0.9 % injection 3 mL  3 mL Intravenous Q12H Patel, Vishal R, MD      . traMADol (ULTRAM) tablet 50 mg  50 mg Oral Q6H PRN Max Sane, MD   50 mg at 06/21/19 1351  . tranexamic acid (CYKLOKAPRON) injection 500 mg  500 mg Topical Once Lilia Pro., MD        VITAL SIGNS: BP (!) 112/55 (BP Location: Left Arm)   Pulse 65   Temp 98 F (36.7 C) (Oral)   Resp 17   Ht 5' (1.524 m)   Wt 120 lb (54.4 kg)   SpO2 100%   BMI 23.44 kg/m  Filed Weights   06/20/19 1802  Weight: 120 lb (54.4 kg)    Estimated body mass index is 23.44 kg/m as calculated from the following:   Height as of this encounter: 5' (1.524 m).   Weight as of this encounter: 120 lb (54.4 kg).  LABS: CBC:    Component Value Date/Time   WBC 5.9 06/21/2019 0556   HGB 9.1 (L) 06/21/2019 0556   HGB 9.8 (L) 08/06/2012 0425   HCT 28.3 (L) 06/21/2019 0556   HCT 28.2 (L) 08/06/2012 0425   PLT 109 (L) 06/21/2019 0556   PLT 134 (L) 08/06/2012 0425   MCV 100.0 06/21/2019 0556   MCV 97 08/06/2012 0425   NEUTROABS 2.6 06/20/2019 1821   NEUTROABS 4.5 08/06/2012 0425   LYMPHSABS 2.2 06/20/2019 1821   LYMPHSABS 1.5 08/06/2012 0425   MONOABS 0.6 06/20/2019 1821   MONOABS 0.5 08/06/2012 0425   EOSABS 0.1 06/20/2019 1821   EOSABS 0.1 08/06/2012 0425   BASOSABS 0.0 06/20/2019 1821   BASOSABS 0.0 08/06/2012 0425   Comprehensive Metabolic Panel:    Component Value Date/Time   NA 139 06/21/2019 0556   NA 131 (L) 08/06/2012 0425   K 4.4 06/21/2019 0556    K 4.3 08/04/2012 1650   CL 102 06/21/2019 0556   CL 96 (L) 08/04/2012 1650   CO2 28 06/21/2019 0556   CO2 25 08/04/2012 1650   BUN 36 (H) 06/21/2019 0556   BUN 16 08/04/2012 1650   CREATININE 1.13 (H) 06/21/2019 0556   CREATININE 1.28 08/04/2012 1650  GLUCOSE 100 (H) 06/21/2019 0556   GLUCOSE 90 08/04/2012 1650   CALCIUM 9.3 06/21/2019 0556   CALCIUM 9.0 08/04/2012 1650   AST 20 06/20/2019 1821   AST 29 08/04/2012 1650   ALT 11 06/20/2019 1821   ALT 22 08/04/2012 1650   ALKPHOS 35 (L) 06/20/2019 1821   ALKPHOS 67 08/04/2012 1650   BILITOT 0.5 06/20/2019 1821   BILITOT 0.5 08/04/2012 1650   PROT 6.3 (L) 06/20/2019 1821   PROT 6.8 08/04/2012 1650   ALBUMIN 3.7 06/20/2019 1821   ALBUMIN 3.6 08/04/2012 1650    RADIOGRAPHIC STUDIES: Dg Shoulder Right  Result Date: 06/20/2019 CLINICAL DATA:  Fall. Right shoulder bruising. EXAM: RIGHT SHOULDER - 2+ VIEW COMPARISON:  Two-view chest x-ray 03/25/2017 FINDINGS: The right shoulder is located. Acromial spur is noted. No acute fracture is present. Chronic lung changes are stable. No acute fractures are present. IMPRESSION: 1. No acute abnormality or significant interval change. 2. Acromial spur. 3. Stable chronic lung disease. Electronically Signed   By: San Morelle M.D.   On: 06/20/2019 19:02   Dg Wrist Complete Right  Result Date: 06/20/2019 CLINICAL DATA:  Fall.  Right wrist deformity. EXAM: RIGHT WRIST - COMPLETE 3+ VIEW COMPARISON:  None. FINDINGS: Distal radial fracture demonstrates impaction. There is probable intra-articular extension. No significant angulation is present. Carpal bones are intact. Ulna is unremarkable. Advanced degenerative changes are noted at the first [facility] joint with significant radial subluxation. IMPRESSION: 1. Impacted fracture of the distal radius with probable intra-articular extension. 2. Advanced degenerative changes. Electronically Signed   By: San Morelle M.D.   On: 06/20/2019 19:04   Ct  Head Wo Contrast  Result Date: 06/20/2019 CLINICAL DATA:  Syncopal episode hit head on floor laceration to right forehead EXAM: CT HEAD WITHOUT CONTRAST CT MAXILLOFACIAL WITHOUT CONTRAST CT CERVICAL SPINE WITHOUT CONTRAST TECHNIQUE: Multidetector CT imaging of the head, cervical spine, and maxillofacial structures were performed using the standard protocol without intravenous contrast. Multiplanar CT image reconstructions of the cervical spine and maxillofacial structures were also generated. COMPARISON:  CT 02/04/2017 FINDINGS: CT HEAD FINDINGS Brain: no acute territorial infarction, hemorrhage or intracranial mass. Atrophy and moderate small vessel ischemic changes of the white matter. Stable ventricle size. Vascular: No hyperdense vessels.  Carotid vascular calcification Skull: Normal. Negative for fracture or focal lesion. Other: Moderate to large right forehead hematoma and laceration CT MAXILLOFACIAL FINDINGS Osseous: No mandibular fracture. Pterygoid plates and zygomatic arches are intact. Mastoid air cells are clear. No nasal bone fracture Orbits: Negative. No traumatic or inflammatory finding. Sinuses: Moderate circumferential thickening in the left maxillary sinus with scattered calcification and hyperdense secretions. No acute sinus wall fracture. Bony wall thickening of left maxillary sinus consistent with chronic sinusitis. Soft tissues: Large right forehead soft tissue hematoma. Moderate right periorbital soft tissue swelling. Moderate right premalar soft tissue stranding CT CERVICAL SPINE FINDINGS Alignment: Stable 3.5 mm anterolisthesis C3 on C4. Stable 2 mm retrolisthesis C3 on C4. Stable 2 mm retrolisthesis C5 on C6. Trace retrolisthesis C6 on C7. Facet alignment within normal limits. Skull base and vertebrae: No acute fracture. No primary bone lesion or focal pathologic process. Soft tissues and spinal canal: No prevertebral fluid or swelling. No visible canal hematoma. Disc levels: Prominent  calcification and pannus at C1-C2 articulation. Advanced degenerative changes C3 through C7. Multiple level facet degenerative change and multiple level foraminal stenosis. Upper chest: Apical fibrosis. Other: None IMPRESSION: 1. No CT evidence for acute intracranial abnormality. Atrophy and small vessel ischemic  changes of the white matter. 2. No acute facial bone fracture 3. Stable alignment of cervical spine with degenerative changes. No acute fracture 4. Moderate to large right forehead, periorbital and premalar soft tissue hematoma Electronically Signed   By: Donavan Foil M.D.   On: 06/20/2019 19:30   Ct Cervical Spine Wo Contrast  Result Date: 06/20/2019 CLINICAL DATA:  Syncopal episode hit head on floor laceration to right forehead EXAM: CT HEAD WITHOUT CONTRAST CT MAXILLOFACIAL WITHOUT CONTRAST CT CERVICAL SPINE WITHOUT CONTRAST TECHNIQUE: Multidetector CT imaging of the head, cervical spine, and maxillofacial structures were performed using the standard protocol without intravenous contrast. Multiplanar CT image reconstructions of the cervical spine and maxillofacial structures were also generated. COMPARISON:  CT 02/04/2017 FINDINGS: CT HEAD FINDINGS Brain: no acute territorial infarction, hemorrhage or intracranial mass. Atrophy and moderate small vessel ischemic changes of the white matter. Stable ventricle size. Vascular: No hyperdense vessels.  Carotid vascular calcification Skull: Normal. Negative for fracture or focal lesion. Other: Moderate to large right forehead hematoma and laceration CT MAXILLOFACIAL FINDINGS Osseous: No mandibular fracture. Pterygoid plates and zygomatic arches are intact. Mastoid air cells are clear. No nasal bone fracture Orbits: Negative. No traumatic or inflammatory finding. Sinuses: Moderate circumferential thickening in the left maxillary sinus with scattered calcification and hyperdense secretions. No acute sinus wall fracture. Bony wall thickening of left maxillary  sinus consistent with chronic sinusitis. Soft tissues: Large right forehead soft tissue hematoma. Moderate right periorbital soft tissue swelling. Moderate right premalar soft tissue stranding CT CERVICAL SPINE FINDINGS Alignment: Stable 3.5 mm anterolisthesis C3 on C4. Stable 2 mm retrolisthesis C3 on C4. Stable 2 mm retrolisthesis C5 on C6. Trace retrolisthesis C6 on C7. Facet alignment within normal limits. Skull base and vertebrae: No acute fracture. No primary bone lesion or focal pathologic process. Soft tissues and spinal canal: No prevertebral fluid or swelling. No visible canal hematoma. Disc levels: Prominent calcification and pannus at C1-C2 articulation. Advanced degenerative changes C3 through C7. Multiple level facet degenerative change and multiple level foraminal stenosis. Upper chest: Apical fibrosis. Other: None IMPRESSION: 1. No CT evidence for acute intracranial abnormality. Atrophy and small vessel ischemic changes of the white matter. 2. No acute facial bone fracture 3. Stable alignment of cervical spine with degenerative changes. No acute fracture 4. Moderate to large right forehead, periorbital and premalar soft tissue hematoma Electronically Signed   By: Donavan Foil M.D.   On: 06/20/2019 19:30   Ct Maxillofacial Wo Cm  Result Date: 06/20/2019 CLINICAL DATA:  Syncopal episode hit head on floor laceration to right forehead EXAM: CT HEAD WITHOUT CONTRAST CT MAXILLOFACIAL WITHOUT CONTRAST CT CERVICAL SPINE WITHOUT CONTRAST TECHNIQUE: Multidetector CT imaging of the head, cervical spine, and maxillofacial structures were performed using the standard protocol without intravenous contrast. Multiplanar CT image reconstructions of the cervical spine and maxillofacial structures were also generated. COMPARISON:  CT 02/04/2017 FINDINGS: CT HEAD FINDINGS Brain: no acute territorial infarction, hemorrhage or intracranial mass. Atrophy and moderate small vessel ischemic changes of the white matter.  Stable ventricle size. Vascular: No hyperdense vessels.  Carotid vascular calcification Skull: Normal. Negative for fracture or focal lesion. Other: Moderate to large right forehead hematoma and laceration CT MAXILLOFACIAL FINDINGS Osseous: No mandibular fracture. Pterygoid plates and zygomatic arches are intact. Mastoid air cells are clear. No nasal bone fracture Orbits: Negative. No traumatic or inflammatory finding. Sinuses: Moderate circumferential thickening in the left maxillary sinus with scattered calcification and hyperdense secretions. No acute sinus wall fracture. Bony wall  thickening of left maxillary sinus consistent with chronic sinusitis. Soft tissues: Large right forehead soft tissue hematoma. Moderate right periorbital soft tissue swelling. Moderate right premalar soft tissue stranding CT CERVICAL SPINE FINDINGS Alignment: Stable 3.5 mm anterolisthesis C3 on C4. Stable 2 mm retrolisthesis C3 on C4. Stable 2 mm retrolisthesis C5 on C6. Trace retrolisthesis C6 on C7. Facet alignment within normal limits. Skull base and vertebrae: No acute fracture. No primary bone lesion or focal pathologic process. Soft tissues and spinal canal: No prevertebral fluid or swelling. No visible canal hematoma. Disc levels: Prominent calcification and pannus at C1-C2 articulation. Advanced degenerative changes C3 through C7. Multiple level facet degenerative change and multiple level foraminal stenosis. Upper chest: Apical fibrosis. Other: None IMPRESSION: 1. No CT evidence for acute intracranial abnormality. Atrophy and small vessel ischemic changes of the white matter. 2. No acute facial bone fracture 3. Stable alignment of cervical spine with degenerative changes. No acute fracture 4. Moderate to large right forehead, periorbital and premalar soft tissue hematoma Electronically Signed   By: Donavan Foil M.D.   On: 06/20/2019 19:30    PERFORMANCE STATUS (ECOG) : 3 - Symptomatic, >50% confined to bed  Review of  Systems Unless otherwise noted, a complete review of systems is negative.  Physical Exam General: NAD, frail appearing, thin HEENT: Significant facial bruising Pulmonary: Unlabored Extremities: Right wrist in splint Skin: no rashes Neurological: Weakness, oriented to person  IMPRESSION: Patient alert but pleasantly confused.  She has had some pain and discomfort at the site of her right wrist fracture.  She is receiving as needed tramadol and received a dose recently.  Patient says that the pain is "easing off."  I called and spoke with patient's nephew by phone.  Together, we reviewed patient's current medical problems.  He verbalized an understanding that this hospitalization could herald a future decline.  However, he says that patient has done remarkably well in the past and he would not be surprised to see her do well going forward.  Despite that, he is interested in primarily focusing on comfort at the facility.  We discussed hospice involvement.  It is unclear if patient would meet the eligibility criteria at the current time for residential hospice.  Nephew would be interested in hospice following her at the SNF.  PLAN: -Continue current scope of treatment -As needed analgesia.  Continue tramadol for now but would rotate to Norco if ineffective. -CM to arrange hospice services at SNF   Time Total: 60 minutes  Visit consisted of counseling and education dealing with the complex and emotionally intense issues of symptom management and palliative care in the setting of serious and potentially life-threatening illness.Greater than 50%  of this time was spent counseling and coordinating care related to the above assessment and plan.  Signed by: Altha Harm, PhD, NP-C

## 2019-06-21 NOTE — Progress Notes (Signed)
[facility] [place] NAME: Kayla Galloway    MR#:  [id number]  DATE OF BIRTH:  07/17/1921  SUBJECTIVE:  CHIEF COMPLAINT:   Chief Complaint  Patient presents with  . Fall  Bruised all over. moaning REVIEW OF SYSTEMS:  ROS unable to obtain due to her mental status DRUG ALLERGIES:   Allergies  Allergen Reactions  . Cefuroxime Axetil Rash    Pt states that she does fine with Keflex.    . Doxycycline Nausea Only and Other (See Comments)    Reaction:  Weight loss   . Advil [Ibuprofen] Swelling  . Celebrex [Celecoxib] Nausea Only  . Daypro [Oxaprozin] Other (See Comments)    Reaction:  Dizziness   . Etodolac Nausea Only  . Macrobid [employer] Macro] Other (See Comments)    Reaction:  Burning   . Penicillins Other (See Comments)    Reaction:  Burning   . Percocet [Oxycodone-Acetaminophen] Nausea Only and Swelling  . Tramadol Other (See Comments)    Reaction:  Unknown   . Valium [Diazepam] Other (See Comments)    Reaction:  Dizziness    . Neosporin [Neomycin-Bacitracin Zn-Polymyx] Rash  . Vicodin [Hydrocodone-Acetaminophen] Nausea Only   VITALS:  Blood pressure 120/79, pulse 62, temperature 98 F (36.7 C), temperature source Oral, resp. rate 17, height 5' (1.524 m), weight 54.4 kg, SpO2 100 %. PHYSICAL EXAMINATION:  Physical Exam Constitutional:      Appearance: She is cachectic. She is ill-appearing and toxic-appearing.  HENT:     Head: Normocephalic and atraumatic. Right periorbital erythema and left periorbital erythema present.     Comments: Periorbital ecchymosis both sides Eyes:     Conjunctiva/sclera: Conjunctivae normal.     Pupils: Pupils are equal, round, and reactive to light.  Neck:     Musculoskeletal: Normal range of motion and neck supple.     Thyroid: No thyromegaly.     Trachea: No tracheal deviation.  Cardiovascular:     Rate and Rhythm: Normal rate and regular rhythm.     Heart sounds: Normal heart sounds.   Pulmonary:     Effort: Pulmonary effort is normal. No respiratory distress.     Breath sounds: Normal breath sounds. No wheezing.  Chest:     Chest wall: No tenderness.  Abdominal:     General: Bowel sounds are normal. There is no distension.     Palpations: Abdomen is soft.     Tenderness: There is no abdominal tenderness.  Musculoskeletal: Normal range of motion.     Comments: volar splint in place RUE  Skin:    General: Skin is warm and dry.     Findings: No rash.  Neurological:     Mental Status: She is oriented to person, place, and time. She is lethargic.     Cranial Nerves: No cranial nerve deficit.     Comments: Non-focal  Psychiatric:        Mood and Affect: Mood normal.     Comments: Difficult eval due to her mental status    LABORATORY PANEL:  Female CBC Recent Labs  Lab 06/21/19 0556  WBC 5.9  HGB 9.1*  HCT 28.3*  PLT 109*   ------------------------------------------------------------------------------------------------------------------ Chemistries  Recent Labs  Lab 06/20/19 1821 06/21/19 0556  NA 139 139  K 4.4 4.4  CL 101 102  CO2 24 28  GLUCOSE 109* 100*  BUN 43* 36*  CREATININE 1.32* 1.13*  CALCIUM 9.6 9.3  AST 20  --  ALT 11  --   ALKPHOS 35*  --   BILITOT 0.5  --    RADIOLOGY:  Dg Shoulder Right  Result Date: 06/20/2019 CLINICAL DATA:  Fall. Right shoulder bruising. EXAM: RIGHT SHOULDER - 2+ VIEW COMPARISON:  Two-view chest x-ray 03/25/2017 FINDINGS: The right shoulder is located. Acromial spur is noted. No acute fracture is present. Chronic lung changes are stable. No acute fractures are present. IMPRESSION: 1. No acute abnormality or significant interval change. 2. Acromial spur. 3. Stable chronic lung disease. Electronically Signed   By: San Morelle M.D.   On: 06/20/2019 19:02   Dg Wrist Complete Right  Result Date: 06/20/2019 CLINICAL DATA:  Fall.  Right wrist deformity. EXAM: RIGHT WRIST - COMPLETE 3+ VIEW COMPARISON:   None. FINDINGS: Distal radial fracture demonstrates impaction. There is probable intra-articular extension. No significant angulation is present. Carpal bones are intact. Ulna is unremarkable. Advanced degenerative changes are noted at the first [facility] joint with significant radial subluxation. IMPRESSION: 1. Impacted fracture of the distal radius with probable intra-articular extension. 2. Advanced degenerative changes. Electronically Signed   By: San Morelle M.D.   On: 06/20/2019 19:04   Ct Head Wo Contrast  Result Date: 06/20/2019 CLINICAL DATA:  Syncopal episode hit head on floor laceration to right forehead EXAM: CT HEAD WITHOUT CONTRAST CT MAXILLOFACIAL WITHOUT CONTRAST CT CERVICAL SPINE WITHOUT CONTRAST TECHNIQUE: Multidetector CT imaging of the head, cervical spine, and maxillofacial structures were performed using the standard protocol without intravenous contrast. Multiplanar CT image reconstructions of the cervical spine and maxillofacial structures were also generated. COMPARISON:  CT 02/04/2017 FINDINGS: CT HEAD FINDINGS Brain: no acute territorial infarction, hemorrhage or intracranial mass. Atrophy and moderate small vessel ischemic changes of the white matter. Stable ventricle size. Vascular: No hyperdense vessels.  Carotid vascular calcification Skull: Normal. Negative for fracture or focal lesion. Other: Moderate to large right forehead hematoma and laceration CT MAXILLOFACIAL FINDINGS Osseous: No mandibular fracture. Pterygoid plates and zygomatic arches are intact. Mastoid air cells are clear. No nasal bone fracture Orbits: Negative. No traumatic or inflammatory finding. Sinuses: Moderate circumferential thickening in the left maxillary sinus with scattered calcification and hyperdense secretions. No acute sinus wall fracture. Bony wall thickening of left maxillary sinus consistent with chronic sinusitis. Soft tissues: Large right forehead soft tissue hematoma. Moderate right  periorbital soft tissue swelling. Moderate right premalar soft tissue stranding CT CERVICAL SPINE FINDINGS Alignment: Stable 3.5 mm anterolisthesis C3 on C4. Stable 2 mm retrolisthesis C3 on C4. Stable 2 mm retrolisthesis C5 on C6. Trace retrolisthesis C6 on C7. Facet alignment within normal limits. Skull base and vertebrae: No acute fracture. No primary bone lesion or focal pathologic process. Soft tissues and spinal canal: No prevertebral fluid or swelling. No visible canal hematoma. Disc levels: Prominent calcification and pannus at C1-C2 articulation. Advanced degenerative changes C3 through C7. Multiple level facet degenerative change and multiple level foraminal stenosis. Upper chest: Apical fibrosis. Other: None IMPRESSION: 1. No CT evidence for acute intracranial abnormality. Atrophy and small vessel ischemic changes of the white matter. 2. No acute facial bone fracture 3. Stable alignment of cervical spine with degenerative changes. No acute fracture 4. Moderate to large right forehead, periorbital and premalar soft tissue hematoma Electronically Signed   By: Donavan Foil M.D.   On: 06/20/2019 19:30   Ct Cervical Spine Wo Contrast  Result Date: 06/20/2019 CLINICAL DATA:  Syncopal episode hit head on floor laceration to right forehead EXAM: CT HEAD WITHOUT CONTRAST CT MAXILLOFACIAL WITHOUT  CONTRAST CT CERVICAL SPINE WITHOUT CONTRAST TECHNIQUE: Multidetector CT imaging of the head, cervical spine, and maxillofacial structures were performed using the standard protocol without intravenous contrast. Multiplanar CT image reconstructions of the cervical spine and maxillofacial structures were also generated. COMPARISON:  CT 02/04/2017 FINDINGS: CT HEAD FINDINGS Brain: no acute territorial infarction, hemorrhage or intracranial mass. Atrophy and moderate small vessel ischemic changes of the white matter. Stable ventricle size. Vascular: No hyperdense vessels.  Carotid vascular calcification Skull: Normal.  Negative for fracture or focal lesion. Other: Moderate to large right forehead hematoma and laceration CT MAXILLOFACIAL FINDINGS Osseous: No mandibular fracture. Pterygoid plates and zygomatic arches are intact. Mastoid air cells are clear. No nasal bone fracture Orbits: Negative. No traumatic or inflammatory finding. Sinuses: Moderate circumferential thickening in the left maxillary sinus with scattered calcification and hyperdense secretions. No acute sinus wall fracture. Bony wall thickening of left maxillary sinus consistent with chronic sinusitis. Soft tissues: Large right forehead soft tissue hematoma. Moderate right periorbital soft tissue swelling. Moderate right premalar soft tissue stranding CT CERVICAL SPINE FINDINGS Alignment: Stable 3.5 mm anterolisthesis C3 on C4. Stable 2 mm retrolisthesis C3 on C4. Stable 2 mm retrolisthesis C5 on C6. Trace retrolisthesis C6 on C7. Facet alignment within normal limits. Skull base and vertebrae: No acute fracture. No primary bone lesion or focal pathologic process. Soft tissues and spinal canal: No prevertebral fluid or swelling. No visible canal hematoma. Disc levels: Prominent calcification and pannus at C1-C2 articulation. Advanced degenerative changes C3 through C7. Multiple level facet degenerative change and multiple level foraminal stenosis. Upper chest: Apical fibrosis. Other: None IMPRESSION: 1. No CT evidence for acute intracranial abnormality. Atrophy and small vessel ischemic changes of the white matter. 2. No acute facial bone fracture 3. Stable alignment of cervical spine with degenerative changes. No acute fracture 4. Moderate to large right forehead, periorbital and premalar soft tissue hematoma Electronically Signed   By: Donavan Foil M.D.   On: 06/20/2019 19:30   Ct Maxillofacial Wo Cm  Result Date: 06/20/2019 CLINICAL DATA:  Syncopal episode hit head on floor laceration to right forehead EXAM: CT HEAD WITHOUT CONTRAST CT MAXILLOFACIAL WITHOUT  CONTRAST CT CERVICAL SPINE WITHOUT CONTRAST TECHNIQUE: Multidetector CT imaging of the head, cervical spine, and maxillofacial structures were performed using the standard protocol without intravenous contrast. Multiplanar CT image reconstructions of the cervical spine and maxillofacial structures were also generated. COMPARISON:  CT 02/04/2017 FINDINGS: CT HEAD FINDINGS Brain: no acute territorial infarction, hemorrhage or intracranial mass. Atrophy and moderate small vessel ischemic changes of the white matter. Stable ventricle size. Vascular: No hyperdense vessels.  Carotid vascular calcification Skull: Normal. Negative for fracture or focal lesion. Other: Moderate to large right forehead hematoma and laceration CT MAXILLOFACIAL FINDINGS Osseous: No mandibular fracture. Pterygoid plates and zygomatic arches are intact. Mastoid air cells are clear. No nasal bone fracture Orbits: Negative. No traumatic or inflammatory finding. Sinuses: Moderate circumferential thickening in the left maxillary sinus with scattered calcification and hyperdense secretions. No acute sinus wall fracture. Bony wall thickening of left maxillary sinus consistent with chronic sinusitis. Soft tissues: Large right forehead soft tissue hematoma. Moderate right periorbital soft tissue swelling. Moderate right premalar soft tissue stranding CT CERVICAL SPINE FINDINGS Alignment: Stable 3.5 mm anterolisthesis C3 on C4. Stable 2 mm retrolisthesis C3 on C4. Stable 2 mm retrolisthesis C5 on C6. Trace retrolisthesis C6 on C7. Facet alignment within normal limits. Skull base and vertebrae: No acute fracture. No primary bone lesion or focal pathologic process. Soft  tissues and spinal canal: No prevertebral fluid or swelling. No visible canal hematoma. Disc levels: Prominent calcification and pannus at C1-C2 articulation. Advanced degenerative changes C3 through C7. Multiple level facet degenerative change and multiple level foraminal stenosis. Upper  chest: Apical fibrosis. Other: None IMPRESSION: 1. No CT evidence for acute intracranial abnormality. Atrophy and small vessel ischemic changes of the white matter. 2. No acute facial bone fracture 3. Stable alignment of cervical spine with degenerative changes. No acute fracture 4. Moderate to large right forehead, periorbital and premalar soft tissue hematoma Electronically Signed   By: Donavan Foil M.D.   On: 06/20/2019 19:30   ASSESSMENT AND PLAN:  JILLYN ARTUSO is a 85 y.o. female with medical history significant for pulmonary emphysema/chronic respiratory failure on supplemental O2, CAD, hypertension, hyperlipidemia, history of DVT s/p IVC filter, dementia, and depression who is admitted after a syncopal episode with fall and injury.  Syncope: Unclear etiology, possibly cardiogenic given nonspecific IVCD and occasional bigeminy seen on EKG. -Check orthostatic vital signs -Obtain echocardiogram -IVFs  Fall with injury -right distal radius fracture, right shoulder abrasion, right-sided hematoma of the head: Right wrist splinted and wrapped.  CT head/cervical spine/maxillofacial negative for acute fractures or intracranial bleed. - Appreciate Ortho input, conservative mgmt -PT/OT eval  AKI: Mild, suspect prerenal from hypovolemia.  Continue gentle IV fluids overnight and hold home ramipril.  Pulmonary emphysema with chronic respiratory failure: Chronic and stable.  Continue supplemental O2.  CAD: Denies any chest pain.  Troponin negative.  Aspirin on hold given hematoma.  Hypertension: Mildly hypertensive on admission.  Holding ramipril as above.  Will continue Isordil.  Depression: Continue sertraline.  Dementia: At baseline. Continue delirium precautions.   I had long d/w her [employer]. She has very poor prognosis. She is appropriate for Hospice and likely for Hospice Home if qualifies. Palliative care c/s, I've CSW to screen her for Hospice.  All the records  are reviewed and case discussed with Care Management/Social Worker. Management plans discussed with the patient, family (Milon Dikes over Galloway) and they are in agreement.  CODE STATUS: DNR  TOTAL TIME TAKING CARE OF THIS PATIENT: 35 minutes.   More than 50% of the time was spent in counseling/coordination of care: YES  POSSIBLE D/C IN 1-2 DAYS, DEPENDING ON CLINICAL CONDITION.   Max Sane M.D on 06/21/2019 at 12:29 PM  Between 7am to 6pm - Pager - [phone]  After 6pm go to [email or web address] - password [email or web address]  [facility]   CC: Primary care physician; [name], MD  Note: This dictation was prepared with Dragon dictation along with smaller phrase technology. Any transcriptional errors that result from this process are unintentional.

## 2019-06-21 NOTE — TOC Initial Note (Signed)
Transition of Care [facility]) - Initial/Assessment Note    Patient Details  Name: Kayla Galloway MRN: [id number] Date of Birth: [birth year]  Transition [facility]) CM/SW Contact:    Shelbie Hutching, RN Phone Number: 06/21/2019, 2:59 PM  Clinical Narrative:                 Patient admitted after a fall from syncopal episode at [facility].  Patient is a long term resident of [facility].  Patient has a palliative consult and PT pending.  Patient at baseline walks with a walker.    Expected Discharge Plan: [place] Barriers to Discharge: Continued Medical Work up   Patient Goals and CMS Choice        Expected Discharge Plan and Services Expected Discharge Plan: [place]   Discharge Planning Services: CM Consult   Living arrangements for the past 2 months: [place]                                      Prior Living Arrangements/Services Living arrangements for the past 2 months: [place] Lives with:: Facility Resident Patient language and need for interpreter reviewed:: Yes Do you feel safe going back to the place where you live?: Yes      Need for Family Participation in Patient Care: Yes (Comment)(long term resident of [facility]) Care giver support system in place?: Yes (comment)   Criminal Activity/Legal Involvement Pertinent to Current Situation/Hospitalization: No - Comment as needed  Activities of Daily Living Home Assistive Devices/Equipment: Environmental consultant (specify type), Wheelchair, Grab bars in shower, Eyeglasses ADL Screening (condition at time of admission) Patient's cognitive ability adequate to safely complete daily activities?: Yes Is the patient deaf or have difficulty hearing?: Yes Does the patient have difficulty seeing, even when wearing glasses/contacts?: No Does the patient have difficulty concentrating, remembering, or making decisions?: No Patient able to express need for  assistance with ADLs?: Yes Does the patient have difficulty dressing or bathing?: Yes Independently performs ADLs?: No Communication: Independent Dressing (OT): Needs assistance Is this a change from baseline?: Pre-admission baseline Grooming: Needs assistance Is this a change from baseline?: Pre-admission baseline Bathing: Needs assistance Is this a change from baseline?: Change from baseline, expected to last >3 days Toileting: Needs assistance Is this a change from baseline?: Change from baseline, expected to last >3days In/Out Bed: Needs assistance Is this a change from baseline?: Change from baseline, expected to last >3 days Walks in Home: Independent with device (comment) Does the patient have difficulty walking or climbing stairs?: Yes Weakness of Legs: Both Weakness of Arms/Hands: Both  Permission Sought/Granted Permission sought to share information with : Case Manager, Chartered certified accountant granted to share information with : Yes, Verbal Permission Granted     Permission granted to share info w AGENCY: Dietitian granted to share info w Relationship: Marcie Mowers- HCPOA     Emotional Assessment Appearance:: Appears stated age Attitude/Demeanor/Rapport: Engaged Affect (typically observed): Accepting Orientation: : Oriented to Self, Oriented to Place, Oriented to Situation Alcohol / Substance Use: Not Applicable Psych Involvement: No (comment)  Admission diagnosis:  Loss of consciousness ([place]) [R40.20] Hematoma [T14.8XXA] Facial laceration, initial encounter [S01.81XA] Fall, initial encounter [W19.XXXA] Closed fracture of right wrist, initial encounter [S62.101A] Closed fracture of right distal radius [S52.501A] Patient Active Problem List   Diagnosis Date Noted  . CHF (congestive  heart failure) ([place]) 06/21/2019  . Closed fracture of right distal radius 06/21/2019  . Syncope 06/20/2019  . Depression   . CAD (coronary artery  disease)   . Pulmonary emphysema ([place])   . AKI (acute kidney injury) ([place])   . Encounter for completion of form with patient 09/26/2018  . Hypoxia 07/21/2018  . Abnormal CXR 07/21/2018  . PAD (peripheral artery disease) ([place]) 04/27/2018  . Fracture of oth parts of pelvis, init for clos fx ([place]) 03/22/2018  . Back pain 03/25/2017  . Hyponatremia 02/09/2017  . Rib fractures 02/04/2017  . GERD (gastroesophageal reflux disease) 02/04/2017  . Skin lesions 11/09/2016  . Bilateral lower extremity edema 11/04/2016  . S/P IVC filter 06/24/2016  . Weight loss 04/11/2014  . Anemia 06/28/2012  . Hypertension 06/28/2012  . Hypercholesterolemia 06/28/2012  . Renal insufficiency 06/28/2012  . Osteoporosis 06/28/2012   PCP:  [name], MD Pharmacy:   [facility], [address] [place] [address] Phone: [phone] Fax: [place], [place] [facility] [address] [place] [address] [place] [address] Phone: [phone] Fax: [phone]     Social Determinants of Health (SDOH) Interventions    Readmission Risk Interventions No flowsheet data found.

## 2019-06-21 NOTE — Consult Note (Signed)
ORTHOPAEDIC CONSULTATION  PATIENT NAME: Kayla Galloway DOB: [birth year]  MRN: [id number]  REQUESTING PHYSICIAN: Max Sane, MD  Chief Complaint: Right wrist pain  HPI: Kayla Galloway is a 85 y.o. left-hand-dominant female who complains of right wrist pain.  The patient had a syncopal episode yesterday after coming out of the restroom and fell on her right side.  She had complaints of severe right wrist pain and some mild right shoulder pain.  Subsequent radiographs demonstrated a fracture of the right distal radius.  Past Medical History:  Diagnosis Date  . Anemia   . Arthritis   . Cancer ([place])    skin ca lesion of scalp- removed  . Depression   . DVT (deep venous thrombosis), left    bilateral, IVC filter 2010  . GERD (gastroesophageal reflux disease)   . Gout   . Hypercholesterolemia   . Hyperkalemia   . Hypertension    Dr. [name]  . Nephrolithiasis   . Obesity   . Osteoporosis   . Peripheral vascular disease ([place])   . 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 Spider veins    Past Surgical History:  Procedure Laterality Date  . CARDIAC CATHETERIZATION     2010 Does not see a cardiac  doctor  . Colonsopy    . DENTAL SURGERY    . ECTOPIC PREGNANCY SURGERY    . EYE SURGERY Bilateral 2011,2012   cataracts  . FRACTURE SURGERY  2009   hip, rod from knee to hip  . HEMORRHOID SURGERY    . IVC filter Left    pt states it has turned side ways and could not be removed.  Marland Kitchen KYPHOPLASTY  09/03/2012   per patient, she has had kypho x 2:  Surgeon: Winfield Cunas, MD;  Location: [facility];  Service: Neurosurgery;  Laterality: N/A;  Thoracic eight Kyphoplasty  . OPEN REDUCTION INTERNAL FIXATION (ORIF) DISTAL RADIAL FRACTURE Left 09/06/2015   Procedure: OPEN REDUCTION INTERNAL FIXATION (ORIF) DISTAL RADIAL FRACTURE;  Surgeon: Hessie Knows, MD;  Location: [facility];   Service: Orthopedics;  Laterality: Left;  . RIGHT OOPHORECTOMY     partial-  . SKIN CANCER EXCISION     top of head  . TONSILLECTOMY     age 78  . VERTEBROPLASTY  12/31/2011   Procedure: VERTEBROPLASTY;  Surgeon: Winfield Cunas, MD;  Location: [facility];  Service: Neurosurgery;  Laterality: N/A;  Thoracic eleven vertebroplasty   Social History   Socioeconomic History  . Marital status: Widowed    Spouse name: Not on file  . Number of children: 0  . Years of education: Not on file  . Highest education level: Not on file  Occupational History  . Not on file  Social Needs  . Financial resource strain: Not on file  . Food insecurity    Worry: Not on file    Inability: Not on file  . Transportation needs    Medical: Not on file    Non-medical: Not on file  Tobacco Use  . Smoking status: Never Smoker  . Smokeless tobacco: Never Used  Substance and Sexual Activity  . Alcohol use: No    Alcohol/week: 0.0 standard drinks  . Drug use: No  . Sexual activity: Never  Lifestyle  . Physical activity    Days per week: Not on file    Minutes per  session: Not on file  . Stress: Not on file  Relationships  . Social Herbalist on phone: Not on file    Gets together: Not on file    Attends religious service: Not on file    Active member of club or organization: Not on file    Attends meetings of clubs or organizations: Not on file    Relationship status: Not on file  Other Topics Concern  . Not on file  Social History Narrative  . Not on file   Family History  Problem Relation Age of Onset  . Heart disease Father        myocardial infarction  . Heart disease Brother        myocardial infarction  . Lymphoma Sister   . Anesthesia problems Neg Hx   . Breast cancer Neg Hx   . Colon cancer Neg Hx    Allergies  Allergen Reactions  . Cefuroxime Axetil Rash    Pt states that she does fine with Keflex.    . Doxycycline Nausea Only and Other (See Comments)     Reaction:  Weight loss   . Advil [Ibuprofen] Swelling  . Celebrex [Celecoxib] Nausea Only  . Daypro [Oxaprozin] Other (See Comments)    Reaction:  Dizziness   . Etodolac Nausea Only  . Macrobid [employer] Macro] Other (See Comments)    Reaction:  Burning   . Penicillins Other (See Comments)    Reaction:  Burning   . Percocet [Oxycodone-Acetaminophen] Nausea Only and Swelling  . Tramadol Other (See Comments)    Reaction:  Unknown   . Valium [Diazepam] Other (See Comments)    Reaction:  Dizziness    . Neosporin [Neomycin-Bacitracin Zn-Polymyx] Rash  . Vicodin [Hydrocodone-Acetaminophen] Nausea Only   Prior to Admission medications   Medication Sig Start Date End Date Taking? Authorizing Provider  acetaminophen (TYLENOL) 650 MG CR tablet Take 650 mg by mouth every 4 (four) hours as needed for pain. Do not exceed 3 grams of APAP/day from all sources   Yes [provider]  aspirin EC 81 MG tablet Take 81 mg by mouth daily.   Yes [provider]  Calcium 600-200 MG-UNIT tablet Take 1 tablet by mouth 2 (two) times daily.    Yes [provider]  ferrous sulfate 325 (65 FE) MG tablet Take 325 mg by mouth 2 (two) times daily.    Yes [provider]  Glucosamine-Chondroitin 250-200 MG TABS Take 1 tablet by mouth 2 (two) times daily.    Yes [provider]  isosorbide dinitrate (ISORDIL) 10 MG tablet Take 10 mg by mouth 2 (two) times daily.   Yes [provider]  Melatonin 5 MG TABS Take 5 mg by mouth at bedtime.    Yes [provider]  Multiple Vitamins-Minerals (CENTRUM SILVER ADULT 50+ PO) Take 1 tablet by mouth daily.    Yes [provider]  nitroGLYCERIN (NITROSTAT) 0.4 MG SL tablet Place 0.4 mg under the tongue every 5 (five) minutes as needed for chest pain.   Yes [provider]  ondansetron (ZOFRAN) 4 MG tablet Take 4 mg by mouth every 6 (six) hours as needed for nausea or vomiting.   Yes  [provider]  ramipril (ALTACE) 2.5 MG capsule TAKE 1 CAPSULE BY MOUTH ONCE DAILY Patient taking differently: Take 5 mg by mouth daily.  09/10/18  Yes [name], MD  senna-docusate (SENOKOT-S) 8.6-50 MG tablet Take 1 tablet by mouth  daily.   Yes [provider]  sertraline (ZOLOFT) 50 MG tablet TAKE 1 TABLET BY MOUTH ONCE DAILY 10/18/18  Yes [name], MD   Dg Shoulder Right  Result Date: 06/20/2019 CLINICAL DATA:  Fall. Right shoulder bruising. EXAM: RIGHT SHOULDER - 2+ VIEW COMPARISON:  Two-view chest x-ray 03/25/2017 FINDINGS: The right shoulder is located. Acromial spur is noted. No acute fracture is present. Chronic lung changes are stable. No acute fractures are present. IMPRESSION: 1. No acute abnormality or significant interval change. 2. Acromial spur. 3. Stable chronic lung disease. Electronically Signed   By: San Morelle M.D.   On: 06/20/2019 19:02   Dg Wrist Complete Right  Result Date: 06/20/2019 CLINICAL DATA:  Fall.  Right wrist deformity. EXAM: RIGHT WRIST - COMPLETE 3+ VIEW COMPARISON:  None. FINDINGS: Distal radial fracture demonstrates impaction. There is probable intra-articular extension. No significant angulation is present. Carpal bones are intact. Ulna is unremarkable. Advanced degenerative changes are noted at the first [facility] joint with significant radial subluxation. IMPRESSION: 1. Impacted fracture of the distal radius with probable intra-articular extension. 2. Advanced degenerative changes. Electronically Signed   By: San Morelle M.D.   On: 06/20/2019 19:04   Ct Head Wo Contrast  Result Date: 06/20/2019 CLINICAL DATA:  Syncopal episode hit head on floor laceration to right forehead EXAM: CT HEAD WITHOUT CONTRAST CT MAXILLOFACIAL WITHOUT CONTRAST CT CERVICAL SPINE WITHOUT CONTRAST TECHNIQUE: Multidetector CT imaging of the head, cervical spine, and maxillofacial structures were performed using the standard protocol without  intravenous contrast. Multiplanar CT image reconstructions of the cervical spine and maxillofacial structures were also generated. COMPARISON:  CT 02/04/2017 FINDINGS: CT HEAD FINDINGS Brain: no acute territorial infarction, hemorrhage or intracranial mass. Atrophy and moderate small vessel ischemic changes of the white matter. Stable ventricle size. Vascular: No hyperdense vessels.  Carotid vascular calcification Skull: Normal. Negative for fracture or focal lesion. Other: Moderate to large right forehead hematoma and laceration CT MAXILLOFACIAL FINDINGS Osseous: No mandibular fracture. Pterygoid plates and zygomatic arches are intact. Mastoid air cells are clear. No nasal bone fracture Orbits: Negative. No traumatic or inflammatory finding. Sinuses: Moderate circumferential thickening in the left maxillary sinus with scattered calcification and hyperdense secretions. No acute sinus wall fracture. Bony wall thickening of left maxillary sinus consistent with chronic sinusitis. Soft tissues: Large right forehead soft tissue hematoma. Moderate right periorbital soft tissue swelling. Moderate right premalar soft tissue stranding CT CERVICAL SPINE FINDINGS Alignment: Stable 3.5 mm anterolisthesis C3 on C4. Stable 2 mm retrolisthesis C3 on C4. Stable 2 mm retrolisthesis C5 on C6. Trace retrolisthesis C6 on C7. Facet alignment within normal limits. Skull base and vertebrae: No acute fracture. No primary bone lesion or focal pathologic process. Soft tissues and spinal canal: No prevertebral fluid or swelling. No visible canal hematoma. Disc levels: Prominent calcification and pannus at C1-C2 articulation. Advanced degenerative changes C3 through C7. Multiple level facet degenerative change and multiple level foraminal stenosis. Upper chest: Apical fibrosis. Other: None IMPRESSION: 1. No CT evidence for acute intracranial abnormality. Atrophy and small vessel ischemic changes of the white matter. 2. No acute facial bone  fracture 3. Stable alignment of cervical spine with degenerative changes. No acute fracture 4. Moderate to large right forehead, periorbital and premalar soft tissue hematoma Electronically Signed   By: Donavan Foil M.D.   On: 06/20/2019 19:30   Ct Cervical Spine Wo Contrast  Result Date: 06/20/2019 CLINICAL DATA:  Syncopal episode hit head on floor laceration to right forehead EXAM:  CT HEAD WITHOUT CONTRAST CT MAXILLOFACIAL WITHOUT CONTRAST CT CERVICAL SPINE WITHOUT CONTRAST TECHNIQUE: Multidetector CT imaging of the head, cervical spine, and maxillofacial structures were performed using the standard protocol without intravenous contrast. Multiplanar CT image reconstructions of the cervical spine and maxillofacial structures were also generated. COMPARISON:  CT 02/04/2017 FINDINGS: CT HEAD FINDINGS Brain: no acute territorial infarction, hemorrhage or intracranial mass. Atrophy and moderate small vessel ischemic changes of the white matter. Stable ventricle size. Vascular: No hyperdense vessels.  Carotid vascular calcification Skull: Normal. Negative for fracture or focal lesion. Other: Moderate to large right forehead hematoma and laceration CT MAXILLOFACIAL FINDINGS Osseous: No mandibular fracture. Pterygoid plates and zygomatic arches are intact. Mastoid air cells are clear. No nasal bone fracture Orbits: Negative. No traumatic or inflammatory finding. Sinuses: Moderate circumferential thickening in the left maxillary sinus with scattered calcification and hyperdense secretions. No acute sinus wall fracture. Bony wall thickening of left maxillary sinus consistent with chronic sinusitis. Soft tissues: Large right forehead soft tissue hematoma. Moderate right periorbital soft tissue swelling. Moderate right premalar soft tissue stranding CT CERVICAL SPINE FINDINGS Alignment: Stable 3.5 mm anterolisthesis C3 on C4. Stable 2 mm retrolisthesis C3 on C4. Stable 2 mm retrolisthesis C5 on C6. Trace retrolisthesis  C6 on C7. Facet alignment within normal limits. Skull base and vertebrae: No acute fracture. No primary bone lesion or focal pathologic process. Soft tissues and spinal canal: No prevertebral fluid or swelling. No visible canal hematoma. Disc levels: Prominent calcification and pannus at C1-C2 articulation. Advanced degenerative changes C3 through C7. Multiple level facet degenerative change and multiple level foraminal stenosis. Upper chest: Apical fibrosis. Other: None IMPRESSION: 1. No CT evidence for acute intracranial abnormality. Atrophy and small vessel ischemic changes of the white matter. 2. No acute facial bone fracture 3. Stable alignment of cervical spine with degenerative changes. No acute fracture 4. Moderate to large right forehead, periorbital and premalar soft tissue hematoma Electronically Signed   By: Donavan Foil M.D.   On: 06/20/2019 19:30   Ct Maxillofacial Wo Cm  Result Date: 06/20/2019 CLINICAL DATA:  Syncopal episode hit head on floor laceration to right forehead EXAM: CT HEAD WITHOUT CONTRAST CT MAXILLOFACIAL WITHOUT CONTRAST CT CERVICAL SPINE WITHOUT CONTRAST TECHNIQUE: Multidetector CT imaging of the head, cervical spine, and maxillofacial structures were performed using the standard protocol without intravenous contrast. Multiplanar CT image reconstructions of the cervical spine and maxillofacial structures were also generated. COMPARISON:  CT 02/04/2017 FINDINGS: CT HEAD FINDINGS Brain: no acute territorial infarction, hemorrhage or intracranial mass. Atrophy and moderate small vessel ischemic changes of the white matter. Stable ventricle size. Vascular: No hyperdense vessels.  Carotid vascular calcification Skull: Normal. Negative for fracture or focal lesion. Other: Moderate to large right forehead hematoma and laceration CT MAXILLOFACIAL FINDINGS Osseous: No mandibular fracture. Pterygoid plates and zygomatic arches are intact. Mastoid air cells are clear. No nasal bone fracture  Orbits: Negative. No traumatic or inflammatory finding. Sinuses: Moderate circumferential thickening in the left maxillary sinus with scattered calcification and hyperdense secretions. No acute sinus wall fracture. Bony wall thickening of left maxillary sinus consistent with chronic sinusitis. Soft tissues: Large right forehead soft tissue hematoma. Moderate right periorbital soft tissue swelling. Moderate right premalar soft tissue stranding CT CERVICAL SPINE FINDINGS Alignment: Stable 3.5 mm anterolisthesis C3 on C4. Stable 2 mm retrolisthesis C3 on C4. Stable 2 mm retrolisthesis C5 on C6. Trace retrolisthesis C6 on C7. Facet alignment within normal limits. Skull base and vertebrae: No acute fracture. No primary  bone lesion or focal pathologic process. Soft tissues and spinal canal: No prevertebral fluid or swelling. No visible canal hematoma. Disc levels: Prominent calcification and pannus at C1-C2 articulation. Advanced degenerative changes C3 through C7. Multiple level facet degenerative change and multiple level foraminal stenosis. Upper chest: Apical fibrosis. Other: None IMPRESSION: 1. No CT evidence for acute intracranial abnormality. Atrophy and small vessel ischemic changes of the white matter. 2. No acute facial bone fracture 3. Stable alignment of cervical spine with degenerative changes. No acute fracture 4. Moderate to large right forehead, periorbital and premalar soft tissue hematoma Electronically Signed   By: Donavan Foil M.D.   On: 06/20/2019 19:30    Positive ROS: All other systems have been reviewed and were otherwise negative with the exception of those mentioned in the HPI and as above.  Physical Exam: General: Well developed, well nourished female seen in no acute distress. HEENT: Periorbital ecchymosis bilaterally.  Abrasions are also noted.  Sclera are clear. Extraocular motion is intact. Oropharynx is clear with moist mucosa. Neck: Supple, nontender, good range of motion. Lungs:  Clear to auscultation bilaterally. Cardiovascular: Irregular rate and rhythm with normal S1 and S2. No murmurs. No gallops or rubs.  Abdomen: Soft, nontender, and nondistended. Skin: No lesions in the area of chief complaint Neurologic: Awake, alert, and oriented. Sensory function is grossly intact. Motor strength is felt to be 5 over 5 bilaterally. No clonus or tremor. Good motor coordination. Lymphatic: No axillary or cervical lymphadenopathy  MUSCULOSKELETAL: Examination of the right upper extremity demonstrates a volar splint to be in place.  Good capillary refill to the digits.  No significant swelling to the digits.  Good range of motion of the digits is appreciated.  There is tenderness to palpation in the area of the distal radius.  Mild tenderness is noted to palpation about the shoulder.  Assessment: Right distal radius fracture  Plan: The findings were discussed with the patient.  Acceptable alignment is noted on the present films.  I do not appreciate an indication for any surgical intervention at this time.  I would recommend continuation of splint immobilization.  Ice and elevation to the right upper extremity.  The patient will need follow-up in the office in approximately 1 week.  James P. Holley Bouche M.D.

## 2019-06-21 NOTE — ED Notes (Signed)
Pt face and lacerations cleaned by this RN.

## 2019-06-21 NOTE — Evaluation (Signed)
Occupational Therapy Evaluation Patient Details Name: Kayla Galloway MRN: [id number] DOB: [birth year] Today's Date: 06/21/2019    History of Present Illness Kayla Galloway is a 85 y.o. female who presented from [place] after a syncopal episode when walking out of her bathroom and subsequent fall onto her right side. Pt was found to have a right distal radius fracture, right shoulder abrasion, as well as a right-sided hematoma of the head. PMH includes:  pulmonary emphysema/chronic respiratory failure on supplemental O2, CAD, hypertension, hyperlipidemia, history of DVT s/p IVC filter, and depression.   Clinical Impression   Kayla Galloway was seen for OT evaluation this date. Pt received supine in bed sleeping soundly, but wakes easily to VCs. Pt pleasantly confused t/o session, at one point stating "I forgot I'm in the hospital". Pt noted to have significant bruising and lacerations across the R side of her body, shoulder, and head. Prior to hospital admission, pt was a resident at [facility]. Pt is unable to provide clear information about prior level of function 2/2 cognitive status during OT evaluation. Per chart, pt uses a RW for mobility and has a significant falls history. Currently pt demonstrates impairments in cognition, strength, functional mobility, functional UE use and activity tolerance. Pt would benefit from skilled OT to address noted impairments and functional limitations (see below for any additional details) in order to maximize safety and independence while minimizing falls risk and caregiver burden.  Upon hospital discharge, recommend STR to maximize pt safety and return to PLOF.     Follow Up Recommendations  SNF    Equipment Recommendations  3 in 1 bedside commode    Recommendations for Other Services       Precautions / Restrictions Precautions Precautions: Fall Precaution Comments: High fall Required Braces or Orthoses: Splint/Cast Splint/Cast: RUE  distal radius fracture with splint immobilization Restrictions Weight Bearing Restrictions: No Other Position/Activity Restrictions: RUE treated as NWB through therapy session, but no weight bearing restrictions currently listed in chart.      Mobility Bed Mobility Overal bed mobility: Needs Assistance             General bed mobility comments: Deferred 2/2 pt pain and lethargy during session.  Transfers                 General transfer comment: Deferred 2/2 pt pain and lethargy during session.    Balance                                           ADL either performed or assessed with clinical judgement   ADL                                         General ADL Comments: Pt currently requires max assistance for BADL mgt 2/2 pain and decreased functional use of her R (non-dominant) UE. She requires max assist for bed mobility and has a significant posterior lean during attempts at functional transfers with PT this date. Will continue to assess as pain management improves.     Vision Baseline Vision/History: Wears glasses Wears Glasses: Reading only Patient Visual Report: Blurring of vision;Other (comment) Additional Comments: Pt eyes swollen and bruised bilat. She is only able to open them a small amount. She reports her vision is  decreased since the fall but is unable to elaborate.     Perception     Praxis      Pertinent Vitals/Pain Pain Assessment: Faces Faces Pain Scale: Hurts little more Pain Location: R side of her body Pain Descriptors / Indicators: Grimacing;Guarding;Discomfort Pain Intervention(s): Limited activity within patient's tolerance;Monitored during session;Premedicated before session     Hand Dominance Left   Extremity/Trunk Assessment Upper Extremity Assessment Upper Extremity Assessment: LUE deficits/detail;RUE deficits/detail RUE Deficits / Details: s/p distal radial fracture with  spling/immobilization in place. RUE: Unable to fully assess due to pain;Unable to fully assess due to immobilization RUE Coordination: decreased fine motor;decreased gross motor LUE Deficits / Details: Grossly WFL.   Lower Extremity Assessment Lower Extremity Assessment: Defer to PT evaluation;Generalized weakness       Communication Communication Communication: No difficulties   Cognition Arousal/Alertness: Lethargic;Suspect due to medications Behavior During Therapy: [facility] for tasks assessed/performed Overall Cognitive Status: Within Functional Limits for tasks assessed                                 General Comments: Pt oriented to self only at start of session. Initially looking around room as she described birthday presents people got for her and stated "Oh I forgot I'm in the hospital". Is able to state she had a fall and hit her head.   General Comments       Exercises Other Exercises Other Exercises: Education limited 2/2 pt lethargy during session.   Shoulder Instructions      Home Living Family/patient expects to be discharged to:: Skilled nursing facility                                 Additional Comments: Pt is a long term resident at [facility]      Prior Functioning/Environment Level of Independence: Needs assistance  Gait / Transfers Assistance Needed: Uses a RW for all mobility ADL's / Homemaking Assistance Needed: Pt states "I take all the help I can get" when asked about baseline performance for ADL mgt. She states she typically dresses herself, but otherwise does not specifiy. Per chart, pt falls frequently. Household ambulator only.            OT Problem List: Decreased strength;Decreased coordination;Pain;Cardiopulmonary status limiting activity;Decreased range of motion;Decreased activity tolerance;Decreased safety awareness;Impaired balance (sitting and/or standing);Decreased knowledge of use of DME or AE;Impaired UE  functional use;Impaired vision/perception      OT Treatment/Interventions: Self-care/ADL training;Balance training;Therapeutic exercise;Therapeutic activities;Energy conservation;DME and/or AE instruction;Patient/family education    OT Goals(Current goals can be found in the care plan section) Acute Rehab OT Goals Patient Stated Goal: To have less pain and feel better OT Goal Formulation: With patient Time For Goal Achievement: 07/05/19 Potential to Achieve Goals: Good  OT Frequency: Min 2X/week   Barriers to D/C:            Co-evaluation              AM-PAC OT "6 Clicks" Daily Activity     Outcome Measure Help from another person eating meals?: A Little Help from another person taking care of personal grooming?: A Lot Help from another person toileting, which includes using toliet, bedpan, or urinal?: Total Help from another person bathing (including washing, rinsing, drying)?: Total Help from another person to put on and taking off regular upper body clothing?:  A Lot Help from another person to put on and taking off regular lower body clothing?: A Lot 6 Click Score: 11   End of Session Nurse Communication: Other (comment)(Pt would benefit from sling for positioning of RUE)  Activity Tolerance: Patient limited by lethargy;Patient limited by pain Patient left: in bed;with call bell/phone within reach;with bed alarm set;with SCD's reapplied  OT Visit Diagnosis: Other abnormalities of gait and mobility (R26.89);History of falling (Z91.81);Pain Pain - Right/Left: Right Pain - part of body: Shoulder;Arm;Hand;Hip                Time: [id number] OT Time Calculation (min): 16 min Charges:  OT General Charges $OT Visit: 1 Visit OT Evaluation $OT Eval Moderate Complexity: 1 Mod  Shara Blazing, M.S., OTR/L Ascom: [phone] 06/21/19, 3:37 PM

## 2019-06-22 DIAGNOSIS — M5135 Other intervertebral disc degeneration, thoracolumbar region: Secondary | ICD-10-CM | POA: Diagnosis not present

## 2019-06-22 DIAGNOSIS — E871 Hypo-osmolality and hyponatremia: Secondary | ICD-10-CM | POA: Diagnosis not present

## 2019-06-22 DIAGNOSIS — N289 Disorder of kidney and ureter, unspecified: Secondary | ICD-10-CM | POA: Diagnosis not present

## 2019-06-22 DIAGNOSIS — S52501A Unspecified fracture of the lower end of right radius, initial encounter for closed fracture: Secondary | ICD-10-CM | POA: Diagnosis not present

## 2019-06-22 DIAGNOSIS — Z9981 Dependence on supplemental oxygen: Secondary | ICD-10-CM | POA: Diagnosis not present

## 2019-06-22 DIAGNOSIS — M2550 Pain in unspecified joint: Secondary | ICD-10-CM | POA: Diagnosis not present

## 2019-06-22 DIAGNOSIS — M8588 Other specified disorders of bone density and structure, other site: Secondary | ICD-10-CM | POA: Diagnosis not present

## 2019-06-22 DIAGNOSIS — N179 Acute kidney failure, unspecified: Secondary | ICD-10-CM | POA: Diagnosis not present

## 2019-06-22 DIAGNOSIS — D649 Anemia, unspecified: Secondary | ICD-10-CM | POA: Diagnosis not present

## 2019-06-22 DIAGNOSIS — R55 Syncope and collapse: Secondary | ICD-10-CM | POA: Diagnosis not present

## 2019-06-22 DIAGNOSIS — J439 Emphysema, unspecified: Secondary | ICD-10-CM | POA: Diagnosis not present

## 2019-06-22 DIAGNOSIS — R079 Chest pain, unspecified: Secondary | ICD-10-CM | POA: Diagnosis not present

## 2019-06-22 DIAGNOSIS — W19XXXA Unspecified fall, initial encounter: Secondary | ICD-10-CM | POA: Diagnosis not present

## 2019-06-22 DIAGNOSIS — I10 Essential (primary) hypertension: Secondary | ICD-10-CM | POA: Diagnosis not present

## 2019-06-22 DIAGNOSIS — W19XXXD Unspecified fall, subsequent encounter: Secondary | ICD-10-CM | POA: Diagnosis not present

## 2019-06-22 DIAGNOSIS — M109 Gout, unspecified: Secondary | ICD-10-CM | POA: Diagnosis not present

## 2019-06-22 DIAGNOSIS — E039 Hypothyroidism, unspecified: Secondary | ICD-10-CM | POA: Diagnosis not present

## 2019-06-22 DIAGNOSIS — I25118 Atherosclerotic heart disease of native coronary artery with other forms of angina pectoris: Secondary | ICD-10-CM | POA: Diagnosis not present

## 2019-06-22 DIAGNOSIS — E441 Mild protein-calorie malnutrition: Secondary | ICD-10-CM | POA: Diagnosis not present

## 2019-06-22 DIAGNOSIS — I739 Peripheral vascular disease, unspecified: Secondary | ICD-10-CM | POA: Diagnosis not present

## 2019-06-22 DIAGNOSIS — T148XXA Other injury of unspecified body region, initial encounter: Secondary | ICD-10-CM

## 2019-06-22 DIAGNOSIS — Z743 Need for continuous supervision: Secondary | ICD-10-CM | POA: Diagnosis not present

## 2019-06-22 DIAGNOSIS — M25531 Pain in right wrist: Secondary | ICD-10-CM | POA: Diagnosis not present

## 2019-06-22 DIAGNOSIS — Z7401 Bed confinement status: Secondary | ICD-10-CM | POA: Diagnosis not present

## 2019-06-22 DIAGNOSIS — I959 Hypotension, unspecified: Secondary | ICD-10-CM | POA: Diagnosis not present

## 2019-06-22 DIAGNOSIS — J9611 Chronic respiratory failure with hypoxia: Secondary | ICD-10-CM | POA: Diagnosis not present

## 2019-06-22 DIAGNOSIS — J302 Other seasonal allergic rhinitis: Secondary | ICD-10-CM | POA: Diagnosis not present

## 2019-06-22 DIAGNOSIS — E0590 Thyrotoxicosis, unspecified without thyrotoxic crisis or storm: Secondary | ICD-10-CM | POA: Diagnosis not present

## 2019-06-22 DIAGNOSIS — M810 Age-related osteoporosis without current pathological fracture: Secondary | ICD-10-CM | POA: Diagnosis not present

## 2019-06-22 DIAGNOSIS — E7800 Pure hypercholesterolemia, unspecified: Secondary | ICD-10-CM | POA: Diagnosis not present

## 2019-06-22 DIAGNOSIS — M25551 Pain in right hip: Secondary | ICD-10-CM | POA: Diagnosis not present

## 2019-06-22 DIAGNOSIS — S52501D Unspecified fracture of the lower end of right radius, subsequent encounter for closed fracture with routine healing: Secondary | ICD-10-CM | POA: Diagnosis not present

## 2019-06-22 DIAGNOSIS — Z85828 Personal history of other malignant neoplasm of skin: Secondary | ICD-10-CM | POA: Diagnosis not present

## 2019-06-22 DIAGNOSIS — R404 Transient alteration of awareness: Secondary | ICD-10-CM | POA: Diagnosis not present

## 2019-06-22 DIAGNOSIS — M1990 Unspecified osteoarthritis, unspecified site: Secondary | ICD-10-CM | POA: Diagnosis not present

## 2019-06-22 DIAGNOSIS — G4700 Insomnia, unspecified: Secondary | ICD-10-CM | POA: Diagnosis not present

## 2019-06-22 DIAGNOSIS — I25119 Atherosclerotic heart disease of native coronary artery with unspecified angina pectoris: Secondary | ICD-10-CM | POA: Diagnosis not present

## 2019-06-22 DIAGNOSIS — R0902 Hypoxemia: Secondary | ICD-10-CM | POA: Diagnosis not present

## 2019-06-22 DIAGNOSIS — J9610 Chronic respiratory failure, unspecified whether with hypoxia or hypercapnia: Secondary | ICD-10-CM | POA: Diagnosis not present

## 2019-06-22 DIAGNOSIS — N1831 Chronic kidney disease, stage 3a: Secondary | ICD-10-CM

## 2019-06-22 DIAGNOSIS — M40204 Unspecified kyphosis, thoracic region: Secondary | ICD-10-CM | POA: Diagnosis not present

## 2019-06-22 LAB — ECHOCARDIOGRAM COMPLETE
Height: 60 in
Weight: 1920 oz

## 2019-06-22 MED ORDER — TRAMADOL HCL 50 MG PO TABS: 50.0000 mg | ORAL_TABLET | Freq: Four times a day (QID) | ORAL | 0 refills | Status: DC | PRN

## 2019-06-22 NOTE — Progress Notes (Signed)
Pt d/c to [facility] via EMS. IV removed intact. Pt dressings changed on R forehead and R shoulder. All belongings sent with pt.

## 2019-06-22 NOTE — TOC Progression Note (Signed)
Transition of Care [facility]) - Progression Note    Patient Details  Name: Kayla Galloway MRN: [id number] Date of Birth: 02/04/1921  Transition of Care [facility]) CM/SW Contact  Shelbie Hutching, RN Phone Number: 06/22/2019, 10:54 AM  Clinical Narrative:     Patient will discharge back to [place] with [facility].  Patient's [place] offered choice of hospice agency and he chooses [facility].  Referral for Hospice given to Flo Shanks with [facility].   Expected Discharge Plan: [place] Barriers to Discharge: Continued Medical Work up  Expected Discharge Plan and Services Expected Discharge Plan: [place]   Discharge Planning Services: CM Consult   Living arrangements for the past 2 months: [place]                                       Social Determinants of Health (SDOH) Interventions    Readmission Risk Interventions No flowsheet data found.

## 2019-06-22 NOTE — Discharge Summary (Addendum)
[facility] NAME: Kayla Galloway    MR#:  [id number]  DATE OF BIRTH:  Jun 19, 1921  DATE OF ADMISSION:  06/20/2019 ADMITTING PHYSICIAN: Max Sane, MD  DATE OF DISCHARGE: 06/22/2019  PRIMARY CARE PHYSICIAN: [name], MD    ADMISSION DIAGNOSIS:  Loss of consciousness ([place]) [R40.20] Hematoma [T14.8XXA] Facial laceration, initial encounter [S01.81XA] Fall, initial encounter [W19.XXXA] Closed fracture of right wrist, initial encounter [S62.101A] Closed fracture of right distal radius [S52.501A]  DISCHARGE DIAGNOSIS:  *Syncope suspected due to dehydration *close right distal radial fracture *Moderate to large right forehead, periorbital and premalar soft tissue hematoma  *acute kidney injury improving SECONDARY DIAGNOSIS:   Past Medical History:  Diagnosis Date  . Anemia   . Arthritis   . Cancer ([place])    skin ca lesion of scalp- removed  . Depression   . DVT (deep venous thrombosis), left    bilateral, IVC filter 2010  . GERD (gastroesophageal reflux disease)   . Gout   . Hypercholesterolemia   . Hyperkalemia   . Hypertension    Dr. [name]  . Nephrolithiasis   . Obesity   . Osteoporosis   . Peripheral vascular disease ([place])   . 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 Spider veins     HOSPITAL COURSE:   Kayla Galloway a 85 y.o.femalewith medical history significant forpulmonary emphysema/chronic respiratory failure on supplemental O2, CAD, hypertension, hyperlipidemia, history of DVT s/p IVC filter, dementia,and depression whois admitted after a syncopal episode with fall and injury.  Syncope: Unclear etiology, possibly cardiogenic given nonspecific IVCD and occasional bigeminy seen on EKG and dehydration -Echo shows1. Left ventricular ejection fraction, by visual estimation, is 55 to 60%.  The left ventricle has normal function. There is no left ventricular hypertrophy.  2. Global right ventricle has normal systolic function.The right ventricular size is normal. No increase in right ventricular wall thickness.  3. Left atrial size was moderately dilated.  4. Right atrial size was normal.  5. Moderate calcification of the mitral valve leaflet(s).  6. Moderate thickening of the mitral valve leaflet(s).  7. The mitral valve is normal in structure. Moderate to severe mitral valve regurgitation. -Patient received IV fluid. Kidney numbers improved. Overall feels better.  Fall with injury-right distal radius fracture, right shoulder abrasion, right-sided hematoma of the head: Right wrist splinted and wrapped-- will follow-up with Dr. Bess Harvest as outpatient. -CT head/cervical spine/maxillofacial negative for acute fractures or intracranial bleed. - Appreciate Ortho input, conservative mgmt -PT/OT eval-- appreciated. -Holding ASA due to hematoma which can be resumed after PCP visit  Ao CKI-III with h/o HTN Mild, suspect prerenal from hypovolemia.  -Continue gentle IV fluids -creat improved  Pulmonary emphysema with chronic respiratory failure: Chronic and stable. Continue supplemental O2.  CAD: Denies any chest pain. Troponin negative. Aspirin on hold given hematoma.  Hypertension: Mildly hypertensive on admission. Resume ramipril creatinine improved.Will continue Isordil.  Depression: Continue sertraline.  Dementia: At baseline.Continue delirium precautions.  spoke with patient's son Elta Guadeloupe over the phone and updated. Patient will return back to her facility with palliative care to follow  CONSULTS OBTAINED:  Treatment Team:  Dereck Leep, MD  DRUG ALLERGIES:   Allergies  Allergen Reactions  . Cefuroxime Axetil Rash    Pt states that she does fine with Keflex.    . Doxycycline Nausea Only  and Other (See Comments)    Reaction:  Weight loss   .  Advil [Ibuprofen] Swelling  . Celebrex [Celecoxib] Nausea Only  . Daypro [Oxaprozin] Other (See Comments)    Reaction:  Dizziness   . Etodolac Nausea Only  . Macrobid [employer] Macro] Other (See Comments)    Reaction:  Burning   . Penicillins Other (See Comments)    Reaction:  Burning   . Percocet [Oxycodone-Acetaminophen] Nausea Only and Swelling  . Tramadol Other (See Comments)    Reaction:  Unknown   . Valium [Diazepam] Other (See Comments)    Reaction:  Dizziness    . Neosporin [Neomycin-Bacitracin Zn-Polymyx] Rash  . Vicodin [Hydrocodone-Acetaminophen] Nausea Only    DISCHARGE MEDICATIONS:   Allergies as of 06/22/2019      Reactions   Cefuroxime Axetil Rash   Pt states that she does fine with Keflex.     Doxycycline Nausea Only, Other (See Comments)   Reaction:  Weight loss    Advil [ibuprofen] Swelling   Celebrex [celecoxib] Nausea Only   Daypro [oxaprozin] Other (See Comments)   Reaction:  Dizziness    Etodolac Nausea Only   Macrobid [nitrofurantoin Monohyd Macro] Other (See Comments)   Reaction:  Burning    Penicillins Other (See Comments)   Reaction:  Burning    Percocet [oxycodone-acetaminophen] Nausea Only, Swelling   Tramadol Other (See Comments)   Reaction:  Unknown    Valium [diazepam] Other (See Comments)   Reaction:  Dizziness    Neosporin [neomycin-bacitracin Zn-polymyx] Rash   Vicodin [hydrocodone-acetaminophen] Nausea Only      Medication List    STOP taking these medications   aspirin EC 81 MG tablet     TAKE these medications   acetaminophen 650 MG CR tablet Commonly known as: TYLENOL Take 650 mg by mouth every 4 (four) hours as needed for pain. Do not exceed 3 grams of APAP/day from all sources   Calcium 600-200 MG-UNIT tablet Take 1 tablet by mouth 2 (two) times daily.   CENTRUM SILVER ADULT 50+ PO Take 1 tablet by mouth daily.   ferrous sulfate 325 (65 FE) MG tablet Take 325 mg by mouth 2 (two) times daily.    Glucosamine-Chondroitin 250-200 MG Tabs Take 1 tablet by mouth 2 (two) times daily.   isosorbide dinitrate 10 MG tablet Commonly known as: ISORDIL Take 10 mg by mouth 2 (two) times daily.   Melatonin 5 MG Tabs Take 5 mg by mouth at bedtime.   nitroGLYCERIN 0.4 MG SL tablet Commonly known as: NITROSTAT Place 0.4 mg under the tongue every 5 (five) minutes as needed for chest pain.   ondansetron 4 MG tablet Commonly known as: ZOFRAN Take 4 mg by mouth every 6 (six) hours as needed for nausea or vomiting.   ramipril 2.5 MG capsule Commonly known as: ALTACE TAKE 1 CAPSULE BY MOUTH ONCE DAILY What changed: how much to take   senna-docusate 8.6-50 MG tablet Commonly known as: Senokot-S Take 1 tablet by mouth daily.   sertraline 50 MG tablet Commonly known as: ZOLOFT TAKE 1 TABLET BY MOUTH ONCE DAILY   traMADol 50 MG tablet Commonly known as: ULTRAM Take 1 tablet (50 mg total) by mouth every 6 (six) hours as needed for moderate pain.       If you experience worsening of your admission symptoms, develop shortness of breath, life threatening emergency, suicidal or homicidal thoughts you must seek medical attention immediately by calling 911 or calling your MD immediately  if symptoms less severe.  You Must read complete instructions/literature along with all the possible adverse reactions/side effects for all the Medicines you take and that have been prescribed to you. Take any new Medicines after you have completely understood and accept all the possible adverse reactions/side effects.   Please note  You were cared for by a hospitalist during your hospital stay. If you have any questions about your discharge medications or the care you received while you were in the hospital after you are discharged, you can call the unit and asked to speak with the hospitalist on call if the hospitalist that took care of you is not available. Once you are discharged, your primary care physician  will handle any further medical issues. Please note that NO REFILLS for any discharge medications will be authorized once you are discharged, as it is imperative that you return to your primary care physician (or establish a relationship with a primary care physician if you do not have one) for your aftercare needs so that they can reassess your need for medications and monitor your lab values. Today   SUBJECTIVE  poor historian. Asking  for more coffee this morning. Denies pain present however does hurt when moves her shoulder   VITAL SIGNS:  Blood pressure (!) 158/67, pulse 73, temperature 97.7 F (36.5 C), temperature source Oral, resp. rate 18, height 5' (1.524 m), weight 54.4 kg, SpO2 98 %.  I/O:    Intake/Output Summary (Last 24 hours) at 06/22/2019 1254 Last data filed at 06/22/2019 0900 Gross per 24 hour  Intake 600 ml  Output 250 ml  Net 350 ml    PHYSICAL EXAMINATION:  GENERAL:  85 y.o.-year-old patient lying in the bed with no acute distress.  EYES: Pupils equal, round, reactive to light and accommodation. No scleral icterus. Extraocular muscles intact.  HEENT: Head atraumatic, normocephalic. Oropharynx and nasopharynx clear. Facial bruise NECK:  Supple, no jugular venous distention. No thyroid enlargement, no tenderness. Neck bruise LUNGS: Normal breath sounds bilaterally, no wheezing, rales,rhonchi or crepitation. No use of accessory muscles of respiration.  CARDIOVASCULAR: S1, S2 normal. No murmurs, rubs, or gallops.  ABDOMEN: Soft, non-tender, non-distended. Bowel sounds present. No organomegaly or mass.  EXTREMITIES: No pedal edema, cyanosis, or clubbing. Right hand splint + PSYCHIATRIC: The patient is alert and awake.   DATA REVIEW:   CBC  Recent Labs  Lab 06/21/19 0556  WBC 5.9  HGB 9.1*  HCT 28.3*  PLT 109*    Chemistries  Recent Labs  Lab 06/20/19 1821 06/21/19 0556  NA 139 139  K 4.4 4.4  CL 101 102  CO2 24 28  GLUCOSE 109* 100*  BUN 43* 36*   CREATININE 1.32* 1.13*  CALCIUM 9.6 9.3  AST 20  --   ALT 11  --   ALKPHOS 35*  --   BILITOT 0.5  --     Microbiology Results   Recent Results (from the past 240 hour(s))  SARS Coronavirus 2 by RT PCR (hospital order, performed in [place] hospital lab) Nasopharyngeal Nasopharyngeal Swab     Status: None   Collection Time: 06/20/19  9:18 PM   Specimen: Nasopharyngeal Swab  Result Value Ref Range Status   SARS Coronavirus 2 NEGATIVE NEGATIVE Final    Comment: (NOTE) If result is NEGATIVE SARS-CoV-2 target nucleic acids are NOT DETECTED. The SARS-CoV-2 RNA is generally detectable in upper and lower  respiratory specimens during the acute phase of infection. The lowest  concentration of SARS-CoV-2  viral copies this assay can detect is 250  copies / mL. A negative result does not preclude SARS-CoV-2 infection  and should not be used as the sole basis for treatment or other  patient management decisions.  A negative result may occur with  improper specimen collection / handling, submission of specimen other  than nasopharyngeal swab, presence of viral mutation(s) within the  areas targeted by this assay, and inadequate number of viral copies  (<250 copies / mL). A negative result must be combined with clinical  observations, patient history, and epidemiological information. If result is POSITIVE SARS-CoV-2 target nucleic acids are DETECTED. The SARS-CoV-2 RNA is generally detectable in upper and lower  respiratory specimens dur ing the acute phase of infection.  Positive  results are indicative of active infection with SARS-CoV-2.  Clinical  correlation with patient history and other diagnostic information is  necessary to determine patient infection status.  Positive results do  not rule out bacterial infection or co-infection with other viruses. If result is PRESUMPTIVE POSTIVE SARS-CoV-2 nucleic acids MAY BE PRESENT.   A presumptive positive result was obtained on the  submitted specimen  and confirmed on repeat testing.  While 2019 novel coronavirus  (SARS-CoV-2) nucleic acids may be present in the submitted sample  additional confirmatory testing may be necessary for epidemiological  and / or clinical management purposes  to differentiate between  SARS-CoV-2 and other Sarbecovirus currently known to infect humans.  If clinically indicated additional testing with an alternate test  methodology [phone]) is advised. The SARS-CoV-2 RNA is generally  detectable in upper and lower respiratory sp ecimens during the acute  phase of infection. The expected result is Negative. Fact Sheet for Patients:  StrictlyIdeas.no Fact Sheet for Healthcare Providers: BankingDealers.co.za This test is not yet approved or cleared by the [place] FDA and has been authorized for detection and/or diagnosis of SARS-CoV-2 by FDA under an Emergency Use Authorization (EUA).  This EUA will remain in effect (meaning this test can be used) for the duration of the COVID-19 declaration under Section 564(b)(1) of the Act, 21 U.S.C. section 360bbb-3(b)(1), unless the authorization is terminated or revoked sooner. Performed at [facility], [address], [place], [place] [address]     RADIOLOGY:  Dg Shoulder Right  Result Date: 06/20/2019 CLINICAL DATA:  Fall. Right shoulder bruising. EXAM: RIGHT SHOULDER - 2+ VIEW COMPARISON:  Two-view chest x-ray 03/25/2017 FINDINGS: The right shoulder is located. Acromial spur is noted. No acute fracture is present. Chronic lung changes are stable. No acute fractures are present. IMPRESSION: 1. No acute abnormality or significant interval change. 2. Acromial spur. 3. Stable chronic lung disease. Electronically Signed   By: San Morelle M.D.   On: 06/20/2019 19:02   Dg Wrist Complete Right  Result Date: 06/20/2019 CLINICAL DATA:  Fall.  Right wrist deformity. EXAM: RIGHT WRIST  - COMPLETE 3+ VIEW COMPARISON:  None. FINDINGS: Distal radial fracture demonstrates impaction. There is probable intra-articular extension. No significant angulation is present. Carpal bones are intact. Ulna is unremarkable. Advanced degenerative changes are noted at the first [facility] joint with significant radial subluxation. IMPRESSION: 1. Impacted fracture of the distal radius with probable intra-articular extension. 2. Advanced degenerative changes. Electronically Signed   By: San Morelle M.D.   On: 06/20/2019 19:04   Ct Head Wo Contrast  Result Date: 06/20/2019 CLINICAL DATA:  Syncopal episode hit head on floor laceration to right forehead EXAM: CT HEAD WITHOUT CONTRAST CT MAXILLOFACIAL WITHOUT CONTRAST CT CERVICAL  SPINE WITHOUT CONTRAST TECHNIQUE: Multidetector CT imaging of the head, cervical spine, and maxillofacial structures were performed using the standard protocol without intravenous contrast. Multiplanar CT image reconstructions of the cervical spine and maxillofacial structures were also generated. COMPARISON:  CT 02/04/2017 FINDINGS: CT HEAD FINDINGS Brain: no acute territorial infarction, hemorrhage or intracranial mass. Atrophy and moderate small vessel ischemic changes of the white matter. Stable ventricle size. Vascular: No hyperdense vessels.  Carotid vascular calcification Skull: Normal. Negative for fracture or focal lesion. Other: Moderate to large right forehead hematoma and laceration CT MAXILLOFACIAL FINDINGS Osseous: No mandibular fracture. Pterygoid plates and zygomatic arches are intact. Mastoid air cells are clear. No nasal bone fracture Orbits: Negative. No traumatic or inflammatory finding. Sinuses: Moderate circumferential thickening in the left maxillary sinus with scattered calcification and hyperdense secretions. No acute sinus wall fracture. Bony wall thickening of left maxillary sinus consistent with chronic sinusitis. Soft tissues: Large right forehead soft tissue  hematoma. Moderate right periorbital soft tissue swelling. Moderate right premalar soft tissue stranding CT CERVICAL SPINE FINDINGS Alignment: Stable 3.5 mm anterolisthesis C3 on C4. Stable 2 mm retrolisthesis C3 on C4. Stable 2 mm retrolisthesis C5 on C6. Trace retrolisthesis C6 on C7. Facet alignment within normal limits. Skull base and vertebrae: No acute fracture. No primary bone lesion or focal pathologic process. Soft tissues and spinal canal: No prevertebral fluid or swelling. No visible canal hematoma. Disc levels: Prominent calcification and pannus at C1-C2 articulation. Advanced degenerative changes C3 through C7. Multiple level facet degenerative change and multiple level foraminal stenosis. Upper chest: Apical fibrosis. Other: None IMPRESSION: 1. No CT evidence for acute intracranial abnormality. Atrophy and small vessel ischemic changes of the white matter. 2. No acute facial bone fracture 3. Stable alignment of cervical spine with degenerative changes. No acute fracture 4. Moderate to large right forehead, periorbital and premalar soft tissue hematoma Electronically Signed   By: Donavan Foil M.D.   On: 06/20/2019 19:30   Ct Cervical Spine Wo Contrast  Result Date: 06/20/2019 CLINICAL DATA:  Syncopal episode hit head on floor laceration to right forehead EXAM: CT HEAD WITHOUT CONTRAST CT MAXILLOFACIAL WITHOUT CONTRAST CT CERVICAL SPINE WITHOUT CONTRAST TECHNIQUE: Multidetector CT imaging of the head, cervical spine, and maxillofacial structures were performed using the standard protocol without intravenous contrast. Multiplanar CT image reconstructions of the cervical spine and maxillofacial structures were also generated. COMPARISON:  CT 02/04/2017 FINDINGS: CT HEAD FINDINGS Brain: no acute territorial infarction, hemorrhage or intracranial mass. Atrophy and moderate small vessel ischemic changes of the white matter. Stable ventricle size. Vascular: No hyperdense vessels.  Carotid vascular  calcification Skull: Normal. Negative for fracture or focal lesion. Other: Moderate to large right forehead hematoma and laceration CT MAXILLOFACIAL FINDINGS Osseous: No mandibular fracture. Pterygoid plates and zygomatic arches are intact. Mastoid air cells are clear. No nasal bone fracture Orbits: Negative. No traumatic or inflammatory finding. Sinuses: Moderate circumferential thickening in the left maxillary sinus with scattered calcification and hyperdense secretions. No acute sinus wall fracture. Bony wall thickening of left maxillary sinus consistent with chronic sinusitis. Soft tissues: Large right forehead soft tissue hematoma. Moderate right periorbital soft tissue swelling. Moderate right premalar soft tissue stranding CT CERVICAL SPINE FINDINGS Alignment: Stable 3.5 mm anterolisthesis C3 on C4. Stable 2 mm retrolisthesis C3 on C4. Stable 2 mm retrolisthesis C5 on C6. Trace retrolisthesis C6 on C7. Facet alignment within normal limits. Skull base and vertebrae: No acute fracture. No primary bone lesion or focal pathologic process. Soft tissues and  spinal canal: No prevertebral fluid or swelling. No visible canal hematoma. Disc levels: Prominent calcification and pannus at C1-C2 articulation. Advanced degenerative changes C3 through C7. Multiple level facet degenerative change and multiple level foraminal stenosis. Upper chest: Apical fibrosis. Other: None IMPRESSION: 1. No CT evidence for acute intracranial abnormality. Atrophy and small vessel ischemic changes of the white matter. 2. No acute facial bone fracture 3. Stable alignment of cervical spine with degenerative changes. No acute fracture 4. Moderate to large right forehead, periorbital and premalar soft tissue hematoma Electronically Signed   By: Donavan Foil M.D.   On: 06/20/2019 19:30   Ct Maxillofacial Wo Cm  Result Date: 06/20/2019 CLINICAL DATA:  Syncopal episode hit head on floor laceration to right forehead EXAM: CT HEAD WITHOUT  CONTRAST CT MAXILLOFACIAL WITHOUT CONTRAST CT CERVICAL SPINE WITHOUT CONTRAST TECHNIQUE: Multidetector CT imaging of the head, cervical spine, and maxillofacial structures were performed using the standard protocol without intravenous contrast. Multiplanar CT image reconstructions of the cervical spine and maxillofacial structures were also generated. COMPARISON:  CT 02/04/2017 FINDINGS: CT HEAD FINDINGS Brain: no acute territorial infarction, hemorrhage or intracranial mass. Atrophy and moderate small vessel ischemic changes of the white matter. Stable ventricle size. Vascular: No hyperdense vessels.  Carotid vascular calcification Skull: Normal. Negative for fracture or focal lesion. Other: Moderate to large right forehead hematoma and laceration CT MAXILLOFACIAL FINDINGS Osseous: No mandibular fracture. Pterygoid plates and zygomatic arches are intact. Mastoid air cells are clear. No nasal bone fracture Orbits: Negative. No traumatic or inflammatory finding. Sinuses: Moderate circumferential thickening in the left maxillary sinus with scattered calcification and hyperdense secretions. No acute sinus wall fracture. Bony wall thickening of left maxillary sinus consistent with chronic sinusitis. Soft tissues: Large right forehead soft tissue hematoma. Moderate right periorbital soft tissue swelling. Moderate right premalar soft tissue stranding CT CERVICAL SPINE FINDINGS Alignment: Stable 3.5 mm anterolisthesis C3 on C4. Stable 2 mm retrolisthesis C3 on C4. Stable 2 mm retrolisthesis C5 on C6. Trace retrolisthesis C6 on C7. Facet alignment within normal limits. Skull base and vertebrae: No acute fracture. No primary bone lesion or focal pathologic process. Soft tissues and spinal canal: No prevertebral fluid or swelling. No visible canal hematoma. Disc levels: Prominent calcification and pannus at C1-C2 articulation. Advanced degenerative changes C3 through C7. Multiple level facet degenerative change and multiple  level foraminal stenosis. Upper chest: Apical fibrosis. Other: None IMPRESSION: 1. No CT evidence for acute intracranial abnormality. Atrophy and small vessel ischemic changes of the white matter. 2. No acute facial bone fracture 3. Stable alignment of cervical spine with degenerative changes. No acute fracture 4. Moderate to large right forehead, periorbital and premalar soft tissue hematoma Electronically Signed   By: Donavan Foil M.D.   On: 06/20/2019 19:30     CODE STATUS:     Code Status Orders  (From admission, onward)         Start     Ordered   06/20/19 2222  Do not attempt resuscitation (DNR)  Continuous    Question Answer Comment  In the event of cardiac or respiratory ARREST Do not call a "code blue"   In the event of cardiac or respiratory ARREST Do not perform Intubation, CPR, defibrillation or ACLS   In the event of cardiac or respiratory ARREST Use medication by any route, position, wound care, and other measures to relive pain and suffering. May use oxygen, suction and manual treatment of airway obstruction as needed for comfort.  06/20/19 2224        Code Status History    Date Active Date Inactive Code Status Order ID Comments User Context   03/24/2018 0908 03/25/2018 2036 DNR [id number]  Bettey Costa, MD Inpatient   03/23/2018 0424 03/24/2018 0908 Full Code [id number]  Amelia Jo, MD Inpatient   02/04/2017 0238 02/10/2017 1817 Full Code [id number]  Lance Coon, MD ED   09/06/2015 1246 09/06/2015 1907 Full Code [id number]  Hessie Knows, MD Inpatient   01/24/2015 1430 01/26/2015 1907 Full Code [id number]  Max Sane, MD Inpatient   01/12/2015 2248 01/15/2015 1428 Full Code [id number]  Aldean Jewett, MD Inpatient   Advance Care Planning Activity    Advance Directive Documentation     Most Recent Value  Type of Advance Directive  Living will  Pre-existing out of facility DNR order (yellow form or pink MOST form)  -  "MOST" Form in Place?  -      TOTAL TIME TAKING CARE  OF THIS PATIENT: *40* minutes.    Fritzi Mandes M.D on 06/22/2019 at 12:54 PM  Between 7am to 6pm - Pager - [phone] After 6pm go to [email or web address] - password [email or web address]    CC: Primary care physician; [name], MD

## 2019-06-22 NOTE — TOC Transition Note (Signed)
Transition of Care [facility]) - CM/SW Discharge Note   Patient Details  Name: Kayla Galloway MRN: [id number] Date of Birth: Mar 15, 1921  Transition [facility]) CM/SW Contact:  Shelbie Hutching, RN Phone Number: 06/22/2019, 2:19 PM   Clinical Narrative:    Patient will discharge back to [facility] today, transport will be via [facility].  Referral has been placed to [facility].  [place] requests that Hospice start be postponed until after patient gets some rehab- PT did recommend skilled PT at facility.  Bedside RN will call report to [phone], patient is going to room 502.     Final next level of care: Skilled Nursing Facility Barriers to Discharge: Barriers Resolved   Patient Goals and CMS Choice        Discharge Placement              Patient chooses bed at: [facility] Patient to be transferred to facility by: [facility] Name of family member notified: Marcie Mowers- Nephew Patient and family notified of of transfer: 06/22/19  Discharge Plan and Services   Discharge Planning Services: CM Consult                                 Social Determinants of Health ([place]) Interventions     Readmission Risk Interventions No flowsheet data found.

## 2019-06-22 NOTE — Progress Notes (Signed)
New referral for [facility] hospice services at [facility] received from [employer]. Plan is for discharge today via EMS. Patient information faxed to referral. Telephone call to patient's nephew and Emelda Fear to discuss hospice services, message left for return call. Signed DNR in place. Flo Shanks BSN, RN, [place] [phone]

## 2019-06-22 NOTE — Progress Notes (Signed)
Report give to [facility].

## 2019-06-22 NOTE — Progress Notes (Signed)
Notified by [facility] Doran Clay that the plan is now for patient to have Skilled Rehab days at [facility]. Telephone call placed to patient's nephew to inform him that hospice would not be able to start following until skilled rehab days were finished. Referral made aware. Flo Shanks BSN, RN, [place] [phone]

## 2019-06-23 DIAGNOSIS — J9611 Chronic respiratory failure with hypoxia: Secondary | ICD-10-CM | POA: Diagnosis not present

## 2019-06-23 DIAGNOSIS — I25118 Atherosclerotic heart disease of native coronary artery with other forms of angina pectoris: Secondary | ICD-10-CM | POA: Diagnosis not present

## 2019-06-23 DIAGNOSIS — J439 Emphysema, unspecified: Secondary | ICD-10-CM | POA: Diagnosis not present

## 2019-06-23 DIAGNOSIS — E441 Mild protein-calorie malnutrition: Secondary | ICD-10-CM | POA: Diagnosis not present

## 2019-06-24 ENCOUNTER — Telehealth: Payer: Self-pay

## 2019-06-24 NOTE — Telephone Encounter (Signed)
Unable to reach patient for transitional care management. No answer. No voice mail. According to discharge note return to facility with palliative care.

## 2019-07-07 DIAGNOSIS — M25531 Pain in right wrist: Secondary | ICD-10-CM | POA: Diagnosis not present

## 2019-07-07 DIAGNOSIS — S52501A Unspecified fracture of the lower end of right radius, initial encounter for closed fracture: Secondary | ICD-10-CM | POA: Diagnosis not present

## 2019-07-18 IMAGING — DX DG CHEST 1V PORT
1 series · 1 of 1 positions shown · non-contrast
Comparison: Chest radiograph performed 03/25/2017

CLINICAL DATA: Acute onset of shortness of breath and hypoxemia.

EXAM:
PORTABLE CHEST 1 VIEW

[chest ap]
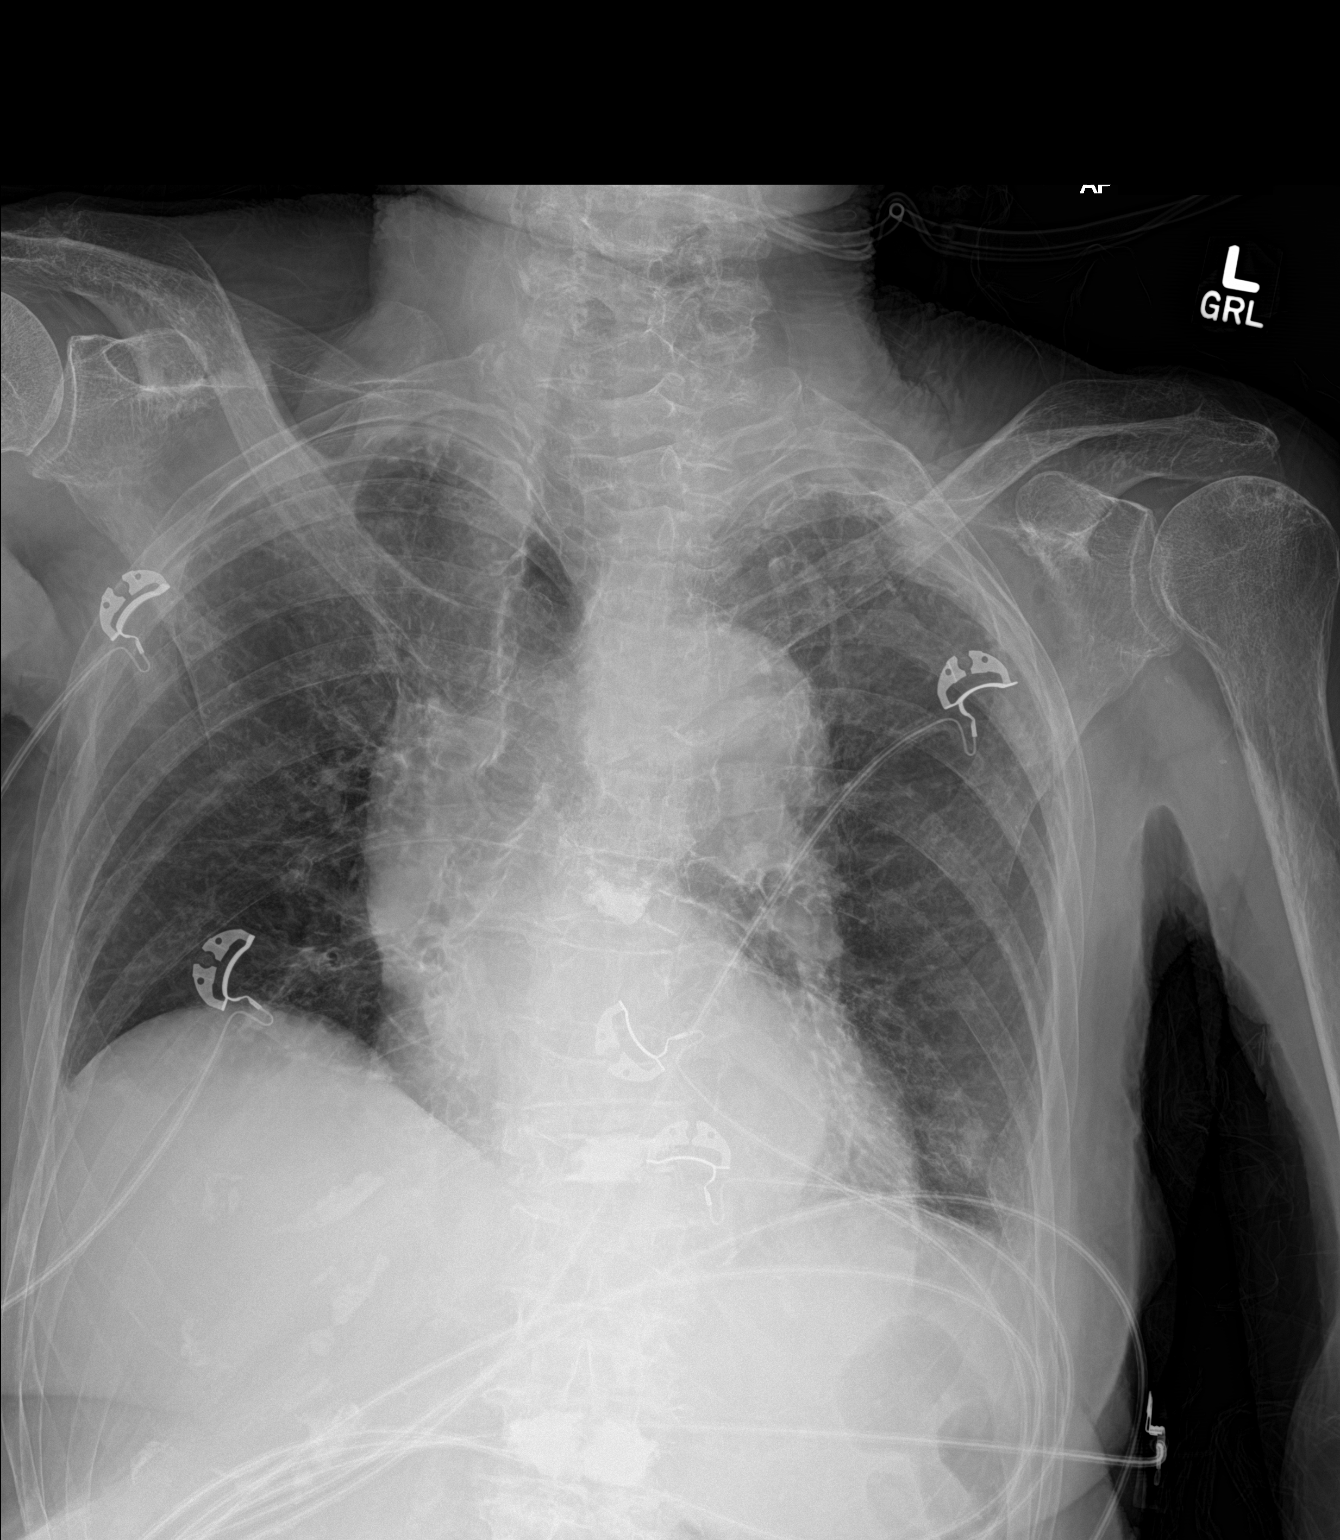

[1 of 1 positions shown; findings below may reference images not displayed]

FINDINGS: The lungs are mildly hypoexpanded. Mild left basilar airspace
opacity may reflect atelectasis or mild pneumonia. There is no
evidence of pleural effusion or pneumothorax.

The cardiomediastinal silhouette is within normal limits. No acute
osseous abnormalities are seen. The patient is status post
vertebroplasty at multiple levels along the thoracic spine.
IMPRESSION: Lungs mildly hypoexpanded. Mild left basilar airspace opacity may
reflect atelectasis or mild pneumonia.

## 2019-07-19 DIAGNOSIS — N390 Urinary tract infection, site not specified: Secondary | ICD-10-CM | POA: Diagnosis not present

## 2019-07-19 DIAGNOSIS — R319 Hematuria, unspecified: Secondary | ICD-10-CM | POA: Diagnosis not present

## 2019-07-19 DIAGNOSIS — Z79899 Other long term (current) drug therapy: Secondary | ICD-10-CM | POA: Diagnosis not present

## 2019-07-22 DIAGNOSIS — S52501A Unspecified fracture of the lower end of right radius, initial encounter for closed fracture: Secondary | ICD-10-CM | POA: Diagnosis not present

## 2019-07-22 DIAGNOSIS — M25531 Pain in right wrist: Secondary | ICD-10-CM | POA: Diagnosis not present

## 2019-08-04 DIAGNOSIS — I25118 Atherosclerotic heart disease of native coronary artery with other forms of angina pectoris: Secondary | ICD-10-CM | POA: Diagnosis not present

## 2019-08-04 DIAGNOSIS — E441 Mild protein-calorie malnutrition: Secondary | ICD-10-CM | POA: Diagnosis not present

## 2019-08-04 DIAGNOSIS — J439 Emphysema, unspecified: Secondary | ICD-10-CM | POA: Diagnosis not present

## 2019-08-15 DIAGNOSIS — N289 Disorder of kidney and ureter, unspecified: Secondary | ICD-10-CM | POA: Diagnosis not present

## 2019-08-15 DIAGNOSIS — E441 Mild protein-calorie malnutrition: Secondary | ICD-10-CM | POA: Diagnosis not present

## 2019-08-15 DIAGNOSIS — J439 Emphysema, unspecified: Secondary | ICD-10-CM | POA: Diagnosis not present

## 2019-08-15 DIAGNOSIS — R0902 Hypoxemia: Secondary | ICD-10-CM | POA: Diagnosis not present

## 2019-08-15 DIAGNOSIS — D649 Anemia, unspecified: Secondary | ICD-10-CM | POA: Diagnosis not present

## 2019-08-15 DIAGNOSIS — M40204 Unspecified kyphosis, thoracic region: Secondary | ICD-10-CM | POA: Diagnosis not present

## 2019-08-15 DIAGNOSIS — S52501D Unspecified fracture of the lower end of right radius, subsequent encounter for closed fracture with routine healing: Secondary | ICD-10-CM | POA: Diagnosis not present

## 2019-08-15 DIAGNOSIS — G4700 Insomnia, unspecified: Secondary | ICD-10-CM | POA: Diagnosis not present

## 2019-08-15 DIAGNOSIS — E871 Hypo-osmolality and hyponatremia: Secondary | ICD-10-CM | POA: Diagnosis not present

## 2019-08-15 DIAGNOSIS — I739 Peripheral vascular disease, unspecified: Secondary | ICD-10-CM | POA: Diagnosis not present

## 2019-08-15 DIAGNOSIS — Z85828 Personal history of other malignant neoplasm of skin: Secondary | ICD-10-CM | POA: Diagnosis not present

## 2019-08-15 DIAGNOSIS — J302 Other seasonal allergic rhinitis: Secondary | ICD-10-CM | POA: Diagnosis not present

## 2019-08-15 DIAGNOSIS — M8588 Other specified disorders of bone density and structure, other site: Secondary | ICD-10-CM | POA: Diagnosis not present

## 2019-08-15 DIAGNOSIS — I25119 Atherosclerotic heart disease of native coronary artery with unspecified angina pectoris: Secondary | ICD-10-CM | POA: Diagnosis not present

## 2019-08-15 DIAGNOSIS — J9610 Chronic respiratory failure, unspecified whether with hypoxia or hypercapnia: Secondary | ICD-10-CM | POA: Diagnosis not present

## 2019-08-15 DIAGNOSIS — Z9981 Dependence on supplemental oxygen: Secondary | ICD-10-CM | POA: Diagnosis not present

## 2019-08-15 DIAGNOSIS — M1990 Unspecified osteoarthritis, unspecified site: Secondary | ICD-10-CM | POA: Diagnosis not present

## 2019-08-15 DIAGNOSIS — I10 Essential (primary) hypertension: Secondary | ICD-10-CM | POA: Diagnosis not present

## 2019-08-15 DIAGNOSIS — E7800 Pure hypercholesterolemia, unspecified: Secondary | ICD-10-CM | POA: Diagnosis not present

## 2019-08-15 DIAGNOSIS — W19XXXD Unspecified fall, subsequent encounter: Secondary | ICD-10-CM | POA: Diagnosis not present

## 2019-08-15 DIAGNOSIS — M109 Gout, unspecified: Secondary | ICD-10-CM | POA: Diagnosis not present

## 2019-08-15 DIAGNOSIS — M5135 Other intervertebral disc degeneration, thoracolumbar region: Secondary | ICD-10-CM | POA: Diagnosis not present

## 2019-08-15 DIAGNOSIS — M810 Age-related osteoporosis without current pathological fracture: Secondary | ICD-10-CM | POA: Diagnosis not present

## 2019-08-19 DIAGNOSIS — I130 Hypertensive heart and chronic kidney disease with heart failure and stage 1 through stage 4 chronic kidney disease, or unspecified chronic kidney disease: Secondary | ICD-10-CM | POA: Diagnosis not present

## 2019-08-19 DIAGNOSIS — I739 Peripheral vascular disease, unspecified: Secondary | ICD-10-CM | POA: Diagnosis not present

## 2019-08-19 DIAGNOSIS — M109 Gout, unspecified: Secondary | ICD-10-CM | POA: Diagnosis not present

## 2019-08-19 DIAGNOSIS — E441 Mild protein-calorie malnutrition: Secondary | ICD-10-CM | POA: Diagnosis not present

## 2019-08-19 DIAGNOSIS — M1990 Unspecified osteoarthritis, unspecified site: Secondary | ICD-10-CM | POA: Diagnosis not present

## 2019-08-19 DIAGNOSIS — I10 Essential (primary) hypertension: Secondary | ICD-10-CM | POA: Diagnosis not present

## 2019-08-19 DIAGNOSIS — D500 Iron deficiency anemia secondary to blood loss (chronic): Secondary | ICD-10-CM | POA: Diagnosis not present

## 2019-08-19 DIAGNOSIS — I25119 Atherosclerotic heart disease of native coronary artery with unspecified angina pectoris: Secondary | ICD-10-CM | POA: Diagnosis not present

## 2019-08-19 DIAGNOSIS — B965 Pseudomonas (aeruginosa) (mallei) (pseudomallei) as the cause of diseases classified elsewhere: Secondary | ICD-10-CM | POA: Diagnosis not present

## 2019-08-19 DIAGNOSIS — J439 Emphysema, unspecified: Secondary | ICD-10-CM | POA: Diagnosis not present

## 2019-08-19 DIAGNOSIS — Z8616 Personal history of COVID-19: Secondary | ICD-10-CM | POA: Diagnosis not present

## 2019-08-19 DIAGNOSIS — S32511D Fracture of superior rim of right pubis, subsequent encounter for fracture with routine healing: Secondary | ICD-10-CM | POA: Diagnosis not present

## 2019-08-19 DIAGNOSIS — R05 Cough: Secondary | ICD-10-CM | POA: Diagnosis not present

## 2019-08-19 DIAGNOSIS — Z9981 Dependence on supplemental oxygen: Secondary | ICD-10-CM | POA: Diagnosis not present

## 2019-08-19 DIAGNOSIS — I509 Heart failure, unspecified: Secondary | ICD-10-CM | POA: Diagnosis not present

## 2019-08-19 DIAGNOSIS — W19XXXD Unspecified fall, subsequent encounter: Secondary | ICD-10-CM | POA: Diagnosis not present

## 2019-08-19 DIAGNOSIS — N390 Urinary tract infection, site not specified: Secondary | ICD-10-CM | POA: Diagnosis not present

## 2019-08-19 DIAGNOSIS — D509 Iron deficiency anemia, unspecified: Secondary | ICD-10-CM | POA: Diagnosis not present

## 2019-08-19 DIAGNOSIS — S32110D Nondisplaced Zone I fracture of sacrum, subsequent encounter for fracture with routine healing: Secondary | ICD-10-CM | POA: Diagnosis not present

## 2019-08-19 DIAGNOSIS — S52501D Unspecified fracture of the lower end of right radius, subsequent encounter for closed fracture with routine healing: Secondary | ICD-10-CM | POA: Diagnosis not present

## 2019-08-19 DIAGNOSIS — M810 Age-related osteoporosis without current pathological fracture: Secondary | ICD-10-CM | POA: Diagnosis not present

## 2019-08-19 DIAGNOSIS — N1831 Chronic kidney disease, stage 3a: Secondary | ICD-10-CM | POA: Diagnosis not present

## 2019-08-19 DIAGNOSIS — D649 Anemia, unspecified: Secondary | ICD-10-CM | POA: Diagnosis not present

## 2019-08-19 DIAGNOSIS — E7800 Pure hypercholesterolemia, unspecified: Secondary | ICD-10-CM | POA: Diagnosis not present

## 2019-08-19 DIAGNOSIS — J9611 Chronic respiratory failure with hypoxia: Secondary | ICD-10-CM | POA: Diagnosis not present

## 2019-09-03 ENCOUNTER — Inpatient Hospital Stay
Admission: EM | Admit: 2019-09-03 | Discharge: 2019-09-05 | DRG: 552 | Disposition: A | Discharge status: Skilled Nursing Facility | Payer: Medicare Other | Attending: Internal Medicine | Admitting: Internal Medicine

## 2019-09-03 ENCOUNTER — Emergency Department: Payer: Medicare Other

## 2019-09-03 ENCOUNTER — Encounter: Payer: Self-pay | Admitting: Emergency Medicine

## 2019-09-03 ENCOUNTER — Other Ambulatory Visit: Payer: Self-pay

## 2019-09-03 ENCOUNTER — Observation Stay: Payer: Medicare Other

## 2019-09-03 DIAGNOSIS — S329XXA Fracture of unspecified parts of lumbosacral spine and pelvis, initial encounter for closed fracture: Secondary | ICD-10-CM | POA: Diagnosis present

## 2019-09-03 DIAGNOSIS — S32110A Nondisplaced Zone I fracture of sacrum, initial encounter for closed fracture: Secondary | ICD-10-CM | POA: Diagnosis not present

## 2019-09-03 DIAGNOSIS — S32301D Unspecified fracture of right ilium, subsequent encounter for fracture with routine healing: Secondary | ICD-10-CM | POA: Diagnosis not present

## 2019-09-03 DIAGNOSIS — E860 Dehydration: Secondary | ICD-10-CM | POA: Diagnosis present

## 2019-09-03 DIAGNOSIS — M25511 Pain in right shoulder: Secondary | ICD-10-CM | POA: Diagnosis not present

## 2019-09-03 DIAGNOSIS — M5030 Other cervical disc degeneration, unspecified cervical region: Secondary | ICD-10-CM | POA: Diagnosis not present

## 2019-09-03 DIAGNOSIS — M25519 Pain in unspecified shoulder: Secondary | ICD-10-CM

## 2019-09-03 DIAGNOSIS — M25551 Pain in right hip: Secondary | ICD-10-CM | POA: Diagnosis not present

## 2019-09-03 DIAGNOSIS — S3210XA Unspecified fracture of sacrum, initial encounter for closed fracture: Secondary | ICD-10-CM | POA: Diagnosis present

## 2019-09-03 DIAGNOSIS — S0990XA Unspecified injury of head, initial encounter: Secondary | ICD-10-CM | POA: Diagnosis not present

## 2019-09-03 DIAGNOSIS — Z95828 Presence of other vascular implants and grafts: Secondary | ICD-10-CM

## 2019-09-03 DIAGNOSIS — S52571D Other intraarticular fracture of lower end of right radius, subsequent encounter for closed fracture with routine healing: Secondary | ICD-10-CM

## 2019-09-03 DIAGNOSIS — Z881 Allergy status to other antibiotic agents status: Secondary | ICD-10-CM | POA: Diagnosis not present

## 2019-09-03 DIAGNOSIS — S79921A Unspecified injury of right thigh, initial encounter: Secondary | ICD-10-CM | POA: Diagnosis not present

## 2019-09-03 DIAGNOSIS — R58 Hemorrhage, not elsewhere classified: Secondary | ICD-10-CM | POA: Diagnosis not present

## 2019-09-03 DIAGNOSIS — I10 Essential (primary) hypertension: Secondary | ICD-10-CM | POA: Diagnosis not present

## 2019-09-03 DIAGNOSIS — Z743 Need for continuous supervision: Secondary | ICD-10-CM | POA: Diagnosis not present

## 2019-09-03 DIAGNOSIS — Z66 Do not resuscitate: Secondary | ICD-10-CM | POA: Diagnosis present

## 2019-09-03 DIAGNOSIS — Z87442 Personal history of urinary calculi: Secondary | ICD-10-CM

## 2019-09-03 DIAGNOSIS — S32301A Unspecified fracture of right ilium, initial encounter for closed fracture: Secondary | ICD-10-CM

## 2019-09-03 DIAGNOSIS — Z20822 Contact with and (suspected) exposure to covid-19: Secondary | ICD-10-CM | POA: Diagnosis present

## 2019-09-03 DIAGNOSIS — K219 Gastro-esophageal reflux disease without esophagitis: Secondary | ICD-10-CM | POA: Diagnosis present

## 2019-09-03 DIAGNOSIS — S0101XA Laceration without foreign body of scalp, initial encounter: Secondary | ICD-10-CM | POA: Diagnosis present

## 2019-09-03 DIAGNOSIS — Z86718 Personal history of other venous thrombosis and embolism: Secondary | ICD-10-CM

## 2019-09-03 DIAGNOSIS — M47812 Spondylosis without myelopathy or radiculopathy, cervical region: Secondary | ICD-10-CM | POA: Diagnosis not present

## 2019-09-03 DIAGNOSIS — D649 Anemia, unspecified: Secondary | ICD-10-CM | POA: Diagnosis not present

## 2019-09-03 DIAGNOSIS — N390 Urinary tract infection, site not specified: Secondary | ICD-10-CM | POA: Diagnosis not present

## 2019-09-03 DIAGNOSIS — Z90721 Acquired absence of ovaries, unilateral: Secondary | ICD-10-CM

## 2019-09-03 DIAGNOSIS — S32511A Fracture of superior rim of right pubis, initial encounter for closed fracture: Secondary | ICD-10-CM

## 2019-09-03 DIAGNOSIS — S3210XD Unspecified fracture of sacrum, subsequent encounter for fracture with routine healing: Secondary | ICD-10-CM | POA: Diagnosis not present

## 2019-09-03 DIAGNOSIS — W1830XA Fall on same level, unspecified, initial encounter: Secondary | ICD-10-CM | POA: Diagnosis present

## 2019-09-03 DIAGNOSIS — S299XXA Unspecified injury of thorax, initial encounter: Secondary | ICD-10-CM | POA: Diagnosis not present

## 2019-09-03 DIAGNOSIS — Z8249 Family history of ischemic heart disease and other diseases of the circulatory system: Secondary | ICD-10-CM

## 2019-09-03 DIAGNOSIS — M810 Age-related osteoporosis without current pathological fracture: Secondary | ICD-10-CM | POA: Diagnosis not present

## 2019-09-03 DIAGNOSIS — S52571A Other intraarticular fracture of lower end of right radius, initial encounter for closed fracture: Secondary | ICD-10-CM | POA: Diagnosis not present

## 2019-09-03 DIAGNOSIS — E7800 Pure hypercholesterolemia, unspecified: Secondary | ICD-10-CM | POA: Diagnosis not present

## 2019-09-03 DIAGNOSIS — R52 Pain, unspecified: Secondary | ICD-10-CM | POA: Diagnosis not present

## 2019-09-03 DIAGNOSIS — I2510 Atherosclerotic heart disease of native coronary artery without angina pectoris: Secondary | ICD-10-CM | POA: Diagnosis present

## 2019-09-03 DIAGNOSIS — S4991XA Unspecified injury of right shoulder and upper arm, initial encounter: Secondary | ICD-10-CM | POA: Diagnosis not present

## 2019-09-03 DIAGNOSIS — F0390 Unspecified dementia without behavioral disturbance: Secondary | ICD-10-CM | POA: Diagnosis present

## 2019-09-03 DIAGNOSIS — S62101S Fracture of unspecified carpal bone, right wrist, sequela: Secondary | ICD-10-CM

## 2019-09-03 DIAGNOSIS — S3993XA Unspecified injury of pelvis, initial encounter: Secondary | ICD-10-CM | POA: Diagnosis not present

## 2019-09-03 DIAGNOSIS — S62101A Fracture of unspecified carpal bone, right wrist, initial encounter for closed fracture: Secondary | ICD-10-CM | POA: Diagnosis not present

## 2019-09-03 DIAGNOSIS — R0902 Hypoxemia: Secondary | ICD-10-CM | POA: Diagnosis not present

## 2019-09-03 DIAGNOSIS — M4312 Spondylolisthesis, cervical region: Secondary | ICD-10-CM | POA: Diagnosis not present

## 2019-09-03 DIAGNOSIS — Y92009 Unspecified place in unspecified non-institutional (private) residence as the place of occurrence of the external cause: Secondary | ICD-10-CM

## 2019-09-03 DIAGNOSIS — R279 Unspecified lack of coordination: Secondary | ICD-10-CM | POA: Diagnosis not present

## 2019-09-03 DIAGNOSIS — N179 Acute kidney failure, unspecified: Secondary | ICD-10-CM | POA: Diagnosis not present

## 2019-09-03 DIAGNOSIS — W19XXXA Unspecified fall, initial encounter: Secondary | ICD-10-CM

## 2019-09-03 DIAGNOSIS — S32501A Unspecified fracture of right pubis, initial encounter for closed fracture: Secondary | ICD-10-CM | POA: Diagnosis not present

## 2019-09-03 DIAGNOSIS — I739 Peripheral vascular disease, unspecified: Secondary | ICD-10-CM | POA: Diagnosis not present

## 2019-09-03 DIAGNOSIS — S3282XA Multiple fractures of pelvis without disruption of pelvic ring, initial encounter for closed fracture: Secondary | ICD-10-CM

## 2019-09-03 LAB — URINALYSIS, COMPLETE (UACMP) WITH MICROSCOPIC
Bilirubin Urine: NEGATIVE
Glucose, UA: NEGATIVE mg/dL
Hgb urine dipstick: NEGATIVE
Ketones, ur: NEGATIVE mg/dL
Nitrite: POSITIVE — AB
Protein, ur: 100 mg/dL — AB
Specific Gravity, Urine: 1.019 (ref 1.005–1.030)
WBC, UA: >50 WBC/hpf — ABNORMAL HIGH (ref 0–5)
pH: 5 (ref 5.0–8.0)

## 2019-09-03 LAB — BASIC METABOLIC PANEL
Anion gap: 13 (ref 5–15)
BUN: 54 mg/dL — ABNORMAL HIGH (ref 8–23)
CO2: 28 mmol/L (ref 22–32)
Calcium: 9.6 mg/dL (ref 8.9–10.3)
Chloride: 98 mmol/L (ref 98–111)
Creatinine, Ser: 1.54 mg/dL — ABNORMAL HIGH (ref 0.44–1.00)
GFR calc Af Amer: 33 mL/min — ABNORMAL LOW (ref >60)
GFR calc non Af Amer: 28 mL/min — ABNORMAL LOW (ref >60)
Glucose, Bld: 98 mg/dL (ref 70–99)
Potassium: 4.3 mmol/L (ref 3.5–5.1)
Sodium: 139 mmol/L (ref 135–145)

## 2019-09-03 LAB — CBC WITH DIFFERENTIAL/PLATELET
Abs Immature Granulocytes: 0.04 10*3/uL (ref 0.00–0.07)
Basophils Absolute: 0 10*3/uL (ref 0.0–0.1)
Basophils Relative: 0 %
Eosinophils Absolute: 0.1 10*3/uL (ref 0.0–0.5)
Eosinophils Relative: 2 %
HCT: 32.7 % — ABNORMAL LOW (ref 36.0–46.0)
Hemoglobin: 10 g/dL — ABNORMAL LOW (ref 12.0–15.0)
Immature Granulocytes: 1 %
Lymphocytes Relative: 16 %
Lymphs Abs: 1.4 10*3/uL (ref 0.7–4.0)
MCH: 31.2 pg (ref 26.0–34.0)
MCHC: 30.6 g/dL (ref 30.0–36.0)
MCV: 101.9 fL — ABNORMAL HIGH (ref 80.0–100.0)
Monocytes Absolute: 1.1 10*3/uL — ABNORMAL HIGH (ref 0.1–1.0)
Monocytes Relative: 13 %
Neutro Abs: 6 10*3/uL (ref 1.7–7.7)
Neutrophils Relative %: 68 %
Platelets: 107 10*3/uL — ABNORMAL LOW (ref 150–400)
RBC: 3.21 MIL/uL — ABNORMAL LOW (ref 3.87–5.11)
RDW: 14.5 % (ref 11.5–15.5)
WBC: 8.7 10*3/uL (ref 4.0–10.5)
nRBC: 0 % (ref 0.0–0.2)

## 2019-09-03 MED ORDER — MORPHINE SULFATE (PF) 2 MG/ML IV SOLN
1.0000 mg | INTRAVENOUS | Status: DC | PRN
  Administered 2019-09-05: 2 mg via INTRAVENOUS
  Filled 2019-09-03 (×2): qty 1

## 2019-09-03 MED ORDER — SERTRALINE HCL 50 MG PO TABS
50.0000 mg | ORAL_TABLET | Freq: Every day | ORAL | Status: DC
  Administered 2019-09-04 – 2019-09-05 (×2): 50 mg via ORAL
  Filled 2019-09-03 (×2): qty 1

## 2019-09-03 MED ORDER — TRAMADOL HCL 50 MG PO TABS
50.0000 mg | ORAL_TABLET | Freq: Four times a day (QID) | ORAL | Status: DC | PRN
  Administered 2019-09-05: 50 mg via ORAL
  Filled 2019-09-03: qty 1

## 2019-09-03 MED ORDER — MAGNESIUM HYDROXIDE 400 MG/5ML PO SUSP: 30.0000 mL | Freq: Every day | ORAL | Status: DC | PRN

## 2019-09-03 MED ORDER — ONDANSETRON HCL 4 MG/2ML IJ SOLN: 4.0000 mg | Freq: Four times a day (QID) | INTRAMUSCULAR | Status: DC | PRN

## 2019-09-03 MED ORDER — ACETAMINOPHEN 325 MG PO TABS: 650.0000 mg | ORAL_TABLET | Freq: Four times a day (QID) | ORAL | Status: DC | PRN

## 2019-09-03 MED ORDER — LABETALOL HCL 5 MG/ML IV SOLN
20.0000 mg | INTRAVENOUS | Status: DC | PRN
  Administered 2019-09-05: 20 mg via INTRAVENOUS
  Filled 2019-09-03: qty 4

## 2019-09-03 MED ORDER — ONDANSETRON HCL 4 MG PO TABS: 4.0000 mg | ORAL_TABLET | Freq: Four times a day (QID) | ORAL | Status: DC | PRN

## 2019-09-03 MED ORDER — SODIUM CHLORIDE 0.9 % IV SOLN: INTRAVENOUS | Status: DC

## 2019-09-03 MED ORDER — NITROGLYCERIN 0.4 MG SL SUBL: 0.4000 mg | SUBLINGUAL_TABLET | SUBLINGUAL | Status: DC | PRN

## 2019-09-03 MED ORDER — RAMIPRIL 5 MG PO CAPS: 5.0000 mg | ORAL_CAPSULE | Freq: Every day | ORAL | Status: DC

## 2019-09-03 MED ORDER — ENOXAPARIN SODIUM 40 MG/0.4ML ~~LOC~~ SOLN: 40.0000 mg | SUBCUTANEOUS | Status: DC

## 2019-09-03 MED ORDER — TRAZODONE HCL 50 MG PO TABS: 25.0000 mg | ORAL_TABLET | Freq: Every evening | ORAL | Status: DC | PRN

## 2019-09-03 MED ORDER — ACETAMINOPHEN 650 MG RE SUPP: 650.0000 mg | Freq: Four times a day (QID) | RECTAL | Status: DC | PRN

## 2019-09-03 MED ORDER — SENNOSIDES-DOCUSATE SODIUM 8.6-50 MG PO TABS
1.0000 | ORAL_TABLET | Freq: Every day | ORAL | Status: DC
  Administered 2019-09-04 – 2019-09-05 (×2): 1 via ORAL
  Filled 2019-09-03 (×2): qty 1

## 2019-09-03 MED ORDER — ISOSORBIDE DINITRATE 20 MG PO TABS
10.0000 mg | ORAL_TABLET | Freq: Two times a day (BID) | ORAL | Status: DC
  Administered 2019-09-04 – 2019-09-05 (×3): 10 mg via ORAL
  Filled 2019-09-03 (×4): qty 0.5
  Filled 2019-09-03 (×2): qty 1
  Filled 2019-09-03: qty 0.5

## 2019-09-03 MED ORDER — LIDOCAINE-EPINEPHRINE 2 %-1:100000 IJ SOLN
20.0000 mL | Freq: Once | INTRAMUSCULAR | Status: AC
  Administered 2019-09-03: 20 mL via INTRADERMAL
  Filled 2019-09-03: qty 1

## 2019-09-03 MED ORDER — CALCIUM CARBONATE-VITAMIN D 500-200 MG-UNIT PO TABS
1.0000 | ORAL_TABLET | Freq: Two times a day (BID) | ORAL | Status: DC
  Filled 2019-09-03 (×4): qty 1

## 2019-09-03 MED ORDER — GLUCOSAMINE-CHONDROITIN 250-200 MG PO TABS: 1.0000 | ORAL_TABLET | Freq: Two times a day (BID) | ORAL | Status: DC

## 2019-09-03 MED ORDER — LIDOCAINE-EPINEPHRINE 1 %-1:100000 IJ SOLN
10.0000 mL | Freq: Once | INTRAMUSCULAR | Status: DC
  Filled 2019-09-03 (×2): qty 10

## 2019-09-03 MED ORDER — FOSFOMYCIN TROMETHAMINE 3 G PO PACK
3.0000 g | PACK | Freq: Once | ORAL | Status: AC
  Administered 2019-09-03: 3 g via ORAL
  Filled 2019-09-03: qty 3

## 2019-09-03 MED ORDER — ADULT MULTIVITAMIN W/MINERALS CH
ORAL_TABLET | Freq: Every day | ORAL | Status: DC
  Administered 2019-09-04 – 2019-09-05 (×2): 1 via ORAL
  Filled 2019-09-03 (×2): qty 1

## 2019-09-03 MED ORDER — FERROUS SULFATE 325 (65 FE) MG PO TABS
325.0000 mg | ORAL_TABLET | Freq: Two times a day (BID) | ORAL | Status: DC
  Administered 2019-09-04 – 2019-09-05 (×2): 325 mg via ORAL
  Filled 2019-09-03 (×3): qty 1

## 2019-09-03 MED ORDER — MELATONIN 5 MG PO TABS
5.0000 mg | ORAL_TABLET | Freq: Every day | ORAL | Status: DC
  Administered 2019-09-04: 5 mg via ORAL
  Filled 2019-09-03 (×4): qty 1

## 2019-09-03 MED ORDER — ACETAMINOPHEN 500 MG PO TABS
1000.0000 mg | ORAL_TABLET | Freq: Once | ORAL | Status: AC
  Administered 2019-09-03: 1000 mg via ORAL
  Filled 2019-09-03: qty 2

## 2019-09-03 MED ORDER — HEPARIN SODIUM (PORCINE) 5000 UNIT/ML IJ SOLN
5000.0000 [IU] | Freq: Three times a day (TID) | INTRAMUSCULAR | Status: DC
  Administered 2019-09-04 – 2019-09-05 (×5): 5000 [IU] via SUBCUTANEOUS
  Filled 2019-09-03 (×5): qty 1

## 2019-09-03 NOTE — ED Triage Notes (Addendum)
Pt from [facility]. Pt arrives with CC unwitnessed fall, found in the floor by a [facility] staff member. Pt st getting out of bed and unable to recall events at this time. Per [facility] laceration to back of head, bleeding controlled.Denies blood thinners.  Dx COVID + November 2020. Arrives on 2L [place] chronic oxygen at SNF.EDP Isaacs at bedside.

## 2019-09-03 NOTE — ED Notes (Signed)
Cast on R/arm present upon arrival.

## 2019-09-03 NOTE — ED Notes (Signed)
Patient transported to CT 

## 2019-09-03 NOTE — ED Notes (Signed)
Resting quietly at this time, no acute distress noted. °

## 2019-09-03 NOTE — ED Notes (Signed)
Returned from CT.

## 2019-09-03 NOTE — H&P (Addendum)
[facility] [place] NAME: Kayla Galloway    MR#:  [id number]  DATE OF BIRTH:  November 02, 1920  DATE OF ADMISSION:  09/03/2019  PRIMARY CARE PHYSICIAN: [name], MD   REQUESTING/REFERRING PHYSICIAN: Lysbeth Galas, MD CHIEF COMPLAINT:   Chief Complaint  Patient presents with  . Fall    HISTORY OF PRESENT ILLNESS:  Kayla Galloway  is a 86 y.o. female with a known history of multiple medical problems are mentioned below, who presented to the emergency room with acute onset of accidental mechanical fall.  She stated that she lost her balance.  She denies any headache or dizziness or blurred vision, presyncope or syncope.  No paresthesias or focal muscle weakness.  No vertigo or tinnitus.  She admits to dysuria with no urinary frequency or urgency or flank pain and without hematuria.  No fever or chills.  No nausea or vomiting or abdominal pain.  Upon presentation emergency room, blood pressure was 155/60 with pulse oximetry of 99% on 2 L of O2 by nasal cannula.  Labs revealed positive urinalysis for UTI and a CMP remarkable for a BUN of 54 and creatinine 1.54 compared to 36 and 1.13 in November of last year.  Hemoglobin hematocrit were 10 and 32.7 compared to 9.1 and 28.3 then with macrocytosis and thrombocytopenia of 107 which are stable.  This x-ray showed streaky left basal opacity that may represent atelectasis or infiltrate.  It also showed multiple vertebral body compression deformities without definite new acute osseous abnormality identified.  Right femur and pelvic x-ray showed no fractures.  Right wrist x-ray showed stable alignment of impacted fracture of the distal radius with intra-articular extension to the radiocarpal joint with some minimal callus formation though difficult to assess.  Right hip CT scan revealed acute minimally displaced fracture through the right superior pubic ramus at site of prior fracture with partially visualized acute nondisplaced  fracture involving the right sacral ala with fracture line extending into the right sacral foramina at approximately S4 and chronic ununited fracture deformities of the right superior and inferior pubic rami and intact proximal right femur.  The patient was given 1 g p.o. Tylenol and 3 g p.o. fosfomycin for her UTI.  She will be admitted to an observation medical bed for further evaluation and management. PAST MEDICAL HISTORY:   Past Medical History:  Diagnosis Date  . Anemia   . Arthritis   . Cancer ([place])    skin ca lesion of scalp- removed  . Depression   . DVT (deep venous thrombosis), left    bilateral, IVC filter 2010  . GERD (gastroesophageal reflux disease)   . Gout   . Hypercholesterolemia   . Hyperkalemia   . Hypertension    Dr. [name]  . Nephrolithiasis   . Obesity   . Osteoporosis   . Peripheral vascular disease ([place])   . 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 Spider veins     PAST SURGICAL HISTORY:   Past Surgical History:  Procedure Laterality Date  . CARDIAC CATHETERIZATION     2010 Does not see a cardiac  doctor  . Colonsopy    . DENTAL SURGERY    . ECTOPIC PREGNANCY SURGERY    . EYE SURGERY Bilateral 2011,2012   cataracts  . FRACTURE SURGERY  2009   hip, rod from knee to hip  .  HEMORRHOID SURGERY    . IVC filter Left    pt states it has turned side ways and could not be removed.  Marland Kitchen KYPHOPLASTY  09/03/2012   per patient, she has had kypho x 2:  Surgeon: Winfield Cunas, MD;  Location: [place] [facility];  Service: Neurosurgery;  Laterality: N/A;  Thoracic eight Kyphoplasty  . OPEN REDUCTION INTERNAL FIXATION (ORIF) DISTAL RADIAL FRACTURE Left 09/06/2015   Procedure: OPEN REDUCTION INTERNAL FIXATION (ORIF) DISTAL RADIAL FRACTURE;  Surgeon: Hessie Knows, MD;  Location: [facility] ORS;  Service: Orthopedics;  Laterality: Left;  . RIGHT OOPHORECTOMY     partial-   . SKIN CANCER EXCISION     top of head  . TONSILLECTOMY     age 74  . VERTEBROPLASTY  12/31/2011   Procedure: VERTEBROPLASTY;  Surgeon: Winfield Cunas, MD;  Location: [place] [facility];  Service: Neurosurgery;  Laterality: N/A;  Thoracic eleven vertebroplasty    SOCIAL HISTORY:   Social History   Tobacco Use  . Smoking status: Never Smoker  . Smokeless tobacco: Never Used  Substance Use Topics  . Alcohol use: No    Alcohol/week: 0.0 standard drinks    FAMILY HISTORY:   Family History  Problem Relation Age of Onset  . Heart disease Father        myocardial infarction  . Heart disease Brother        myocardial infarction  . Lymphoma Sister   . Anesthesia problems Neg Hx   . Breast cancer Neg Hx   . Colon cancer Neg Hx     DRUG ALLERGIES:   Allergies  Allergen Reactions  . Cefuroxime Axetil Rash    Pt states that she does fine with Keflex.    . Doxycycline Nausea Only and Other (See Comments)    Reaction:  Weight loss   . Advil [Ibuprofen] Swelling  . Celebrex [Celecoxib] Nausea Only  . Daypro [Oxaprozin] Other (See Comments)    Reaction:  Dizziness   . Etodolac Nausea Only  . Macrobid [employer] Macro] Other (See Comments)    Reaction:  Burning   . Penicillins Other (See Comments)    Reaction:  Burning   . Percocet [Oxycodone-Acetaminophen] Nausea Only and Swelling  . Tramadol Other (See Comments)    Reaction:  Unknown   . Valium [Diazepam] Other (See Comments)    Reaction:  Dizziness    . Neosporin [Neomycin-Bacitracin Zn-Polymyx] Rash  . Vicodin [Hydrocodone-Acetaminophen] Nausea Only    REVIEW OF SYSTEMS:   ROS As per history of present illness. All pertinent systems were reviewed above. Constitutional,  HEENT, cardiovascular, respiratory, GI, GU, musculoskeletal, neuro, psychiatric, endocrine,  integumentary and hematologic systems were reviewed and are otherwise  negative/unremarkable except for positive findings mentioned above in the  HPI.   MEDICATIONS AT HOME:   Prior to Admission medications   Medication Sig Start Date End Date Taking? Authorizing Provider  Calcium 600-200 MG-UNIT tablet Take 1 tablet by mouth 2 (two) times daily.    Yes [provider]  ferrous sulfate 325 (65 FE) MG tablet Take 325 mg by mouth 2 (two) times daily.    Yes [provider]  Glucosamine-Chondroitin 250-200 MG TABS Take 1 tablet by mouth 2 (two) times daily.    Yes [provider]  isosorbide dinitrate (ISORDIL) 10 MG tablet Take 10 mg by mouth 2 (two) times daily.   Yes [provider]  Melatonin 5 MG TABS Take 5 mg by mouth at bedtime.  Yes [provider]  Multiple Vitamins-Minerals (CENTRUM SILVER ADULT 50+ PO) Take 1 tablet by mouth daily.    Yes [provider]  ramipril (ALTACE) 2.5 MG capsule TAKE 1 CAPSULE BY MOUTH ONCE DAILY Patient taking differently: Take 5 mg by mouth daily.  09/10/18  Yes [name], MD  sertraline (ZOLOFT) 50 MG tablet TAKE 1 TABLET BY MOUTH ONCE DAILY Patient taking differently: Take 50 mg by mouth daily.  10/18/18  Yes [name], MD  acetaminophen (TYLENOL) 650 MG CR tablet Take 650 mg by mouth every 4 (four) hours as needed for pain. Do not exceed 3 grams of APAP/day from all sources    [provider]  nitroGLYCERIN (NITROSTAT) 0.4 MG SL tablet Place 0.4 mg under the tongue every 5 (five) minutes as needed for chest pain.    [provider]  ondansetron (ZOFRAN) 4 MG tablet Take 4 mg by mouth every 6 (six) hours as needed for nausea or vomiting.    [provider]  senna-docusate (SENOKOT-S) 8.6-50 MG tablet Take 1 tablet by mouth daily.    [provider]  traMADol (ULTRAM) 50 MG tablet Take 1 tablet (50 mg total) by mouth every 6 (six) hours as needed for moderate pain. 06/22/19   Fritzi Mandes, MD      VITAL SIGNS:  Blood pressure (!) 147/63, pulse 60, temperature 98.3 F (36.8 C), temperature source  Oral, resp. rate 18, height 5' (1.524 m), weight 54.4 kg, SpO2 98 %.  PHYSICAL EXAMINATION:  Physical Exam  GENERAL:  86 y.o.-year-old pleasant Caucasian female patient lying in the bed with no acute distress.  EYES: Pupils equal, round, reactive to light and accommodation. No scleral icterus. Extraocular muscles intact.  HEENT: Head atraumatic, normocephalic. Oropharynx and nasopharynx clear.  NECK:  Supple, no jugular venous distention. No thyroid enlargement, no tenderness.  LUNGS: Normal breath sounds bilaterally, no wheezing, rales,rhonchi or crepitation. No use of accessory muscles of respiration.  CARDIOVASCULAR: Regular rate and rhythm, S1, S2 normal. No murmurs, rubs, or gallops.  ABDOMEN: Soft, nondistended with mild suprapubic tenderness without rebound tenderness guarding rigidity.  Bowel sounds present. No organomegaly or mass.  EXTREMITIES: No pedal edema, cyanosis, or clubbing.  NEUROLOGIC: Cranial nerves II through XII are intact. Muscle strength 5/5 in all extremities. Sensation intact. Gait not checked. Musculoskeletal: She had right groin tenderness. PSYCHIATRIC: The patient is alert and oriented x 3.  Normal affect and good eye contact. SKIN: No obvious rash, lesion, or ulcer.   LABORATORY PANEL:   CBC Recent Labs  Lab 09/03/19 1606  WBC 8.7  HGB 10.0*  HCT 32.7*  PLT 107*   ------------------------------------------------------------------------------------------------------------------  Chemistries  Recent Labs  Lab 09/03/19 1606  NA 139  K 4.3  CL 98  CO2 28  GLUCOSE 98  BUN 54*  CREATININE 1.54*  CALCIUM 9.6   ------------------------------------------------------------------------------------------------------------------  Cardiac Enzymes No results for input(s): TROPONINI in the last 168 hours. ------------------------------------------------------------------------------------------------------------------  RADIOLOGY:  DG Chest 1  View  Result Date: 09/03/2019 CLINICAL DATA:  Fall EXAM: CHEST  1 VIEW COMPARISON:  03/22/2018 FINDINGS: Examination is somewhat limited on supine rotated view. Heart size appears stable. Aorta is calcified and tortuous. There is a streaky left basilar opacity. No pneumothorax is evident. Multilevel vertebroplasty at T8, T11, and L2. There are additional compression fractures again noted involving T7, T10, and L1. Upper thoracic levels are not well evaluated. No definite new or worsened compression fracture on single frontal view. IVC filter is noted.  IMPRESSION: 1. Streaky left basilar opacity which may represent atelectasis or infiltrate. 2. Multiple vertebral body compression deformities without definite new acute osseous abnormality identified on provided view. Electronically Signed   By: Davina Poke D.O.   On: 09/03/2019 17:22   DG Pelvis 1-2 Views  Result Date: 09/03/2019 CLINICAL DATA:  86 year old female with fall and right hip pain. EXAM: RIGHT FEMUR 2 VIEWS; PELVIS - 1-2 VIEW COMPARISON:  Pelvic radiograph dated 08/11/2008. FINDINGS: Old appearing bilateral pubic bone fractures. The right pubic bone fracture is displaced with nonunion. No acute femoral fracture. There is no dislocation. The bones are osteopenic. Atherosclerotic calcification of the vasculature. Prior internal fixation of left femoral neck fracture. And IVC filter noted. Lower lumbar vertebroplasty. The soft tissues are unremarkable. IMPRESSION: No definite acute fracture or dislocation. Electronically Signed   By: Anner Crete M.D.   On: 09/03/2019 17:19   CT Head Wo Contrast  Result Date: 09/03/2019 CLINICAL DATA:  Status post unwitnessed fall. EXAM: CT HEAD WITHOUT CONTRAST TECHNIQUE: Contiguous axial images were obtained from the base of the skull through the vertex without intravenous contrast. COMPARISON:  June 20, 2019 FINDINGS: Brain: No evidence of acute infarction, hemorrhage, hydrocephalus, extra-axial  collection or mass lesion/mass effect. Moderate brain parenchymal volume loss and deep white matter microangiopathy. Vascular: Calcific atherosclerotic disease. Skull: Normal. Negative for fracture or focal lesion. Sinuses/Orbits: Partial opacification of the left maxillary and left sphenoid sinuses. Other: Right parietal scalp hematoma. IMPRESSION: 1. No acute intracranial abnormality. 2. Atrophy, chronic microvascular disease. Electronically Signed   By: Fidela Salisbury M.D.   On: 09/03/2019 16:55   CT Cervical Spine Wo Contrast  Result Date: 09/03/2019 CLINICAL DATA:  Status post unwitnessed fall. EXAM: CT CERVICAL SPINE WITHOUT CONTRAST TECHNIQUE: Multidetector CT imaging of the cervical spine was performed without intravenous contrast. Multiplanar CT image reconstructions were also generated. COMPARISON:  June 20, 2019 FINDINGS: Alignment: Stable 3.5 anterolisthesis of C3 on C4. Stable 2 mm retrolisthesis of C4 on C5. Stable 1-2 mm retrolisthesis of C5 on C6. Skull base and vertebrae: No acute fracture. No primary bone lesion or focal pathologic process. Soft tissues and spinal canal: No prevertebral fluid or swelling. No visible canal hematoma. Disc levels: Multilevel osteoarthritic changes of the cervical spine with degenerative disc disease and posterior facet arthropathy. Upper chest: Subpleural scarring/fibrotic changes. Other: None. IMPRESSION: 1. No evidence of acute traumatic injury to the cervical spine. 2. Stable multilevel osteoarthritic changes of the cervical spine with stable anterolisthesis of C3 on C4 and retrolisthesis of C4 on C5 and C5 on C6. Electronically Signed   By: Fidela Salisbury M.D.   On: 09/03/2019 17:07   CT Hip Right Wo Contrast  Result Date: 09/03/2019 CLINICAL DATA:  Right hip pain after fall EXAM: CT OF THE RIGHT HIP WITHOUT CONTRAST TECHNIQUE: Multidetector CT imaging of the right hip was performed according to the standard protocol. Multiplanar CT image  reconstructions were also generated. COMPARISON:  Same-day x-ray. CT 08/05/2012 FINDINGS: Bones/Joint/Cartilage Chronic ununited fracture deformities of the right superior and inferior pubic rami with well corticated margins. There is an acute, minimally displaced fracture through the right superior pubic ramus at site of prior fracture (series 2, images 39-45; series 7, image 85). No definite acute fracture component at the inferior pubic ramus site. Partially visualized at the edge of the field of view is an acute nondisplaced fracture involving the right sacrum with fracture line extending into the right sacral foramina at approximately S4 (series 6, images  52-54). The visualized portion of the inferior right SI joint is intact without diastasis. The proximal right femur is intact. Hip joint alignment is maintained without dislocation. Mild degenerative changes of the right hip. Moderate degenerative changes of the pubic symphysis with chondrocalcinosis. No pubic symphysis diastasis. Visualized portion of the right SI joint is intact without diastasis. Ligaments Suboptimally assessed by CT. Muscles and Tendons No musculotendinous abnormality within the limitations of CT. Soft tissues No soft tissue fluid collection. No large right pelvic sidewall hematoma. Extensive atherosclerotic calcification. IMPRESSION: 1. Acute, minimally displaced fracture through the right superior pubic ramus at site of prior fracture. No definite acute fracture component at the inferior pubic ramus site. 2. Partially visualized acute nondisplaced fracture involving the right sacral ala with fracture line extending into the right sacral foramina at approximately S4. 3. Chronic ununited fracture deformities of the right superior and inferior pubic rami. 4. Proximal right femur intact without fracture or dislocation. Electronically Signed   By: Davina Poke D.O.   On: 09/03/2019 19:44   DG Femur Min 2 Views Right  Result Date:  09/03/2019 CLINICAL DATA:  86 year old female with fall and right hip pain. EXAM: RIGHT FEMUR 2 VIEWS; PELVIS - 1-2 VIEW COMPARISON:  Pelvic radiograph dated 08/11/2008. FINDINGS: Old appearing bilateral pubic bone fractures. The right pubic bone fracture is displaced with nonunion. No acute femoral fracture. There is no dislocation. The bones are osteopenic. Atherosclerotic calcification of the vasculature. Prior internal fixation of left femoral neck fracture. And IVC filter noted. Lower lumbar vertebroplasty. The soft tissues are unremarkable. IMPRESSION: No definite acute fracture or dislocation. Electronically Signed   By: Anner Crete M.D.   On: 09/03/2019 17:19      IMPRESSION AND PLAN:   1.  Pelvic fracture involving the right superior pubic ramus as well as right sacral ala, both impacted secondary to recurrent mechanical fall.  The patient will be admitted to in observation medical bed for pain management.  She will be placed on as needed IV morphine sulfate and p.o. Percocet.  Physical therapy consult to be obtained.  2.  Recent right wrist fracture.  She is wearing a cast that was apparently supposed to be removed.  An orthopedic follow-up will be obtained by Dr. Posey Pronto who was notified about the patient.  3.  UTI.  Patient will be placed on IV Rocephin.  Will follow urine culture and sensitivity.  4.  Acute kidney injury.  This likely prerenal secondary to dehydration.  Will hydrate with IV normal saline follow BMP.  We will hold off ACE inhibitor therapy.  5.  Hypertension.  We will hold off ACE inhibitor therapy due to acute kidney injury.  6.  DVT prophylaxis.  Subcutaneous Lovenox    All the records are reviewed and case discussed with ED provider. The plan of care was discussed in details with the patient (and family). I answered all questions. The patient agreed to proceed with the above mentioned plan. Further management will depend upon hospital course.   CODE  STATUS: The patient is DNR/DNI.  She has an out of facility DNR form.  TOTAL TIME TAKING CARE OF THIS PATIENT: 55 minutes.    Christel Mormon M.D on 09/03/2019 at 9:10 PM  [facility]   From 7 PM-7 AM, contact night-coverage [email or web address]  CC: Primary care physician; [name], MD   Note: This dictation was prepared with Dragon dictation along with smaller phrase technology. Any transcriptional errors that result from this process are  unintentional. 

## 2019-09-03 NOTE — ED Notes (Signed)
This RN spoke with lab regarding blood results. Lab st they have blood specimen and will be process shortly.

## 2019-09-03 NOTE — ED Provider Notes (Signed)
[facility] Emergency Department Provider Note  ____________________________________________   First MD Initiated Contact with Patient 09/03/19 1543     (approximate)  I have reviewed the triage vital signs and the nursing notes.   HISTORY  Chief Complaint Fall    HPI Kayla Galloway is a 86 y.o. female  With h/o DVT, HTN, PVD, here with fall. History somewhat limited 2/2 dementia. Pt states that she believes she was getting up to get a wheelchair when she fell. Fall was not witnessed. She was found on the ground with laceration to the back of her head. She reports she has mild posterior headache, as well as mild aching R hip pain. Pain is worse w/ movement, palpation. No alleviating factors. No other complaints. No recent med changes.  Level 5 caveat invoked as remainder of history, ROS, and physical exam limited due to patient's dementia.        Past Medical History:  Diagnosis Date  . Anemia   . Arthritis   . Cancer ([place])    skin ca lesion of scalp- removed  . Depression   . DVT (deep venous thrombosis), left    bilateral, IVC filter 2010  . GERD (gastroesophageal reflux disease)   . Gout   . Hypercholesterolemia   . Hyperkalemia   . Hypertension    Dr. [name]  . Nephrolithiasis   . Obesity   . Osteoporosis   . Peripheral vascular disease ([place])   . 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 Spider veins     Patient Active Problem List   Diagnosis Date Noted  . Pelvic fracture ([place]) 09/03/2019  . Hematoma   . CHF (congestive heart failure) ([place]) 06/21/2019  . Closed fracture of right distal radius 06/21/2019  . Fall 06/21/2019  . Iron deficiency anemia 06/21/2019  . Palliative care encounter   . Syncope 06/20/2019  . Depression   . CAD (coronary artery disease)   . Pulmonary emphysema ([place])   . Acute renal failure  superimposed on stage 3a chronic kidney disease ([place])   . Encounter for completion of form with patient 09/26/2018  . Hypoxia 07/21/2018  . Abnormal CXR 07/21/2018  . PAD (peripheral artery disease) ([place]) 04/27/2018  . Fracture of oth parts of pelvis, init for clos fx ([employer]) 03/22/2018  . Back pain 03/25/2017  . Hyponatremia 02/09/2017  . Rib fractures 02/04/2017  . GERD (gastroesophageal reflux disease) 02/04/2017  . Skin lesions 11/09/2016  . Bilateral lower extremity edema 11/04/2016  . S/P IVC filter 06/24/2016  . Weight loss 04/11/2014  . Anemia 06/28/2012  . Hypertension 06/28/2012  . Hypercholesterolemia 06/28/2012  . Renal insufficiency 06/28/2012  . Osteoporosis 06/28/2012    Past Surgical History:  Procedure Laterality Date  . CARDIAC CATHETERIZATION     2010 Does not see a cardiac  doctor  . Colonsopy    . DENTAL SURGERY    . ECTOPIC PREGNANCY SURGERY    . EYE SURGERY Bilateral 2011,2012   cataracts  . FRACTURE SURGERY  2009   hip, rod from knee to hip  . HEMORRHOID SURGERY    . IVC filter Left    pt states it has turned side ways and could not be removed.  Kayla Galloway Kitchen KYPHOPLASTY  09/03/2012   per patient, she has had kypho x 2:  Surgeon: Winfield Cunas, MD;  Location: [place]  ORS;  Service: Neurosurgery;  Laterality: N/A;  Thoracic eight Kyphoplasty  . OPEN REDUCTION INTERNAL FIXATION (ORIF) DISTAL RADIAL FRACTURE Left 09/06/2015   Procedure: OPEN REDUCTION INTERNAL FIXATION (ORIF) DISTAL RADIAL FRACTURE;  Surgeon: Hessie Knows, MD;  Location: [facility] ORS;  Service: Orthopedics;  Laterality: Left;  . RIGHT OOPHORECTOMY     partial-  . SKIN CANCER EXCISION     top of head  . TONSILLECTOMY     age 86  . VERTEBROPLASTY  12/31/2011   Procedure: VERTEBROPLASTY;  Surgeon: Winfield Cunas, MD;  Location: [place] NEURO ORS;  Service: Neurosurgery;  Laterality: N/A;  Thoracic eleven vertebroplasty    Prior to Admission medications   Medication Sig Start Date End Date Taking?  Authorizing Provider  Calcium 600-200 MG-UNIT tablet Take 1 tablet by mouth 2 (two) times daily.    Yes [provider]  ferrous sulfate 325 (65 FE) MG tablet Take 325 mg by mouth 2 (two) times daily.    Yes [provider]  Glucosamine-Chondroitin 250-200 MG TABS Take 1 tablet by mouth 2 (two) times daily.    Yes [provider]  isosorbide dinitrate (ISORDIL) 10 MG tablet Take 10 mg by mouth 2 (two) times daily.   Yes [provider]  Melatonin 5 MG TABS Take 5 mg by mouth at bedtime.    Yes [provider]  Multiple Vitamins-Minerals (CENTRUM SILVER ADULT 50+ PO) Take 1 tablet by mouth daily.    Yes [provider]  ramipril (ALTACE) 2.5 MG capsule TAKE 1 CAPSULE BY MOUTH ONCE DAILY Patient taking differently: Take 5 mg by mouth daily.  09/10/18  Yes [name], MD  sertraline (ZOLOFT) 50 MG tablet TAKE 1 TABLET BY MOUTH ONCE DAILY Patient taking differently: Take 50 mg by mouth daily.  10/18/18  Yes [name], MD  acetaminophen (TYLENOL) 650 MG CR tablet Take 650 mg by mouth every 4 (four) hours as needed for pain. Do not exceed 3 grams of APAP/day from all sources    [provider]  nitroGLYCERIN (NITROSTAT) 0.4 MG SL tablet Place 0.4 mg under the tongue every 5 (five) minutes as needed for chest pain.    [provider]  ondansetron (ZOFRAN) 4 MG tablet Take 4 mg by mouth every 6 (six) hours as needed for nausea or vomiting.    [provider]  senna-docusate (SENOKOT-S) 8.6-50 MG tablet Take 1 tablet by mouth daily.    [provider]  traMADol (ULTRAM) 50 MG tablet Take 1 tablet (50 mg total) by mouth every 6 (six) hours as needed for moderate pain. 06/22/19   Fritzi Mandes, MD    Allergies Cefuroxime axetil, Doxycycline, Advil [ibuprofen], Celebrex [celecoxib], Daypro [oxaprozin], Etodolac, Macrobid [nitrofurantoin monohyd macro], Penicillins, Percocet [oxycodone-acetaminophen], Tramadol,  Valium [diazepam], Neosporin [neomycin-bacitracin zn-polymyx], and Vicodin [hydrocodone-acetaminophen]  Family History  Problem Relation Age of Onset  . Heart disease Father        myocardial infarction  . Heart disease Brother        myocardial infarction  . Lymphoma Sister   . Anesthesia problems Neg Hx   . Breast cancer Neg Hx   . Colon cancer Neg Hx     Social History Social History   Tobacco Use  . Smoking status: Never Smoker  . Smokeless tobacco: Never Used  Substance Use Topics  . Alcohol use: No    Alcohol/week: 0.0 standard drinks  . Drug use: No    Review of Systems  Review of Systems  Unable to perform ROS: Dementia     ____________________________________________  PHYSICAL EXAM:      VITAL SIGNS: ED Triage Vitals  Enc Vitals Group     BP      Pulse      Resp      Temp      Temp src      SpO2      Weight      Height      Head Circumference      Peak Flow      Pain Score      Pain Loc      Pain Edu?      Excl. in [place]?      Physical Exam Vitals and nursing note reviewed.  Constitutional:      General: She is not in acute distress.    Appearance: She is well-developed.  HENT:     Head: Normocephalic and atraumatic.     Comments: Approx 2 cm superficial lac to posterior parieto-occipital scalp. No deformity. No periorbital or periauricular ecchymoses. No step-offs. Eyes:     Conjunctiva/sclera: Conjunctivae normal.  Cardiovascular:     Rate and Rhythm: Normal rate and regular rhythm.     Heart sounds: Normal heart sounds.  Pulmonary:     Effort: Pulmonary effort is normal. No respiratory distress.     Breath sounds: No wheezing.  Abdominal:     General: There is no distension.  Musculoskeletal:     Cervical back: Neck supple.     Comments: Moderate TTP over R greater troch and distal femur, with on deformity or shortening. Moderate TTP with log roll R hip.  Skin:    General: Skin is warm.     Capillary Refill: Capillary refill takes  less than 2 seconds.     Findings: No rash.  Neurological:     Mental Status: She is alert and oriented to person, place, and time.     Motor: No abnormal muscle tone.     Comments: Oriented to person, place, year but not exact date. MAE with 5/5 strength. Normal sensation to light touch b/l UE and LE. No CN deficits.       ____________________________________________   LABS (all labs ordered are listed, but only abnormal results are displayed)  Labs Reviewed  CBC WITH DIFFERENTIAL/PLATELET - Abnormal; Notable for the following components:      Result Value   RBC 3.21 (*)    Hemoglobin 10.0 (*)    HCT 32.7 (*)    MCV 101.9 (*)    Platelets 107 (*)    Monocytes Absolute 1.1 (*)    All other components within normal limits  BASIC METABOLIC PANEL - Abnormal; Notable for the following components:   BUN 54 (*)    Creatinine, Ser 1.54 (*)    GFR calc non Af Amer 28 (*)    GFR calc Af Amer 33 (*)    All other components within normal limits  URINALYSIS, COMPLETE (UACMP) WITH MICROSCOPIC - Abnormal; Notable for the following components:   Color, Urine AMBER (*)    APPearance TURBID (*)    Protein, ur 100 (*)    Nitrite POSITIVE (*)    Leukocytes,Ua LARGE (*)    WBC, UA >50 (*)    Bacteria, UA MANY (*)    All other components within normal limits  SARS CORONAVIRUS 2 (TAT 6-24 HRS)  URINE CULTURE  BASIC METABOLIC PANEL  CBC    ____________________________________________  EKG: None ________________________________________  RADIOLOGY All imaging, including plain films, CT scans, and ultrasounds, independently reviewed by me, and interpretations confirmed via formal radiology reads.  ED MD interpretation:   CXR: L basilar atelectasis, no PNA XR Pelvis/femur: no acute fx or abnormality CT Head/C-Spine: NAICA, chronic degen disease CT Hip R: R sacral fx, R superior pubic ramus fx, no femur fx XR Wrist: healing fx, no new injury  Official radiology report(s): DG Chest 1  View  Result Date: 09/03/2019 CLINICAL DATA:  Fall EXAM: CHEST  1 VIEW COMPARISON:  03/22/2018 FINDINGS: Examination is somewhat limited on supine rotated view. Heart size appears stable. Aorta is calcified and tortuous. There is a streaky left basilar opacity. No pneumothorax is evident. Multilevel vertebroplasty at T8, T11, and L2. There are additional compression fractures again noted involving T7, T10, and L1. Upper thoracic levels are not well evaluated. No definite new or worsened compression fracture on single frontal view. IVC filter is noted. IMPRESSION: 1. Streaky left basilar opacity which may represent atelectasis or infiltrate. 2. Multiple vertebral body compression deformities without definite new acute osseous abnormality identified on provided view. Electronically Signed   By: Davina Poke D.O.   On: 09/03/2019 17:22   DG Pelvis 1-2 Views  Result Date: 09/03/2019 CLINICAL DATA:  86 year old female with fall and right hip pain. EXAM: RIGHT FEMUR 2 VIEWS; PELVIS - 1-2 VIEW COMPARISON:  Pelvic radiograph dated 08/11/2008. FINDINGS: Old appearing bilateral pubic bone fractures. The right pubic bone fracture is displaced with nonunion. No acute femoral fracture. There is no dislocation. The bones are osteopenic. Atherosclerotic calcification of the vasculature. Prior internal fixation of left femoral neck fracture. And IVC filter noted. Lower lumbar vertebroplasty. The soft tissues are unremarkable. IMPRESSION: No definite acute fracture or dislocation. Electronically Signed   By: Anner Crete M.D.   On: 09/03/2019 17:19   DG Wrist Complete Right  Result Date: 09/03/2019 CLINICAL DATA:  Radial fracture follow-up EXAM: RIGHT WRIST - COMPLETE 3+ VIEW COMPARISON:  Radiograph 06/20/2019 FINDINGS: Overlying casting material obscures fine bone and soft-tissue detail. Redemonstration of the impacted fracture of the distal radius with intra-articular extension to the radiocarpal joint. Suspect  some minimal callus formation is present though difficult to fully assess given the reduced resolution of the osseous structures. Additional advanced degenerative changes are noted in the wrist and imaged hand most pronounced at the first carpometacarpal joint and triscaphe joints. Stable appearance of a radiodensity projecting over the distal ulna. Chondrocalcinosis again seen in the triangular fibrocartilage. IMPRESSION: Stable alignment of the impacted fracture of the distal radius with intra-articular extension to the radiocarpal joint. Suspect some minimal callus formation is present though difficult to fully assess given the reduced resolution of the osseous structures. Electronically Signed   By: Lovena Le M.D.   On: 09/03/2019 21:31   CT Head Wo Contrast  Result Date: 09/03/2019 CLINICAL DATA:  Status post unwitnessed fall. EXAM: CT HEAD WITHOUT CONTRAST TECHNIQUE: Contiguous axial images were obtained from the base of the skull through the vertex without intravenous contrast. COMPARISON:  June 20, 2019 FINDINGS: Brain: No evidence of acute infarction, hemorrhage, hydrocephalus, extra-axial collection or mass lesion/mass effect. Moderate brain parenchymal volume loss and deep white matter microangiopathy. Vascular: Calcific atherosclerotic disease. Skull: Normal. Negative for fracture or focal lesion. Sinuses/Orbits: Partial opacification of the left maxillary and left sphenoid sinuses. Other: Right parietal scalp hematoma. IMPRESSION: 1. No acute intracranial abnormality. 2. Atrophy, chronic microvascular disease. Electronically Signed   By: Fidela Salisbury M.D.   On:  09/03/2019 16:55   CT Cervical Spine Wo Contrast  Result Date: 09/03/2019 CLINICAL DATA:  Status post unwitnessed fall. EXAM: CT CERVICAL SPINE WITHOUT CONTRAST TECHNIQUE: Multidetector CT imaging of the cervical spine was performed without intravenous contrast. Multiplanar CT image reconstructions were also generated.  COMPARISON:  June 20, 2019 FINDINGS: Alignment: Stable 3.5 anterolisthesis of C3 on C4. Stable 2 mm retrolisthesis of C4 on C5. Stable 1-2 mm retrolisthesis of C5 on C6. Skull base and vertebrae: No acute fracture. No primary bone lesion or focal pathologic process. Soft tissues and spinal canal: No prevertebral fluid or swelling. No visible canal hematoma. Disc levels: Multilevel osteoarthritic changes of the cervical spine with degenerative disc disease and posterior facet arthropathy. Upper chest: Subpleural scarring/fibrotic changes. Other: None. IMPRESSION: 1. No evidence of acute traumatic injury to the cervical spine. 2. Stable multilevel osteoarthritic changes of the cervical spine with stable anterolisthesis of C3 on C4 and retrolisthesis of C4 on C5 and C5 on C6. Electronically Signed   By: Fidela Salisbury M.D.   On: 09/03/2019 17:07   CT Hip Right Wo Contrast  Result Date: 09/03/2019 CLINICAL DATA:  Right hip pain after fall EXAM: CT OF THE RIGHT HIP WITHOUT CONTRAST TECHNIQUE: Multidetector CT imaging of the right hip was performed according to the standard protocol. Multiplanar CT image reconstructions were also generated. COMPARISON:  Same-day x-ray. CT 08/05/2012 FINDINGS: Bones/Joint/Cartilage Chronic ununited fracture deformities of the right superior and inferior pubic rami with well corticated margins. There is an acute, minimally displaced fracture through the right superior pubic ramus at site of prior fracture (series 2, images 39-45; series 7, image 85). No definite acute fracture component at the inferior pubic ramus site. Partially visualized at the edge of the field of view is an acute nondisplaced fracture involving the right sacrum with fracture line extending into the right sacral foramina at approximately S4 (series 6, images 52-54). The visualized portion of the inferior right SI joint is intact without diastasis. The proximal right femur is intact. Hip joint alignment is  maintained without dislocation. Mild degenerative changes of the right hip. Moderate degenerative changes of the pubic symphysis with chondrocalcinosis. No pubic symphysis diastasis. Visualized portion of the right SI joint is intact without diastasis. Ligaments Suboptimally assessed by CT. Muscles and Tendons No musculotendinous abnormality within the limitations of CT. Soft tissues No soft tissue fluid collection. No large right pelvic sidewall hematoma. Extensive atherosclerotic calcification. IMPRESSION: 1. Acute, minimally displaced fracture through the right superior pubic ramus at site of prior fracture. No definite acute fracture component at the inferior pubic ramus site. 2. Partially visualized acute nondisplaced fracture involving the right sacral ala with fracture line extending into the right sacral foramina at approximately S4. 3. Chronic ununited fracture deformities of the right superior and inferior pubic rami. 4. Proximal right femur intact without fracture or dislocation. Electronically Signed   By: Davina Poke D.O.   On: 09/03/2019 19:44   DG Femur Min 2 Views Right  Result Date: 09/03/2019 CLINICAL DATA:  86 year old female with fall and right hip pain. EXAM: RIGHT FEMUR 2 VIEWS; PELVIS - 1-2 VIEW COMPARISON:  Pelvic radiograph dated 08/11/2008. FINDINGS: Old appearing bilateral pubic bone fractures. The right pubic bone fracture is displaced with nonunion. No acute femoral fracture. There is no dislocation. The bones are osteopenic. Atherosclerotic calcification of the vasculature. Prior internal fixation of left femoral neck fracture. And IVC filter noted. Lower lumbar vertebroplasty. The soft tissues are unremarkable. IMPRESSION: No definite acute fracture  or dislocation. Electronically Signed   By: Anner Crete M.D.   On: 09/03/2019 17:19    ____________________________________________  PROCEDURES   Procedure(s) performed (including Critical Care):  Kayla Galloway KitchenMarland KitchenLaceration  Repair  Date/Time: 09/04/2019 1:30 AM Performed by: Duffy Bruce, MD Authorized by: Duffy Bruce, MD   Consent:    Consent obtained:  Verbal   Consent given by:  Patient   Risks discussed:  Infection, need for additional repair, pain, tendon damage, retained foreign body, vascular damage, poor cosmetic result, poor wound healing and nerve damage   Alternatives discussed:  Referral and delayed treatment Anesthesia (see MAR for exact dosages):    Anesthesia method:  Local infiltration   Local anesthetic:  Lidocaine 1% WITH epi Laceration details:    Location:  Scalp   Scalp location:  R parietal   Length (cm):  1 Repair type:    Repair type:  Simple Pre-procedure details:    Preparation:  Patient was prepped and draped in usual sterile fashion and imaging obtained to evaluate for foreign bodies Exploration:    Hemostasis achieved with:  Direct pressure   Wound exploration: wound explored through full range of motion and entire depth of wound probed and visualized   Treatment:    Area cleansed with:  Betadine   Amount of cleaning:  Extensive   Irrigation solution:  Sterile water   Irrigation volume:  500   Irrigation method:  Pressure wash Skin repair:    Repair method:  Staples   Number of staples:  3 Approximation:    Approximation:  Close Post-procedure details:    Dressing:  Antibiotic ointment, non-adherent dressing and sterile dressing   Patient tolerance of procedure:  Tolerated well, no immediate complications    ____________________________________________  INITIAL IMPRESSION / MDM / ASSESSMENT AND PLAN / ED COURSE  As part of my medical decision making, I reviewed the following data within the [facility] notes reviewed and incorporated, Old chart reviewed, Notes from prior ED visits, and [place] Controlled Substance Database       *SOLIMAR RETZER was evaluated in Emergency Department on 09/04/2019 for the symptoms described in the  history of present illness. She was evaluated in the context of the global COVID-19 pandemic, which necessitated consideration that the patient might be at risk for infection with the SARS-CoV-2 virus that causes COVID-19. Institutional protocols and algorithms that pertain to the evaluation of patients at risk for COVID-19 are in a state of rapid change based on information released by regulatory bodies including the CDC and federal and state organizations. These policies and algorithms were followed during the patient's care in the ED.  Some ED evaluations and interventions may be delayed as a result of limited staffing during the pandemic.*     Medical Decision Making:  86 yo F here with fall, R hip pain and scalp lac. Scalp lac repaired. Initial XR neg so CT obtained of R hip which shows nondisplaced sacral ala and minimally displaced R superior pubic ramus fx. R femur w/o fracture. Discussed CT findings with Dr. Posey Pronto of [place] - will see in AM. Of note, per review of notes and discussion w/ HCPOA, pt was due to have her R short arm cast removed in late Dec but was unable to do so 2/2 COVID. Plain films ordered and Dr. Posey Pronto will eval in AM. Labs otherwise reassuring. UA with pyuria, bacteriuria - Fosfo given, no signs of pyelo.   ____________________________________________  FINAL CLINICAL IMPRESSION(S) / ED  DIAGNOSES  Final diagnoses:  Multiple closed fractures of pelvis without disruption of pelvic ring, initial encounter ([place])     MEDICATIONS GIVEN DURING THIS VISIT:  Medications  traMADol (ULTRAM) tablet 50 mg (has no administration in time range)  isosorbide dinitrate (ISORDIL) tablet 10 mg (has no administration in time range)  nitroGLYCERIN (NITROSTAT) SL tablet 0.4 mg (has no administration in time range)  sertraline (ZOLOFT) tablet 50 mg (has no administration in time range)  ondansetron (ZOFRAN) tablet 4 mg (has no administration in time range)  senna-docusate (Senokot-S)  tablet 1 tablet (has no administration in time range)  ferrous sulfate tablet 325 mg (has no administration in time range)  Melatonin TABS 5 mg (has no administration in time range)  calcium-vitamin D (OSCAL WITH D) 500-200 MG-UNIT per tablet 1 tablet (has no administration in time range)  multivitamin with minerals tablet (has no administration in time range)  0.9 %  sodium chloride infusion ( Intravenous New Bag/Given 09/04/19 0024)  acetaminophen (TYLENOL) tablet 650 mg (has no administration in time range)    Or  acetaminophen (TYLENOL) suppository 650 mg (has no administration in time range)  traZODone (DESYREL) tablet 25 mg (has no administration in time range)  magnesium hydroxide (MILK OF MAGNESIA) suspension 30 mL (has no administration in time range)  ondansetron (ZOFRAN) tablet 4 mg (has no administration in time range)    Or  ondansetron (ZOFRAN) injection 4 mg (has no administration in time range)  morphine 2 MG/ML injection 1-2 mg (has no administration in time range)  labetalol (NORMODYNE) injection 20 mg (has no administration in time range)  heparin injection 5,000 Units (has no administration in time range)  lidocaine-EPINEPHrine (XYLOCAINE W/EPI) 2 %-1:100000 (with pres) injection 20 mL (20 mLs Intradermal Given by Other 09/03/19 1820)  acetaminophen (TYLENOL) tablet 1,000 mg (1,000 mg Oral Given 09/03/19 2028)  fosfomycin (MONUROL) packet 3 g (3 g Oral Given 09/03/19 2136)     ED Discharge Orders    None       Note:  This document was prepared using Dragon voice recognition software and may include unintentional dictation errors.   Duffy Bruce, MD 09/04/19 [phone]

## 2019-09-03 NOTE — ED Notes (Signed)
Suture cart at bedside at this time.

## 2019-09-03 NOTE — ED Notes (Signed)
Patient resting quietly at this time.  No acute distress noted. 

## 2019-09-03 NOTE — ED Notes (Signed)
Attempted I&O for urine but unsuccessful.

## 2019-09-03 NOTE — ED Notes (Signed)
Awaiting medication to be send from pharmacy.

## 2019-09-04 DIAGNOSIS — Z20822 Contact with and (suspected) exposure to covid-19: Secondary | ICD-10-CM | POA: Diagnosis present

## 2019-09-04 DIAGNOSIS — N390 Urinary tract infection, site not specified: Secondary | ICD-10-CM | POA: Diagnosis not present

## 2019-09-04 DIAGNOSIS — Y92009 Unspecified place in unspecified non-institutional (private) residence as the place of occurrence of the external cause: Secondary | ICD-10-CM | POA: Diagnosis not present

## 2019-09-04 DIAGNOSIS — S3210XA Unspecified fracture of sacrum, initial encounter for closed fracture: Secondary | ICD-10-CM | POA: Diagnosis present

## 2019-09-04 DIAGNOSIS — I2510 Atherosclerotic heart disease of native coronary artery without angina pectoris: Secondary | ICD-10-CM | POA: Diagnosis present

## 2019-09-04 DIAGNOSIS — S62101S Fracture of unspecified carpal bone, right wrist, sequela: Secondary | ICD-10-CM

## 2019-09-04 DIAGNOSIS — S32110A Nondisplaced Zone I fracture of sacrum, initial encounter for closed fracture: Secondary | ICD-10-CM | POA: Diagnosis present

## 2019-09-04 DIAGNOSIS — Z86718 Personal history of other venous thrombosis and embolism: Secondary | ICD-10-CM | POA: Diagnosis not present

## 2019-09-04 DIAGNOSIS — R0902 Hypoxemia: Secondary | ICD-10-CM | POA: Diagnosis not present

## 2019-09-04 DIAGNOSIS — E860 Dehydration: Secondary | ICD-10-CM | POA: Diagnosis present

## 2019-09-04 DIAGNOSIS — I10 Essential (primary) hypertension: Secondary | ICD-10-CM | POA: Diagnosis present

## 2019-09-04 DIAGNOSIS — Z90721 Acquired absence of ovaries, unilateral: Secondary | ICD-10-CM | POA: Diagnosis not present

## 2019-09-04 DIAGNOSIS — S4991XA Unspecified injury of right shoulder and upper arm, initial encounter: Secondary | ICD-10-CM | POA: Diagnosis not present

## 2019-09-04 DIAGNOSIS — M25511 Pain in right shoulder: Secondary | ICD-10-CM | POA: Diagnosis not present

## 2019-09-04 DIAGNOSIS — M810 Age-related osteoporosis without current pathological fracture: Secondary | ICD-10-CM | POA: Diagnosis present

## 2019-09-04 DIAGNOSIS — S32301D Unspecified fracture of right ilium, subsequent encounter for fracture with routine healing: Secondary | ICD-10-CM | POA: Diagnosis not present

## 2019-09-04 DIAGNOSIS — D649 Anemia, unspecified: Secondary | ICD-10-CM | POA: Diagnosis present

## 2019-09-04 DIAGNOSIS — S0101XA Laceration without foreign body of scalp, initial encounter: Secondary | ICD-10-CM | POA: Diagnosis present

## 2019-09-04 DIAGNOSIS — F0390 Unspecified dementia without behavioral disturbance: Secondary | ICD-10-CM | POA: Diagnosis present

## 2019-09-04 DIAGNOSIS — S62101A Fracture of unspecified carpal bone, right wrist, initial encounter for closed fracture: Secondary | ICD-10-CM | POA: Diagnosis not present

## 2019-09-04 DIAGNOSIS — Z881 Allergy status to other antibiotic agents status: Secondary | ICD-10-CM | POA: Diagnosis not present

## 2019-09-04 DIAGNOSIS — S3210XD Unspecified fracture of sacrum, subsequent encounter for fracture with routine healing: Secondary | ICD-10-CM | POA: Diagnosis not present

## 2019-09-04 DIAGNOSIS — Z8249 Family history of ischemic heart disease and other diseases of the circulatory system: Secondary | ICD-10-CM | POA: Diagnosis not present

## 2019-09-04 DIAGNOSIS — S52571D Other intraarticular fracture of lower end of right radius, subsequent encounter for closed fracture with routine healing: Secondary | ICD-10-CM | POA: Diagnosis not present

## 2019-09-04 DIAGNOSIS — R279 Unspecified lack of coordination: Secondary | ICD-10-CM | POA: Diagnosis not present

## 2019-09-04 DIAGNOSIS — N179 Acute kidney failure, unspecified: Secondary | ICD-10-CM | POA: Diagnosis present

## 2019-09-04 DIAGNOSIS — W1830XA Fall on same level, unspecified, initial encounter: Secondary | ICD-10-CM | POA: Diagnosis present

## 2019-09-04 DIAGNOSIS — Z95828 Presence of other vascular implants and grafts: Secondary | ICD-10-CM | POA: Diagnosis not present

## 2019-09-04 DIAGNOSIS — Z87442 Personal history of urinary calculi: Secondary | ICD-10-CM | POA: Diagnosis not present

## 2019-09-04 DIAGNOSIS — S329XXA Fracture of unspecified parts of lumbosacral spine and pelvis, initial encounter for closed fracture: Secondary | ICD-10-CM | POA: Diagnosis not present

## 2019-09-04 DIAGNOSIS — I739 Peripheral vascular disease, unspecified: Secondary | ICD-10-CM | POA: Diagnosis present

## 2019-09-04 DIAGNOSIS — K219 Gastro-esophageal reflux disease without esophagitis: Secondary | ICD-10-CM | POA: Diagnosis present

## 2019-09-04 DIAGNOSIS — Z743 Need for continuous supervision: Secondary | ICD-10-CM | POA: Diagnosis not present

## 2019-09-04 DIAGNOSIS — Z66 Do not resuscitate: Secondary | ICD-10-CM | POA: Diagnosis present

## 2019-09-04 LAB — CBC
HCT: 32.6 % — ABNORMAL LOW (ref 36.0–46.0)
Hemoglobin: 10 g/dL — ABNORMAL LOW (ref 12.0–15.0)
MCH: 30.9 pg (ref 26.0–34.0)
MCHC: 30.7 g/dL (ref 30.0–36.0)
MCV: 100.6 fL — ABNORMAL HIGH (ref 80.0–100.0)
Platelets: 106 10*3/uL — ABNORMAL LOW (ref 150–400)
RBC: 3.24 MIL/uL — ABNORMAL LOW (ref 3.87–5.11)
RDW: 14.2 % (ref 11.5–15.5)
WBC: 8.4 10*3/uL (ref 4.0–10.5)
nRBC: 0 % (ref 0.0–0.2)

## 2019-09-04 LAB — BASIC METABOLIC PANEL
Anion gap: 9 (ref 5–15)
BUN: 49 mg/dL — ABNORMAL HIGH (ref 8–23)
CO2: 30 mmol/L (ref 22–32)
Calcium: 9.1 mg/dL (ref 8.9–10.3)
Chloride: 101 mmol/L (ref 98–111)
Creatinine, Ser: 1.43 mg/dL — ABNORMAL HIGH (ref 0.44–1.00)
GFR calc Af Amer: 36 mL/min — ABNORMAL LOW (ref >60)
GFR calc non Af Amer: 31 mL/min — ABNORMAL LOW (ref >60)
Glucose, Bld: 89 mg/dL (ref 70–99)
Potassium: 4 mmol/L (ref 3.5–5.1)
Sodium: 140 mmol/L (ref 135–145)

## 2019-09-04 LAB — SARS CORONAVIRUS 2 (TAT 6-24 HRS): SARS Coronavirus 2: NEGATIVE

## 2019-09-04 MED ORDER — SULFAMETHOXAZOLE-TRIMETHOPRIM 400-80 MG PO TABS
1.0000 | ORAL_TABLET | Freq: Two times a day (BID) | ORAL | Status: DC
  Administered 2019-09-04 – 2019-09-05 (×2): 1 via ORAL
  Filled 2019-09-04 (×4): qty 1

## 2019-09-04 NOTE — Consult Note (Signed)
ORTHOPAEDIC CONSULTATION  REQUESTING PHYSICIAN: Mansy, Arvella Merles, MD  Chief Complaint:   R wrist pain and pelvic pain  History of Present Illness: Kayla Galloway is a 86 y.o. female who had a fall yesterday after losing her balance.  The patient noted pain and difficulty with ambulation.  She has a history of prior right pubic rami fractures.  She also has a prior right distal radius fracture that is being treated nonoperatively by Dr. Marry Guan.  She is last evaluated on 07/22/2019 with plans to return in approximately 4 weeks for repeat imaging and likely transition out of cast to a removable splint.  She did not keep that appointment due to concern for Covid exposure.  Imaging in the emergency department shows a healing distal radius fracture with mild callus formation. Additionally, there appear to be chronic, ununited pubic rami fractures on the right side. Finally, CT scan demonstrates essentially nondisplaced right sacral ala fractures. She denies any bowel or bladder changes. She has no numbness or tingling in her legs.    Past Medical History:  Diagnosis Date  . Anemia   . Arthritis   . Cancer ([place])    skin ca lesion of scalp- removed  . Depression   . DVT (deep venous thrombosis), left    bilateral, IVC filter 2010  . GERD (gastroesophageal reflux disease)   . Gout   . Hypercholesterolemia   . Hyperkalemia   . Hypertension    Dr. [name]  . Nephrolithiasis   . Obesity   . Osteoporosis   . Peripheral vascular disease ([place])   . 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 Spider veins    Past Surgical History:  Procedure Laterality Date  . CARDIAC CATHETERIZATION     2010 Does not see a cardiac  doctor  . Colonsopy    . DENTAL SURGERY    . ECTOPIC PREGNANCY SURGERY    . EYE SURGERY Bilateral 2011,2012    cataracts  . FRACTURE SURGERY  2009   hip, rod from knee to hip  . HEMORRHOID SURGERY    . IVC filter Left    pt states it has turned side ways and could not be removed.  Marland Kitchen KYPHOPLASTY  09/03/2012   per patient, she has had kypho x 2:  Surgeon: Winfield Cunas, MD;  Location: [facility];  Service: Neurosurgery;  Laterality: N/A;  Thoracic eight Kyphoplasty  . OPEN REDUCTION INTERNAL FIXATION (ORIF) DISTAL RADIAL FRACTURE Left 09/06/2015   Procedure: OPEN REDUCTION INTERNAL FIXATION (ORIF) DISTAL RADIAL FRACTURE;  Surgeon: Hessie Knows, MD;  Location: [facility];  Service: Orthopedics;  Laterality: Left;  . RIGHT OOPHORECTOMY     partial-  . SKIN CANCER EXCISION     top of head  . TONSILLECTOMY     age 51  . VERTEBROPLASTY  12/31/2011   Procedure: VERTEBROPLASTY;  Surgeon: Winfield Cunas, MD;  Location: [facility];  Service: Neurosurgery;  Laterality: N/A;  Thoracic eleven vertebroplasty   Social History   Socioeconomic History  . Marital status: Widowed    Spouse name: Not on file  . Number of children: 0  . Years of education: Not on file  . Highest education level: Not on file  Occupational History  . Not on file  Tobacco Use  . Smoking status: Never Smoker  . Smokeless tobacco: Never Used  Substance and  Sexual Activity  . Alcohol use: No    Alcohol/week: 0.0 standard drinks  . Drug use: No  . Sexual activity: Never  Other Topics Concern  . Not on file  Social History Narrative  . Not on file   Social Determinants of Health   Financial Resource Strain:   . Difficulty of Paying Living Expenses: Not on file  Food Insecurity:   . Worried About Charity fundraiser in the Last Year: Not on file  . Ran Out of Food in the Last Year: Not on file  Transportation Needs:   . Lack of Transportation (Medical): Not on file  . Lack of Transportation (Non-Medical): Not on file  Physical Activity:   . Days of Exercise per Week: Not on file  . Minutes of Exercise per Session: Not  on file  Stress:   . Feeling of Stress : Not on file  Social Connections:   . Frequency of Communication with Friends and Family: Not on file  . Frequency of Social Gatherings with Friends and Family: Not on file  . Attends Religious Services: Not on file  . Active Member of Clubs or Organizations: Not on file  . Attends Archivist Meetings: Not on file  . Marital Status: Not on file   Family History  Problem Relation Age of Onset  . Heart disease Father        myocardial infarction  . Heart disease Brother        myocardial infarction  . Lymphoma Sister   . Anesthesia problems Neg Hx   . Breast cancer Neg Hx   . Colon cancer Neg Hx    Allergies  Allergen Reactions  . Cefuroxime Axetil Rash    Pt states that she does fine with Keflex.    . Doxycycline Nausea Only and Other (See Comments)    Reaction:  Weight loss   . Advil [Ibuprofen] Swelling  . Celebrex [Celecoxib] Nausea Only  . Daypro [Oxaprozin] Other (See Comments)    Reaction:  Dizziness   . Etodolac Nausea Only  . Macrobid [employer] Macro] Other (See Comments)    Reaction:  Burning   . Penicillins Other (See Comments)    Reaction:  Burning   . Percocet [Oxycodone-Acetaminophen] Nausea Only and Swelling  . Tramadol Other (See Comments)    Reaction:  Unknown   . Valium [Diazepam] Other (See Comments)    Reaction:  Dizziness    . Neosporin [Neomycin-Bacitracin Zn-Polymyx] Rash  . Vicodin [Hydrocodone-Acetaminophen] Nausea Only   Prior to Admission medications   Medication Sig Start Date End Date Taking? Authorizing Provider  Calcium 600-200 MG-UNIT tablet Take 1 tablet by mouth 2 (two) times daily.    Yes [provider]  ferrous sulfate 325 (65 FE) MG tablet Take 325 mg by mouth 2 (two) times daily.    Yes [provider]  Glucosamine-Chondroitin 250-200 MG TABS Take 1 tablet by mouth 2 (two) times daily.    Yes [provider]  isosorbide dinitrate (ISORDIL)  10 MG tablet Take 10 mg by mouth 2 (two) times daily.   Yes [provider]  Melatonin 5 MG TABS Take 5 mg by mouth at bedtime.    Yes [provider]  Multiple Vitamins-Minerals (CENTRUM SILVER ADULT 50+ PO) Take 1 tablet by mouth daily.    Yes [provider]  ramipril (ALTACE) 2.5 MG capsule TAKE 1 CAPSULE BY MOUTH ONCE DAILY Patient taking differently: Take 5 mg by mouth daily.  09/10/18  Yes [name], MD  sertraline (ZOLOFT) 50 MG tablet TAKE 1 TABLET BY MOUTH ONCE DAILY Patient taking differently: Take 50 mg by mouth daily.  10/18/18  Yes [name], MD  acetaminophen (TYLENOL) 650 MG CR tablet Take 650 mg by mouth every 4 (four) hours as needed for pain. Do not exceed 3 grams of APAP/day from all sources    [provider]  nitroGLYCERIN (NITROSTAT) 0.4 MG SL tablet Place 0.4 mg under the tongue every 5 (five) minutes as needed for chest pain.    [provider]  ondansetron (ZOFRAN) 4 MG tablet Take 4 mg by mouth every 6 (six) hours as needed for nausea or vomiting.    [provider]  senna-docusate (SENOKOT-S) 8.6-50 MG tablet Take 1 tablet by mouth daily.    [provider]  traMADol (ULTRAM) 50 MG tablet Take 1 tablet (50 mg total) by mouth every 6 (six) hours as needed for moderate pain. 06/22/19   Fritzi Mandes, MD   Recent Labs    09/03/19 1606 09/04/19 0448  WBC 8.7 8.4  HGB 10.0* 10.0*  HCT 32.7* 32.6*  PLT 107* 106*  K 4.3 4.0  CL 98 101  CO2 28 30  BUN 54* 49*  CREATININE 1.54* 1.43*  GLUCOSE 98 89  CALCIUM 9.6 9.1   DG Chest 1 View  Result Date: 09/03/2019 CLINICAL DATA:  Fall EXAM: CHEST  1 VIEW COMPARISON:  03/22/2018 FINDINGS: Examination is somewhat limited on supine rotated view. Heart size appears stable. Aorta is calcified and tortuous. There is a streaky left basilar opacity. No pneumothorax is evident. Multilevel vertebroplasty at T8, T11, and L2. There are additional compression fractures  again noted involving T7, T10, and L1. Upper thoracic levels are not well evaluated. No definite new or worsened compression fracture on single frontal view. IVC filter is noted. IMPRESSION: 1. Streaky left basilar opacity which may represent atelectasis or infiltrate. 2. Multiple vertebral body compression deformities without definite new acute osseous abnormality identified on provided view. Electronically Signed   By: Davina Poke D.O.   On: 09/03/2019 17:22   DG Pelvis 1-2 Views  Result Date: 09/03/2019 CLINICAL DATA:  86 year old female with fall and right hip pain. EXAM: RIGHT FEMUR 2 VIEWS; PELVIS - 1-2 VIEW COMPARISON:  Pelvic radiograph dated 08/11/2008. FINDINGS: Old appearing bilateral pubic bone fractures. The right pubic bone fracture is displaced with nonunion. No acute femoral fracture. There is no dislocation. The bones are osteopenic. Atherosclerotic calcification of the vasculature. Prior internal fixation of left femoral neck fracture. And IVC filter noted. Lower lumbar vertebroplasty. The soft tissues are unremarkable. IMPRESSION: No definite acute fracture or dislocation. Electronically Signed   By: Anner Crete M.D.   On: 09/03/2019 17:19   DG Wrist Complete Right  Result Date: 09/03/2019 CLINICAL DATA:  Radial fracture follow-up EXAM: RIGHT WRIST - COMPLETE 3+ VIEW COMPARISON:  Radiograph 06/20/2019 FINDINGS: Overlying casting material obscures fine bone and soft-tissue detail. Redemonstration of the impacted fracture of the distal radius with intra-articular extension to the radiocarpal joint. Suspect some minimal callus formation is present though difficult to fully assess given the reduced resolution of the osseous structures. Additional advanced degenerative changes are noted in the wrist and imaged hand most pronounced at the first carpometacarpal joint and triscaphe joints. Stable appearance of a radiodensity projecting over the distal ulna. Chondrocalcinosis again seen  in the triangular fibrocartilage. IMPRESSION: Stable alignment of the impacted fracture of the distal radius with intra-articular extension  to the radiocarpal joint. Suspect some minimal callus formation is present though difficult to fully assess given the reduced resolution of the osseous structures. Electronically Signed   By: Lovena Le M.D.   On: 09/03/2019 21:31   CT Head Wo Contrast  Result Date: 09/03/2019 CLINICAL DATA:  Status post unwitnessed fall. EXAM: CT HEAD WITHOUT CONTRAST TECHNIQUE: Contiguous axial images were obtained from the base of the skull through the vertex without intravenous contrast. COMPARISON:  June 20, 2019 FINDINGS: Brain: No evidence of acute infarction, hemorrhage, hydrocephalus, extra-axial collection or mass lesion/mass effect. Moderate brain parenchymal volume loss and deep white matter microangiopathy. Vascular: Calcific atherosclerotic disease. Skull: Normal. Negative for fracture or focal lesion. Sinuses/Orbits: Partial opacification of the left maxillary and left sphenoid sinuses. Other: Right parietal scalp hematoma. IMPRESSION: 1. No acute intracranial abnormality. 2. Atrophy, chronic microvascular disease. Electronically Signed   By: Fidela Salisbury M.D.   On: 09/03/2019 16:55   CT Cervical Spine Wo Contrast  Result Date: 09/03/2019 CLINICAL DATA:  Status post unwitnessed fall. EXAM: CT CERVICAL SPINE WITHOUT CONTRAST TECHNIQUE: Multidetector CT imaging of the cervical spine was performed without intravenous contrast. Multiplanar CT image reconstructions were also generated. COMPARISON:  June 20, 2019 FINDINGS: Alignment: Stable 3.5 anterolisthesis of C3 on C4. Stable 2 mm retrolisthesis of C4 on C5. Stable 1-2 mm retrolisthesis of C5 on C6. Skull base and vertebrae: No acute fracture. No primary bone lesion or focal pathologic process. Soft tissues and spinal canal: No prevertebral fluid or swelling. No visible canal hematoma. Disc levels: Multilevel  osteoarthritic changes of the cervical spine with degenerative disc disease and posterior facet arthropathy. Upper chest: Subpleural scarring/fibrotic changes. Other: None. IMPRESSION: 1. No evidence of acute traumatic injury to the cervical spine. 2. Stable multilevel osteoarthritic changes of the cervical spine with stable anterolisthesis of C3 on C4 and retrolisthesis of C4 on C5 and C5 on C6. Electronically Signed   By: Fidela Salisbury M.D.   On: 09/03/2019 17:07   CT Hip Right Wo Contrast  Result Date: 09/03/2019 CLINICAL DATA:  Right hip pain after fall EXAM: CT OF THE RIGHT HIP WITHOUT CONTRAST TECHNIQUE: Multidetector CT imaging of the right hip was performed according to the standard protocol. Multiplanar CT image reconstructions were also generated. COMPARISON:  Same-day x-ray. CT 08/05/2012 FINDINGS: Bones/Joint/Cartilage Chronic ununited fracture deformities of the right superior and inferior pubic rami with well corticated margins. There is an acute, minimally displaced fracture through the right superior pubic ramus at site of prior fracture (series 2, images 39-45; series 7, image 85). No definite acute fracture component at the inferior pubic ramus site. Partially visualized at the edge of the field of view is an acute nondisplaced fracture involving the right sacrum with fracture line extending into the right sacral foramina at approximately S4 (series 6, images 52-54). The visualized portion of the inferior right SI joint is intact without diastasis. The proximal right femur is intact. Hip joint alignment is maintained without dislocation. Mild degenerative changes of the right hip. Moderate degenerative changes of the pubic symphysis with chondrocalcinosis. No pubic symphysis diastasis. Visualized portion of the right SI joint is intact without diastasis. Ligaments Suboptimally assessed by CT. Muscles and Tendons No musculotendinous abnormality within the limitations of CT. Soft tissues No  soft tissue fluid collection. No large right pelvic sidewall hematoma. Extensive atherosclerotic calcification. IMPRESSION: 1. Acute, minimally displaced fracture through the right superior pubic ramus at site of prior fracture. No definite acute fracture component at the  inferior pubic ramus site. 2. Partially visualized acute nondisplaced fracture involving the right sacral ala with fracture line extending into the right sacral foramina at approximately S4. 3. Chronic ununited fracture deformities of the right superior and inferior pubic rami. 4. Proximal right femur intact without fracture or dislocation. Electronically Signed   By: Davina Poke D.O.   On: 09/03/2019 19:44   DG Femur Min 2 Views Right  Result Date: 09/03/2019 CLINICAL DATA:  86 year old female with fall and right hip pain. EXAM: RIGHT FEMUR 2 VIEWS; PELVIS - 1-2 VIEW COMPARISON:  Pelvic radiograph dated 08/11/2008. FINDINGS: Old appearing bilateral pubic bone fractures. The right pubic bone fracture is displaced with nonunion. No acute femoral fracture. There is no dislocation. The bones are osteopenic. Atherosclerotic calcification of the vasculature. Prior internal fixation of left femoral neck fracture. And IVC filter noted. Lower lumbar vertebroplasty. The soft tissues are unremarkable. IMPRESSION: No definite acute fracture or dislocation. Electronically Signed   By: Anner Crete M.D.   On: 09/03/2019 17:19     Positive ROS: All other systems have been reviewed and were otherwise negative with the exception of those mentioned in the HPI and as above.  Physical Exam: BP (!) 151/70   Pulse 72   Temp 98.3 F (36.8 C) (Oral)   Resp (!) 24   Ht 5' (1.524 m)   Wt 54.4 kg   SpO2 100%   BMI 23.42 kg/m  General:  Alert, no acute distress. Oriented x3 to person, place, and year Psychiatric: Nonagitated Cardiovascular:  No pedal edema, regular rate and rhythm Respiratory:  No wheezing, non-labored breathing GI:  Abdomen  is soft and non-tender Skin:  No lesions in the area of chief complaint, no erythema Neurologic:  Sensation intact distally, CN grossly intact Lymphatic:  No axillary or cervical lymphadenopathy  Orthopedic Exam:  Bilateral LE: 5/5 DF/PF/EHL SILT s/s/t/sp/dp distr Foot wwp Negative log roll/axial load Able to perform passive range of motion to hip and knee without significant increase in pain  RUE: +ain/pin/u motor SILT r/u/m/ax +rad pulse Mild tenderness to palpation about distal radius    X-rays:  As above: Distal radius fracture with maintained alignment and angulation from initial injury films in November 2020 with mild callus formation. There are also chronic, nonunited fractures of the right pubic rami. Lastly, there appear to be minimally displaced fractures of the right sacral ala. She is neurologically intact.  Assessment/Plan: INGRY MOLLISON is a 86 y.o. female with healing right distal radius fracture, chronic right pubic rami fractures, and a nondisplaced sacral ala fracture.   1. I discussed the various treatment options. We agreed to proceed with nonoperative management for all of her fractures.  2. Regarding her right wrist, her short arm cast was removed and she was transition to a removable wrist splint. Please see prior note from [place], [place]. The removable wrist splint was ordered, dispensed, and fitted on the patient today. This is medically necessary to allow for immobilization while allowing for range of motion and use of walker for mobilization.  3. Weight-bear as tolerated on the bilateral lower extremities. The patient can use her right wrist for mobilization with a walker.  4. PT/OT as able. Can remove the wrist splint for gentle range of motion exercises.  5. She can follow back up with [facility] clinic orthopedics approximately 3-4 weeks after discharge.    Leim Fabry   09/04/2019 11:04 AM

## 2019-09-04 NOTE — ED Notes (Signed)
Pt given meal tray and assisted to sit up- pt food opened and set within reach of pt

## 2019-09-04 NOTE — ED Notes (Signed)
Resting quietly with eyes closed, no acute distress noted. °

## 2019-09-04 NOTE — ED Notes (Signed)
Pt repositioned in bed.

## 2019-09-04 NOTE — ED Notes (Signed)
Second message sent to pharmacy for Isordil

## 2019-09-04 NOTE — Progress Notes (Signed)
PROGRESS NOTE    Kayla Galloway  [id number] DOB: 1920-09-24 DOA: 09/03/2019  PCP: [name], MD    LOS - 0   Brief Narrative:  86 yo female with history of DVT s/p IVC filter 2010, chronic anemia, HTN, PVD, presented to the ED on 1/16 after a mechanical fall at home.  In the ED, vitals stable.  UA consistent with UTI.  Chest xray showed streaky left basilar opacity that may represent atelectasis or infiltrate (patient without respiratory symptoms), and multiple vertebral body compression deformities, no new acute osseous abnormality identified.  Right femur and pelvic x-ray showed no fractures.  Right wrist x-ray showed stable alignment of impacted fracture of the distal radius with intra-articular extension to the radiocarpal joint with some minimal callus formation though difficult to assess.  Right hip CT scan revealed acute minimally displaced fracture through the right superior pubic ramus at site of prior fracture with partially visualized acute nondisplaced fracture involving the right sacral ala with fracture line extending into the right sacral foramina at approximately S4 and chronic ununited fracture deformities of the right superior and inferior pubic rami and intact proximal right femur.  Patient was treated in the ED with Tylenol, fosfomycin for UTI, and admitted to hospitalist service with orthopedics consulted.  Subjective 1/17: Patient seen in the ED on hold for a bed.  Denies any current pain.  Reports history of UTI's, drinks cranberry juice to prevent.  Does report recent dysuria.  No fevers or chills.  No acute events reported.  Assessment & Plan:   Principal Problem:   Acute lower UTI Active Problems:   Pelvic fracture (HCC)   Closed sacral fracture (HCC)   Wrist fracture, closed, right, sequela   Hypertension   Hypercholesterolemia   GERD (gastroesophageal reflux disease)  Acute lower UTI - got fosfomycin in ED (several antibiotic allergies) - continue  Bactrim x 3 days - follow up urin culture  Acute Kidney Injury - likely pre-renal secondary to dehydration.  IV hydration. --BMP in AM  Mechanical Fall - resulting in fractures as below --PT evaluation  Pelvic fracture - superior pubic rami fracture Closed right sacral fracture - both secondary to mechanical fall. --Ortho consulted, non-operative fractures --weight-bearing as tolerated  --follow up in [facility] ortho clinic in 3-4 weeks  Wrist fracture, closed, right, sequela - this was a prior fracture.  Patient was due for cast removal but had not gone in due to covid.  Ortho removed cast today and placed in rigid splint so that patient can use walker.   Hypertension --hold ACEI due to AKI for now --monitor BP, will add IV PRN if needed   DVT prophylaxis: heparin   Code Status: DNR  Family Communication: none at bedside  Disposition Plan:  Pending PT evaluation, home vs SNF rehab   Severity of Illness: INPATIENT status: Patient is unable to ambulate due to pain at this time.  Requires further hospital care until SNF placement can be obtained.    * I certify that at the point of admission it is my clinical judgment that the patient will require inpatient hospital care spanning beyond 2 midnights from the point of admission due to high intensity of service, high risk for further deterioration and high frequency of surveillance required.*   Consultants:   Orthopedics  Procedures: Include things that cannot be auto populated i.e. Echo, Carotid and venous dopplers, Foley, Bipap, HD, tubes/drains, wound vac, central lines etc)  Right wrist cast removal - 1/17  Antimicrobials:  Bactrim 1/17-1/19    Objective: Vitals:   09/04/19 1530 09/04/19 1533 09/04/19 1540 09/04/19 1701  BP: 113/62   (!) 134/57  Pulse:   70 71  Resp:  20  18  Temp:    98.1 F (36.7 C)  TempSrc:    Oral  SpO2:   100% 96%  Weight:      Height:        Intake/Output Summary (Last 24 hours) at  09/04/2019 1923 Last data filed at 09/04/2019 0945 Gross per 24 hour  Intake --  Output 750 ml  Net -750 ml   Filed Weights   09/03/19 1551  Weight: 54.4 kg    Examination:  General exam: awake, alert, no acute distress, frail Respiratory system: clear to auscultation bilaterally, no wheezes, rales or rhonchi, normal respiratory effort. Cardiovascular system: normal S1/S2, RRR, no JVD, murmurs, rubs, gallops, no pedal edema.   Gastrointestinal system: soft, non-tender, non-distended abdomen Central nervous system: alert and oriented x3. no gross focal neurologic deficits, normal speech Extremities: moves all, no edema, normal tone Skin: dry, intact, normal temperature Psychiatry: normal mood, congruent affect, judgement and insight appear normal    Data Reviewed: I have personally reviewed following labs and imaging studies  CBC: Recent Labs  Lab 09/03/19 1606 09/04/19 0448  WBC 8.7 8.4  NEUTROABS 6.0  --   HGB 10.0* 10.0*  HCT 32.7* 32.6*  MCV 101.9* 100.6*  PLT 107* [id number]*   Basic Metabolic Panel: Recent Labs  Lab 09/03/19 1606 09/04/19 0448  NA 139 140  K 4.3 4.0  CL 98 101  CO2 28 30  GLUCOSE 98 89  BUN 54* 49*  CREATININE 1.54* 1.43*  CALCIUM 9.6 9.1   GFR: Estimated Creatinine Clearance: 16.5 mL/min (A) (by C-G formula based on SCr of 1.43 mg/dL (H)). Liver Function Tests: No results for input(s): AST, ALT, ALKPHOS, BILITOT, PROT, ALBUMIN in the last 168 hours. No results for input(s): LIPASE, AMYLASE in the last 168 hours. No results for input(s): AMMONIA in the last 168 hours. Coagulation Profile: No results for input(s): INR, PROTIME in the last 168 hours. Cardiac Enzymes: No results for input(s): CKTOTAL, CKMB, CKMBINDEX, TROPONINI in the last 168 hours. BNP (last 3 results) No results for input(s): PROBNP in the last 8760 hours. HbA1C: No results for input(s): HGBA1C in the last 72 hours. CBG: No results for input(s): GLUCAP in the last 168  hours. Lipid Profile: No results for input(s): CHOL, HDL, LDLCALC, TRIG, CHOLHDL, LDLDIRECT in the last 72 hours. Thyroid Function Tests: No results for input(s): TSH, T4TOTAL, FREET4, T3FREE, THYROIDAB in the last 72 hours. Anemia Panel: No results for input(s): VITAMINB12, FOLATE, FERRITIN, TIBC, IRON, RETICCTPCT in the last 72 hours. Sepsis Labs: No results for input(s): PROCALCITON, LATICACIDVEN in the last 168 hours.  Recent Results (from the past 240 hour(s))  SARS CORONAVIRUS 2 (TAT 6-24 HRS) Nasopharyngeal     Status: None   Collection Time: 09/03/19  8:27 PM   Specimen: Nasopharyngeal  Result Value Ref Range Status   SARS Coronavirus 2 NEGATIVE NEGATIVE Final    Comment: (NOTE) SARS-CoV-2 target nucleic acids are NOT DETECTED. The SARS-CoV-2 RNA is generally detectable in upper and lower respiratory specimens during the acute phase of infection. Negative results do not preclude SARS-CoV-2 infection, do not rule out co-infections with other pathogens, and should not be used as the sole basis for treatment or other patient management decisions. Negative results must be combined with clinical observations, patient history, and  epidemiological information. The expected result is Negative. Fact Sheet for Patients: SugarRoll.be Fact Sheet for Healthcare Providers: https://www.woods-mathews.com/ This test is not yet approved or cleared by the [place] FDA and  has been authorized for detection and/or diagnosis of SARS-CoV-2 by FDA under an Emergency Use Authorization (EUA). This EUA will remain  in effect (meaning this test can be used) for the duration of the COVID-19 declaration under Section 56 4(b)(1) of the Act, 21 U.S.C. section 360bbb-3(b)(1), unless the authorization is terminated or revoked sooner. Performed at [facility], [place] [address], [place], [place] [address]          Radiology Studies: DG Chest 1  View  Result Date: 09/03/2019 CLINICAL DATA:  Fall EXAM: CHEST  1 VIEW COMPARISON:  03/22/2018 FINDINGS: Examination is somewhat limited on supine rotated view. Heart size appears stable. Aorta is calcified and tortuous. There is a streaky left basilar opacity. No pneumothorax is evident. Multilevel vertebroplasty at T8, T11, and L2. There are additional compression fractures again noted involving T7, T10, and L1. Upper thoracic levels are not well evaluated. No definite new or worsened compression fracture on single frontal view. IVC filter is noted. IMPRESSION: 1. Streaky left basilar opacity which may represent atelectasis or infiltrate. 2. Multiple vertebral body compression deformities without definite new acute osseous abnormality identified on provided view. Electronically Signed   By: Davina Poke D.O.   On: 09/03/2019 17:22   DG Pelvis 1-2 Views  Result Date: 09/03/2019 CLINICAL DATA:  86 year old female with fall and right hip pain. EXAM: RIGHT FEMUR 2 VIEWS; PELVIS - 1-2 VIEW COMPARISON:  Pelvic radiograph dated 08/11/2008. FINDINGS: Old appearing bilateral pubic bone fractures. The right pubic bone fracture is displaced with nonunion. No acute femoral fracture. There is no dislocation. The bones are osteopenic. Atherosclerotic calcification of the vasculature. Prior internal fixation of left femoral neck fracture. And IVC filter noted. Lower lumbar vertebroplasty. The soft tissues are unremarkable. IMPRESSION: No definite acute fracture or dislocation. Electronically Signed   By: Anner Crete M.D.   On: 09/03/2019 17:19   DG Wrist Complete Right  Result Date: 09/03/2019 CLINICAL DATA:  Radial fracture follow-up EXAM: RIGHT WRIST - COMPLETE 3+ VIEW COMPARISON:  Radiograph 06/20/2019 FINDINGS: Overlying casting material obscures fine bone and soft-tissue detail. Redemonstration of the impacted fracture of the distal radius with intra-articular extension to the radiocarpal joint. Suspect  some minimal callus formation is present though difficult to fully assess given the reduced resolution of the osseous structures. Additional advanced degenerative changes are noted in the wrist and imaged hand most pronounced at the first carpometacarpal joint and triscaphe joints. Stable appearance of a radiodensity projecting over the distal ulna. Chondrocalcinosis again seen in the triangular fibrocartilage. IMPRESSION: Stable alignment of the impacted fracture of the distal radius with intra-articular extension to the radiocarpal joint. Suspect some minimal callus formation is present though difficult to fully assess given the reduced resolution of the osseous structures. Electronically Signed   By: Lovena Le M.D.   On: 09/03/2019 21:31   CT Head Wo Contrast  Result Date: 09/03/2019 CLINICAL DATA:  Status post unwitnessed fall. EXAM: CT HEAD WITHOUT CONTRAST TECHNIQUE: Contiguous axial images were obtained from the base of the skull through the vertex without intravenous contrast. COMPARISON:  June 20, 2019 FINDINGS: Brain: No evidence of acute infarction, hemorrhage, hydrocephalus, extra-axial collection or mass lesion/mass effect. Moderate brain parenchymal volume loss and deep white matter microangiopathy. Vascular: Calcific atherosclerotic disease. Skull: Normal. Negative for fracture  or focal lesion. Sinuses/Orbits: Partial opacification of the left maxillary and left sphenoid sinuses. Other: Right parietal scalp hematoma. IMPRESSION: 1. No acute intracranial abnormality. 2. Atrophy, chronic microvascular disease. Electronically Signed   By: Fidela Salisbury M.D.   On: 09/03/2019 16:55   CT Cervical Spine Wo Contrast  Result Date: 09/03/2019 CLINICAL DATA:  Status post unwitnessed fall. EXAM: CT CERVICAL SPINE WITHOUT CONTRAST TECHNIQUE: Multidetector CT imaging of the cervical spine was performed without intravenous contrast. Multiplanar CT image reconstructions were also generated.  COMPARISON:  June 20, 2019 FINDINGS: Alignment: Stable 3.5 anterolisthesis of C3 on C4. Stable 2 mm retrolisthesis of C4 on C5. Stable 1-2 mm retrolisthesis of C5 on C6. Skull base and vertebrae: No acute fracture. No primary bone lesion or focal pathologic process. Soft tissues and spinal canal: No prevertebral fluid or swelling. No visible canal hematoma. Disc levels: Multilevel osteoarthritic changes of the cervical spine with degenerative disc disease and posterior facet arthropathy. Upper chest: Subpleural scarring/fibrotic changes. Other: None. IMPRESSION: 1. No evidence of acute traumatic injury to the cervical spine. 2. Stable multilevel osteoarthritic changes of the cervical spine with stable anterolisthesis of C3 on C4 and retrolisthesis of C4 on C5 and C5 on C6. Electronically Signed   By: Fidela Salisbury M.D.   On: 09/03/2019 17:07   CT Hip Right Wo Contrast  Result Date: 09/03/2019 CLINICAL DATA:  Right hip pain after fall EXAM: CT OF THE RIGHT HIP WITHOUT CONTRAST TECHNIQUE: Multidetector CT imaging of the right hip was performed according to the standard protocol. Multiplanar CT image reconstructions were also generated. COMPARISON:  Same-day x-ray. CT 08/05/2012 FINDINGS: Bones/Joint/Cartilage Chronic ununited fracture deformities of the right superior and inferior pubic rami with well corticated margins. There is an acute, minimally displaced fracture through the right superior pubic ramus at site of prior fracture (series 2, images 39-45; series 7, image 85). No definite acute fracture component at the inferior pubic ramus site. Partially visualized at the edge of the field of view is an acute nondisplaced fracture involving the right sacrum with fracture line extending into the right sacral foramina at approximately S4 (series 6, images 52-54). The visualized portion of the inferior right SI joint is intact without diastasis. The proximal right femur is intact. Hip joint alignment is  maintained without dislocation. Mild degenerative changes of the right hip. Moderate degenerative changes of the pubic symphysis with chondrocalcinosis. No pubic symphysis diastasis. Visualized portion of the right SI joint is intact without diastasis. Ligaments Suboptimally assessed by CT. Muscles and Tendons No musculotendinous abnormality within the limitations of CT. Soft tissues No soft tissue fluid collection. No large right pelvic sidewall hematoma. Extensive atherosclerotic calcification. IMPRESSION: 1. Acute, minimally displaced fracture through the right superior pubic ramus at site of prior fracture. No definite acute fracture component at the inferior pubic ramus site. 2. Partially visualized acute nondisplaced fracture involving the right sacral ala with fracture line extending into the right sacral foramina at approximately S4. 3. Chronic ununited fracture deformities of the right superior and inferior pubic rami. 4. Proximal right femur intact without fracture or dislocation. Electronically Signed   By: Davina Poke D.O.   On: 09/03/2019 19:44   DG Femur Min 2 Views Right  Result Date: 09/03/2019 CLINICAL DATA:  86 year old female with fall and right hip pain. EXAM: RIGHT FEMUR 2 VIEWS; PELVIS - 1-2 VIEW COMPARISON:  Pelvic radiograph dated 08/11/2008. FINDINGS: Old appearing bilateral pubic bone fractures. The right pubic bone fracture is displaced with nonunion.  No acute femoral fracture. There is no dislocation. The bones are osteopenic. Atherosclerotic calcification of the vasculature. Prior internal fixation of left femoral neck fracture. And IVC filter noted. Lower lumbar vertebroplasty. The soft tissues are unremarkable. IMPRESSION: No definite acute fracture or dislocation. Electronically Signed   By: Anner Crete M.D.   On: 09/03/2019 17:19        Scheduled Meds: . calcium-vitamin D  1 tablet Oral BID  . ferrous sulfate  325 mg Oral BID AC  . heparin injection  (subcutaneous)  5,000 Units Subcutaneous Q8H  . isosorbide dinitrate  10 mg Oral BID  . Melatonin  5 mg Oral QHS  . multivitamin with minerals   Oral Daily  . senna-docusate  1 tablet Oral Daily  . sertraline  50 mg Oral Daily  . sulfamethoxazole-trimethoprim  1 tablet Oral Q12H   Continuous Infusions: . sodium chloride 100 mL/hr at 09/04/19 0024     LOS: 0 days    Time spent: 30 minutes    Ezekiel Slocumb, DO [facility]   If 7PM-7AM, please contact night-coverage [email or web address] Password [facility] 09/04/2019, 7:23 PM

## 2019-09-04 NOTE — ED Notes (Signed)
Sent pharmacy a message for Isordil and oscal

## 2019-09-04 NOTE — Progress Notes (Signed)
Physical Therapy Evaluation Patient Details Name: Kayla Galloway MRN: [id number] DOB: 1921/04/29 Today's Date: 09/04/2019   History of Present Illness  Kayla Galloway  is a 86 y.o. female with a known history of multiple medical problems are mentioned below, who presented to the emergency room with acute onset of accidental mechanical fall. Right hip CT scan revealed acute minimally displaced fracture through the right superior pubic ramus at site of prior fracture with partially visualized acute nondisplaced fracture involving the right sacral ala with fracture line extending into the right sacral foramina at approximately S4 and chronic ununited fracture deformities of the right superior and inferior pubic rami and intact proximal right femur.  Clinical Impression  Patient presents with decreased BLE strength, decreased bed mobility and transrfers, decreased sitting balance.  Patient's main complaint is pain and inability to ambulate.She needs mod assist for bed mobility and transfers sit to stand with RW. She is not able to take any steps today. Patient will benefit from skilled therapy in order to increase LE strength and standing and sitting  balance and progress to  Gait training and improve her ability to participate in desired activities and decrease her falls risk.    Follow Up Recommendations SNF    Equipment Recommendations  Rolling walker with 5" wheels    Recommendations for Other Services       Precautions / Restrictions Precautions Precautions: Fall Required Braces or Orthoses: (right wrist brace) Restrictions Weight Bearing Restrictions: No      Mobility  Bed Mobility Overal bed mobility: Needs Assistance Bed Mobility: Supine to Sit;Sit to Supine     Supine to sit: Mod assist Sit to supine: Mod assist   General bed mobility comments: needs VC   Transfers Overall transfer level: Needs assistance Equipment used: Rolling walker (2 wheeled) Transfers: Sit  to/from Stand Sit to Stand: Mod assist         General transfer comment: (vc for safety and sequencing)  Ambulation/Gait Ambulation/Gait assistance: (unable)              Stairs            Wheelchair Mobility    Modified Rankin (Stroke Patients Only)       Balance Overall balance assessment: Needs assistance Sitting-balance support: Single extremity supported;Feet unsupported Sitting balance-Leahy Scale: Good   Postural control: Posterior lean Standing balance support: Single extremity supported Standing balance-Leahy Scale: Fair Standing balance comment: (not able to straighten LE all the way with RW support)                             Pertinent Vitals/Pain Pain Assessment: 0-10 Pain Score: 2  Pain Location: (L hip) Pain Descriptors / Indicators: Aching    Home Living Family/patient expects to be discharged to:: Skilled nursing facility                      Prior Function Level of Independence: Independent with assistive device(s)               Hand Dominance   Dominant Hand: Left    Extremity/Trunk Assessment   Upper Extremity Assessment Upper Extremity Assessment: (right wrist splint)    Lower Extremity Assessment Lower Extremity Assessment: Generalized weakness(-3/5 BLE hip flex and abd)       Communication   Communication: No difficulties  Cognition Arousal/Alertness: Awake/alert Behavior During Therapy: WFL for tasks assessed/performed Overall Cognitive Status: Within Functional Limits  for tasks assessed                                        General Comments      Exercises General Exercises - Lower Extremity Ankle Circles/Pumps: Strengthening;Both;10 reps Heel Slides: Right;Left;Both;10 reps Hip ABduction/ADduction: Strengthening;Right;Left;Both;5 reps Straight Leg Raises: AAROM;Strengthening;Right;Left;5 reps   Assessment/Plan    PT Assessment Patient needs continued PT  services  PT Problem List Decreased strength;Decreased activity tolerance;Decreased balance;Decreased mobility;Decreased safety awareness;Pain       PT Treatment Interventions Gait training;Functional mobility training;Therapeutic activities;Therapeutic exercise;Balance training    PT Goals (Current goals can be found in the Care Plan section)  Acute Rehab PT Goals Patient Stated Goal: to walk PT Goal Formulation: Patient unable to participate in goal setting Time For Goal Achievement: 09/18/19 Potential to Achieve Goals: Fair    Frequency 7X/week   Barriers to discharge        Co-evaluation               AM-PAC PT "6 Clicks" Mobility  Outcome Measure Help needed turning from your back to your side while in a flat bed without using bedrails?: A Lot Help needed moving from lying on your back to sitting on the side of a flat bed without using bedrails?: A Lot Help needed moving to and from a bed to a chair (including a wheelchair)?: Total   Help needed to walk in hospital room?: Total Help needed climbing 3-5 steps with a railing? : Total 6 Click Score: 7    End of Session Equipment Utilized During Treatment: Gait belt Activity Tolerance: Patient tolerated treatment well;No increased pain Patient left: in bed;with call bell/phone within reach Nurse Communication: Mobility status PT Visit Diagnosis: Unsteadiness on feet (R26.81);Other abnormalities of gait and mobility (R26.89);Repeated falls (R29.6);Muscle weakness (generalized) (M62.81);History of falling (Z91.81);Difficulty in walking, not elsewhere classified (R26.2);Pain Pain - Right/Left: Left Pain - part of body: Hip(left and right hips hurt )    Time: [id number] PT Time Calculation (min) (ACUTE ONLY): 30 min   Charges:   PT Evaluation $PT Eval Low Complexity: 1 Low PT Treatments $Therapeutic Exercise: 8-22 mins          Alanson Puls , PT DPT 09/04/2019, 10:33 AM

## 2019-09-04 NOTE — ED Notes (Signed)
Pt breakfast tray placed at bedside

## 2019-09-04 NOTE — ED Notes (Signed)
Heads up provided to inpatient room- unit states RN is busy. Extension provided for RN to call if she has questions/concerns. Transportation requested- per unit pt will go to 150.

## 2019-09-04 NOTE — ED Notes (Signed)
PT at bedside.

## 2019-09-04 NOTE — ED Notes (Signed)
Received call back from Amy, RN- questions answered. RN states pt can go to room

## 2019-09-04 NOTE — ED Notes (Signed)
Pt given water to drink. 

## 2019-09-04 NOTE — ED Notes (Signed)
Patient attempting to take off night gown, when questioned why patient responded "it's wet"  While assisting patient into dry hospital gown, noted that patient had pulled out her IV.

## 2019-09-04 NOTE — ED Notes (Signed)
Resting quietly with eyes closed. No acute distress noted.

## 2019-09-04 NOTE — ED Notes (Signed)
Pt states she is not hungry right now, but would like something to drink- gave pt water

## 2019-09-04 NOTE — Progress Notes (Signed)
PHARMACY NOTE:  ANTIMICROBIAL RENAL DOSAGE ADJUSTMENT  Current antimicrobial regimen includes a mismatch between antimicrobial dosage and estimated renal function.  As per policy approved by the Pharmacy & Therapeutics and Medical Executive Committees, the antimicrobial dosage will be adjusted accordingly.   Current antimicrobial dosage:  Bactrim DS tablet Q12H  x 6 doses  Indication: UTI  Renal Function:  Estimated Creatinine Clearance: 16.5 mL/min (A) (by C-G formula based on SCr of 1.43 mg/dL (H)). []      On intermittent HD, scheduled: []      On CRRT    Antimicrobial dosage has been changed to:  Bactrim SS 1 tablet Q12H x 6 doses  Additional comments:   Thank you for allowing pharmacy to be a part of this patient's care.  Cheri Guppy, [facility] 09/04/2019 1:58 PM

## 2019-09-04 NOTE — Progress Notes (Signed)
Attempted to call report, line busy, called main # and left message.

## 2019-09-04 NOTE — Progress Notes (Signed)
Patient evaluated for right wrist injury.  Patient originally suffered a right distal radius fracture in November of this year.  Was seen by [facility], being treated with a cast.  She was due to follow-up towards the end of December but did not due to [facility] concerns.  Patient has had a recent fall and has suffered a right superior pubic ramus fracture and right sacral ala fracture and is being admitted for pain control.  The patient also had a right wrist x-ray which showed no displacement of fracture with callus formation.  The cast was removed today at bedside, no open wounds or signs of infection.  Patient with decreased mobility due to period of immobilization but is able to make a fist and supinate and pronate with minimal pain.  Tenderness with palpation over the distal radius, cap refill is intact to each finger.  A lace-up wrist splint was applied, she can remove this to shower but is to wear it for daily activities at this time.  When showering can work on some gentle motion.  Can use the wrist when using a walker.  Will follow-up in the office in 3-4 weeks for repeat x-rays of the right wrist.  A formal consult note to come later by Leim Fabry, MD.  Raquel Zacharias Ridling, PA-C [place]

## 2019-09-04 NOTE — ED Notes (Signed)
External cath placed for patient comfort.

## 2019-09-05 ENCOUNTER — Inpatient Hospital Stay: Payer: Medicare Other

## 2019-09-05 DIAGNOSIS — S3210XD Unspecified fracture of sacrum, subsequent encounter for fracture with routine healing: Secondary | ICD-10-CM

## 2019-09-05 LAB — BASIC METABOLIC PANEL
Anion gap: 11 (ref 5–15)
BUN: 44 mg/dL — ABNORMAL HIGH (ref 8–23)
CO2: 26 mmol/L (ref 22–32)
Calcium: 9.2 mg/dL (ref 8.9–10.3)
Chloride: 103 mmol/L (ref 98–111)
Creatinine, Ser: 1.29 mg/dL — ABNORMAL HIGH (ref 0.44–1.00)
GFR calc Af Amer: 40 mL/min — ABNORMAL LOW (ref >60)
GFR calc non Af Amer: 35 mL/min — ABNORMAL LOW (ref >60)
Glucose, Bld: 117 mg/dL — ABNORMAL HIGH (ref 70–99)
Potassium: 4.1 mmol/L (ref 3.5–5.1)
Sodium: 140 mmol/L (ref 135–145)

## 2019-09-05 LAB — CBC WITH DIFFERENTIAL/PLATELET
Abs Immature Granulocytes: 0.05 10*3/uL (ref 0.00–0.07)
Basophils Absolute: 0 10*3/uL (ref 0.0–0.1)
Basophils Relative: 0 %
Eosinophils Absolute: 0 10*3/uL (ref 0.0–0.5)
Eosinophils Relative: 0 %
HCT: 27.6 % — ABNORMAL LOW (ref 36.0–46.0)
Hemoglobin: 8.7 g/dL — ABNORMAL LOW (ref 12.0–15.0)
Immature Granulocytes: 1 %
Lymphocytes Relative: 8 %
Lymphs Abs: 0.9 10*3/uL (ref 0.7–4.0)
MCH: 31 pg (ref 26.0–34.0)
MCHC: 31.5 g/dL (ref 30.0–36.0)
MCV: 98.2 fL (ref 80.0–100.0)
Monocytes Absolute: 0.7 10*3/uL (ref 0.1–1.0)
Monocytes Relative: 6 %
Neutro Abs: 9 10*3/uL — ABNORMAL HIGH (ref 1.7–7.7)
Neutrophils Relative %: 85 %
Platelets: 124 10*3/uL — ABNORMAL LOW (ref 150–400)
RBC: 2.81 MIL/uL — ABNORMAL LOW (ref 3.87–5.11)
RDW: 14.2 % (ref 11.5–15.5)
WBC: 10.6 10*3/uL — ABNORMAL HIGH (ref 4.0–10.5)
nRBC: 0 % (ref 0.0–0.2)

## 2019-09-05 LAB — MAGNESIUM: Magnesium: 2 mg/dL (ref 1.7–2.4)

## 2019-09-05 MED ORDER — SULFAMETHOXAZOLE-TRIMETHOPRIM 400-80 MG PO TABS: 1.0000 | ORAL_TABLET | Freq: Two times a day (BID) | ORAL | 0 refills | Status: DC

## 2019-09-05 MED ORDER — CIPROFLOXACIN HCL 500 MG PO TABS: 500.0000 mg | ORAL_TABLET | Freq: Every day | ORAL | 0 refills | Status: AC

## 2019-09-05 MED ORDER — CIPROFLOXACIN HCL 500 MG PO TABS
500.0000 mg | ORAL_TABLET | Freq: Every day | ORAL | Status: DC
  Filled 2019-09-05: qty 1

## 2019-09-05 NOTE — Progress Notes (Signed)
Patients BP 175/76 , will give PRN labetalol , c/o pain as well will treat and recheck BP Dr. Arbutus Ped notified

## 2019-09-05 NOTE — Plan of Care (Signed)
  Problem: Education: Goal: Knowledge of General Education information will improve Description: Including pain rating scale, medication(s)/side effects and non-pharmacologic comfort measures Outcome: Progressing   Problem: Pain Managment: Goal: General experience of comfort will improve Outcome: Progressing   Problem: Safety: Goal: Ability to remain free from injury will improve Outcome: Progressing   Problem: Education: Goal: Knowledge of General Education information will improve Description: Including pain rating scale, medication(s)/side effects and non-pharmacologic comfort measures Outcome: Progressing   Problem: Safety: Goal: Ability to remain free from injury will improve Outcome: Progressing

## 2019-09-05 NOTE — Discharge Summary (Addendum)
Physician Discharge Summary  Kayla Galloway [id number] DOB: 02/20/1921 DOA: 09/03/2019  PCP: [name], MD  Admit date: 09/03/2019 Discharge date: 09/05/2019  Admitted From: SNF, [facility] Disposition:  SNF, [facility]  Recommendations for Outpatient Follow-up:  1. Follow up with PCP in 1-2 weeks 2. Please obtain BMP/CBC in one week   Home Health: No  Equipment/Devices: None   Discharge Condition: Stable  CODE STATUS: Full  Diet recommendation: Heart Healthy   Brief/Interim Summary:  86 yo female with history of DVT s/p IVC filter 2010, chronic anemia, HTN, PVD, presented to the ED on 1/16 after a mechanical fall at home.  In the ED, vitals stable.  UA consistent with UTI.  Chest xray showed streaky left basilar opacity that may represent atelectasis or infiltrate (patient without respiratory symptoms), and multiple vertebral body compression deformities, no new acute osseous abnormality identified. Right femur and pelvic x-ray showed no fractures. Right wrist x-ray showed stable alignment of impacted fracture of the distal radius with intra-articular extension to the radiocarpal joint with some minimal callus formation though difficult to assess. Right hip CT scan revealed acute minimally displaced fracture through the right superior pubic ramusat site of prior fracture with partially visualized acute nondisplaced fracture involving the right sacral ala with fracture line extending into the right sacral foramina at approximately S4 and chronic ununited fracture deformities of the right superior and inferior pubic rami and intact proximal right femur.  Patient was treated in the ED with Tylenol, fosfomycin for UTI, and admitted to hospitalist service with orthopedics consulted.  Acute lower UTI - got fosfomycin in ED (several antibiotic allergies) - Bactrim x 3 days - follow up urine culture  Acute Kidney Injury - likely pre-renal secondary to dehydration.  IV  hydration.  Improved --BMP in AM  Mechanical Fall - resulting in fractures as below --PT evaluation - recommend SNF  Pelvic fracture - superior pubic rami fracture Closed right sacral fracture - both secondary to mechanical fall. --Ortho consulted, non-operative fractures --weight-bearing as tolerated  --follow up in [facility] in 3-4 weeks  Wrist fracture, closed, right, sequela - this was a prior fracture.  Patient was due for cast removal but had not gone in due to covid.  Ortho removed cast today and placed in rigid splint so that patient can use walker.   Hypertension --hold ACEI due to AKI for now --monitor BP, will add IV PRN if needed   Discharge Diagnoses: Principal Problem:   Acute lower UTI Active Problems:   Pelvic fracture (HCC)   Closed sacral fracture (HCC)   Wrist fracture, closed, right, sequela   Hypertension   Hypercholesterolemia   GERD (gastroesophageal reflux disease)    Discharge Instructions   Discharge Instructions    Call MD for:  severe uncontrolled pain   Complete by: As directed    Call MD for:  temperature >100.4   Complete by: As directed    Diet - low sodium heart healthy   Complete by: As directed    Discharge instructions   Complete by: As directed    Take Bactrim twice daily for two more days for UTI   Increase activity slowly   Complete by: As directed      Allergies as of 09/05/2019      Reactions   Cefuroxime Axetil Rash   Pt states that she does fine with Keflex.     Doxycycline Nausea Only, Other (See Comments)   Reaction:  Weight loss  Advil [ibuprofen] Swelling   Celebrex [celecoxib] Nausea Only   Daypro [oxaprozin] Other (See Comments)   Reaction:  Dizziness    Etodolac Nausea Only   Macrobid [nitrofurantoin Monohyd Macro] Other (See Comments)   Reaction:  Burning    Penicillins Other (See Comments)   Reaction:  Burning    Percocet [oxycodone-acetaminophen] Nausea Only, Swelling   Tramadol Other (See  Comments)   Reaction:  Unknown    Valium [diazepam] Other (See Comments)   Reaction:  Dizziness    Neosporin [neomycin-bacitracin Zn-polymyx] Rash   Vicodin [hydrocodone-acetaminophen] Nausea Only      Medication List    TAKE these medications   acetaminophen 650 MG CR tablet Commonly known as: TYLENOL Take 650 mg by mouth every 4 (four) hours as needed for pain. Do not exceed 3 grams of APAP/day from all sources   Calcium 600-200 MG-UNIT tablet Take 1 tablet by mouth 2 (two) times daily.   CENTRUM SILVER ADULT 50+ PO Take 1 tablet by mouth daily.   ciprofloxacin 500 MG tablet Commonly known as: CIPRO Take 1 tablet (500 mg total) by mouth daily at 2 PM for 3 days.   ferrous sulfate 325 (65 FE) MG tablet Take 325 mg by mouth 2 (two) times daily.   Glucosamine-Chondroitin 250-200 MG Tabs Take 1 tablet by mouth 2 (two) times daily.   isosorbide dinitrate 10 MG tablet Commonly known as: ISORDIL Take 10 mg by mouth 2 (two) times daily.   Melatonin 5 MG Tabs Take 5 mg by mouth at bedtime.   nitroGLYCERIN 0.4 MG SL tablet Commonly known as: NITROSTAT Place 0.4 mg under the tongue every 5 (five) minutes as needed for chest pain.   ondansetron 4 MG tablet Commonly known as: ZOFRAN Take 4 mg by mouth every 6 (six) hours as needed for nausea or vomiting.   ramipril 2.5 MG capsule Commonly known as: ALTACE TAKE 1 CAPSULE BY MOUTH ONCE DAILY What changed: how much to take   senna-docusate 8.6-50 MG tablet Commonly known as: Senokot-S Take 1 tablet by mouth daily.   sertraline 50 MG tablet Commonly known as: ZOLOFT TAKE 1 TABLET BY MOUTH ONCE DAILY   traMADol 50 MG tablet Commonly known as: ULTRAM Take 1 tablet (50 mg total) by mouth every 6 (six) hours as needed for moderate pain.      Contact information for after-discharge care    [facility] .   Service: Skilled Nursing Contact information: [place] [phone]             Allergies  Allergen Reactions  . Cefuroxime Axetil Rash    Pt states that she does fine with Keflex.    . Doxycycline Nausea Only and Other (See Comments)    Reaction:  Weight loss   . Advil [Ibuprofen] Swelling  . Celebrex [Celecoxib] Nausea Only  . Daypro [Oxaprozin] Other (See Comments)    Reaction:  Dizziness   . Etodolac Nausea Only  . Macrobid [employer] Macro] Other (See Comments)    Reaction:  Burning   . Penicillins Other (See Comments)    Reaction:  Burning   . Percocet [Oxycodone-Acetaminophen] Nausea Only and Swelling  . Tramadol Other (See Comments)    Reaction:  Unknown   . Valium [Diazepam] Other (See Comments)    Reaction:  Dizziness    . Neosporin [Neomycin-Bacitracin Zn-Polymyx] Rash  . Vicodin [Hydrocodone-Acetaminophen] Nausea Only    Consultations:  Orthopedics, Dr. Posey Pronto    Procedures/Studies: DG Chest 1 View  Result Date: 09/03/2019 CLINICAL DATA:  Fall EXAM: CHEST  1 VIEW COMPARISON:  03/22/2018 FINDINGS: Examination is somewhat limited on supine rotated view. Heart size appears stable. Aorta is calcified and tortuous. There is a streaky left basilar opacity. No pneumothorax is evident. Multilevel vertebroplasty at T8, T11, and L2. There are additional compression fractures again noted involving T7, T10, and L1. Upper thoracic levels are not well evaluated. No definite new or worsened compression fracture on single frontal view. IVC filter is noted. IMPRESSION: 1. Streaky left basilar opacity which may represent atelectasis or infiltrate. 2. Multiple vertebral body compression deformities without definite new acute osseous abnormality identified on provided view. Electronically Signed   By: Davina Poke D.O.   On: 09/03/2019 17:22   DG Pelvis 1-2 Views  Result Date: 09/03/2019 CLINICAL DATA:  86 year old female with fall and right hip pain. EXAM: RIGHT FEMUR 2 VIEWS;  PELVIS - 1-2 VIEW COMPARISON:  Pelvic radiograph dated 08/11/2008. FINDINGS: Old appearing bilateral pubic bone fractures. The right pubic bone fracture is displaced with nonunion. No acute femoral fracture. There is no dislocation. The bones are osteopenic. Atherosclerotic calcification of the vasculature. Prior internal fixation of left femoral neck fracture. And IVC filter noted. Lower lumbar vertebroplasty. The soft tissues are unremarkable. IMPRESSION: No definite acute fracture or dislocation. Electronically Signed   By: Anner Crete M.D.   On: 09/03/2019 17:19   DG Wrist Complete Right  Result Date: 09/03/2019 CLINICAL DATA:  Radial fracture follow-up EXAM: RIGHT WRIST - COMPLETE 3+ VIEW COMPARISON:  Radiograph 06/20/2019 FINDINGS: Overlying casting material obscures fine bone and soft-tissue detail. Redemonstration of the impacted fracture of the distal radius with intra-articular extension to the radiocarpal joint. Suspect some minimal callus formation is present though difficult to fully assess given the reduced resolution of the osseous structures. Additional advanced degenerative changes are noted in the wrist and imaged hand most pronounced at the first carpometacarpal joint and triscaphe joints. Stable appearance of a radiodensity projecting over the distal ulna. Chondrocalcinosis again seen in the triangular fibrocartilage. IMPRESSION: Stable alignment of the impacted fracture of the distal radius with intra-articular extension to the radiocarpal joint. Suspect some minimal callus formation is present though difficult to fully assess given the reduced resolution of the osseous structures. Electronically Signed   By: Lovena Le M.D.   On: 09/03/2019 21:31   CT Head Wo Contrast  Result Date: 09/03/2019 CLINICAL DATA:  Status post unwitnessed fall. EXAM: CT HEAD WITHOUT CONTRAST TECHNIQUE: Contiguous axial images were obtained from the base of the skull through the vertex without  intravenous contrast. COMPARISON:  June 20, 2019 FINDINGS: Brain: No evidence of acute infarction, hemorrhage, hydrocephalus, extra-axial collection or mass lesion/mass effect. Moderate brain parenchymal volume loss and deep white matter microangiopathy. Vascular: Calcific atherosclerotic disease. Skull: Normal. Negative for fracture or focal lesion. Sinuses/Orbits: Partial opacification of the left maxillary and left sphenoid sinuses. Other: Right parietal scalp hematoma. IMPRESSION: 1. No acute intracranial abnormality. 2. Atrophy, chronic microvascular disease. Electronically Signed   By: Fidela Salisbury M.D.   On: 09/03/2019 16:55   CT Cervical Spine Wo Contrast  Result Date: 09/03/2019 CLINICAL DATA:  Status post unwitnessed fall. EXAM: CT CERVICAL SPINE WITHOUT CONTRAST TECHNIQUE: Multidetector CT imaging of the cervical spine was performed without intravenous contrast. Multiplanar CT image reconstructions were also generated. COMPARISON:  June 20, 2019 FINDINGS: Alignment: Stable 3.5 anterolisthesis of C3 on C4. Stable 2 mm retrolisthesis  of C4 on C5. Stable 1-2 mm retrolisthesis of C5 on C6. Skull base and vertebrae: No acute fracture. No primary bone lesion or focal pathologic process. Soft tissues and spinal canal: No prevertebral fluid or swelling. No visible canal hematoma. Disc levels: Multilevel osteoarthritic changes of the cervical spine with degenerative disc disease and posterior facet arthropathy. Upper chest: Subpleural scarring/fibrotic changes. Other: None. IMPRESSION: 1. No evidence of acute traumatic injury to the cervical spine. 2. Stable multilevel osteoarthritic changes of the cervical spine with stable anterolisthesis of C3 on C4 and retrolisthesis of C4 on C5 and C5 on C6. Electronically Signed   By: Fidela Salisbury M.D.   On: 09/03/2019 17:07   CT Hip Right Wo Contrast  Result Date: 09/03/2019 CLINICAL DATA:  Right hip pain after fall EXAM: CT OF THE RIGHT HIP  WITHOUT CONTRAST TECHNIQUE: Multidetector CT imaging of the right hip was performed according to the standard protocol. Multiplanar CT image reconstructions were also generated. COMPARISON:  Same-day x-ray. CT 08/05/2012 FINDINGS: Bones/Joint/Cartilage Chronic ununited fracture deformities of the right superior and inferior pubic rami with well corticated margins. There is an acute, minimally displaced fracture through the right superior pubic ramus at site of prior fracture (series 2, images 39-45; series 7, image 85). No definite acute fracture component at the inferior pubic ramus site. Partially visualized at the edge of the field of view is an acute nondisplaced fracture involving the right sacrum with fracture line extending into the right sacral foramina at approximately S4 (series 6, images 52-54). The visualized portion of the inferior right SI joint is intact without diastasis. The proximal right femur is intact. Hip joint alignment is maintained without dislocation. Mild degenerative changes of the right hip. Moderate degenerative changes of the pubic symphysis with chondrocalcinosis. No pubic symphysis diastasis. Visualized portion of the right SI joint is intact without diastasis. Ligaments Suboptimally assessed by CT. Muscles and Tendons No musculotendinous abnormality within the limitations of CT. Soft tissues No soft tissue fluid collection. No large right pelvic sidewall hematoma. Extensive atherosclerotic calcification. IMPRESSION: 1. Acute, minimally displaced fracture through the right superior pubic ramus at site of prior fracture. No definite acute fracture component at the inferior pubic ramus site. 2. Partially visualized acute nondisplaced fracture involving the right sacral ala with fracture line extending into the right sacral foramina at approximately S4. 3. Chronic ununited fracture deformities of the right superior and inferior pubic rami. 4. Proximal right femur intact without fracture  or dislocation. Electronically Signed   By: Davina Poke D.O.   On: 09/03/2019 19:44   DG Shoulder Right Port  Result Date: 09/05/2019 CLINICAL DATA:  Fall.  Pain. EXAM: PORTABLE RIGHT SHOULDER COMPARISON:  None. FINDINGS: AC joint degenerative changes. Mild glenohumeral degenerative changes. No fracture or dislocation. IMPRESSION: Negative. Electronically Signed   By: Dorise Bullion III M.D   On: 09/05/2019 11:11   DG Femur Min 2 Views Right  Result Date: 09/03/2019 CLINICAL DATA:  86 year old female with fall and right hip pain. EXAM: RIGHT FEMUR 2 VIEWS; PELVIS - 1-2 VIEW COMPARISON:  Pelvic radiograph dated 08/11/2008. FINDINGS: Old appearing bilateral pubic bone fractures. The right pubic bone fracture is displaced with nonunion. No acute femoral fracture. There is no dislocation. The bones are osteopenic. Atherosclerotic calcification of the vasculature. Prior internal fixation of left femoral neck fracture. And IVC filter noted. Lower lumbar vertebroplasty. The soft tissues are unremarkable. IMPRESSION: No definite acute fracture or dislocation. Electronically Signed   By: Laren Everts.D.  On: 09/03/2019 17:19       Subjective: Patient sitting up in bed talking to someone not present when I entered the room.  Talking about getting someone some coffee.  Patient reports some pain on her back side today, consistent with sacral fracture.  Denies fever/chills, N/V/D, chest pain or trouble breathing.  No acute events reported.   Discharge Exam: Vitals:   09/05/19 0817 09/05/19 1000  BP: (!) 175/76 140/60  Pulse: 79 75  Resp:    Temp: 98.1 F (36.7 C)   SpO2: 96%    Vitals:   09/04/19 2357 09/05/19 0748 09/05/19 0817 09/05/19 1000  BP: 130/67 (!) 158/75 (!) 175/76 140/60  Pulse: 70 83 79 75  Resp: 17 18    Temp: 98 F (36.7 C) 98 F (36.7 C) 98.1 F (36.7 C)   TempSrc: Oral Oral Oral   SpO2: 97% 92% 96%   Weight:      Height:        General: Pt is alert, awake,  not in acute distress, elderly, cachectic Cardiovascular: RRR, S1/S2 +, no rubs, no gallops Respiratory: CTA bilaterally, no wheezing, no rhonchi Abdominal: Soft, NT, ND, bowel sounds + Extremities: no edema, no cyanosis    The results of significant diagnostics from this hospitalization (including imaging, microbiology, ancillary and laboratory) are listed below for reference.     Microbiology: Recent Results (from the past 240 hour(s))  Urine culture     Status: Abnormal (Preliminary result)   Collection Time: 09/03/19  7:50 PM   Specimen: Urine, Random  Result Value Ref Range Status   Specimen Description   Final    URINE, RANDOM Performed at [facility], [address], [place], [place] [address]    Special Requests   Final    NONE Performed at [facility], [address], [place], [place] [address]    Culture (A)  Final    >=100,000 COLONIES/mL PSEUDOMONAS AERUGINOSA SUSCEPTIBILITIES TO FOLLOW Performed at [facility], [place] [address] [place], [place] [address]    Report Status PENDING  Incomplete  SARS CORONAVIRUS 2 (TAT 6-24 HRS) Nasopharyngeal     Status: None   Collection Time: 09/03/19  8:27 PM   Specimen: Nasopharyngeal  Result Value Ref Range Status   SARS Coronavirus 2 NEGATIVE NEGATIVE Final    Comment: (NOTE) SARS-CoV-2 target nucleic acids are NOT DETECTED. The SARS-CoV-2 RNA is generally detectable in upper and lower respiratory specimens during the acute phase of infection. Negative results do not preclude SARS-CoV-2 infection, do not rule out co-infections with other pathogens, and should not be used as the sole basis for treatment or other patient management decisions. Negative results must be combined with clinical observations, patient history, and epidemiological information. The expected result is Negative. Fact Sheet for Patients: SugarRoll.be Fact Sheet for Healthcare  Providers: https://www.woods-mathews.com/ This test is not yet approved or cleared by the [place] FDA and  has been authorized for detection and/or diagnosis of SARS-CoV-2 by FDA under an Emergency Use Authorization (EUA). This EUA will remain  in effect (meaning this test can be used) for the duration of the COVID-19 declaration under Section 56 4(b)(1) of the Act, 21 U.S.C. section 360bbb-3(b)(1), unless the authorization is terminated or revoked sooner. Performed at [facility], [place] [address], [place], [place] [address]      Labs: BNP (last 3 results) No results for input(s): BNP in the last 8760 hours. Basic Metabolic Panel: Recent Labs  Lab 09/03/19 1606 09/04/19 0448 09/05/19 0541  NA 139 140 140  K 4.3 4.0 4.1  CL 98 101 103  CO2 28 30 26   GLUCOSE 98 89 117*  BUN 54* 49* 44*  CREATININE 1.54* 1.43* 1.29*  CALCIUM 9.6 9.1 9.2  MG  --   --  2.0   Liver Function Tests: No results for input(s): AST, ALT, ALKPHOS, BILITOT, PROT, ALBUMIN in the last 168 hours. No results for input(s): LIPASE, AMYLASE in the last 168 hours. No results for input(s): AMMONIA in the last 168 hours. CBC: Recent Labs  Lab 09/03/19 1606 09/04/19 0448 09/05/19 0541  WBC 8.7 8.4 10.6*  NEUTROABS 6.0  --  9.0*  HGB 10.0* 10.0* 8.7*  HCT 32.7* 32.6* 27.6*  MCV 101.9* 100.6* 98.2  PLT 107* 106* 124*   Cardiac Enzymes: No results for input(s): CKTOTAL, CKMB, CKMBINDEX, TROPONINI in the last 168 hours. BNP: Invalid input(s): POCBNP CBG: No results for input(s): GLUCAP in the last 168 hours. D-Dimer No results for input(s): DDIMER in the last 72 hours. Hgb A1c No results for input(s): HGBA1C in the last 72 hours. Lipid Profile No results for input(s): CHOL, HDL, LDLCALC, TRIG, CHOLHDL, LDLDIRECT in the last 72 hours. Thyroid function studies No results for input(s): TSH, T4TOTAL, T3FREE, THYROIDAB in the last 72 hours.  Invalid input(s): FREET3 Anemia work  up No results for input(s): VITAMINB12, FOLATE, FERRITIN, TIBC, IRON, RETICCTPCT in the last 72 hours. Urinalysis    Component Value Date/Time   COLORURINE AMBER (A) 09/03/2019 1950   APPEARANCEUR TURBID (A) 09/03/2019 1950   APPEARANCEUR Clear 08/04/2012 2154   LABSPEC 1.019 09/03/2019 1950   LABSPEC 1.009 08/04/2012 2154   PHURINE 5.0 09/03/2019 1950   GLUCOSEU NEGATIVE 09/03/2019 1950   GLUCOSEU NEGATIVE 06/18/2016 1040   HGBUR NEGATIVE 09/03/2019 1950   BILIRUBINUR NEGATIVE 09/03/2019 1950   BILIRUBINUR negative 09/24/2017 1501   BILIRUBINUR neg 08/15/2014 1102   BILIRUBINUR Negative 08/04/2012 2154   KETONESUR NEGATIVE 09/03/2019 1950   PROTEINUR 100 (A) 09/03/2019 1950   UROBILINOGEN 0.2 09/24/2017 1501   UROBILINOGEN 0.2 06/18/2016 1040   NITRITE POSITIVE (A) 09/03/2019 1950   LEUKOCYTESUR LARGE (A) 09/03/2019 1950   LEUKOCYTESUR Negative 08/04/2012 2154   Sepsis Labs Invalid input(s): PROCALCITONIN,  WBC,  LACTICIDVEN Microbiology Recent Results (from the past 240 hour(s))  Urine culture     Status: Abnormal (Preliminary result)   Collection Time: 09/03/19  7:50 PM   Specimen: Urine, Random  Result Value Ref Range Status   Specimen Description   Final    URINE, RANDOM Performed at [facility], [address] [place], [place] [address]    Special Requests   Final    NONE Performed at [facility], [address] [place], [place] [address]    Culture (A)  Final    >=100,000 COLONIES/mL PSEUDOMONAS AERUGINOSA SUSCEPTIBILITIES TO FOLLOW Performed at [facility], [place] [address], [place], [place] [address]    Report Status PENDING  Incomplete  SARS CORONAVIRUS 2 (TAT 6-24 HRS) Nasopharyngeal     Status: None   Collection Time: 09/03/19  8:27 PM   Specimen: Nasopharyngeal  Result Value Ref Range Status   SARS Coronavirus 2 NEGATIVE NEGATIVE Final    Comment: (NOTE) SARS-CoV-2 target nucleic acids are NOT DETECTED. The SARS-CoV-2  RNA is generally detectable in upper and lower respiratory specimens during the acute phase of infection. Negative results do not preclude SARS-CoV-2 infection, do not rule out  co-infections with other pathogens, and should not be used as the sole basis for treatment or other patient management decisions. Negative results must be combined with clinical observations, patient history, and epidemiological information. The expected result is Negative. Fact Sheet for Patients: SugarRoll.be Fact Sheet for Healthcare Providers: https://www.woods-mathews.com/ This test is not yet approved or cleared by the [place] FDA and  has been authorized for detection and/or diagnosis of SARS-CoV-2 by FDA under an Emergency Use Authorization (EUA). This EUA will remain  in effect (meaning this test can be used) for the duration of the COVID-19 declaration under Section 56 4(b)(1) of the Act, 21 U.S.C. section 360bbb-3(b)(1), unless the authorization is terminated or revoked sooner. Performed at [facility], [place] [address] [place], [place] [address]      Time coordinating discharge: Over 30 minutes  SIGNED:   Ezekiel Slocumb, DO [facility] 09/05/2019, 3:35 PM   If 7PM-7AM, please contact night-coverage [email or web address]

## 2019-09-05 NOTE — TOC Progression Note (Signed)
Transition of Care [facility]) - Progression Note    Patient Details  Name: Kayla Galloway MRN: [id number] Date of Birth: 01-19-1921  [facility] of [facility]) CM/SW Contact  Su Hilt, RN Phone Number: 09/05/2019, 1:26 PM  Clinical Narrative:     Damaris Schooner with Magda Paganini at [facility], The patient is a long term resident there and will be returning       Expected Discharge Plan and Services                                                 Social Determinants of Health (SDOH) Interventions    Readmission Risk Interventions No flowsheet data found.

## 2019-09-05 NOTE — Progress Notes (Signed)
Physical Therapy Treatment Patient Details Name: Kayla Galloway MRN: [id number] DOB: 09-08-1920 Today's Date: 09/05/2019    History of Present Illness Kayla Galloway  is a 86 y.o. female with a known history of multiple medical problems are mentioned below, who presented to the emergency room with acute onset of accidental mechanical fall. Right hip CT scan revealed acute minimally displaced fracture through the right superior pubic ramus at site of prior fracture with partially visualized acute nondisplaced fracture involving the right sacral ala with fracture line extending into the right sacral foramina at approximately S4 and chronic ununited fracture deformities of the right superior and inferior pubic rami and intact proximal right femur.    PT Comments    Pt sliding down in bed with breakfast tray arrived.  Pt reports pain in stomach but denies need to use bathroom.  Pt resistant but encouraged and initially agreed to try OOB.  Mod a x 1 to EOB and she continued to need mod a x 1 to remain sitting.  Encouraged mobility and up to chair for breakfast but she initiated return to supine and was not able to keep pt upright.  She was assisted back to bed with max a x 2 as she laid back down but did not bring feet up.  Care provided due to inc BM.  SNF remains appropriate for discharge plan.   Follow Up Recommendations  SNF     Equipment Recommendations  Rolling walker with 5" wheels;3in1 (PT)    Recommendations for Other Services       Precautions / Restrictions Precautions Precautions: Fall Restrictions Weight Bearing Restrictions: No    Mobility  Bed Mobility Overal bed mobility: Needs Assistance Bed Mobility: Supine to Sit;Sit to Supine     Supine to sit: Mod assist Sit to supine: +2 for physical assistance;Max assist      Transfers                 General transfer comment: initiated returned to supine before transfer could be attemtped.  Ambulation/Gait             General Gait Details: unable   Stairs             Wheelchair Mobility    Modified Rankin (Stroke Patients Only)       Balance Overall balance assessment: Needs assistance Sitting-balance support: Feet supported;Bilateral upper extremity supported Sitting balance-Leahy Scale: Poor Sitting balance - Comments: does not sit on her own today, resists and self initiates return to supine. Postural control: Posterior lean;Right lateral lean     Standing balance comment: unable to attempt                            Cognition Arousal/Alertness: Awake/alert Behavior During Therapy: WFL for tasks assessed/performed Overall Cognitive Status: History of cognitive impairments - at baseline                                 General Comments: "i just feel so bad"      Exercises      General Comments        Pertinent Vitals/Pain Pain Assessment: Faces Faces Pain Scale: Hurts little more Pain Location: stomach - denies needing to use bathroom Pain Descriptors / Indicators: Aching Pain Intervention(s): Limited activity within patient's tolerance;Monitored during session    Home Living  Prior Function            PT Goals (current goals can now be found in the care plan section) Progress towards PT goals: Progressing toward goals    Frequency    7X/week      PT Plan Current plan remains appropriate    Co-evaluation              AM-PAC PT "6 Clicks" Mobility   Outcome Measure  Help needed turning from your back to your side while in a flat bed without using bedrails?: A Lot Help needed moving from lying on your back to sitting on the side of a flat bed without using bedrails?: A Lot Help needed moving to and from a bed to a chair (including a wheelchair)?: Total Help needed standing up from a chair using your arms (e.g., wheelchair or bedside chair)?: Total Help needed to walk in hospital  room?: Total Help needed climbing 3-5 steps with a railing? : Total 6 Click Score: 8    End of Session Equipment Utilized During Treatment: Gait belt Activity Tolerance: Patient limited by pain Patient left: in bed;with call bell/phone within reach Nurse Communication: Mobility status Pain - Right/Left: Left Pain - part of body: Hip     Time: [id number] PT Time Calculation (min) (ACUTE ONLY): 10 min  Charges:  $Therapeutic Activity: 8-22 mins                    Chesley Noon, PTA 09/05/19, 9:44 AM

## 2019-09-05 NOTE — Progress Notes (Signed)
Report called to Nurse at [facility], DNR with patient in packet. VSS , removed IV from LFA. Transported via stretcher without incident

## 2019-09-05 NOTE — TOC Transition Note (Signed)
Transition of Care [facility]) - CM/SW Discharge Note   Patient Details  Name: DEJAE DEWINDT MRN: [id number] Date of Birth: 1921-06-12  Transition of Care [facility]) CM/SW Contact:  Su Hilt, RN Phone Number: 09/05/2019, 2:36 PM   Clinical Narrative:     Patient to DC back to [facility] today via EMS, The bedside nurse to call report, and to call EMS for transport, I notified the nephew Elta [place] of her going back., Packet on the cahrt and the nurse aware  Final next level of care: [place] Barriers to Discharge: Barriers Resolved   Patient Goals and CMS Choice        Discharge Placement              Patient chooses bed at: [facility] Patient to be transferred to facility by: EMS Name of family member notified: [facility] Patient and family notified of of transfer: 09/05/19  Discharge Plan and Services                                     Social Determinants of Health (SDOH) Interventions     Readmission Risk Interventions No flowsheet data found.

## 2019-09-05 NOTE — NC FL2 (Signed)
[place] LEVEL OF CARE SCREENING TOOL     IDENTIFICATION  Patient Name: Kayla Galloway Birthdate: 1921-05-23 Sex: female Admission Date (Current Location): 09/03/2019  [place] and [place] Number:  Engineering geologist and Address:  [facility], [address], [place], [place] [address]      Provider Number: [id number]  Attending Physician Name and Address:  Ezekiel Slocumb, DO  Relative Name and Phone Number:  Milon Dikes [phone]    Current Level of Care: Hospital Recommended Level of Care: [place] Prior Approval Number:    Date Approved/Denied:   PASRR Number: [id number]  Discharge Plan: SNF    Current Diagnoses: Patient Active Problem List   Diagnosis Date Noted  . Closed sacral fracture ([place]) 09/04/2019  . Wrist fracture, closed, right, sequela 09/04/2019  . Pelvic fracture ([place]) 09/03/2019  . Hematoma   . CHF (congestive heart failure) ([place]) 06/21/2019  . Closed fracture of right distal radius 06/21/2019  . Fall 06/21/2019  . Iron deficiency anemia 06/21/2019  . Palliative care encounter   . Syncope 06/20/2019  . Depression   . CAD (coronary artery disease)   . Pulmonary emphysema ([place])   . Acute renal failure superimposed on stage 3a chronic kidney disease ([place])   . Encounter for completion of form with patient 09/26/2018  . Hypoxia 07/21/2018  . Abnormal CXR 07/21/2018  . PAD (peripheral artery disease) ([place]) 04/27/2018  . Fracture of oth parts of pelvis, init for clos fx ([place]) 03/22/2018  . Back pain 03/25/2017  . Hyponatremia 02/09/2017  . Rib fractures 02/04/2017  . GERD (gastroesophageal reflux disease) 02/04/2017  . Skin lesions 11/09/2016  . Bilateral lower extremity edema 11/04/2016  . S/P IVC filter 06/24/2016  . Weight loss 04/11/2014  . Acute lower UTI 04/06/2014  . Anemia 06/28/2012  . Hypertension 06/28/2012  . Hypercholesterolemia 06/28/2012  . Renal insufficiency  06/28/2012  . Osteoporosis 06/28/2012    Orientation RESPIRATION BLADDER Height & Weight     Self, Place, Situation    Incontinent Weight: 54.4 kg Height:  5' (152.4 cm)  BEHAVIORAL SYMPTOMS/MOOD NEUROLOGICAL BOWEL NUTRITION STATUS      Incontinent    AMBULATORY STATUS COMMUNICATION OF NEEDS Skin   Extensive Assist Verbally Normal                       Personal Care Assistance Level of Assistance  Bathing, Dressing Bathing Assistance: Limited assistance   Dressing Assistance: Limited assistance     Functional Limitations Info  Hearing   Hearing Info: Impaired      SPECIAL CARE FACTORS FREQUENCY  PT (By licensed PT)     PT Frequency: 5 times per week              Contractures      Additional Factors Info  Code Status, Allergies Code Status Info: DNR Allergies Info: Cefuroxime Axetil, Doxycycline, Advil , Celebrex , Daypro , Etodolac, Macrobid Nitrofurantoin Monohyd Macro, Penicillins, Percocet Tramadol, Valium Diazepam, Neosporin Neomycin-bacitracin Zn-polymyx, Vicodin Hydrocodone-acetaminophen           Current Medications (09/05/2019):  This is the current hospital active medication list Current Facility-Administered Medications  Medication Dose Route Frequency Provider Last Rate Last Admin  . 0.9 %  sodium chloride infusion   Intravenous Continuous Mansy, Jan A, MD 100 mL/hr at 09/04/19 0024 New Bag at 09/04/19 0024  . acetaminophen (TYLENOL) tablet 650 mg  650 mg Oral Q6H PRN Mansy,  Arvella Merles, MD       Or  . acetaminophen (TYLENOL) suppository 650 mg  650 mg Rectal Q6H PRN Mansy, Arvella Merles, MD      . calcium-vitamin D (OSCAL WITH D) 500-200 MG-UNIT per tablet 1 tablet  1 tablet Oral BID Mansy, Jan A, MD      . ferrous sulfate tablet 325 mg  325 mg Oral BID AC Mansy, Jan A, MD   325 mg at 09/05/19 0840  . heparin injection 5,000 Units  5,000 Units Subcutaneous Q8H Mansy, Arvella Merles, MD   5,000 Units at 09/05/19 0544  . isosorbide dinitrate (ISORDIL) tablet 10 mg   10 mg Oral BID Mansy, Jan A, MD   10 mg at 09/05/19 0840  . labetalol (NORMODYNE) injection 20 mg  20 mg Intravenous Q3H PRN Mansy, Jan A, MD   20 mg at 09/05/19 0842  . magnesium hydroxide (MILK OF MAGNESIA) suspension 30 mL  30 mL Oral Daily PRN Mansy, Jan A, MD      . Melatonin TABS 5 mg  5 mg Oral QHS Mansy, Jan A, MD   5 mg at 09/04/19 2228  . morphine 2 MG/ML injection 1-2 mg  1-2 mg Intravenous Q4H PRN Mansy, Jan A, MD   2 mg at 09/05/19 0842  . multivitamin with minerals tablet   Oral Daily Mansy, Arvella Merles, MD   1 tablet at 09/05/19 0839  . nitroGLYCERIN (NITROSTAT) SL tablet 0.4 mg  0.4 mg Sublingual Q5 min PRN Mansy, Jan A, MD      . ondansetron [facility]) tablet 4 mg  4 mg Oral Q6H PRN Mansy, Jan A, MD       Or  . ondansetron [facility]) injection 4 mg  4 mg Intravenous Q6H PRN Mansy, Jan A, MD      . ondansetron [facility]) tablet 4 mg  4 mg Oral Q6H PRN Mansy, Jan A, MD      . senna-docusate (Senokot-S) tablet 1 tablet  1 tablet Oral Daily Mansy, Jan A, MD   1 tablet at 09/05/19 0839  . sertraline (ZOLOFT) tablet 50 mg  50 mg Oral Daily Mansy, Jan A, MD   50 mg at 09/05/19 0840  . sulfamethoxazole-trimethoprim (BACTRIM) 400-80 MG per tablet 1 tablet  1 tablet Oral Q12H Nicole Kindred A, DO   1 tablet at 09/05/19 0840  . traMADol (ULTRAM) tablet 50 mg  50 mg Oral Q6H PRN Mansy, Jan A, MD      . traZODone (DESYREL) tablet 25 mg  25 mg Oral QHS PRN Mansy, Arvella Merles, MD         Discharge Medications: Please see discharge summary for a list of discharge medications.  Relevant Imaging Results:  Relevant Lab Results:   Additional Information SS# [id number]  Su Hilt, RN

## 2019-09-06 DIAGNOSIS — Z8781 Personal history of (healed) traumatic fracture: Secondary | ICD-10-CM | POA: Diagnosis not present

## 2019-09-06 DIAGNOSIS — I25118 Atherosclerotic heart disease of native coronary artery with other forms of angina pectoris: Secondary | ICD-10-CM | POA: Diagnosis not present

## 2019-09-06 DIAGNOSIS — M8000XD Age-related osteoporosis with current pathological fracture, unspecified site, subsequent encounter for fracture with routine healing: Secondary | ICD-10-CM | POA: Diagnosis not present

## 2019-09-06 DIAGNOSIS — J9611 Chronic respiratory failure with hypoxia: Secondary | ICD-10-CM | POA: Diagnosis not present

## 2019-09-06 LAB — URINE CULTURE: Culture: >=100000 — AB

## 2019-09-10 DIAGNOSIS — M25551 Pain in right hip: Secondary | ICD-10-CM | POA: Diagnosis not present

## 2019-09-10 DIAGNOSIS — M25531 Pain in right wrist: Secondary | ICD-10-CM | POA: Diagnosis not present

## 2019-09-12 DIAGNOSIS — D649 Anemia, unspecified: Secondary | ICD-10-CM | POA: Diagnosis not present

## 2019-09-12 DIAGNOSIS — D500 Iron deficiency anemia secondary to blood loss (chronic): Secondary | ICD-10-CM | POA: Diagnosis not present

## 2019-09-12 DIAGNOSIS — U071 COVID-19: Secondary | ICD-10-CM | POA: Diagnosis not present

## 2019-09-12 DIAGNOSIS — J9611 Chronic respiratory failure with hypoxia: Secondary | ICD-10-CM | POA: Diagnosis not present

## 2019-09-12 DIAGNOSIS — E0590 Thyrotoxicosis, unspecified without thyrotoxic crisis or storm: Secondary | ICD-10-CM | POA: Diagnosis not present

## 2019-09-12 DIAGNOSIS — J439 Emphysema, unspecified: Secondary | ICD-10-CM | POA: Diagnosis not present

## 2019-09-12 DIAGNOSIS — Z9981 Dependence on supplemental oxygen: Secondary | ICD-10-CM | POA: Diagnosis not present

## 2019-09-12 DIAGNOSIS — R0902 Hypoxemia: Secondary | ICD-10-CM | POA: Diagnosis not present

## 2019-09-16 DIAGNOSIS — D649 Anemia, unspecified: Secondary | ICD-10-CM | POA: Diagnosis not present

## 2019-10-05 DIAGNOSIS — Z789 Other specified health status: Secondary | ICD-10-CM | POA: Diagnosis not present

## 2019-10-05 DIAGNOSIS — E440 Moderate protein-calorie malnutrition: Secondary | ICD-10-CM | POA: Diagnosis not present

## 2019-10-05 DIAGNOSIS — J439 Emphysema, unspecified: Secondary | ICD-10-CM | POA: Diagnosis not present

## 2019-10-05 DIAGNOSIS — Z0000 Encounter for general adult medical examination without abnormal findings: Secondary | ICD-10-CM | POA: Diagnosis not present

## 2019-11-28 DIAGNOSIS — J9611 Chronic respiratory failure with hypoxia: Secondary | ICD-10-CM | POA: Diagnosis not present

## 2019-11-28 DIAGNOSIS — Z789 Other specified health status: Secondary | ICD-10-CM | POA: Diagnosis not present

## 2019-11-28 DIAGNOSIS — I25118 Atherosclerotic heart disease of native coronary artery with other forms of angina pectoris: Secondary | ICD-10-CM | POA: Diagnosis not present

## 2019-11-28 DIAGNOSIS — E440 Moderate protein-calorie malnutrition: Secondary | ICD-10-CM | POA: Diagnosis not present

## 2019-12-14 DIAGNOSIS — E441 Mild protein-calorie malnutrition: Secondary | ICD-10-CM | POA: Diagnosis not present

## 2019-12-14 DIAGNOSIS — U071 COVID-19: Secondary | ICD-10-CM | POA: Diagnosis not present

## 2019-12-14 DIAGNOSIS — K219 Gastro-esophageal reflux disease without esophagitis: Secondary | ICD-10-CM | POA: Diagnosis not present

## 2019-12-14 DIAGNOSIS — Z85828 Personal history of other malignant neoplasm of skin: Secondary | ICD-10-CM | POA: Diagnosis not present

## 2020-02-29 DIAGNOSIS — Z789 Other specified health status: Secondary | ICD-10-CM | POA: Diagnosis not present

## 2020-02-29 DIAGNOSIS — J9611 Chronic respiratory failure with hypoxia: Secondary | ICD-10-CM | POA: Diagnosis not present

## 2020-02-29 DIAGNOSIS — I25118 Atherosclerotic heart disease of native coronary artery with other forms of angina pectoris: Secondary | ICD-10-CM | POA: Diagnosis not present

## 2020-02-29 DIAGNOSIS — E440 Moderate protein-calorie malnutrition: Secondary | ICD-10-CM | POA: Diagnosis not present

## 2020-03-23 DIAGNOSIS — E441 Mild protein-calorie malnutrition: Secondary | ICD-10-CM | POA: Diagnosis not present

## 2020-03-23 DIAGNOSIS — K219 Gastro-esophageal reflux disease without esophagitis: Secondary | ICD-10-CM | POA: Diagnosis not present

## 2020-03-23 DIAGNOSIS — Z9181 History of falling: Secondary | ICD-10-CM | POA: Diagnosis not present

## 2020-03-23 DIAGNOSIS — Z85828 Personal history of other malignant neoplasm of skin: Secondary | ICD-10-CM | POA: Diagnosis not present

## 2020-05-07 DIAGNOSIS — E440 Moderate protein-calorie malnutrition: Secondary | ICD-10-CM | POA: Diagnosis not present

## 2020-05-07 DIAGNOSIS — J9611 Chronic respiratory failure with hypoxia: Secondary | ICD-10-CM | POA: Diagnosis not present

## 2020-05-07 DIAGNOSIS — J439 Emphysema, unspecified: Secondary | ICD-10-CM | POA: Diagnosis not present

## 2020-05-17 ENCOUNTER — Telehealth: Payer: Self-pay

## 2020-05-17 NOTE — Telephone Encounter (Signed)
Called the patient to schedule a visit as they have not been seen since 09/22/2018. Call could not be completed and a voicemail could not be left.

## 2020-06-24 ENCOUNTER — Other Ambulatory Visit: Payer: Self-pay

## 2020-06-24 ENCOUNTER — Emergency Department: Payer: No Typology Code available for payment source

## 2020-06-24 ENCOUNTER — Emergency Department
Admission: EM | Admit: 2020-06-24 | Discharge: 2020-06-24 | Disposition: A | Discharge status: Skilled Nursing Facility | Payer: No Typology Code available for payment source | Attending: Emergency Medicine | Admitting: Emergency Medicine

## 2020-06-24 DIAGNOSIS — S61512A Laceration without foreign body of left wrist, initial encounter: Secondary | ICD-10-CM | POA: Insufficient documentation

## 2020-06-24 DIAGNOSIS — I2510 Atherosclerotic heart disease of native coronary artery without angina pectoris: Secondary | ICD-10-CM | POA: Diagnosis not present

## 2020-06-24 DIAGNOSIS — S0083XA Contusion of other part of head, initial encounter: Secondary | ICD-10-CM | POA: Diagnosis not present

## 2020-06-24 DIAGNOSIS — Z85828 Personal history of other malignant neoplasm of skin: Secondary | ICD-10-CM | POA: Insufficient documentation

## 2020-06-24 DIAGNOSIS — R6889 Other general symptoms and signs: Secondary | ICD-10-CM | POA: Diagnosis not present

## 2020-06-24 DIAGNOSIS — Z955 Presence of coronary angioplasty implant and graft: Secondary | ICD-10-CM | POA: Diagnosis not present

## 2020-06-24 DIAGNOSIS — I130 Hypertensive heart and chronic kidney disease with heart failure and stage 1 through stage 4 chronic kidney disease, or unspecified chronic kidney disease: Secondary | ICD-10-CM | POA: Diagnosis not present

## 2020-06-24 DIAGNOSIS — S199XXA Unspecified injury of neck, initial encounter: Secondary | ICD-10-CM | POA: Diagnosis not present

## 2020-06-24 DIAGNOSIS — R0902 Hypoxemia: Secondary | ICD-10-CM | POA: Diagnosis not present

## 2020-06-24 DIAGNOSIS — R279 Unspecified lack of coordination: Secondary | ICD-10-CM | POA: Diagnosis not present

## 2020-06-24 DIAGNOSIS — W19XXXA Unspecified fall, initial encounter: Secondary | ICD-10-CM | POA: Diagnosis not present

## 2020-06-24 DIAGNOSIS — R5381 Other malaise: Secondary | ICD-10-CM | POA: Diagnosis not present

## 2020-06-24 DIAGNOSIS — Z79899 Other long term (current) drug therapy: Secondary | ICD-10-CM | POA: Diagnosis not present

## 2020-06-24 DIAGNOSIS — S01112A Laceration without foreign body of left eyelid and periocular area, initial encounter: Secondary | ICD-10-CM | POA: Diagnosis not present

## 2020-06-24 DIAGNOSIS — W06XXXA Fall from bed, initial encounter: Secondary | ICD-10-CM | POA: Diagnosis not present

## 2020-06-24 DIAGNOSIS — I509 Heart failure, unspecified: Secondary | ICD-10-CM | POA: Diagnosis not present

## 2020-06-24 DIAGNOSIS — Z043 Encounter for examination and observation following other accident: Secondary | ICD-10-CM | POA: Diagnosis not present

## 2020-06-24 DIAGNOSIS — Z743 Need for continuous supervision: Secondary | ICD-10-CM | POA: Diagnosis not present

## 2020-06-24 DIAGNOSIS — Z8781 Personal history of (healed) traumatic fracture: Secondary | ICD-10-CM | POA: Diagnosis not present

## 2020-06-24 DIAGNOSIS — F0390 Unspecified dementia without behavioral disturbance: Secondary | ICD-10-CM | POA: Diagnosis not present

## 2020-06-24 DIAGNOSIS — S0512XA Contusion of eyeball and orbital tissues, left eye, initial encounter: Secondary | ICD-10-CM | POA: Diagnosis not present

## 2020-06-24 DIAGNOSIS — M25562 Pain in left knee: Secondary | ICD-10-CM | POA: Diagnosis not present

## 2020-06-24 DIAGNOSIS — S0993XA Unspecified injury of face, initial encounter: Secondary | ICD-10-CM | POA: Diagnosis present

## 2020-06-24 DIAGNOSIS — M4854XA Collapsed vertebra, not elsewhere classified, thoracic region, initial encounter for fracture: Secondary | ICD-10-CM | POA: Diagnosis not present

## 2020-06-24 DIAGNOSIS — N1831 Chronic kidney disease, stage 3a: Secondary | ICD-10-CM | POA: Insufficient documentation

## 2020-06-24 DIAGNOSIS — Z23 Encounter for immunization: Secondary | ICD-10-CM | POA: Insufficient documentation

## 2020-06-24 DIAGNOSIS — S72141A Displaced intertrochanteric fracture of right femur, initial encounter for closed fracture: Secondary | ICD-10-CM | POA: Diagnosis not present

## 2020-06-24 DIAGNOSIS — S0181XA Laceration without foreign body of other part of head, initial encounter: Secondary | ICD-10-CM

## 2020-06-24 DIAGNOSIS — M4856XA Collapsed vertebra, not elsewhere classified, lumbar region, initial encounter for fracture: Secondary | ICD-10-CM | POA: Diagnosis not present

## 2020-06-24 DIAGNOSIS — M25551 Pain in right hip: Secondary | ICD-10-CM | POA: Diagnosis not present

## 2020-06-24 DIAGNOSIS — I10 Essential (primary) hypertension: Secondary | ICD-10-CM | POA: Diagnosis not present

## 2020-06-24 DIAGNOSIS — S7291XA Unspecified fracture of right femur, initial encounter for closed fracture: Secondary | ICD-10-CM | POA: Diagnosis not present

## 2020-06-24 MED ORDER — LIDOCAINE HCL (PF) 1 % IJ SOLN
INTRAMUSCULAR | Status: AC
  Administered 2020-06-24: 5 mL
  Filled 2020-06-24: qty 5

## 2020-06-24 MED ORDER — TETANUS-DIPHTH-ACELL PERTUSSIS 5-2.5-18.5 LF-MCG/0.5 IM SUSY
0.5000 mL | PREFILLED_SYRINGE | Freq: Once | INTRAMUSCULAR | Status: AC
  Administered 2020-06-24: 0.5 mL via INTRAMUSCULAR
  Filled 2020-06-24: qty 0.5

## 2020-06-24 MED ORDER — ACETAMINOPHEN 500 MG PO TABS
1000.0000 mg | ORAL_TABLET | Freq: Once | ORAL | Status: AC
  Administered 2020-06-24: 1000 mg via ORAL
  Filled 2020-06-24: qty 2

## 2020-06-24 NOTE — ED Triage Notes (Signed)
Pt arrived EMS with c/o of fall, pt from [facility]

## 2020-06-24 NOTE — ED Notes (Signed)
Pt resting comfortably at this time. Waiting on EMS arrival at this time.

## 2020-06-24 NOTE — ED Notes (Signed)
This RN spoke to someone at [facility] who stated nurse was not available. 2nd attempt at report.

## 2020-06-24 NOTE — ED Notes (Signed)
Report to  at [facility]

## 2020-06-24 NOTE — ED Notes (Signed)
7 sutures placed to L eye laceration.

## 2020-06-24 NOTE — ED Notes (Signed)
Pt resting comfortably at this time. No distress noted. Will continue to monitor. VSS.

## 2020-06-24 NOTE — ED Notes (Signed)
Pt resting comfortably at this time. Steri strips applied to L knee and L wrist. No distress noted.

## 2020-06-24 NOTE — ED Notes (Signed)
Pt at CT

## 2020-06-24 NOTE — ED Provider Notes (Signed)
[facility] Emergency Department Provider Note  ____________________________________________   First MD Initiated Contact with Patient 06/24/20 0940     (approximate)  I have reviewed the triage vital signs and the nursing notes.   HISTORY  Chief Complaint Fall   HPI Kayla Galloway is a 86 y.o. female with a past medical history of HTN, CHF, HDL, PVD, dementia, right hip fracture in January 2021 from fall treated nonoperatively, and DVTs status post IVC filter not currently anticoagulated who presents via EMS from [facility] facility for assessment after states she rolled out of bed and hit her head.  Patient endorses little bit of a headache but denies any other acute pain.  She is alert and oriented and able to provide history.  She states she just rolled out of bed this morning.  She is not sure if she lost consciousness.  She denies any chest pain, dental pain, back pain, or extremity pain.  Denies any recent vomiting, diarrhea, dysuria, fevers, cough, chest pain, or any other acute sick symptoms.         Past Medical History:  Diagnosis Date  . Anemia   . Arthritis   . Cancer ([place])    skin ca lesion of scalp- removed  . Depression   . DVT (deep venous thrombosis), left    bilateral, IVC filter 2010  . GERD (gastroesophageal reflux disease)   . Gout   . Hypercholesterolemia   . Hyperkalemia   . Hypertension    Dr. [name]  . Nephrolithiasis   . Obesity   . Osteoporosis   . Peripheral vascular disease ([place])   . 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 Spider veins     Patient Active Problem List   Diagnosis Date Noted  . Closed sacral fracture ([place]) 09/04/2019  . Wrist fracture, closed, right, sequela 09/04/2019  . Pelvic fracture ([place]) 09/03/2019  . Hematoma   . CHF (congestive heart failure) ([place]) 06/21/2019  .  Closed fracture of right distal radius 06/21/2019  . Fall 06/21/2019  . Iron deficiency anemia 06/21/2019  . Palliative care encounter   . Syncope 06/20/2019  . Depression   . CAD (coronary artery disease)   . Pulmonary emphysema ([place])   . Acute renal failure superimposed on stage 3a chronic kidney disease ([place])   . Encounter for completion of form with patient 09/26/2018  . Hypoxia 07/21/2018  . Abnormal CXR 07/21/2018  . PAD (peripheral artery disease) ([place]) 04/27/2018  . Fracture of oth parts of pelvis, init for clos fx ([place]) 03/22/2018  . Back pain 03/25/2017  . Hyponatremia 02/09/2017  . Rib fractures 02/04/2017  . GERD (gastroesophageal reflux disease) 02/04/2017  . Skin lesions 11/09/2016  . Bilateral lower extremity edema 11/04/2016  . S/P IVC filter 06/24/2016  . Weight loss 04/11/2014  . Acute lower UTI 04/06/2014  . Anemia 06/28/2012  . Hypertension 06/28/2012  . Hypercholesterolemia 06/28/2012  . Renal insufficiency 06/28/2012  . Osteoporosis 06/28/2012    Past Surgical History:  Procedure Laterality Date  . CARDIAC CATHETERIZATION     2010 Does not see a cardiac  doctor  . Colonsopy    . DENTAL SURGERY    . ECTOPIC PREGNANCY SURGERY    . EYE SURGERY Bilateral 2011,2012   cataracts  . FRACTURE SURGERY  2009   hip, rod from knee to hip  .  HEMORRHOID SURGERY    . IVC filter Left    pt states it has turned side ways and could not be removed.  Marland Kitchen KYPHOPLASTY  09/03/2012   per patient, she has had kypho x 2:  Surgeon: Winfield Cunas, MD;  Location: [place] [facility];  Service: Neurosurgery;  Laterality: N/A;  Thoracic eight Kyphoplasty  . OPEN REDUCTION INTERNAL FIXATION (ORIF) DISTAL RADIAL FRACTURE Left 09/06/2015   Procedure: OPEN REDUCTION INTERNAL FIXATION (ORIF) DISTAL RADIAL FRACTURE;  Surgeon: Hessie Knows, MD;  Location: [facility] ORS;  Service: Orthopedics;  Laterality: Left;  . RIGHT OOPHORECTOMY     partial-  . SKIN CANCER EXCISION     top of head  .  TONSILLECTOMY     age 9  . VERTEBROPLASTY  12/31/2011   Procedure: VERTEBROPLASTY;  Surgeon: Winfield Cunas, MD;  Location: [place] [facility];  Service: Neurosurgery;  Laterality: N/A;  Thoracic eleven vertebroplasty    Prior to Admission medications   Medication Sig Start Date End Date Taking? Authorizing Provider  acetaminophen (TYLENOL) 650 MG CR tablet Take 650 mg by mouth every 4 (four) hours as needed for pain. Do not exceed 3 grams of APAP/day from all sources   Yes [provider]  Calcium Carb-Cholecalciferol (CALCIUM 500 +D) 500-400 MG-UNIT TABS Take 1 tablet by mouth daily.   Yes [provider]  ferrous sulfate 325 (65 FE) MG tablet Take 325 mg by mouth 2 (two) times daily.    Yes [provider]  Glucosamine-Chondroitin 250-200 MG TABS Take 1 tablet by mouth 2 (two) times daily.    Yes [provider]  isosorbide dinitrate (ISORDIL) 10 MG tablet Take 10 mg by mouth 2 (two) times daily.   Yes [provider]  Melatonin 5 MG TABS Take 5 mg by mouth at bedtime.    Yes [provider]  morphine 20 MG/5ML solution Take 2.5 mg by mouth every hour as needed for pain (shorth of breath).   Yes [provider]  Multiple Vitamins-Minerals (CENTRUM SILVER ADULT 50+ PO) Take 1 tablet by mouth daily.    Yes [provider]  nitroGLYCERIN (NITROSTAT) 0.4 MG SL tablet Place 0.4 mg under the tongue every 5 (five) minutes as needed for chest pain.   Yes [provider]  ondansetron (ZOFRAN) 4 MG tablet Take 4 mg by mouth every 6 (six) hours as needed for nausea or vomiting.   Yes [provider]  oxyCODONE (OXY IR/ROXICODONE) 5 MG immediate release tablet Take 5 mg by mouth every 6 (six) hours as needed for severe pain.   Yes [provider]  oxyCODONE (OXY IR/ROXICODONE) 5 MG immediate release tablet Take 5 mg by mouth 3 (three) times daily.   Yes [provider]  senna-docusate (SENOKOT-S) 8.6-50  MG tablet Take 1 tablet by mouth daily.   Yes [provider]  sertraline (ZOLOFT) 50 MG tablet TAKE 1 TABLET BY MOUTH ONCE DAILY Patient taking differently: Take 50 mg by mouth daily.  10/18/18  Yes [name], MD    Allergies Cefuroxime axetil, Doxycycline, Advil [ibuprofen], Celebrex [celecoxib], Daypro [oxaprozin], Etodolac, Macrobid [nitrofurantoin monohyd macro], Penicillins, Percocet [oxycodone-acetaminophen], Tramadol, Valium [diazepam], Neosporin [neomycin-bacitracin zn-polymyx], and Vicodin [hydrocodone-acetaminophen]  Family History  Problem Relation Age of Onset  . Heart disease Father        myocardial infarction  . Heart disease Brother        myocardial infarction  . Lymphoma Sister   . Anesthesia problems Neg Hx   .  Breast cancer Neg Hx   . Colon cancer Neg Hx     Social History Social History   Tobacco Use  . Smoking status: Never Smoker  . Smokeless tobacco: Never Used  Substance Use Topics  . Alcohol use: No    Alcohol/week: 0.0 standard drinks  . Drug use: No    Review of Systems  Review of Systems  Constitutional: Negative for chills and fever.  HENT: Negative for sore throat.   Eyes: Negative for pain.  Respiratory: Negative for cough and stridor.   Cardiovascular: Negative for chest pain.  Gastrointestinal: Negative for vomiting.  Genitourinary: Negative for dysuria.  Musculoskeletal: Negative for myalgias.  Skin: Negative for rash.  Neurological: Positive for headaches. Negative for seizures and loss of consciousness.  Psychiatric/Behavioral: Negative for suicidal ideas.  All other systems reviewed and are negative.     ____________________________________________   PHYSICAL EXAM:  VITAL SIGNS: ED Triage Vitals  Enc Vitals Group     BP      Pulse      Resp      Temp      Temp src      SpO2      Weight      Height      Head Circumference      Peak Flow      Pain Score      Pain Loc      Pain Edu?      Excl. in  [place]?    Vitals:   06/24/20 1045 06/24/20 1145  BP: (!) 160/66 (!) 158/70  Pulse: 89 89  Resp: 20 20  Temp:    SpO2: 100% 98%   Physical Exam Vitals and nursing note reviewed.  Constitutional:      General: She is not in acute distress.    Appearance: She is well-developed.  HENT:     Head: Normocephalic.     Right Ear: External ear normal.     Left Ear: External ear normal.     Nose: Nose normal.  Eyes:     Conjunctiva/sclera: Conjunctivae normal.  Cardiovascular:     Rate and Rhythm: Normal rate and regular rhythm.     Heart sounds: No murmur heard.   Pulmonary:     Effort: Pulmonary effort is normal. No respiratory distress.     Breath sounds: Normal breath sounds.  Abdominal:     Palpations: Abdomen is soft.     Tenderness: There is no abdominal tenderness.  Musculoskeletal:     Cervical back: Neck supple.     Right hip: Decreased range of motion. Decreased strength.     Left hip: Decreased range of motion. Decreased strength.  Skin:    General: Skin is warm and dry.  Neurological:     Mental Status: She is alert. Mental status is at baseline. She is confused.     Left periorbital ecchymosis.  1 cm laceration over the left eyebrow.  Has a skin tear over the dorsum of the left wrist and the anterior aspect of the left knee.  2+ bilateral radial and DP pulses.  No step-off tenderness deformity over the C/T/L-spine.  Patient has a little bit of ecchymosis and tenderness over the left maxilla but no other significant trauma to the face scalp head or neck.  Patient holds her knees flexed  PERRLA.  EOMI.  Cranial nerves II through XII grossly intact.  Patient is able to grip me with symmetric and strength.  Sensation is intact  light touch of all extremities.  She is able to move her toes on command but is unable to extend her legs or flex or extend at the hips. ____________________________________________   LABS (all labs ordered are listed, but only abnormal results are  displayed)  Labs Reviewed - No data to display ____________________________________________  ____________________________________________  RADIOLOGY  ED MD interpretation: No evidence of pneumothorax or rib fracture on chest x-ray.  No evidence of calvarial skull fracture intracranial hemorrhage or other acute fracture on CT of the face, head, or neck.  There is a left-sided facial laceration and hematoma noted on CT.  No fracture or dislocation on x-rays of patient's left wrist or knee.  Official radiology report(s): DG Chest 1 View  Result Date: 06/24/2020 CLINICAL DATA:  Fall EXAM: CHEST  1 VIEW COMPARISON:  09/03/2019 chest radiograph. FINDINGS: Right rotated chest radiograph. Stable cardiomediastinal silhouette with top-normal heart size. No pneumothorax. No pleural effusion. Lungs appear clear, with no acute consolidative airspace disease and no pulmonary edema. Vertebroplasty material noted within multiple chronic thoracic and lumbar vertebral compression fractures. No displaced fractures in the visualized chest. IMPRESSION: No active cardiopulmonary disease. Electronically Signed   By: Ilona Sorrel M.D.   On: 06/24/2020 11:06   DG Wrist Complete Left  Result Date: 06/24/2020 CLINICAL DATA:  Fall EXAM: LEFT WRIST - COMPLETE 3+ VIEW COMPARISON:  None. FINDINGS: Volar fixation hardware in the distal left radius without evidence of hardware fracture or loosening. No acute osseous fracture. No dislocation. Diffuse osteopenia. Chondrocalcinosis in the TFCC. Moderate first carpometacarpal joint osteoarthritis. No focal osseous lesions. IMPRESSION: 1. No acute osseous fracture or dislocation. 2. Volar fixation hardware in the distal left radius without evidence of hardware complication. 3. Chondrocalcinosis in the TFCC, indicating CPPD arthropathy. Moderate first carpometacarpal joint osteoarthritis. 4. Diffuse osteopenia. Electronically Signed   By: Ilona Sorrel M.D.   On: 06/24/2020 11:11   DG  Knee 2 Views Left  Result Date: 06/24/2020 CLINICAL DATA:  Fall, left knee pain EXAM: LEFT KNEE - 1-2 VIEW COMPARISON:  None. FINDINGS: Partially visualized intramedullary rod with distal interlocking screw in the distal left femur with no evidence of hardware fracture or loosening. Diffuse osteopenia. No left knee joint effusion, dislocation or osseous fracture. No focal osseous lesions. Meniscal chondrocalcinosis. Mild tricompartmental osteoarthritis. Vascular calcifications throughout the posterior soft tissues. IMPRESSION: 1. No left knee joint effusion, dislocation or osseous fracture. 2. Partially visualized fixation hardware in the distal left femur with no evidence of hardware complication. 3. Diffuse osteopenia. 4. Mild tricompartmental left knee osteoarthritis. Meniscal chondrocalcinosis suggests CPPD arthropathy. Electronically Signed   By: Ilona Sorrel M.D.   On: 06/24/2020 11:05   CT Head Wo Contrast  Result Date: 06/24/2020 CLINICAL DATA:  Left frontal laceration.  Head trauma. EXAM: CT HEAD WITHOUT CONTRAST CT MAXILLOFACIAL WITHOUT CONTRAST CT CERVICAL SPINE WITHOUT CONTRAST TECHNIQUE: Multidetector CT imaging of the head, cervical spine, and maxillofacial structures were performed using the standard protocol without intravenous contrast. Multiplanar CT image reconstructions of the cervical spine and maxillofacial structures were also generated. COMPARISON:  None. FINDINGS: CT HEAD FINDINGS Brain: No evidence of acute infarction, hemorrhage, hydrocephalus, extra-axial collection or mass lesion/mass effect. There is mild diffuse low-attenuation within the subcortical and periventricular white matter compatible with chronic microvascular disease. Prominence of the sulci and ventricles compatible with brain atrophy. Vascular: No hyperdense vessel or unexpected calcification. Skull: Normal. Negative for fracture or focal lesion. Other: Left frontal hematoma and laceration identified CT MAXILLOFACIAL  FINDINGS Osseous:  No fracture or mandibular dislocation. No destructive process. Orbits: Negative. No traumatic or inflammatory finding. Sinuses: There is chronic scratch set marked diffuse mucosal thickening and periosteal thickening involving the left maxillary sinus is identified compatible with chronic sinusitis. Partial opacification of the sphenoid sinus noted with air-fluid level. Soft tissues: Left facial and periorbital hematoma identified. CT CERVICAL SPINE FINDINGS Alignment: Anterolisthesis of C3 on C4 is likely related to chronic cervical spondylosis. The alignment of the cervical spine is otherwise unremarkable. Skull base and vertebrae: No acute fracture. No primary bone lesion or focal pathologic process. Soft tissues and spinal canal: No prevertebral fluid or swelling. No visible canal hematoma. Disc levels: Multi level disc space narrowing and endplate spurring identified at C3-4, C4-5, C5-6 and C6-7. Upper chest: Extensive bilateral pleuroparenchymal scarring noted within the imaged portions of the lung apices. Other: None IMPRESSION: 1. No acute intracranial abnormality. 2. Chronic small vessel ischemic change and brain atrophy. 3. Left frontal scalp hematoma and laceration. 4. Left facial and periorbital hematoma. No evidence for facial bone fracture. 5. Chronic left maxillary sinusitis. 6. No evidence for cervical spine fracture or dislocation. 7. Cervical spondylosis. Electronically Signed   By: Kerby Moors M.D.   On: 06/24/2020 11:29   CT Cervical Spine Wo Contrast  Result Date: 06/24/2020 CLINICAL DATA:  Left frontal laceration.  Head trauma. EXAM: CT HEAD WITHOUT CONTRAST CT MAXILLOFACIAL WITHOUT CONTRAST CT CERVICAL SPINE WITHOUT CONTRAST TECHNIQUE: Multidetector CT imaging of the head, cervical spine, and maxillofacial structures were performed using the standard protocol without intravenous contrast. Multiplanar CT image reconstructions of the cervical spine and maxillofacial  structures were also generated. COMPARISON:  None. FINDINGS: CT HEAD FINDINGS Brain: No evidence of acute infarction, hemorrhage, hydrocephalus, extra-axial collection or mass lesion/mass effect. There is mild diffuse low-attenuation within the subcortical and periventricular white matter compatible with chronic microvascular disease. Prominence of the sulci and ventricles compatible with brain atrophy. Vascular: No hyperdense vessel or unexpected calcification. Skull: Normal. Negative for fracture or focal lesion. Other: Left frontal hematoma and laceration identified CT MAXILLOFACIAL FINDINGS Osseous: No fracture or mandibular dislocation. No destructive process. Orbits: Negative. No traumatic or inflammatory finding. Sinuses: There is chronic scratch set marked diffuse mucosal thickening and periosteal thickening involving the left maxillary sinus is identified compatible with chronic sinusitis. Partial opacification of the sphenoid sinus noted with air-fluid level. Soft tissues: Left facial and periorbital hematoma identified. CT CERVICAL SPINE FINDINGS Alignment: Anterolisthesis of C3 on C4 is likely related to chronic cervical spondylosis. The alignment of the cervical spine is otherwise unremarkable. Skull base and vertebrae: No acute fracture. No primary bone lesion or focal pathologic process. Soft tissues and spinal canal: No prevertebral fluid or swelling. No visible canal hematoma. Disc levels: Multi level disc space narrowing and endplate spurring identified at C3-4, C4-5, C5-6 and C6-7. Upper chest: Extensive bilateral pleuroparenchymal scarring noted within the imaged portions of the lung apices. Other: None IMPRESSION: 1. No acute intracranial abnormality. 2. Chronic small vessel ischemic change and brain atrophy. 3. Left frontal scalp hematoma and laceration. 4. Left facial and periorbital hematoma. No evidence for facial bone fracture. 5. Chronic left maxillary sinusitis. 6. No evidence for  cervical spine fracture or dislocation. 7. Cervical spondylosis. Electronically Signed   By: Kerby Moors M.D.   On: 06/24/2020 11:29   DG Pelvis Portable  Result Date: 06/24/2020 CLINICAL DATA:  Fall EXAM: PORTABLE PELVIS 1-2 VIEWS COMPARISON:  09/03/2019 pelvic radiograph FINDINGS: Comminuted impacted angulated intertrochanteric right proximal femur fracture, new  since 09/03/2019 radiograph, although with prominent surrounding hypertrophic calcification. Healed deformities in the inferior pubic rami bilaterally. Ununited superior right pubic ramus fracture. No pelvic diastasis. Vertebroplasty material noted within multiple chronic lumbar vertebral compression fractures. Partially visualized fixation hardware in the proximal left femur, intact. No hip dislocation. IMPRESSION: 1. Comminuted impacted angulated intertrochanteric right proximal femur fracture, new since comparison 09/03/2019 radiograph, although with suggestion of prominent surrounding hypertrophic calcification, which may indicate a subacute/chronic ununited fracture. Consider further evaluation with CT of the right hip. 2. Ununited superior right pubic ramus fracture. 3. Healed deformities in the inferior pubic rami bilaterally. Electronically Signed   By: Ilona Sorrel M.D.   On: 06/24/2020 11:09   CT Hip Right Wo Contrast  Result Date: 06/24/2020 CLINICAL DATA:  Right hip pain after fall. EXAM: CT OF THE RIGHT HIP WITHOUT CONTRAST TECHNIQUE: Multidetector CT imaging of the right hip was performed according to the standard protocol. Multiplanar CT image reconstructions were also generated. COMPARISON:  Pelvis x-ray from same day. CT right hip dated September 03, 2019. FINDINGS: Bones/Joint/Cartilage Subacute impacted and angulated right intertrochanteric femur fracture with resorption along the fracture margins and prominent surrounding callus formation. There is 90 degrees of varus angulation. No dislocation. Chronic nonunited fractures of  the right superior and inferior pubic rami. Chronic healed fractures of the left puboacetabular junction, left inferior pubic ramus, and sacrum. Prior cement augmentation at L5. No joint effusion. Severe osteopenia. Ligaments Ligaments are suboptimally evaluated by CT. Muscles and Tendons Grossly intact.  Gluteus minimus and medius muscle atrophy. Soft tissue No fluid collection or hematoma.  No soft tissue mass. IMPRESSION: 1. Subacute impacted and angulated right intertrochanteric femur fracture. 2. Chronic nonunited fractures of the right superior and inferior pubic rami. Chronic healed fractures of the left puboacetabular junction, left inferior pubic ramus, and sacrum. Electronically Signed   By: Titus Dubin M.D.   On: 06/24/2020 11:56   CT Maxillofacial Wo Contrast  Result Date: 06/24/2020 CLINICAL DATA:  Left frontal laceration.  Head trauma. EXAM: CT HEAD WITHOUT CONTRAST CT MAXILLOFACIAL WITHOUT CONTRAST CT CERVICAL SPINE WITHOUT CONTRAST TECHNIQUE: Multidetector CT imaging of the head, cervical spine, and maxillofacial structures were performed using the standard protocol without intravenous contrast. Multiplanar CT image reconstructions of the cervical spine and maxillofacial structures were also generated. COMPARISON:  None. FINDINGS: CT HEAD FINDINGS Brain: No evidence of acute infarction, hemorrhage, hydrocephalus, extra-axial collection or mass lesion/mass effect. There is mild diffuse low-attenuation within the subcortical and periventricular white matter compatible with chronic microvascular disease. Prominence of the sulci and ventricles compatible with brain atrophy. Vascular: No hyperdense vessel or unexpected calcification. Skull: Normal. Negative for fracture or focal lesion. Other: Left frontal hematoma and laceration identified CT MAXILLOFACIAL FINDINGS Osseous: No fracture or mandibular dislocation. No destructive process. Orbits: Negative. No traumatic or inflammatory finding.  Sinuses: There is chronic scratch set marked diffuse mucosal thickening and periosteal thickening involving the left maxillary sinus is identified compatible with chronic sinusitis. Partial opacification of the sphenoid sinus noted with air-fluid level. Soft tissues: Left facial and periorbital hematoma identified. CT CERVICAL SPINE FINDINGS Alignment: Anterolisthesis of C3 on C4 is likely related to chronic cervical spondylosis. The alignment of the cervical spine is otherwise unremarkable. Skull base and vertebrae: No acute fracture. No primary bone lesion or focal pathologic process. Soft tissues and spinal canal: No prevertebral fluid or swelling. No visible canal hematoma. Disc levels: Multi level disc space narrowing and endplate spurring identified at C3-4, C4-5, C5-6 and C6-7.  Upper chest: Extensive bilateral pleuroparenchymal scarring noted within the imaged portions of the lung apices. Other: None IMPRESSION: 1. No acute intracranial abnormality. 2. Chronic small vessel ischemic change and brain atrophy. 3. Left frontal scalp hematoma and laceration. 4. Left facial and periorbital hematoma. No evidence for facial bone fracture. 5. Chronic left maxillary sinusitis. 6. No evidence for cervical spine fracture or dislocation. 7. Cervical spondylosis. Electronically Signed   By: Kerby Moors M.D.   On: 06/24/2020 11:29    ____________________________________________   PROCEDURES  Procedure(s) performed (including Critical Care):  Marland KitchenMarland KitchenLaceration Repair  Date/Time: 06/24/2020 10:08 AM Performed by: Lucrezia Starch, MD Authorized by: Lucrezia Starch, MD   Consent:    Consent obtained:  Verbal   Consent given by:  Patient   Risks discussed:  Pain, poor cosmetic result and need for additional repair Laceration details:    Location:  Face   Face location:  L eyebrow   Length (cm):  1 Repair type:    Repair type:  Simple Exploration:    Hemostasis achieved with:  Direct pressure   Wound  exploration: wound explored through full range of motion     Contaminated: no   Treatment:    Area cleansed with:  Saline   Amount of cleaning:  Standard   Irrigation solution:  Sterile saline   Irrigation method:  Syringe   Visualized foreign bodies/material removed: no   Skin repair:    Repair method:  Staples and sutures   Suture size:  6-0   Suture material:  Prolene   Number of sutures:  7 Approximation:    Approximation:  Close Post-procedure details:    Dressing:  Open (no dressing)   Patient tolerance of procedure:  Tolerated well, no immediate complications  .1-3 Lead EKG Interpretation Performed by: Lucrezia Starch, MD Authorized by: Lucrezia Starch, MD     Interpretation: normal     ECG rate assessment: normal     Rhythm: sinus rhythm     Ectopy: none     Conduction: normal       ____________________________________________   INITIAL IMPRESSION / ASSESSMENT AND PLAN / ED COURSE        Patient presents with above-stated history exam after she states she rolled out of bed.  On arrival patient is slightly hypertensive with a BP of 165/92 with otherwise stable vital signs on room air.  Patient is oriented to month and year.  She is otherwise at her neurological baseline after was able to reach from staff at her facility who verified above information and patient's neurological baseline.  Laceration over the left eyelid was repaired per procedure note above.  Steri-Strips placed over skin tears over the left wrist and left knee.  Given subacute fracture on CT I did reach out to orthopedist Dr. Sabra Heck and discussed patient's presentation and CT findings.  After reviewing imaging and discussing patient's presentation and diabetes Dr. Sabra Heck states the subacute fracture is nonoperative.  On my assessment patient's pain is adequately controlled.  Given stable vital signs with otherwise patient with well-controlled pain and otherwise reassuring work-up I believe she is  safe for discharge back to her skilled nursing facility where they are able to manage her pain and provide rehab services.  Patient discharged stable condition.  Strict precautions advised and discussed.  ____________________________________________   FINAL CLINICAL IMPRESSION(S) / ED DIAGNOSES  Final diagnoses:  Facial laceration, initial encounter  Facial hematoma, initial encounter  Tear of skin of left  wrist, initial encounter  S/P right hip fracture    Medications  Tdap (BOOSTRIX) injection 0.5 mL (0.5 mLs Intramuscular Given 06/24/20 1003)  lidocaine (PF) (XYLOCAINE) 1 % injection (5 mLs  Given 06/24/20 1004)  acetaminophen (TYLENOL) tablet 1,000 mg (1,000 mg Oral Given 06/24/20 1006)     ED Discharge Orders    None       Note:  This document was prepared using Dragon voice recognition software and may include unintentional dictation errors.   Lucrezia Starch, MD 06/24/20 1215

## 2020-06-24 NOTE — ED Notes (Addendum)
Pt presents to ED via EMS with c/o of having an unwitnessed fall out of bed at [facility]. Pt presents with HX of dementia. Pt has a significant laceration to L side of forehead as well as hematoma below L eye and skin tear to L knee and L wrist.  Pt denies back or neck pain at this time. Pt is orientated to person and EMS states this her baseline. EMS denies LOC. NO blood thinners noted on med rec sent with pt. NAD noted. MD at bedside. Per EMS pt tries to climb out of bed often per staff at facility.

## 2020-06-24 NOTE — ED Notes (Signed)
Attempted to call report to [place] there was no answer at this time.

## 2020-07-19 DIAGNOSIS — Z9181 History of falling: Secondary | ICD-10-CM | POA: Diagnosis not present

## 2020-07-19 DIAGNOSIS — Z85828 Personal history of other malignant neoplasm of skin: Secondary | ICD-10-CM | POA: Diagnosis not present

## 2020-07-19 DIAGNOSIS — K219 Gastro-esophageal reflux disease without esophagitis: Secondary | ICD-10-CM | POA: Diagnosis not present

## 2020-07-19 DIAGNOSIS — E441 Mild protein-calorie malnutrition: Secondary | ICD-10-CM | POA: Diagnosis not present

## 2020-07-20 DIAGNOSIS — J439 Emphysema, unspecified: Secondary | ICD-10-CM | POA: Diagnosis not present

## 2020-07-20 DIAGNOSIS — I25118 Atherosclerotic heart disease of native coronary artery with other forms of angina pectoris: Secondary | ICD-10-CM | POA: Diagnosis not present

## 2020-07-20 DIAGNOSIS — J9611 Chronic respiratory failure with hypoxia: Secondary | ICD-10-CM | POA: Diagnosis not present

## 2020-09-11 DIAGNOSIS — E441 Mild protein-calorie malnutrition: Secondary | ICD-10-CM | POA: Diagnosis not present

## 2020-09-11 DIAGNOSIS — M1990 Unspecified osteoarthritis, unspecified site: Secondary | ICD-10-CM | POA: Diagnosis not present

## 2020-09-11 DIAGNOSIS — K219 Gastro-esophageal reflux disease without esophagitis: Secondary | ICD-10-CM | POA: Diagnosis not present

## 2020-09-11 DIAGNOSIS — M810 Age-related osteoporosis without current pathological fracture: Secondary | ICD-10-CM | POA: Diagnosis not present

## 2020-09-11 DIAGNOSIS — Z9181 History of falling: Secondary | ICD-10-CM | POA: Diagnosis not present

## 2020-09-11 DIAGNOSIS — Z85828 Personal history of other malignant neoplasm of skin: Secondary | ICD-10-CM | POA: Diagnosis not present

## 2020-09-25 DIAGNOSIS — E440 Moderate protein-calorie malnutrition: Secondary | ICD-10-CM | POA: Diagnosis not present

## 2020-09-25 DIAGNOSIS — J9611 Chronic respiratory failure with hypoxia: Secondary | ICD-10-CM | POA: Diagnosis not present

## 2020-09-25 DIAGNOSIS — Z789 Other specified health status: Secondary | ICD-10-CM | POA: Diagnosis not present

## 2020-09-25 DIAGNOSIS — Z0000 Encounter for general adult medical examination without abnormal findings: Secondary | ICD-10-CM | POA: Diagnosis not present

## 2020-09-25 DIAGNOSIS — J439 Emphysema, unspecified: Secondary | ICD-10-CM | POA: Diagnosis not present

## 2020-09-25 DIAGNOSIS — I25118 Atherosclerotic heart disease of native coronary artery with other forms of angina pectoris: Secondary | ICD-10-CM | POA: Diagnosis not present

## 2021-01-03 DIAGNOSIS — J9611 Chronic respiratory failure with hypoxia: Secondary | ICD-10-CM | POA: Diagnosis not present

## 2021-01-03 DIAGNOSIS — E440 Moderate protein-calorie malnutrition: Secondary | ICD-10-CM | POA: Diagnosis not present

## 2021-01-03 DIAGNOSIS — Z789 Other specified health status: Secondary | ICD-10-CM | POA: Diagnosis not present

## 2021-01-03 DIAGNOSIS — J439 Emphysema, unspecified: Secondary | ICD-10-CM | POA: Diagnosis not present

## 2021-02-11 ENCOUNTER — Encounter: Payer: Self-pay | Admitting: Emergency Medicine

## 2021-02-11 ENCOUNTER — Other Ambulatory Visit: Payer: Self-pay

## 2021-02-11 ENCOUNTER — Emergency Department
Admission: EM | Admit: 2021-02-11 | Discharge: 2021-02-11 | Disposition: A | Discharge status: Home / Self Care | Payer: Medicare Other | Attending: Emergency Medicine | Admitting: Emergency Medicine

## 2021-02-11 ENCOUNTER — Emergency Department: Payer: Medicare Other

## 2021-02-11 DIAGNOSIS — X58XXXA Exposure to other specified factors, initial encounter: Secondary | ICD-10-CM | POA: Diagnosis not present

## 2021-02-11 DIAGNOSIS — Z743 Need for continuous supervision: Secondary | ICD-10-CM | POA: Diagnosis not present

## 2021-02-11 DIAGNOSIS — S81811A Laceration without foreign body, right lower leg, initial encounter: Secondary | ICD-10-CM

## 2021-02-11 DIAGNOSIS — I509 Heart failure, unspecified: Secondary | ICD-10-CM | POA: Insufficient documentation

## 2021-02-11 DIAGNOSIS — F0390 Unspecified dementia without behavioral disturbance: Secondary | ICD-10-CM | POA: Insufficient documentation

## 2021-02-11 DIAGNOSIS — S99911A Unspecified injury of right ankle, initial encounter: Secondary | ICD-10-CM | POA: Diagnosis present

## 2021-02-11 DIAGNOSIS — N1831 Chronic kidney disease, stage 3a: Secondary | ICD-10-CM | POA: Diagnosis not present

## 2021-02-11 DIAGNOSIS — R5381 Other malaise: Secondary | ICD-10-CM | POA: Diagnosis not present

## 2021-02-11 DIAGNOSIS — M2550 Pain in unspecified joint: Secondary | ICD-10-CM | POA: Diagnosis not present

## 2021-02-11 DIAGNOSIS — I2510 Atherosclerotic heart disease of native coronary artery without angina pectoris: Secondary | ICD-10-CM | POA: Insufficient documentation

## 2021-02-11 DIAGNOSIS — Z7401 Bed confinement status: Secondary | ICD-10-CM | POA: Diagnosis not present

## 2021-02-11 DIAGNOSIS — R58 Hemorrhage, not elsewhere classified: Secondary | ICD-10-CM | POA: Diagnosis not present

## 2021-02-11 DIAGNOSIS — S91011A Laceration without foreign body, right ankle, initial encounter: Secondary | ICD-10-CM | POA: Insufficient documentation

## 2021-02-11 DIAGNOSIS — I130 Hypertensive heart and chronic kidney disease with heart failure and stage 1 through stage 4 chronic kidney disease, or unspecified chronic kidney disease: Secondary | ICD-10-CM | POA: Insufficient documentation

## 2021-02-11 DIAGNOSIS — D631 Anemia in chronic kidney disease: Secondary | ICD-10-CM | POA: Insufficient documentation

## 2021-02-11 DIAGNOSIS — Z85828 Personal history of other malignant neoplasm of skin: Secondary | ICD-10-CM | POA: Insufficient documentation

## 2021-02-11 MED ORDER — LIDOCAINE-EPINEPHRINE (PF) 2 %-1:200000 IJ SOLN
10.0000 mL | Freq: Once | INTRAMUSCULAR | Status: DC
  Filled 2021-02-11: qty 20

## 2021-02-11 NOTE — ED Triage Notes (Signed)
Presents via EMS from [facility]  presents with skin tear to right foot

## 2021-02-11 NOTE — ED Provider Notes (Signed)
[facility] Emergency Department Provider Note  ____________________________________________   Event Date/Time   First MD Initiated Contact with Patient 02/11/21 1630     (approximate)  I have reviewed the triage vital signs and the nursing notes.   HISTORY  Chief Complaint Laceration  History significant limited by patient's known dementia  HPI Kayla Galloway is a 87 y.o. female who presents to the ER for evaluation of laceration to the medial right ankle.  History is significantly limited by patient's known baseline dementia.  Primary history provided by EMS report from facility.  Patient has known fracture of the femur that is being treated nonoperatively.  Her right leg therefore sits at a awkward figure-of-four angle.  She apparently scraped the medial ankle against something on her bed frame in transition and sustained a laceration.  The patient denies to me any pain anywhere.         Past Medical History:  Diagnosis Date   Anemia    Arthritis    Cancer ([place])    skin ca lesion of scalp- removed   Depression    DVT (deep venous thrombosis), left    bilateral, IVC filter 2010   GERD (gastroesophageal reflux disease)    Gout    Hypercholesterolemia    Hyperkalemia    Hypertension    Dr. 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[facility])    previous renal insufficiency   Retroperitoneal bleed    erosion of IVC filter through inferior vena cava   Spider veins     Patient Active Problem List   Diagnosis Date Noted   Closed sacral fracture ([place]) 09/04/2019   Wrist fracture, closed, right, sequela 09/04/2019   Pelvic fracture ([place]) 09/03/2019   Hematoma    CHF (congestive heart failure) ([place]) 06/21/2019   Closed fracture of right distal radius 06/21/2019   Fall 06/21/2019   Iron deficiency anemia 06/21/2019   Palliative care encounter    Syncope  06/20/2019   Depression    CAD (coronary artery disease)    Pulmonary emphysema ([place])    Acute renal failure superimposed on stage 3a chronic kidney disease ([place])    Encounter for completion of form with patient 09/26/2018   Hypoxia 07/21/2018   Abnormal CXR 07/21/2018   PAD (peripheral artery disease) ([place]) 04/27/2018   Fracture of oth parts of pelvis, init for clos fx ([place]) 03/22/2018   Back pain 03/25/2017   Hyponatremia 02/09/2017   Rib fractures 02/04/2017   GERD (gastroesophageal reflux disease) 02/04/2017   Skin lesions 11/09/2016   Bilateral lower extremity edema 11/04/2016   S/P IVC filter 06/24/2016   Weight loss 04/11/2014   Acute lower UTI 04/06/2014   Anemia 06/28/2012   Hypertension 06/28/2012   Hypercholesterolemia 06/28/2012   Renal insufficiency 06/28/2012   Osteoporosis 06/28/2012    Past Surgical History:  Procedure Laterality Date   CARDIAC CATHETERIZATION     2010 Does not see a cardiac  doctor   [place] Bilateral 2011,2012   cataracts   FRACTURE SURGERY  2009   hip, rod from knee to hip   HEMORRHOID SURGERY     IVC filter Left    pt states it has turned side ways and could not be removed.   KYPHOPLASTY  09/03/2012  per patient, she has had kypho x 2:  Surgeon: Winfield Cunas, MD;  Location: [facility];  Service: Neurosurgery;  Laterality: N/A;  Thoracic eight Kyphoplasty   OPEN REDUCTION INTERNAL FIXATION (ORIF) DISTAL RADIAL FRACTURE Left 09/06/2015   Procedure: OPEN REDUCTION INTERNAL FIXATION (ORIF) DISTAL RADIAL FRACTURE;  Surgeon: Hessie Knows, MD;  Location: [facility] ORS;  Service: Orthopedics;  Laterality: Left;   RIGHT OOPHORECTOMY     partial-   SKIN CANCER EXCISION     top of head   TONSILLECTOMY     age 28   VERTEBROPLASTY  12/31/2011   Procedure: VERTEBROPLASTY;  Surgeon: Winfield Cunas, MD;  Location: [facility];  Service: Neurosurgery;  Laterality: N/A;  Thoracic  eleven vertebroplasty    Prior to Admission medications   Medication Sig Start Date End Date Taking? Authorizing Provider  acetaminophen (TYLENOL) 650 MG CR tablet Take 650 mg by mouth every 4 (four) hours as needed for pain. Do not exceed 3 grams of APAP/day from all sources    [provider]  Calcium Carb-Cholecalciferol (CALCIUM 500 +D) 500-400 MG-UNIT TABS Take 1 tablet by mouth daily.    [provider]  ferrous sulfate 325 (65 FE) MG tablet Take 325 mg by mouth 2 (two) times daily.     [provider]  Glucosamine-Chondroitin 250-200 MG TABS Take 1 tablet by mouth 2 (two) times daily.     [provider]  isosorbide dinitrate (ISORDIL) 10 MG tablet Take 10 mg by mouth 2 (two) times daily.    [provider]  Melatonin 5 MG TABS Take 5 mg by mouth at bedtime.     [provider]  morphine 20 MG/5ML solution Take 2.5 mg by mouth every hour as needed for pain (shorth of breath).    [provider]  Multiple Vitamins-Minerals (CENTRUM SILVER ADULT 50+ PO) Take 1 tablet by mouth daily.     [provider]  nitroGLYCERIN (NITROSTAT) 0.4 MG SL tablet Place 0.4 mg under the tongue every 5 (five) minutes as needed for chest pain.    [provider]  ondansetron (ZOFRAN) 4 MG tablet Take 4 mg by mouth every 6 (six) hours as needed for nausea or vomiting.    [provider]  oxyCODONE (OXY IR/ROXICODONE) 5 MG immediate release tablet Take 5 mg by mouth every 6 (six) hours as needed for severe pain.    [provider]  oxyCODONE (OXY IR/ROXICODONE) 5 MG immediate release tablet Take 5 mg by mouth 3 (three) times daily.    [provider]  senna-docusate (SENOKOT-S) 8.6-50 MG tablet Take 1 tablet by mouth daily.    [provider]  sertraline (ZOLOFT) 50 MG tablet TAKE 1 TABLET BY MOUTH ONCE DAILY Patient taking differently: Take 50 mg by mouth daily.  10/18/18   [name], MD     Allergies Cefuroxime axetil, Doxycycline, Advil [ibuprofen], Celebrex [celecoxib], Daypro [oxaprozin], Etodolac, Macrobid [nitrofurantoin monohyd macro], Penicillins, Percocet [oxycodone-acetaminophen], Tramadol, Valium [diazepam], Neosporin [neomycin-bacitracin zn-polymyx], and Vicodin [hydrocodone-acetaminophen]  Family History  Problem Relation Age of Onset   Heart disease Father        myocardial infarction   Heart disease Brother        myocardial infarction   Lymphoma Sister    Anesthesia problems Neg Hx    Breast cancer Neg Hx    Colon cancer Neg Hx     Social History Social History   Tobacco Use   Smoking status: Never  Smokeless tobacco: Never  Substance Use Topics   Alcohol use: No    Alcohol/week: 0.0 standard drinks   Drug use: No    Review of Systems Constitutional: No fever/chills Eyes: No visual changes. ENT: No sore throat. Cardiovascular: Denies chest pain. Respiratory: Denies shortness of breath. Gastrointestinal: No abdominal pain.  No nausea, no vomiting.  No diarrhea.  No constipation. Genitourinary: Negative for dysuria. Musculoskeletal: + Right ankle laceration Skin: Negative for rash. Neurological: Negative for headaches, focal weakness or numbness.   ____________________________________________   PHYSICAL EXAM:  VITAL SIGNS: ED Triage Vitals  Enc Vitals Group     BP 02/11/21 1629 (!) 159/72     Pulse Rate 02/11/21 1629 84     Resp 02/11/21 1629 18     Temp 02/11/21 1629 98 F (36.7 C)     Temp Source 02/11/21 1629 Oral     SpO2 02/11/21 1629 97 %     Weight 02/11/21 1630 119 lb 0.8 oz (54 kg)     Height 02/11/21 1630 5' (1.524 m)     Head Circumference --      Peak Flow --      Pain Score 02/11/21 1630 0     Pain Loc --      Pain Edu? --      Excl. in [place]? --    Constitutional: Alert and oriented. Well appearing and in no acute distress. Eyes: Conjunctivae are normal. PERRL. EOMI. Head: Atraumatic. Nose: No  congestion/rhinnorhea. Mouth/Throat: Mucous membranes are moist.  Oropharynx non-erythematous. Neck: No stridor.   Cardiovascular: Normal rate, regular rhythm.  Respiratory: Normal respiratory effort.  No retractions.  Gastrointestinal: Soft and nontender. No distention.  Musculoskeletal: Deformity of the right leg sitting in figure-of-four position, no no stable for the patient.  She has a medial laceration just above the right ankle joint that is approximately 4 cm in length.  She is able to actively move the ankle without difficulty and without reported pain.  Dorsal pedal pulse 2+. Neurologic:  Normal speech and language. No gross focal neurologic deficits are appreciated. No gait instability. Skin:  Skin is warm, dry and intact except as described above. No rash noted. Psychiatric: Mood and affect are normal. Speech and behavior are normal.  ____________________________________________  RADIOLOGY I, Marlana Salvage, personally viewed and evaluated these images (plain radiographs) as part of my medical decision making, as well as reviewing the written report by the radiologist.  ED provider interpretation: No acute fracture identified  Official radiology report(s): DG Ankle 2 Views Right  Result Date: 02/11/2021 CLINICAL DATA:  Right ankle laceration. EXAM: RIGHT ANKLE - 2 VIEW COMPARISON:  Right ankle x-rays dated August 16, 2008. FINDINGS: There is no evidence of fracture, dislocation, or joint effusion. There is no evidence of arthropathy or other focal bone abnormality. Osteopenia. Vascular calcifications. No radiopaque foreign body. IMPRESSION: 1. Negative. Electronically Signed   By: Titus Dubin M.D.   On: 02/11/2021 17:52    ____________________________________________   PROCEDURES  Procedure(s) performed (including Critical Care):  Marland KitchenMarland KitchenLaceration Repair  Date/Time: 02/11/2021 10:42 PM Performed by: Marlana Salvage, PA Authorized by: Marlana Salvage, PA    Consent:    Consent obtained:  Verbal   Consent given by:  Patient   Risks, benefits, and alternatives were discussed: yes     Risks discussed:  Infection, need for additional repair, poor wound healing, poor cosmetic result, pain, retained foreign body, tendon damage, vascular damage and nerve damage   Alternatives  discussed:  No treatment Universal protocol:    Procedure explained and questions answered to patient or proxy's satisfaction: yes     Imaging studies available: yes     Patient identity confirmed:  Verbally with patient Anesthesia:    Anesthesia method:  Local infiltration   Local anesthetic:  Lidocaine 1% WITH epi Laceration details:    Location:  Leg   Leg location:  R lower leg   Length (cm):  4   Depth (mm):  5 Pre-procedure details:    Preparation:  Imaging obtained to evaluate for foreign bodies Exploration:    Imaging obtained: x-ray     Imaging outcome: foreign body not noted     Wound exploration: wound explored through full range of motion and entire depth of wound visualized   Treatment:    Area cleansed with:  Povidone-iodine and saline   Amount of cleaning:  Extensive   Irrigation solution:  Sterile saline   Irrigation method:  Syringe and tap Skin repair:    Repair method:  Sutures   Suture size:  4-0   Suture material:  Nylon   Suture technique:  Simple interrupted   Number of sutures:  7 Approximation:    Approximation:  Close Repair type:    Repair type:  Simple Post-procedure details:    Dressing:  Bulky dressing   Procedure completion:  Tolerated well, no immediate complications   ____________________________________________   INITIAL IMPRESSION / ASSESSMENT AND PLAN / ED COURSE  As part of my medical decision making, I reviewed the following data within the [facility] notes reviewed and incorporated, Radiograph reviewed, Evaluated by EM attending Dr. Jari Pigg, and Notes from prior ED visits        Patient  is a 87 year old female with past medical history significant for profound dementia who reports to the emergency department for evaluation of laceration.  See HPI for further details.  In triage patient has grossly normal vital signs.  Physical exam as above.  Patient noted to have 4 cm laceration to the right ankle.  This was repaired, see procedure note.  Patient is stable at this time for transfer back to her facility.  Case discussed with Dr. Jari Pigg who also personally evaluated the patient who agrees with the above plan of care.      ____________________________________________   FINAL CLINICAL IMPRESSION(S) / ED DIAGNOSES  Final diagnoses:  Laceration of right lower extremity, initial encounter     ED Discharge Orders     None        Note:  This document was prepared using Dragon voice recognition software and may include unintentional dictation errors.    Marlana Salvage, PA 02/11/21 2250    Vanessa [place], MD 02/13/21 1055

## 2021-02-11 NOTE — ED Notes (Signed)
EMS at bedside

## 2021-02-11 NOTE — ED Notes (Signed)
Pt discharge with EMS . Pt calm, collective , paperwork given to EMS

## 2021-02-11 NOTE — Discharge Instructions (Addendum)
Please remove stitches in 7-10 days (there are 7 sutures in place. Seek repeat medical evaluation for any signs of infection.

## 2021-02-11 NOTE — ED Notes (Signed)
4x4 gauze and coban placed over the sutured wound, pt tol well. Wrap not applied tight and cap refill within normal limits noted after placed

## 2021-02-13 DIAGNOSIS — I150 Renovascular hypertension: Secondary | ICD-10-CM | POA: Diagnosis not present

## 2021-02-13 DIAGNOSIS — D509 Iron deficiency anemia, unspecified: Secondary | ICD-10-CM | POA: Diagnosis not present

## 2021-08-18 DEATH — deceased

## 2024-07-05 NOTE — Telephone Encounter (Signed)
 Open in error
